# Patient Record
Sex: Female | Born: 1975 | Race: Black or African American | Hispanic: No | Smoking: Never smoker
Health system: Southern US, Community
[De-identification: ages and names within clinical notes are randomized; demographics above are authoritative.]

## PROBLEM LIST (undated history)

## (undated) ENCOUNTER — Inpatient Hospital Stay: Payer: Self-pay

## (undated) DIAGNOSIS — S8290XA Unspecified fracture of unspecified lower leg, initial encounter for closed fracture: Secondary | ICD-10-CM

## (undated) DIAGNOSIS — F4310 Post-traumatic stress disorder, unspecified: Secondary | ICD-10-CM

## (undated) DIAGNOSIS — O09529 Supervision of elderly multigravida, unspecified trimester: Secondary | ICD-10-CM

## (undated) DIAGNOSIS — I10 Essential (primary) hypertension: Secondary | ICD-10-CM

## (undated) DIAGNOSIS — M25512 Pain in left shoulder: Secondary | ICD-10-CM

## (undated) DIAGNOSIS — R519 Headache, unspecified: Secondary | ICD-10-CM

## (undated) DIAGNOSIS — K219 Gastro-esophageal reflux disease without esophagitis: Secondary | ICD-10-CM

## (undated) DIAGNOSIS — O1490 Unspecified pre-eclampsia, unspecified trimester: Secondary | ICD-10-CM

## (undated) DIAGNOSIS — Z9889 Other specified postprocedural states: Secondary | ICD-10-CM

## (undated) DIAGNOSIS — Z641 Problems related to multiparity: Secondary | ICD-10-CM

## (undated) DIAGNOSIS — F329 Major depressive disorder, single episode, unspecified: Secondary | ICD-10-CM

## (undated) DIAGNOSIS — M1990 Unspecified osteoarthritis, unspecified site: Secondary | ICD-10-CM

## (undated) DIAGNOSIS — S62109A Fracture of unspecified carpal bone, unspecified wrist, initial encounter for closed fracture: Secondary | ICD-10-CM

## (undated) DIAGNOSIS — I471 Supraventricular tachycardia, unspecified: Secondary | ICD-10-CM

## (undated) DIAGNOSIS — E119 Type 2 diabetes mellitus without complications: Secondary | ICD-10-CM

## (undated) DIAGNOSIS — J45909 Unspecified asthma, uncomplicated: Secondary | ICD-10-CM

## (undated) DIAGNOSIS — F32A Depression, unspecified: Secondary | ICD-10-CM

## (undated) DIAGNOSIS — R51 Headache: Secondary | ICD-10-CM

## (undated) DIAGNOSIS — R7303 Prediabetes: Secondary | ICD-10-CM

## (undated) HISTORY — PX: CHOLECYSTECTOMY: SHX55

## (undated) HISTORY — DX: Fracture of unspecified carpal bone, unspecified wrist, initial encounter for closed fracture: S62.109A

## (undated) HISTORY — DX: Supervision of elderly multigravida, unspecified trimester: O09.529

## (undated) HISTORY — DX: Supraventricular tachycardia, unspecified: I47.10

## (undated) HISTORY — DX: Unspecified fracture of unspecified lower leg, initial encounter for closed fracture: S82.90XA

## (undated) HISTORY — DX: Supraventricular tachycardia: I47.1

## (undated) HISTORY — PX: TONSILLECTOMY: SUR1361

## (undated) HISTORY — PX: ROTATOR CUFF REPAIR: SHX139

## (undated) HISTORY — DX: Morbid (severe) obesity due to excess calories: E66.01

## (undated) HISTORY — DX: Gastro-esophageal reflux disease without esophagitis: K21.9

## (undated) HISTORY — DX: Post-traumatic stress disorder, unspecified: F43.10

## (undated) HISTORY — DX: Other specified postprocedural states: Z98.890

## (undated) HISTORY — DX: Problems related to multiparity: Z64.1

## (undated) HISTORY — DX: Unspecified pre-eclampsia, unspecified trimester: O14.90

## (undated) HISTORY — PX: KNEE SURGERY: SHX244

## (undated) HISTORY — DX: Unspecified asthma, uncomplicated: J45.909

---
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
## 2000-03-03 DIAGNOSIS — K219 Gastro-esophageal reflux disease without esophagitis: Secondary | ICD-10-CM | POA: Insufficient documentation

## 2009-09-07 ENCOUNTER — Emergency Department: Payer: Self-pay | Admitting: Emergency Medicine

## 2010-01-04 ENCOUNTER — Encounter: Payer: Self-pay | Admitting: Obstetrics and Gynecology

## 2010-01-10 ENCOUNTER — Encounter: Payer: Self-pay | Admitting: Obstetrics and Gynecology

## 2010-02-01 ENCOUNTER — Encounter: Payer: Self-pay | Admitting: Obstetrics & Gynecology

## 2010-03-25 ENCOUNTER — Encounter: Payer: Self-pay | Admitting: Maternal & Fetal Medicine

## 2010-05-08 ENCOUNTER — Observation Stay: Payer: Self-pay | Admitting: Unknown Physician Specialty

## 2010-05-09 ENCOUNTER — Inpatient Hospital Stay: Payer: Self-pay

## 2010-05-23 ENCOUNTER — Emergency Department: Payer: Self-pay | Admitting: Emergency Medicine

## 2011-07-30 ENCOUNTER — Emergency Department: Payer: Self-pay | Admitting: Emergency Medicine

## 2012-01-20 ENCOUNTER — Emergency Department: Payer: Self-pay | Admitting: Emergency Medicine

## 2012-01-20 LAB — COMPREHENSIVE METABOLIC PANEL
Anion Gap: 7 (ref 7–16)
BUN: 14 mg/dL (ref 7–18)
Bilirubin,Total: 0.2 mg/dL (ref 0.2–1.0)
Calcium, Total: 8.9 mg/dL (ref 8.5–10.1)
Co2: 24 mmol/L (ref 21–32)
EGFR (African American): >60
EGFR (Non-African Amer.): >60
Glucose: 101 mg/dL — ABNORMAL HIGH (ref 65–99)
SGOT(AST): 21 U/L (ref 15–37)
SGPT (ALT): 20 U/L
Sodium: 142 mmol/L (ref 136–145)

## 2012-01-20 LAB — CK TOTAL AND CKMB (NOT AT ARMC)
CK, Total: 119 U/L (ref 21–215)
CK-MB: <0.5 ng/mL — ABNORMAL LOW (ref 0.5–3.6)

## 2012-01-20 LAB — CBC
MCH: 25.3 pg — ABNORMAL LOW (ref 26.0–34.0)
MCHC: 31.5 g/dL — ABNORMAL LOW (ref 32.0–36.0)
MCV: 80 fL (ref 80–100)
RDW: 15.8 % — ABNORMAL HIGH (ref 11.5–14.5)
WBC: 11.2 10*3/uL — ABNORMAL HIGH (ref 3.6–11.0)

## 2012-01-20 LAB — PRO B NATRIURETIC PEPTIDE: B-Type Natriuretic Peptide: 59 pg/mL (ref 0–125)

## 2012-03-02 ENCOUNTER — Emergency Department: Payer: Self-pay | Admitting: Emergency Medicine

## 2012-03-02 LAB — TROPONIN I: Troponin-I: <0.02 ng/mL

## 2012-03-02 LAB — CBC
HCT: 38.1 % (ref 35.0–47.0)
HGB: 11.8 g/dL — ABNORMAL LOW (ref 12.0–16.0)
MCH: 25.2 pg — ABNORMAL LOW (ref 26.0–34.0)
WBC: 17.2 10*3/uL — ABNORMAL HIGH (ref 3.6–11.0)

## 2012-03-02 LAB — COMPREHENSIVE METABOLIC PANEL
Albumin: 3.3 g/dL — ABNORMAL LOW (ref 3.4–5.0)
Alkaline Phosphatase: 88 U/L (ref 50–136)
Calcium, Total: 9.1 mg/dL (ref 8.5–10.1)
Co2: 24 mmol/L (ref 21–32)
Creatinine: 1.04 mg/dL (ref 0.60–1.30)
EGFR (African American): >60
EGFR (Non-African Amer.): >60
Glucose: 130 mg/dL — ABNORMAL HIGH (ref 65–99)
Potassium: 3.8 mmol/L (ref 3.5–5.1)
SGPT (ALT): 16 U/L
Sodium: 141 mmol/L (ref 136–145)

## 2012-04-05 ENCOUNTER — Emergency Department: Payer: Self-pay | Admitting: Emergency Medicine

## 2012-05-10 ENCOUNTER — Emergency Department: Payer: Self-pay | Admitting: Emergency Medicine

## 2012-05-11 LAB — CBC WITH DIFFERENTIAL/PLATELET
Basophil #: 0.1 10*3/uL (ref 0.0–0.1)
Basophil %: 0.9 %
Eosinophil #: 0.3 10*3/uL (ref 0.0–0.7)
HGB: 12.1 g/dL (ref 12.0–16.0)
Lymphocyte #: 3.9 10*3/uL — ABNORMAL HIGH (ref 1.0–3.6)
Lymphocyte %: 30.8 %
MCH: 25.4 pg — ABNORMAL LOW (ref 26.0–34.0)
MCHC: 31.2 g/dL — ABNORMAL LOW (ref 32.0–36.0)
Monocyte #: 0.8 x10 3/mm (ref 0.2–0.9)
Neutrophil %: 59.6 %
Platelet: 385 10*3/uL (ref 150–440)
RDW: 16.1 % — ABNORMAL HIGH (ref 11.5–14.5)
WBC: 12.6 10*3/uL — ABNORMAL HIGH (ref 3.6–11.0)

## 2012-05-11 LAB — COMPREHENSIVE METABOLIC PANEL
Albumin: 3.5 g/dL (ref 3.4–5.0)
Anion Gap: 9 (ref 7–16)
BUN: 13 mg/dL (ref 7–18)
Bilirubin,Total: 0.2 mg/dL (ref 0.2–1.0)
Creatinine: 0.74 mg/dL (ref 0.60–1.30)
EGFR (African American): >60
Glucose: 84 mg/dL (ref 65–99)
Osmolality: 282 (ref 275–301)
SGOT(AST): 23 U/L (ref 15–37)
SGPT (ALT): 17 U/L (ref 12–78)
Total Protein: 7.8 g/dL (ref 6.4–8.2)

## 2012-05-12 LAB — BETA STREP CULTURE(ARMC)

## 2012-06-07 ENCOUNTER — Emergency Department: Payer: Self-pay | Admitting: Emergency Medicine

## 2012-06-07 LAB — URINALYSIS, COMPLETE
Bilirubin,UR: NEGATIVE
Glucose,UR: NEGATIVE mg/dL (ref 0–75)
Ketone: NEGATIVE
Nitrite: NEGATIVE
Protein: NEGATIVE
RBC,UR: 1 /HPF (ref 0–5)
Specific Gravity: 1.001 (ref 1.003–1.030)
WBC UR: 2 /HPF (ref 0–5)

## 2012-06-07 LAB — PREGNANCY, URINE: Pregnancy Test, Urine: NEGATIVE m[IU]/mL

## 2012-06-14 ENCOUNTER — Ambulatory Visit: Payer: Self-pay | Admitting: Emergency Medicine

## 2012-06-14 LAB — HEPATIC FUNCTION PANEL A (ARMC)
Alkaline Phosphatase: 73 U/L (ref 50–136)
Bilirubin, Direct: <0.1 mg/dL (ref 0.00–0.20)
Bilirubin,Total: 0.3 mg/dL (ref 0.2–1.0)
SGOT(AST): 15 U/L (ref 15–37)
SGPT (ALT): 19 U/L (ref 12–78)

## 2013-08-06 ENCOUNTER — Ambulatory Visit: Payer: Self-pay | Admitting: Otolaryngology

## 2013-08-13 ENCOUNTER — Ambulatory Visit: Payer: Self-pay | Admitting: Otolaryngology

## 2013-12-08 ENCOUNTER — Emergency Department: Payer: Self-pay | Admitting: Emergency Medicine

## 2013-12-08 LAB — BASIC METABOLIC PANEL
Anion Gap: 5 — ABNORMAL LOW (ref 7–16)
BUN: 16 mg/dL (ref 7–18)
CALCIUM: 8.9 mg/dL (ref 8.5–10.1)
CO2: 24 mmol/L (ref 21–32)
Chloride: 113 mmol/L — ABNORMAL HIGH (ref 98–107)
Creatinine: 1.01 mg/dL (ref 0.60–1.30)
EGFR (African American): >60
EGFR (Non-African Amer.): >60
GLUCOSE: 84 mg/dL (ref 65–99)
OSMOLALITY: 284 (ref 275–301)
POTASSIUM: 3.9 mmol/L (ref 3.5–5.1)
SODIUM: 142 mmol/L (ref 136–145)

## 2013-12-08 LAB — CBC
HCT: 41.5 % (ref 35.0–47.0)
HGB: 13.2 g/dL (ref 12.0–16.0)
MCH: 27.4 pg (ref 26.0–34.0)
MCHC: 31.8 g/dL — ABNORMAL LOW (ref 32.0–36.0)
MCV: 86 fL (ref 80–100)
Platelet: 386 10*3/uL (ref 150–440)
RBC: 4.82 10*6/uL (ref 3.80–5.20)
RDW: 14.8 % — ABNORMAL HIGH (ref 11.5–14.5)
WBC: 15.7 10*3/uL — ABNORMAL HIGH (ref 3.6–11.0)

## 2013-12-08 LAB — TROPONIN I: Troponin-I: 0.04 ng/mL

## 2013-12-08 LAB — D-DIMER(ARMC): D-Dimer: 256 ng/ml

## 2013-12-09 LAB — TROPONIN I: Troponin-I: 0.03 ng/mL

## 2014-03-31 ENCOUNTER — Ambulatory Visit: Payer: Self-pay | Admitting: Physical Medicine and Rehabilitation

## 2014-04-25 ENCOUNTER — Emergency Department: Payer: Self-pay | Admitting: Emergency Medicine

## 2014-04-25 LAB — BASIC METABOLIC PANEL
Anion Gap: 10 (ref 7–16)
BUN: 11 mg/dL (ref 7–18)
CHLORIDE: 107 mmol/L (ref 98–107)
Calcium, Total: 8.5 mg/dL (ref 8.5–10.1)
Co2: 26 mmol/L (ref 21–32)
Creatinine: 0.77 mg/dL (ref 0.60–1.30)
EGFR (African American): >60
GLUCOSE: 90 mg/dL (ref 65–99)
Osmolality: 284 (ref 275–301)
Potassium: 3.6 mmol/L (ref 3.5–5.1)
Sodium: 143 mmol/L (ref 136–145)

## 2014-04-25 LAB — CBC WITH DIFFERENTIAL/PLATELET
Basophil #: 0 10*3/uL (ref 0.0–0.1)
Basophil %: 0.3 %
EOS PCT: 0.9 %
Eosinophil #: 0.1 10*3/uL (ref 0.0–0.7)
HCT: 38.4 % (ref 35.0–47.0)
HGB: 12.4 g/dL (ref 12.0–16.0)
LYMPHS ABS: 4.3 10*3/uL — AB (ref 1.0–3.6)
Lymphocyte %: 34.6 %
MCH: 28 pg (ref 26.0–34.0)
MCHC: 32.4 g/dL (ref 32.0–36.0)
MCV: 87 fL (ref 80–100)
MONO ABS: 0.7 x10 3/mm (ref 0.2–0.9)
Monocyte %: 6 %
NEUTROS PCT: 58.2 %
Neutrophil #: 7.2 10*3/uL — ABNORMAL HIGH (ref 1.4–6.5)
Platelet: 361 10*3/uL (ref 150–440)
RBC: 4.43 10*6/uL (ref 3.80–5.20)
RDW: 14.8 % — ABNORMAL HIGH (ref 11.5–14.5)
WBC: 12.4 10*3/uL — ABNORMAL HIGH (ref 3.6–11.0)

## 2014-04-25 LAB — TROPONIN I: Troponin-I: <0.02 ng/mL

## 2014-05-22 DIAGNOSIS — R52 Pain, unspecified: Secondary | ICD-10-CM

## 2014-05-22 DIAGNOSIS — G8929 Other chronic pain: Secondary | ICD-10-CM | POA: Insufficient documentation

## 2014-05-22 DIAGNOSIS — R51 Headache: Secondary | ICD-10-CM

## 2014-05-22 DIAGNOSIS — R0683 Snoring: Secondary | ICD-10-CM | POA: Insufficient documentation

## 2014-05-22 DIAGNOSIS — R002 Palpitations: Secondary | ICD-10-CM | POA: Insufficient documentation

## 2014-05-22 DIAGNOSIS — R519 Headache, unspecified: Secondary | ICD-10-CM | POA: Insufficient documentation

## 2014-07-11 DIAGNOSIS — I10 Essential (primary) hypertension: Secondary | ICD-10-CM | POA: Insufficient documentation

## 2014-07-18 DIAGNOSIS — M25562 Pain in left knee: Secondary | ICD-10-CM

## 2014-07-18 DIAGNOSIS — Z79891 Long term (current) use of opiate analgesic: Secondary | ICD-10-CM | POA: Insufficient documentation

## 2014-07-18 DIAGNOSIS — M25561 Pain in right knee: Secondary | ICD-10-CM

## 2014-07-18 DIAGNOSIS — F1190 Opioid use, unspecified, uncomplicated: Secondary | ICD-10-CM | POA: Insufficient documentation

## 2014-07-18 DIAGNOSIS — G8929 Other chronic pain: Secondary | ICD-10-CM | POA: Insufficient documentation

## 2014-11-13 DIAGNOSIS — G8929 Other chronic pain: Secondary | ICD-10-CM | POA: Insufficient documentation

## 2014-11-13 DIAGNOSIS — M25511 Pain in right shoulder: Secondary | ICD-10-CM

## 2014-12-01 DIAGNOSIS — M797 Fibromyalgia: Secondary | ICD-10-CM | POA: Insufficient documentation

## 2014-12-01 DIAGNOSIS — K219 Gastro-esophageal reflux disease without esophagitis: Secondary | ICD-10-CM | POA: Insufficient documentation

## 2014-12-01 DIAGNOSIS — N393 Stress incontinence (female) (male): Secondary | ICD-10-CM | POA: Insufficient documentation

## 2014-12-01 DIAGNOSIS — F319 Bipolar disorder, unspecified: Secondary | ICD-10-CM | POA: Insufficient documentation

## 2014-12-01 DIAGNOSIS — F32A Depression, unspecified: Secondary | ICD-10-CM | POA: Insufficient documentation

## 2014-12-01 DIAGNOSIS — F4310 Post-traumatic stress disorder, unspecified: Secondary | ICD-10-CM | POA: Insufficient documentation

## 2014-12-01 DIAGNOSIS — F329 Major depressive disorder, single episode, unspecified: Secondary | ICD-10-CM | POA: Insufficient documentation

## 2014-12-30 NOTE — Op Note (Signed)
PATIENT NAME:  Lauren [name], Lauren [name] MR#:  [id number] DATE OF BIRTH:  19-Jul-1976  DATE OF PROCEDURE:  06/14/2012  PREOPERATIVE DIAGNOSIS: Acute cholecystitis.  POSTOPERATIVE DIAGNOSIS: Acute cholecystitis.  PROCEDURE: Laparoscopic cholecystectomy.   SURGEON: Vella Kohler, MD  HISTORY: This patient was seen by me in my office. She is a very obese black woman and was having right upper quadrant abdominal pain. Preop liver function were normal. The patient had three large stones in the gallbladder. The patient was then brought to surgery.   DESCRIPTION OF PROCEDURE: Under general anesthesia, the abdomen was then prepped and draped. The patient was quite obese so it was difficult to find the fascia. Finally we were able to find the fascia and opened an incision and entered the abdomen with my fingers. After that a Hassan trocar was inserted. After that the camera was put in and another trocar was done in the epigastric region, two 5 mm in the right upper quadrant of the abdomen. The gallbladder was visualized. The patient had a thickened gallbladder. The patient did have a very large liver. It was a difficult dissection but the gallbladder was then lifted up above the liver. The cystic artery was isolated and it was then clipped and cut. The cystic duct was isolated and clipped and cut. The gallbladder was removed from the liver bed. After that there was some oozing from the lymph node on the cystic duct which I put a clip on in the end. I put two Surgicel pieces in to stop the oozing from the liver bed because there was really no peritoneum between the gallbladder and the liver, but final check up with irrigation showed there was almost no bleeding. After the irrigation was good, there was no evidence of any biliary leak or more bleeding. The other trocar was then removed from the abdomen and the gallbladder was also removed with a bag through the umbilical port. The patient tolerated the procedure well. After  this I sutured the         fascia with an extra 0 Vicryl suture and then Marcaine was injected in the skin and skin staples were applied. The patient tolerated the procedure well and was sent to the recovery room in satisfactory condition.  ____________________________ Welford Roche Phylis Bougie, MD msh:slb D: 06/14/2012 14:26:41 ET T: 06/14/2012 14:46:54 ET JOB#: [id number]  cc: Alexus Galka S. Phylis Bougie, MD, <Dictator> Sharene Butters MD ELECTRONICALLY SIGNED 06/28/2012 9:04

## 2015-03-20 DIAGNOSIS — Z3202 Encounter for pregnancy test, result negative: Secondary | ICD-10-CM | POA: Diagnosis not present

## 2015-03-20 DIAGNOSIS — N760 Acute vaginitis: Secondary | ICD-10-CM | POA: Insufficient documentation

## 2015-03-20 DIAGNOSIS — G8929 Other chronic pain: Secondary | ICD-10-CM | POA: Diagnosis not present

## 2015-03-20 DIAGNOSIS — R1030 Lower abdominal pain, unspecified: Secondary | ICD-10-CM | POA: Diagnosis present

## 2015-03-20 LAB — POCT PREGNANCY, URINE: Preg Test, Ur: NEGATIVE

## 2015-03-20 NOTE — ED Notes (Signed)
Patient reports s/s began 2 days ago - reports lower abd cramping, lower back pain and nausea.

## 2015-03-21 ENCOUNTER — Emergency Department
Admission: EM | Admit: 2015-03-21 | Discharge: 2015-03-21 | Disposition: A | Discharge status: Home / Self Care | Payer: Medicaid Other | Attending: Emergency Medicine | Admitting: Emergency Medicine

## 2015-03-21 ENCOUNTER — Emergency Department: Payer: Medicaid Other

## 2015-03-21 DIAGNOSIS — N760 Acute vaginitis: Secondary | ICD-10-CM

## 2015-03-21 DIAGNOSIS — B9689 Other specified bacterial agents as the cause of diseases classified elsewhere: Secondary | ICD-10-CM

## 2015-03-21 DIAGNOSIS — R102 Pelvic and perineal pain: Secondary | ICD-10-CM

## 2015-03-21 LAB — URINALYSIS COMPLETE WITH MICROSCOPIC (ARMC ONLY)
Bilirubin Urine: NEGATIVE
Glucose, UA: NEGATIVE mg/dL
Hgb urine dipstick: NEGATIVE
Leukocytes, UA: NEGATIVE
NITRITE: NEGATIVE
PH: 5 (ref 5.0–8.0)
PROTEIN: NEGATIVE mg/dL
SPECIFIC GRAVITY, URINE: 1.03 (ref 1.005–1.030)

## 2015-03-21 LAB — CHLAMYDIA/NGC RT PCR (ARMC ONLY)
CHLAMYDIA TR: NOT DETECTED
N GONORRHOEAE: NOT DETECTED

## 2015-03-21 LAB — COMPREHENSIVE METABOLIC PANEL
ALBUMIN: 3.5 g/dL (ref 3.5–5.0)
ALK PHOS: 47 U/L (ref 38–126)
ALT: 11 U/L — ABNORMAL LOW (ref 14–54)
ANION GAP: 6 (ref 5–15)
AST: 16 U/L (ref 15–41)
BUN: 12 mg/dL (ref 6–20)
CALCIUM: 8.9 mg/dL (ref 8.9–10.3)
CO2: 26 mmol/L (ref 22–32)
CREATININE: 0.92 mg/dL (ref 0.44–1.00)
Chloride: 108 mmol/L (ref 101–111)
GFR calc non Af Amer: >60 mL/min (ref >60)
GLUCOSE: 102 mg/dL — AB (ref 65–99)
Potassium: 3.6 mmol/L (ref 3.5–5.1)
Sodium: 140 mmol/L (ref 135–145)
Total Bilirubin: 0.3 mg/dL (ref 0.3–1.2)
Total Protein: 7.6 g/dL (ref 6.5–8.1)

## 2015-03-21 LAB — WET PREP, GENITAL
Trich, Wet Prep: NEGATIVE — AB
YEAST WET PREP: NEGATIVE — AB

## 2015-03-21 LAB — CBC WITH DIFFERENTIAL/PLATELET
BASOS ABS: 0 10*3/uL (ref 0–0.1)
BASOS PCT: 0 %
EOS PCT: 1 %
Eosinophils Absolute: 0.1 10*3/uL (ref 0–0.7)
HEMATOCRIT: 36.7 % (ref 35.0–47.0)
Hemoglobin: 11.8 g/dL — ABNORMAL LOW (ref 12.0–16.0)
LYMPHS PCT: 34 %
Lymphs Abs: 4.2 10*3/uL — ABNORMAL HIGH (ref 1.0–3.6)
MCH: 27.1 pg (ref 26.0–34.0)
MCHC: 32.1 g/dL (ref 32.0–36.0)
MCV: 84.5 fL (ref 80.0–100.0)
Monocytes Absolute: 0.6 10*3/uL (ref 0.2–0.9)
Monocytes Relative: 5 %
Neutro Abs: 7.3 10*3/uL — ABNORMAL HIGH (ref 1.4–6.5)
Neutrophils Relative %: 60 %
Platelets: 362 10*3/uL (ref 150–440)
RBC: 4.34 MIL/uL (ref 3.80–5.20)
RDW: 16.8 % — AB (ref 11.5–14.5)
WBC: 12.4 10*3/uL — ABNORMAL HIGH (ref 3.6–11.0)

## 2015-03-21 LAB — PREGNANCY, URINE: PREG TEST UR: NEGATIVE

## 2015-03-21 MED ORDER — METRONIDAZOLE 500 MG PO TABS
ORAL_TABLET | ORAL | Status: AC
  Filled 2015-03-21: qty 1

## 2015-03-21 MED ORDER — ACETAMINOPHEN 500 MG PO TABS
1000.0000 mg | ORAL_TABLET | Freq: Once | ORAL | Status: AC
  Administered 2015-03-21: 1000 mg via ORAL

## 2015-03-21 MED ORDER — METRONIDAZOLE 500 MG PO TABS: 500.0000 mg | ORAL_TABLET | Freq: Two times a day (BID) | ORAL | Status: AC

## 2015-03-21 MED ORDER — METRONIDAZOLE 500 MG PO TABS
500.0000 mg | ORAL_TABLET | Freq: Once | ORAL | Status: AC
  Administered 2015-03-21: 500 mg via ORAL

## 2015-03-21 MED ORDER — ACETAMINOPHEN 500 MG PO TABS
ORAL_TABLET | ORAL | Status: AC
  Filled 2015-03-21: qty 2

## 2015-03-21 NOTE — ED Notes (Signed)
Pt complains of bilateral lower pelvic pain radiating to bilateral lower back. Pt states has had sore bilateral breasts, frequent urination and is concerened she is pregnant. Pt states has had intermittent generalized headache. Pt states pain to pelvis is intermittent and at times is worse with m ovement and is associated with nausea, no vomiting.

## 2015-03-21 NOTE — ED Notes (Signed)
Pt returned from ultrasound. Pt requesting pain medication. md notified, no new orders received.

## 2015-03-21 NOTE — ED Provider Notes (Signed)
[facility] Emergency Department Provider Note  ____________________________________________  Time seen: Approximately 210 AM  I have reviewed the triage vital signs and the nursing notes.   HISTORY  Chief Complaint Abdominal Pain    HPI Lauren [name] is a 39 y.o. female history of chronic pain who presents today with lower abdominal pain for the past 2-3 days. She says that her pain is worse on the left near her pelvis and feels the same as when she had an ectopic pregnancy. She is a G8 P 7. She said she needed surgery for ectopic but still has both her tubes and ovaries.Pain is cramping and worsened with walking. Denies any vaginal bleeding or discharge. Denies any history of sexually transmitted diseases. Says that she normally has a regular period but has not had a period since early May. Says that she also feels some wants in her breast and had an episode of vomiting earlier today. Also having mild diarrhea. No blood noted in her vomit or stool.   No past medical history on file.  There are no active problems to display for this patient.   No past surgical history on file.  No current outpatient prescriptions on file.  Allergies Nucynta  No family history on file.  Social History History  Substance Use Topics  . Smoking status: Not on file  . Smokeless tobacco: Not on file  . Alcohol Use: Not on file    Review of Systems Constitutional: No fever/chills Eyes: No visual changes. ENT: No sore throat. Cardiovascular: Denies chest pain. Respiratory: Denies shortness of breath. Gastrointestinal:   No constipation. Genitourinary: Negative for dysuria. Musculoskeletal: Negative for back pain. Skin: Negative for rash. Neurological: Negative for headaches, focal weakness or numbness.  10-point ROS otherwise negative.  ____________________________________________   PHYSICAL EXAM:  VITAL SIGNS: ED Triage Vitals  Enc Vitals Group     BP  03/20/15 2339 118/64 mmHg     Pulse Rate 03/20/15 2339 82     Resp 03/20/15 2339 20     Temp 03/20/15 2339 98.7 F (37.1 C)     Temp Source 03/20/15 2339 Oral     SpO2 03/20/15 2339 100 %     Weight 03/20/15 2339 280 lb (127.007 kg)     Height 03/20/15 2339 5\' 5"  (1.651 m)     Head Cir --      Peak Flow --      Pain Score 03/20/15 2340 8     Pain Loc --      Pain Edu? --      Excl. in [place]? --     Constitutional: Alert and oriented. Well appearing and in no acute distress. Eyes: Conjunctivae are normal. PERRL. EOMI. Head: Atraumatic. Nose: No congestion/rhinnorhea. Mouth/Throat: Mucous membranes are moist.  Oropharynx non-erythematous. Neck: No stridor.   Cardiovascular: Normal rate, regular rhythm. Grossly normal heart sounds.  Good peripheral circulation. Respiratory: Normal respiratory effort.  No retractions. Lungs CTAB. Gastrointestinal: Soft with mild tenderness to the right and left suprapubic regions. There is no rebound or guarding. No distention. No abdominal bruits. No CVA tenderness. No tenderness over McBurney's point. Genitourinary: Normal external exam. Speculum exam with small amount of white discharge. Bimanual exam with no CMT. No uterine tenderness but with mild left adnexal tenderness. No masses palpated. Musculoskeletal: No lower extremity tenderness nor edema.  No joint effusions. Neurologic:  Normal speech and language. No gross focal neurologic deficits are appreciated. Speech is normal. No gait instability. Skin:  Skin is warm, dry and intact. No rash noted. Psychiatric: Mood and affect are normal. Speech and behavior are normal.  ____________________________________________   LABS (all labs ordered are listed, but only abnormal results are displayed)  Labs Reviewed  WET PREP, GENITAL - Abnormal; Notable for the following:    Yeast Wet Prep HPF POC NEGATIVE (*)    Trich, Wet Prep NEGATIVE (*)    Clue Cells Wet Prep HPF POC MODERATE (*)    WBC, Wet Prep  HPF POC FEW (*)    All other components within normal limits  CBC WITH DIFFERENTIAL/PLATELET - Abnormal; Notable for the following:    WBC 12.4 (*)    Hemoglobin 11.8 (*)    RDW 16.8 (*)    Neutro Abs 7.3 (*)    Lymphs Abs 4.2 (*)    All other components within normal limits  COMPREHENSIVE METABOLIC PANEL - Abnormal; Notable for the following:    Glucose, Bld 102 (*)    ALT 11 (*)    All other components within normal limits  URINALYSIS COMPLETEWITH MICROSCOPIC ([facility] ONLY) - Abnormal; Notable for the following:    Color, Urine YELLOW (*)    APPearance CLEAR (*)    Ketones, ur TRACE (*)    Bacteria, UA RARE (*)    Squamous Epithelial / LPF 6-30 (*)    All other components within normal limits  CHLAMYDIA/NGC RT PCR ([facility] ONLY)  PREGNANCY, URINE  POC URINE PREG, ED  POCT PREGNANCY, URINE   ____________________________________________  EKG   ____________________________________________  RADIOLOGY  Ultrasound is normal although limited because of posterior positioning of the ovaries. ____________________________________________   PROCEDURES    ____________________________________________   INITIAL IMPRESSION / ASSESSMENT AND PLAN / ED COURSE  Pertinent labs & imaging results that were available during my care of the patient were reviewed by me and considered in my medical decision making (see chart for details).  ----------------------------------------- 5:07 AM on 03/21/2015 -----------------------------------------  Patient is asleep and resting comfortably when and to the room. Easily awakened and discussed labs as well as imaging results. Baseline right blood cell count for the patient.  Will treat For bacterial vaginosis. No vomiting in the emergency department. No distress or outward signs of pain. ____________________________________________   FINAL CLINICAL IMPRESSION(S) / ED DIAGNOSES  Final diagnoses:  Pelvic pain in female  Pelvic pain  in female   bacterial vaginosis.    Orbie Pyo, MD 03/21/15 [phone]

## 2015-03-21 NOTE — ED Notes (Signed)
Patient transported to Ultrasound 

## 2015-03-21 NOTE — Discharge Instructions (Signed)
Bacterial Vaginosis Bacterial vaginosis is an infection of the vagina. It happens when too many of certain germs (bacteria) grow in the vagina. HOME CARE  Take your medicine as told by your doctor.  Finish your medicine even if you start to feel better.  Do not have sex until you finish your medicine and are better.  Tell your sex partner that you have an infection. They should see their doctor for treatment.  Practice safe sex. Use condoms. Have only one sex partner. GET HELP IF:  You are not getting better after 3 days of treatment.  You have more grey fluid (discharge) coming from your vagina than before.  You have more pain than before.  You have a fever. MAKE SURE YOU:   Understand these instructions.  Will watch your condition.  Will get help right away if you are not doing well or get worse. Document Released: 06/07/2008 Document Revised: 06/19/2013 Document Reviewed: 04/10/2013 [facility] Patient Information 2015 [place], [place]. This information is not intended to replace advice given to you by your health care provider. Make sure you discuss any questions you have with your health care provider.  Pelvic Pain Female pelvic pain can be caused by many different things and start from a variety of places. Pelvic pain refers to pain that is located in the lower half of the abdomen and between your hips. The pain may occur over a short period of time (acute) or may be reoccurring (chronic). The cause of pelvic pain may be related to disorders affecting the female reproductive organs (gynecologic), but it may also be related to the bladder, kidney stones, an intestinal complication, or muscle or skeletal problems. Getting help right away for pelvic pain is important, especially if there has been severe, sharp, or a sudden onset of unusual pain. It is also important to get help right away because some types of pelvic pain can be life threatening.  CAUSES  Below are only some of the  causes of pelvic pain. The causes of pelvic pain can be in one of several categories.   Gynecologic.  Pelvic inflammatory disease.  Sexually transmitted infection.  Ovarian cyst or a twisted ovarian ligament (ovarian torsion).  Uterine lining that grows outside the uterus (endometriosis).  Fibroids, cysts, or tumors.  Ovulation.  Pregnancy.  Pregnancy that occurs outside the uterus (ectopic pregnancy).  Miscarriage.  Labor.  Abruption of the placenta or ruptured uterus.  Infection.  Uterine infection (endometritis).  Bladder infection.  Diverticulitis.  Miscarriage related to a uterine infection (septic abortion).  Bladder.  Inflammation of the bladder (cystitis).  Kidney stone(s).  Gastrointestinal.  Constipation.  Diverticulitis.  Neurologic.  Trauma.  Feeling pelvic pain because of mental or emotional causes (psychosomatic).  Cancers of the bowel or pelvis. EVALUATION  Your caregiver will want to take a careful history of your concerns. This includes recent changes in your health, a careful gynecologic history of your periods (menses), and a sexual history. Obtaining your family history and medical history is also important. Your caregiver may suggest a pelvic exam. A pelvic exam will help identify the location and severity of the pain. It also helps in the evaluation of which organ system may be involved. In order to identify the cause of the pelvic pain and be properly treated, your caregiver may order tests. These tests may include:   A pregnancy test.  Pelvic ultrasonography.  An X-ray exam of the abdomen.  A urinalysis or evaluation of vaginal discharge.  Blood tests. HOME  CARE INSTRUCTIONS   Only take over-the-counter or prescription medicines for pain, discomfort, or fever as directed by your caregiver.   Rest as directed by your caregiver.   Eat a balanced diet.   Drink enough fluids to make your urine clear or pale yellow, or  as directed.   Avoid sexual intercourse if it causes pain.   Apply warm or cold compresses to the lower abdomen depending on which one helps the pain.   Avoid stressful situations.   Keep a journal of your pelvic pain. Write down when it started, where the pain is located, and if there are things that seem to be associated with the pain, such as food or your menstrual cycle.  Follow up with your caregiver as directed.  SEEK MEDICAL CARE IF:  Your medicine does not help your pain.  You have abnormal vaginal discharge. SEEK IMMEDIATE MEDICAL CARE IF:   You have heavy bleeding from the vagina.   Your pelvic pain increases.   You feel light-headed or faint.   You have chills.   You have pain with urination or blood in your urine.   You have uncontrolled diarrhea or vomiting.   You have a fever or persistent symptoms for more than 3 days.  You have a fever and your symptoms suddenly get worse.   You are being physically or sexually abused.  MAKE SURE YOU:  Understand these instructions.  Will watch your condition.  Will get help if you are not doing well or get worse. Document Released: 07/26/2004 Document Revised: 01/13/2014 Document Reviewed: 12/19/2011 [facility] Patient Information 2015 [place], [place]. This information is not intended to replace advice given to you by your health care provider. Make sure you discuss any questions you have with your health care provider.

## 2015-03-21 NOTE — ED Notes (Signed)
Dr. Clearnce Hasten in to speak to pt.

## 2015-05-07 ENCOUNTER — Encounter: Payer: Self-pay | Admitting: Emergency Medicine

## 2015-05-07 ENCOUNTER — Emergency Department
Admission: EM | Admit: 2015-05-07 | Discharge: 2015-05-07 | Disposition: A | Discharge status: Home / Self Care | Payer: Medicaid Other | Attending: Emergency Medicine | Admitting: Emergency Medicine

## 2015-05-07 DIAGNOSIS — Z79899 Other long term (current) drug therapy: Secondary | ICD-10-CM | POA: Insufficient documentation

## 2015-05-07 DIAGNOSIS — I10 Essential (primary) hypertension: Secondary | ICD-10-CM | POA: Diagnosis not present

## 2015-05-07 DIAGNOSIS — F4321 Adjustment disorder with depressed mood: Secondary | ICD-10-CM | POA: Diagnosis not present

## 2015-05-07 DIAGNOSIS — R42 Dizziness and giddiness: Secondary | ICD-10-CM | POA: Diagnosis present

## 2015-05-07 HISTORY — DX: Essential (primary) hypertension: I10

## 2015-05-07 MED ORDER — LORAZEPAM 1 MG PO TABS
1.0000 mg | ORAL_TABLET | Freq: Once | ORAL | Status: AC
  Administered 2015-05-07: 1 mg via ORAL
  Filled 2015-05-07: qty 1

## 2015-05-07 MED ORDER — IBUPROFEN 600 MG PO TABS
600.0000 mg | ORAL_TABLET | Freq: Once | ORAL | Status: AC
  Administered 2015-05-07: 600 mg via ORAL
  Filled 2015-05-07: qty 1

## 2015-05-07 NOTE — ED Provider Notes (Signed)
[facility] Emergency Department Provider Note  ____________________________________________  Time seen: 1:00 AM  I have reviewed the triage vital signs and the nursing notes.   HISTORY  Chief Complaint Headache; Insomnia; and Dizziness     HPI Lauren [name] is a 39 y.o. female presents with headache dizziness and chest discomfort inability to taste food, not sleeping well feeling hopeless since last Wednesday when she broke up with her boyfriend of 7 months. Patient denies any suicidal or homicidal ideation. Patient states her chest discomfort happens when she cries.    Past Medical History  Diagnosis Date  . Hypertension   . Tachycardia     There are no active problems to display for this patient.   Past Surgical History  Procedure Laterality Date  . Rotator cuff repair    . Knee surgery      Current Outpatient Rx  Name  Route  Sig  Dispense  Refill  . ALPRAZolam (XANAX) 1 MG tablet   Oral   Take 1 tablet by mouth 2 (two) times daily.         Marland Kitchen oxyCODONE-acetaminophen (PERCOCET/ROXICET) 5-325 MG per tablet   Oral   Take 1 tablet by mouth every 8 (eight) hours.         . celecoxib (CELEBREX) 100 MG capsule   Oral   Take 100 mg by mouth 2 (two) times daily.         Marland Kitchen lidocaine (LIDODERM) 5 %   Transdermal   Place 1 patch onto the skin daily.         Marland Kitchen lisinopril-hydrochlorothiazide (PRINZIDE,ZESTORETIC) 20-25 MG per tablet   Oral   Take 1 tablet by mouth daily.           Allergies Nucynta  No family history on file.  Social History Social History  Substance Use Topics  . Smoking status: Never Smoker   . Smokeless tobacco: Never Used  . Alcohol Use: Yes    Review of Systems  Constitutional: Negative for fever. Eyes: Negative for visual changes. ENT: Negative for sore throat. Cardiovascular: Negative for chest pain. Respiratory: Negative for shortness of breath. Gastrointestinal: Negative for abdominal  pain, vomiting and diarrhea. Genitourinary: Negative for dysuria. Musculoskeletal: Negative for back pain. Skin: Negative for rash. Neurological: Negative for headaches, focal weakness or numbness. Psychiatric: Depressed mood  10-point ROS otherwise negative.  ____________________________________________   PHYSICAL EXAM:  VITAL SIGNS: ED Triage Vitals  Enc Vitals Group     BP 05/07/15 0041 110/65 mmHg     Pulse Rate 05/07/15 0041 88     Resp 05/07/15 0041 18     Temp 05/07/15 0041 98.5 F (36.9 C)     Temp Source 05/07/15 0041 Oral     SpO2 05/07/15 0041 93 %     Weight 05/07/15 0041 278 lb (126.1 kg)     Height 05/07/15 0041 5\' 4"  (1.626 m)     Head Cir --      Peak Flow --      Pain Score 05/07/15 0042 8     Pain Loc --      Pain Edu? --      Excl. in [place]? --      Constitutional: Alert and oriented. Well appearing and in no distress. Eyes: Conjunctivae are normal. PERRL. Normal extraocular movements. ENT   Head: Normocephalic and atraumatic.   Nose: No congestion/rhinnorhea.   Mouth/Throat: Mucous membranes are moist.   Neck: No stridor. Hematological/Lymphatic/Immunilogical: No cervical lymphadenopathy.  Cardiovascular: Normal rate, regular rhythm. Normal and symmetric distal pulses are present in all extremities. No murmurs, rubs, or gallops. Respiratory: Normal respiratory effort without tachypnea nor retractions. Breath sounds are clear and equal bilaterally. No wheezes/rales/rhonchi. Gastrointestinal: Soft and nontender. No distention. There is no CVA tenderness. Genitourinary: deferred Musculoskeletal: Nontender with normal range of motion in all extremities. No joint effusions.  No lower extremity tenderness nor edema. Neurologic:  Normal speech and language. No gross focal neurologic deficits are appreciated. Speech is normal.  Skin:  Skin is warm, dry and intact. No rash noted. Psychiatric: Mood and affect are normal. Speech and behavior are  normal. Patient exhibits appropriate insight and judgment.  ____________________________________________      INITIAL IMPRESSION / ASSESSMENT AND PLAN / ED COURSE  Pertinent labs & imaging results that were available during my care of the patient were reviewed by me and considered in my medical decision making (see chart for details).   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

## 2015-05-07 NOTE — Discharge Instructions (Signed)
Adjustment Disorder °Most changes in life can cause stress. Getting used to changes may take a few months or longer. If feelings of stress, hopelessness, or worry continue, you may have an adjustment disorder. This stress-related mental health problem may affect your feelings, thinking and how you act. It occurs in both sexes and happens at any age. °SYMPTOMS  °Some of the following problems may be seen and vary from person to person: °· Sadness or depression. °· Loss of enjoyment. °· Thoughts of suicide. °· Fighting. °· Avoiding family and friends. °· Poor school performance. °· Hopelessness, sense of loss. °· Trouble sleeping. °· Vandalism. °· Worry, weight loss or gain. °· Crying spells. °· Anxiety °· Reckless driving. °· Skipping school. °· Poor work performance. °· Nervousness. °· Ignoring bills. °· Poor attitude. °DIAGNOSIS  °Your caregiver will ask what has happened in your life and do a physical exam. They will make a diagnosis of an adjustment disorder when they are sure another problem or medical illness causing your feelings does not exist. °TREATMENT  °When problems caused by stress interfere with you daily life or last longer than a few months, you may need counseling for an adjustment disorder. Early treatment may diminish problems and help you to better cope with the stressful events in your life. Sometimes medication is necessary. Individual counseling and or support groups can be very helpful. °PROGNOSIS  °Adjustment disorders usually last less than 3 to 6 months. The condition may persist if there is long lasting stress. This could include health problems, relationship problems, or job difficulties where you can not easily escape from what is causing the problem. °PREVENTION  °Even the most mentally healthy, highly functioning people can suffer from an adjustment disorder given a significant blow from a life-changing event. There is no way to prevent pain and loss. Most people need help from time  to time. You are not alone. °SEEK MEDICAL CARE IF:  °Your feelings or symptoms listed above do not improve or worsen. °Document Released: 05/03/2006 Document Revised: 11/21/2011 Document Reviewed: 07/25/2007 °[employer]® Patient Information ©2015 [employer]. This information is not intended to replace advice given to you by your health care provider. Make sure you discuss any questions you have with your health care provider. ° °

## 2015-05-07 NOTE — ED Notes (Addendum)
Pt presents to ED with dizziness, headache, and chest pressure when taking a deep breathing since "earlier in the day" yesterday. Pt states she has been unable to sleep or eat anything and she feels like her mouth is dry. Pt alert and calm at this time with no increased work of breathing or acute distress noted at this time. Pt reports she has been "going through a lot" since last Friday when she and her boyfriend broke up. Symptoms started at that time but have been getting progressively worse. Pt currently alert and calm with no increased work of breathing or acute distress noted at this time. Pt states today she has been feeling hopeless; flat affect currently.

## 2015-06-04 ENCOUNTER — Emergency Department: Payer: Medicaid Other

## 2015-06-04 ENCOUNTER — Encounter: Payer: Self-pay | Admitting: *Deleted

## 2015-06-04 ENCOUNTER — Emergency Department
Admission: EM | Admit: 2015-06-04 | Discharge: 2015-06-04 | Disposition: A | Discharge status: Home / Self Care | Payer: Medicaid Other | Attending: Emergency Medicine | Admitting: Emergency Medicine

## 2015-06-04 DIAGNOSIS — Y9289 Other specified places as the place of occurrence of the external cause: Secondary | ICD-10-CM | POA: Diagnosis not present

## 2015-06-04 DIAGNOSIS — Y9389 Activity, other specified: Secondary | ICD-10-CM | POA: Insufficient documentation

## 2015-06-04 DIAGNOSIS — Z792 Long term (current) use of antibiotics: Secondary | ICD-10-CM | POA: Diagnosis not present

## 2015-06-04 DIAGNOSIS — Y998 Other external cause status: Secondary | ICD-10-CM | POA: Insufficient documentation

## 2015-06-04 DIAGNOSIS — S93602A Unspecified sprain of left foot, initial encounter: Secondary | ICD-10-CM | POA: Diagnosis not present

## 2015-06-04 DIAGNOSIS — Z79899 Other long term (current) drug therapy: Secondary | ICD-10-CM | POA: Diagnosis not present

## 2015-06-04 DIAGNOSIS — Z791 Long term (current) use of non-steroidal anti-inflammatories (NSAID): Secondary | ICD-10-CM | POA: Diagnosis not present

## 2015-06-04 DIAGNOSIS — I10 Essential (primary) hypertension: Secondary | ICD-10-CM | POA: Diagnosis not present

## 2015-06-04 DIAGNOSIS — M79672 Pain in left foot: Secondary | ICD-10-CM | POA: Diagnosis present

## 2015-06-04 DIAGNOSIS — X58XXXA Exposure to other specified factors, initial encounter: Secondary | ICD-10-CM | POA: Insufficient documentation

## 2015-06-04 MED ORDER — IBUPROFEN 800 MG PO TABS
800.0000 mg | ORAL_TABLET | Freq: Once | ORAL | Status: AC
  Administered 2015-06-04: 800 mg via ORAL
  Filled 2015-06-04: qty 1

## 2015-06-04 MED ORDER — IBUPROFEN 800 MG PO TABS: 800.0000 mg | ORAL_TABLET | Freq: Three times a day (TID) | ORAL | Status: DC | PRN

## 2015-06-04 MED ORDER — HYDROCODONE-ACETAMINOPHEN 5-325 MG PO TABS: 1.0000 | ORAL_TABLET | ORAL | Status: DC | PRN

## 2015-06-04 MED ORDER — HYDROCODONE-ACETAMINOPHEN 5-325 MG PO TABS
1.0000 | ORAL_TABLET | Freq: Once | ORAL | Status: AC
  Administered 2015-06-04: 1 via ORAL
  Filled 2015-06-04: qty 1

## 2015-06-04 NOTE — Discharge Instructions (Signed)
Foot Sprain The muscles and cord like structures which attach muscle to bone (tendons) that surround the feet are made up of units. A foot sprain can occur at the weakest spot in any of these units. This condition is most often caused by injury to or overuse of the foot, as from playing contact sports, or aggravating a previous injury, or from poor conditioning, or obesity. SYMPTOMS  Pain with movement of the foot.  Tenderness and swelling at the injury site.  Loss of strength is present in moderate or severe sprains. THE THREE GRADES OR SEVERITY OF FOOT SPRAIN ARE:  Mild (Grade I): Slightly pulled muscle without tearing of muscle or tendon fibers or loss of strength.  Moderate (Grade II): Tearing of fibers in a muscle, tendon, or at the attachment to bone, with small decrease in strength.  Severe (Grade III): Rupture of the muscle-tendon-bone attachment, with separation of fibers. Severe sprain requires surgical repair. Often repeating (chronic) sprains are caused by overuse. Sudden (acute) sprains are caused by direct injury or over-use. DIAGNOSIS  Diagnosis of this condition is usually by your own observation. If problems continue, a caregiver may be required for further evaluation and treatment. X-rays may be required to make sure there are not breaks in the bones (fractures) present. Continued problems may require physical therapy for treatment. PREVENTION  Use strength and conditioning exercises appropriate for your sport.  Warm up properly prior to working out.  Use athletic shoes that are made for the sport you are participating in.  Allow adequate time for healing. Early return to activities makes repeat injury more likely, and can lead to an unstable arthritic foot that can result in prolonged disability. Mild sprains generally heal in 3 to 10 days, with moderate and severe sprains taking 2 to 10 weeks. Your caregiver can help you determine the proper time required for  healing. HOME CARE INSTRUCTIONS   Apply ice to the injury for 15-20 minutes, 03-04 times per day. Put the ice in a plastic bag and place a towel between the bag of ice and your skin.  An elastic wrap (like an Ace bandage) may be used to keep swelling down.  Keep foot above the level of the heart, or at least raised on a footstool, when swelling and pain are present.  Try to avoid use other than gentle range of motion while the foot is painful. Do not resume use until instructed by your caregiver. Then begin use gradually, not increasing use to the point of pain. If pain does develop, decrease use and continue the above measures, gradually increasing activities that do not cause discomfort, until you gradually achieve normal use.  Use crutches if and as instructed, and for the length of time instructed.  Keep injured foot and ankle wrapped between treatments.  Massage foot and ankle for comfort and to keep swelling down. Massage from the toes up towards the knee.  Only take over-the-counter or prescription medicines for pain, discomfort, or fever as directed by your caregiver. SEEK IMMEDIATE MEDICAL CARE IF:   Your pain and swelling increase, or pain is not controlled with medications.  You have loss of feeling in your foot or your foot turns cold or blue.  You develop new, unexplained symptoms, or an increase of the symptoms that brought you to your caregiver. MAKE SURE YOU:   Understand these instructions.  Will watch your condition.  Will get help right away if you are not doing well or get worse. Document Released:  02/18/2002 Document Revised: 11/21/2011 Document Reviewed: 04/17/2008 ExitCare Patient Information 2015 [place], [place]. This information is not intended to replace advice given to you by your health care provider. Make sure you discuss any questions you have with your health care provider.   Take pain medicine as directed. Rest the foot, elevate and use ice to the  area. Contact the orthopedist next week or a podiatrist if not improving. Return to the emergency room for any concerns.

## 2015-06-04 NOTE — ED Notes (Addendum)
Pt has left foot pain with swelling for 2 days.  No known injury.

## 2015-06-04 NOTE — ED Notes (Signed)
States the pain goes up her calf and drives every weekend to [place] and is worried about a blood clot.

## 2015-06-04 NOTE — ED Provider Notes (Signed)
[facility] Emergency Department [place] Note  ____________________________________________  Time seen: Approximately 10:17 PM  I have reviewed the triage vital signs and the nursing notes.   HISTORY  Chief Complaint Foot Pain    HPI Lauren [name] is a 39 y.o. female with history of hypertension who presents with 2 days of left foot pain. She also has some numbness and tingling. No reported injury. She has been wearing tennis shoes. She has noticed some swelling. Pain has radiated at times towards the calf but her Is not hurting currently. Pain is worse with walking. It is on the medial aspect of the left foot.   Past Medical History  Diagnosis Date  . Hypertension   . Tachycardia     There are no active problems to display for this patient.   Past Surgical History  Procedure Laterality Date  . Rotator cuff repair    . Knee surgery      Current Outpatient Rx  Name  Route  Sig  Dispense  Refill  . ALPRAZolam (XANAX) 1 MG tablet   Oral   Take 1 tablet by mouth 2 (two) times daily.         . celecoxib (CELEBREX) 100 MG capsule   Oral   Take 100 mg by mouth 2 (two) times daily.         Marland Kitchen HYDROcodone-acetaminophen (NORCO) 5-325 MG per tablet   Oral   Take 1 tablet by mouth every 4 (four) hours as needed for moderate pain.   20 tablet   0   . ibuprofen (ADVIL,MOTRIN) 800 MG tablet   Oral   Take 1 tablet (800 mg total) by mouth every 8 (eight) hours as needed.   15 tablet   0   . lidocaine (LIDODERM) 5 %   Transdermal   Place 1 patch onto the skin daily.         Marland Kitchen lisinopril-hydrochlorothiazide (PRINZIDE,ZESTORETIC) 20-25 MG per tablet   Oral   Take 1 tablet by mouth daily.         Marland Kitchen oxyCODONE-acetaminophen (PERCOCET/ROXICET) 5-325 MG per tablet   Oral   Take 1 tablet by mouth every 8 (eight) hours.           Allergies Nucynta  No family history on file.  Social History Social History  Substance Use Topics  .  Smoking status: Never Smoker   . Smokeless tobacco: Never Used  . Alcohol Use: No    Review of Systems Constitutional: No fever/chills Eyes: No visual changes. ENT: No sore throat. Cardiovascular: Denies chest pain. Respiratory: Denies shortness of breath. Musculoskeletal: Negative for back pain. Skin: Negative for rash. Neurological:  Has felt some numbness to the left foot and toes.  10-point ROS otherwise negative.  ____________________________________________   PHYSICAL EXAM:  VITAL SIGNS: ED Triage Vitals  Enc Vitals Group     BP 06/04/15 2106 130/50 mmHg     Pulse Rate 06/04/15 2106 83     Resp 06/04/15 2106 20     Temp 06/04/15 2106 98.8 F (37.1 C)     Temp Source 06/04/15 2106 Oral     SpO2 06/04/15 2106 99 %     Weight 06/04/15 2106 287 lb (130.182 kg)     Height 06/04/15 2106 5\' 5"  (1.651 m)     Head Cir --      Peak Flow --      Pain Score 06/04/15 2107 9     Pain Loc --  Pain Edu? --      Excl. in [place]? --      Constitutional: Alert and oriented. Well appearing and in no acute distress. Eyes: Conjunctivae are normal. PERRL. EOMI. Head: Atraumatic. Nose: No congestion/rhinnorhea. Mouth/Throat: Mucous membranes are moist.  Oropharynx non-erythematous. Neck: supple Musculoskeletal: Left foot: No swelling. 2+ DP pulse. Tender over the medial foot and great toe. Also tender to the arch and calcaneus. Tender with range of motion of the foot. Minimal focal joint tenderness. Neurologic:  Normal speech and language. No gross focal neurologic deficits are appreciated. No gait instability. Skin:  Skin is warm, dry and intact. No rash noted. Psychiatric: Mood and affect are normal. Speech and behavior are normal.  ____________________________________________   LABS (all labs ordered are listed, but only abnormal results are displayed)  Labs Reviewed - No data to  display ____________________________________________  EKG   ____________________________________________  RADIOLOGY  Study Result     CLINICAL DATA: Medial LEFT foot pain with swelling for 2 days, no known injury  EXAM: LEFT FOOT - COMPLETE 3+ VIEW  COMPARISON: None  FINDINGS: Osseous mineralization normal.  Joint spaces preserved.  No fracture, dislocation, or bone destruction.  IMPRESSION: Normal exam.   Electronically Signed  By: Lavonia Dana M.D.  On: 06/04/2015 21:28    ____________________________________________   PROCEDURES  Procedure(s) performed: None  Critical Care performed: No  ____________________________________________   INITIAL IMPRESSION / ASSESSMENT AND PLAN / ED COURSE  Pertinent labs & imaging results that were available during my care of the patient were reviewed by me and considered in my medical decision making (see chart for details).  39 year old female with 2 days of left foot pain consistent with a sprain. Negative x-ray as above. Treat with anti-inflammatories, ibuprofen and Norco as needed. She is placed in an Ace wrap, given crutches and encouraged to use ice to the foot. She may follow-up with orthopedist or podiatrist if not improving by next week. ____________________________________________   FINAL CLINICAL IMPRESSION(S) / ED DIAGNOSES  Final diagnoses:  Foot sprain, left, initial encounter      Mortimer Fries, PA-C 06/04/15 2221  Orbie Pyo, MD 06/04/15 [phone]

## 2015-06-24 ENCOUNTER — Encounter: Payer: Self-pay | Admitting: Emergency Medicine

## 2015-06-24 DIAGNOSIS — Z3A01 Less than 8 weeks gestation of pregnancy: Secondary | ICD-10-CM | POA: Insufficient documentation

## 2015-06-24 DIAGNOSIS — O2311 Infections of bladder in pregnancy, first trimester: Secondary | ICD-10-CM | POA: Insufficient documentation

## 2015-06-24 DIAGNOSIS — O10011 Pre-existing essential hypertension complicating pregnancy, first trimester: Secondary | ICD-10-CM | POA: Diagnosis not present

## 2015-06-24 DIAGNOSIS — Z791 Long term (current) use of non-steroidal anti-inflammatories (NSAID): Secondary | ICD-10-CM | POA: Diagnosis not present

## 2015-06-24 DIAGNOSIS — Z79899 Other long term (current) drug therapy: Secondary | ICD-10-CM | POA: Diagnosis not present

## 2015-06-24 DIAGNOSIS — O9989 Other specified diseases and conditions complicating pregnancy, childbirth and the puerperium: Secondary | ICD-10-CM | POA: Diagnosis present

## 2015-06-24 LAB — URINALYSIS COMPLETE WITH MICROSCOPIC (ARMC ONLY)
BILIRUBIN URINE: NEGATIVE
Glucose, UA: NEGATIVE mg/dL
HGB URINE DIPSTICK: NEGATIVE
KETONES UR: NEGATIVE mg/dL
Nitrite: POSITIVE — AB
PH: 5 (ref 5.0–8.0)
Protein, ur: NEGATIVE mg/dL
SPECIFIC GRAVITY, URINE: 1.028 (ref 1.005–1.030)

## 2015-06-24 LAB — CBC
HCT: 35.8 % (ref 35.0–47.0)
HEMOGLOBIN: 11.5 g/dL — AB (ref 12.0–16.0)
MCH: 26.6 pg (ref 26.0–34.0)
MCHC: 32.1 g/dL (ref 32.0–36.0)
MCV: 83.1 fL (ref 80.0–100.0)
PLATELETS: 334 10*3/uL (ref 150–440)
RBC: 4.32 MIL/uL (ref 3.80–5.20)
RDW: 16.3 % — ABNORMAL HIGH (ref 11.5–14.5)
WBC: 12.1 10*3/uL — AB (ref 3.6–11.0)

## 2015-06-24 LAB — HCG, QUANTITATIVE, PREGNANCY: hCG, Beta Chain, Quant, S: 704 m[IU]/mL — ABNORMAL HIGH (ref <5)

## 2015-06-24 NOTE — ED Notes (Addendum)
Patient ambulatory to triage with steady gait, without difficulty or distress noted; pt reports left lower abd pain tonight accomp by vag spotting; st positive home pregnancy test; G8 P7

## 2015-06-25 ENCOUNTER — Emergency Department
Admission: EM | Admit: 2015-06-25 | Discharge: 2015-06-25 | Disposition: A | Discharge status: Home / Self Care | Payer: Medicaid Other | Attending: Emergency Medicine | Admitting: Emergency Medicine

## 2015-06-25 ENCOUNTER — Emergency Department: Payer: Medicaid Other

## 2015-06-25 DIAGNOSIS — Z3491 Encounter for supervision of normal pregnancy, unspecified, first trimester: Secondary | ICD-10-CM

## 2015-06-25 DIAGNOSIS — N3000 Acute cystitis without hematuria: Secondary | ICD-10-CM

## 2015-06-25 MED ORDER — CEPHALEXIN 500 MG PO CAPS
500.0000 mg | ORAL_CAPSULE | Freq: Once | ORAL | Status: AC
  Administered 2015-06-25: 500 mg via ORAL
  Filled 2015-06-25: qty 1

## 2015-06-25 MED ORDER — CEPHALEXIN 500 MG PO CAPS: 500.0000 mg | ORAL_CAPSULE | Freq: Two times a day (BID) | ORAL | Status: AC

## 2015-06-25 NOTE — ED Provider Notes (Signed)
[facility] Emergency Department Provider Note  ____________________________________________  Time seen: 1:30 AM  I have reviewed the triage vital signs and the nursing notes.   HISTORY  Chief Complaint Abdominal Cramping    HPI Lauren [name] is a 39 y.o. female G9P7 1 ectopic pregnancy presents with left-sided abdominal pain with radiation to her back. Patient of note discovered she was pregnant today with a home pregnancy test. Patient admits to nausea and nonbloody emesis. Patient also admits to urinary frequency.    Past Medical History  Diagnosis Date  . Hypertension   . Tachycardia     There are no active problems to display for this patient.   Past Surgical History  Procedure Laterality Date  . Rotator cuff repair    . Knee surgery    . Cesarean section    . Cholecystectomy      Current Outpatient Rx  Name  Route  Sig  Dispense  Refill  . ALPRAZolam (XANAX) 1 MG tablet   Oral   Take 1 tablet by mouth 2 (two) times daily.         . celecoxib (CELEBREX) 100 MG capsule   Oral   Take 100 mg by mouth 2 (two) times daily.         Marland Kitchen HYDROcodone-acetaminophen (NORCO) 5-325 MG per tablet   Oral   Take 1 tablet by mouth every 4 (four) hours as needed for moderate pain.   20 tablet   0   . ibuprofen (ADVIL,MOTRIN) 800 MG tablet   Oral   Take 1 tablet (800 mg total) by mouth every 8 (eight) hours as needed.   15 tablet   0   . lidocaine (LIDODERM) 5 %   Transdermal   Place 1 patch onto the skin daily.         Marland Kitchen lisinopril-hydrochlorothiazide (PRINZIDE,ZESTORETIC) 20-25 MG per tablet   Oral   Take 1 tablet by mouth daily.         Marland Kitchen oxyCODONE-acetaminophen (PERCOCET/ROXICET) 5-325 MG per tablet   Oral   Take 1 tablet by mouth every 8 (eight) hours.           Allergies Nucynta  No family history on file.  Social History Social History  Substance Use Topics  . Smoking status: Never Smoker   . Smokeless  tobacco: Never Used  . Alcohol Use: No    Review of Systems  Constitutional: Negative for fever. Eyes: Negative for visual changes. ENT: Negative for sore throat. Cardiovascular: Negative for chest pain. Respiratory: Negative for shortness of breath. Gastrointestinal: Positive for abdominal pain, negative for vomiting and diarrhea. Positive for nausea Genitourinary: Negative for dysuria. Positive for urinary frequency  Musculoskeletal: Negative for back pain. Skin: Negative for rash. Neurological: Negative for headaches, focal weakness or numbness.   10-point ROS otherwise negative.  ____________________________________________   PHYSICAL EXAM:  VITAL SIGNS: ED Triage Vitals  Enc Vitals Group     BP 06/24/15 2219 163/86 mmHg     Pulse Rate 06/24/15 2219 99     Resp 06/24/15 2219 22     Temp 06/24/15 2219 97.6 F (36.4 C)     Temp Source 06/24/15 2219 Oral     SpO2 06/24/15 2219 100 %     Weight 06/24/15 2219 285 lb (129.275 kg)     Height 06/24/15 2219 5\' 5"  (1.651 m)     Head Cir --      Peak Flow --  Pain Score 06/24/15 2217 9     Pain Loc --      Pain Edu? --      Excl. in [place]? --      Constitutional: Alert and oriented. Well appearing and in no distress. Eyes: Conjunctivae are normal. PERRL. Normal extraocular movements. ENT   Head: Normocephalic and atraumatic.   Nose: No congestion/rhinnorhea.   Mouth/Throat: Mucous membranes are moist.   Neck: No stridor. Hematological/Lymphatic/Immunilogical: No cervical lymphadenopathy. Cardiovascular: Normal rate, regular rhythm. Normal and symmetric distal pulses are present in all extremities. No murmurs, rubs, or gallops. Respiratory: Normal respiratory effort without tachypnea nor retractions. Breath sounds are clear and equal bilaterally. No wheezes/rales/rhonchi. Gastrointestinal: Positive suprapubic and left lower quadrant pain with palpation.. No distention. There is no CVA  tenderness. Genitourinary: deferred Musculoskeletal: Nontender with normal range of motion in all extremities. No joint effusions.  No lower extremity tenderness nor edema. Neurologic:  Normal speech and language. No gross focal neurologic deficits are appreciated. Speech is normal.  Skin:  Skin is warm, dry and intact. No rash noted. Psychiatric: Mood and affect are normal. Speech and behavior are normal. Patient exhibits appropriate insight and judgment.  ____________________________________________    LABS (pertinent positives/negatives)  Labs Reviewed  HCG, QUANTITATIVE, PREGNANCY - Abnormal; Notable for the following:    hCG, Beta Chain, Quant, S 704 (*)    All other components within normal limits  CBC - Abnormal; Notable for the following:    WBC 12.1 (*)    Hemoglobin 11.5 (*)    RDW 16.3 (*)    All other components within normal limits  URINALYSIS COMPLETEWITH MICROSCOPIC ([facility] ONLY) - Abnormal; Notable for the following:    Color, Urine YELLOW (*)    APPearance HAZY (*)    Nitrite POSITIVE (*)    Leukocytes, UA 1+ (*)    Bacteria, UA MANY (*)    Squamous Epithelial / LPF 6-30 (*)    All other components within normal limits  ABO/RH       RADIOLOGY   US OB Comp Less 14 Wks (Final result) Result time: 06/25/15 01:38:23   Final result by Rad Results In Interface (06/25/15 01:38:23)   Narrative:   CLINICAL DATA: Left lower abdominal pain with spotting during pregnancy. Estimated gestational age by LMP is 4 weeks 0 days. Quantitative beta HCG is 704.  EXAM: OBSTETRIC <14 WK [place] AND TRANSVAGINAL OB US  TECHNIQUE: Both transabdominal and transvaginal ultrasound examinations were performed for complete evaluation of the gestation as well as the maternal uterus, adnexal regions, and pelvic cul-de-sac. Transvaginal technique was performed to assess early pregnancy.  COMPARISON: Ultrasound pelvis 03/21/2015  FINDINGS: Intrauterine gestational sac: No  intrauterine gestational sac identified.  Yolk sac: Not identified.  Embryo: Not identified.  Cardiac Activity: Not identified.  Maternal uterus/adnexae: Uterus is anteverted. No myometrial mass lesions identified. There is shadowing across the lower uterine segment which could be artifact of scanning or due to bowel gas. This could also indicate previous C-section scar. Correlation with patient's surgical history is recommended. Endometrial stripe is not abnormally thickened. Ovaries are not visualized but no abnormal adnexal masses are seen. No free fluid in the pelvis.  IMPRESSION: No intrauterine gestational sac, yolk sac, or fetal pole identified. Differential considerations include intrauterine pregnancy too early to be sonographically visualized, missed abortion, or ectopic pregnancy. Followup ultrasound is recommended in 10-14 days for further evaluation.   Electronically Signed By: Lucienne Capers M.D. On: 06/25/2015 01:38  US Ob Transvaginal (Final result) Result time: 06/25/15 01:38:23   Final result by Rad Results In Interface (06/25/15 01:38:23)   Narrative:   CLINICAL DATA: Left lower abdominal pain with spotting during pregnancy. Estimated gestational age by LMP is 4 weeks 0 days. Quantitative beta HCG is 704.  EXAM: OBSTETRIC <14 WK [place] AND TRANSVAGINAL OB US  TECHNIQUE: Both transabdominal and transvaginal ultrasound examinations were performed for complete evaluation of the gestation as well as the maternal uterus, adnexal regions, and pelvic cul-de-sac. Transvaginal technique was performed to assess early pregnancy.  COMPARISON: Ultrasound pelvis 03/21/2015  FINDINGS: Intrauterine gestational sac: No intrauterine gestational sac identified.  Yolk sac: Not identified.  Embryo: Not identified.  Cardiac Activity: Not identified.  Maternal uterus/adnexae: Uterus is anteverted. No myometrial mass lesions identified.  There is shadowing across the lower uterine segment which could be artifact of scanning or due to bowel gas. This could also indicate previous C-section scar. Correlation with patient's surgical history is recommended. Endometrial stripe is not abnormally thickened. Ovaries are not visualized but no abnormal adnexal masses are seen. No free fluid in the pelvis.  IMPRESSION: No intrauterine gestational sac, yolk sac, or fetal pole identified. Differential considerations include intrauterine pregnancy too early to be sonographically visualized, missed abortion, or ectopic pregnancy. Followup ultrasound is recommended in 10-14 days for further evaluation.   Electronically Signed By: Lucienne Capers M.D. On: 06/25/2015 01:38          INITIAL IMPRESSION / ASSESSMENT AND PLAN / ED COURSE  Pertinent labs & imaging results that were available during my care of the patient were reviewed by me and considered in my medical decision making (see chart for details).  History of physical exam consistent with early  pregnancy however given and ability to visualize the intrauterine gestational sac concerning for possible ectopic versus missed abortion is still likely a such patient is advised to follow-up with the OB/GYN approximately 2 days for further outpatient evaluation. Patient also noted to have a urinary tract infection during this emergency department visit for which she was prescribed Keflex.  ____________________________________________   FINAL CLINICAL IMPRESSION(S) / ED DIAGNOSES  Final diagnoses:  First trimester pregnancy  Acute cystitis without hematuria      Gregor Hams, MD 06/26/15 [phone]

## 2015-06-25 NOTE — Discharge Instructions (Signed)
First Trimester of Pregnancy  The first trimester of pregnancy is from week 1 until the end of week 12 (months 1 through 3). A week after a sperm fertilizes an egg, the egg will implant on the wall of the uterus. This embryo will begin to develop into a baby. Genes from you and your partner are forming the baby. The female genes determine whether the baby is a boy or a girl. At 6-8 weeks, the eyes and face are formed, and the heartbeat can be seen on ultrasound. At the end of 12 weeks, all the baby's organs are formed.   Now that you are pregnant, you will want to do everything you can to have a healthy baby. Two of the most important things are to get good prenatal care and to follow your health care provider's instructions. Prenatal care is all the medical care you receive before the baby's birth. This care will help prevent, find, and treat any problems during the pregnancy and childbirth.  BODY CHANGES  Your body goes through many changes during pregnancy. The changes vary from woman to woman.   · You may gain or lose a couple of pounds at first.  · You may feel sick to your stomach (nauseous) and throw up (vomit). If the vomiting is uncontrollable, call your health care provider.  · You may tire easily.  · You may develop headaches that can be relieved by medicines approved by your health care provider.  · You may urinate more often. Painful urination may mean you have a bladder infection.  · You may develop heartburn as a result of your pregnancy.  · You may develop constipation because certain hormones are causing the muscles that push waste through your intestines to slow down.  · You may develop hemorrhoids or swollen, bulging veins (varicose veins).  · Your breasts may begin to grow larger and become tender. Your nipples may stick out more, and the tissue that surrounds them (areola) may become darker.  · Your gums may bleed and may be sensitive to brushing and flossing.   · Dark spots or blotches (chloasma, mask of pregnancy) may develop on your face. This will likely fade after the baby is born.  · Your menstrual periods will stop.  · You may have a loss of appetite.  · You may develop cravings for certain kinds of food.  · You may have changes in your emotions from day to day, such as being excited to be pregnant or being concerned that something may go wrong with the pregnancy and baby.  · You may have more vivid and strange dreams.  · You may have changes in your hair. These can include thickening of your hair, rapid growth, and changes in texture. Some women also have hair loss during or after pregnancy, or hair that feels dry or thin. Your hair will most likely return to normal after your baby is born.  WHAT TO EXPECT AT YOUR PRENATAL VISITS  During a routine prenatal visit:  · You will be weighed to make sure you and the baby are growing normally.  · Your blood pressure will be taken.  · Your abdomen will be measured to track your baby's growth.  · The fetal heartbeat will be listened to starting around week 10 or 12 of your pregnancy.  · Test results from any previous visits will be discussed.  Your health care provider may ask you:  · How you are feeling.  · If you   including cigarettes, chewing tobacco, and electronic cigarettes.  If you have any questions. Other tests that may be performed during your first trimester include:  Blood tests to find your blood type and to check for the presence of any previous infections. They will also be used to check for low iron levels (anemia) and Rh antibodies. Later in the pregnancy, blood tests for diabetes will be done along with other tests if problems develop.  Urine tests to check for infections, diabetes, or protein in the urine.  An ultrasound to confirm the proper growth  and development of the baby.  An amniocentesis to check for possible genetic problems.  Fetal screens for spina bifida and Down syndrome.  You may need other tests to make sure you and the baby are doing well.  HIV (human immunodeficiency virus) testing. Routine prenatal testing includes screening for HIV, unless you choose not to have this test. HOME CARE INSTRUCTIONS  Medicines  Follow your health care provider's instructions regarding medicine use. Specific medicines may be either safe or unsafe to take during pregnancy.  Take your prenatal vitamins as directed.  If you develop constipation, try taking a stool softener if your health care provider approves. Diet  Eat regular, well-balanced meals. Choose a variety of foods, such as meat or vegetable-based protein, fish, milk and low-fat dairy products, vegetables, fruits, and whole grain breads and cereals. Your health care provider will help you determine the amount of weight gain that is right for you.  Avoid raw meat and uncooked cheese. These carry germs that can cause birth defects in the baby.  Eating four or five small meals rather than three large meals a day may help relieve nausea and vomiting. If you start to feel nauseous, eating a few soda crackers can be helpful. Drinking liquids between meals instead of during meals also seems to help nausea and vomiting.  If you develop constipation, eat more high-fiber foods, such as fresh vegetables or fruit and whole grains. Drink enough fluids to keep your urine clear or pale yellow. Activity and Exercise  Exercise only as directed by your health care provider. Exercising will help you:  Control your weight.  Stay in shape.  Be prepared for labor and delivery.  Experiencing pain or cramping in the lower abdomen or low back is a good sign that you should stop exercising. Check with your health care provider before continuing normal exercises.  Try to avoid standing for long  periods of time. Move your legs often if you must stand in one place for a long time.  Avoid heavy lifting.  Wear low-heeled shoes, and practice good posture.  You may continue to have sex unless your health care provider directs you otherwise. Relief of Pain or Discomfort  Wear a good support bra for breast tenderness.   Take warm sitz baths to soothe any pain or discomfort caused by hemorrhoids. Use hemorrhoid cream if your health care provider approves.   Rest with your legs elevated if you have leg cramps or low back pain.  If you develop varicose veins in your legs, wear support hose. Elevate your feet for 15 minutes, 3-4 times a day. Limit salt in your diet. Prenatal Care  Schedule your prenatal visits by the twelfth week of pregnancy. They are usually scheduled monthly at first, then more often in the last 2 months before delivery.  Write down your questions. Take them to your prenatal visits.  Keep all your prenatal visits as directed by your  health care provider. Safety  Wear your seat belt at all times when driving.  Make a list of emergency phone numbers, including numbers for family, friends, the hospital, and police and fire departments. General Tips  Ask your health care provider for a referral to a local prenatal education class. Begin classes no later than at the beginning of month 6 of your pregnancy.  Ask for help if you have counseling or nutritional needs during pregnancy. Your health care provider can offer advice or refer you to specialists for help with various needs.  Do not use hot tubs, steam rooms, or saunas.  Do not douche or use tampons or scented sanitary pads.  Do not cross your legs for long periods of time.  Avoid cat litter boxes and soil used by cats. These carry germs that can cause birth defects in the baby and possibly loss of the fetus by miscarriage or stillbirth.  Avoid all smoking, herbs, alcohol, and medicines not prescribed by  your health care provider. Chemicals in these affect the formation and growth of the baby.  Do not use any tobacco products, including cigarettes, chewing tobacco, and electronic cigarettes. If you need help quitting, ask your health care provider. You may receive counseling support and other resources to help you quit.  Schedule a dentist appointment. At home, brush your teeth with a soft toothbrush and be gentle when you floss. SEEK MEDICAL CARE IF:   You have dizziness.  You have mild pelvic cramps, pelvic pressure, or nagging pain in the abdominal area.  You have persistent nausea, vomiting, or diarrhea.  You have a bad smelling vaginal discharge.  You have pain with urination.  You notice increased swelling in your face, hands, legs, or ankles. SEEK IMMEDIATE MEDICAL CARE IF:   You have a fever.  You are leaking fluid from your vagina.  You have spotting or bleeding from your vagina.  You have severe abdominal cramping or pain.  You have rapid weight gain or loss.  You vomit blood or material that looks like coffee grounds.  You are exposed to [place] measles and have never had them.  You are exposed to fifth disease or chickenpox.  You develop a severe headache.  You have shortness of breath.  You have any kind of trauma, such as from a fall or a car accident.   This information is not intended to replace advice given to you by your health care provider. Make sure you discuss any questions you have with your health care provider.   Document Released: 08/23/2001 Document Revised: 09/19/2014 Document Reviewed: 07/09/2013 Elsevier Interactive Patient Education 2016 [employer]  Urinary Tract Infection Urinary tract infections (UTIs) can develop anywhere along your urinary tract. Your urinary tract is your body's drainage system for removing wastes and extra water. Your urinary tract includes two kidneys, two ureters, a bladder, and a urethra. Your kidneys are a  pair of bean-shaped organs. Each kidney is about the size of your fist. They are located below your ribs, one on each side of your spine. CAUSES Infections are caused by microbes, which are microscopic organisms, including fungi, viruses, and bacteria. These organisms are so small that they can only be seen through a microscope. Bacteria are the microbes that most commonly cause UTIs. SYMPTOMS  Symptoms of UTIs may vary by age and gender of the patient and by the location of the infection. Symptoms in young women typically include a frequent and intense urge to urinate and a painful,  burning feeling in the bladder or urethra during urination. Older women and men are more likely to be tired, shaky, and weak and have muscle aches and abdominal pain. A fever may mean the infection is in your kidneys. Other symptoms of a kidney infection include pain in your back or sides below the ribs, nausea, and vomiting. DIAGNOSIS To diagnose a UTI, your caregiver will ask you about your symptoms. Your caregiver will also ask you to provide a urine sample. The urine sample will be tested for bacteria and white blood cells. White blood cells are made by your body to help fight infection. TREATMENT  Typically, UTIs can be treated with medication. Because most UTIs are caused by a bacterial infection, they usually can be treated with the use of antibiotics. The choice of antibiotic and length of treatment depend on your symptoms and the type of bacteria causing your infection. HOME CARE INSTRUCTIONS  If you were prescribed antibiotics, take them exactly as your caregiver instructs you. Finish the medication even if you feel better after you have only taken some of the medication.  Drink enough water and fluids to keep your urine clear or pale yellow.  Avoid caffeine, tea, and carbonated beverages. They tend to irritate your bladder.  Empty your bladder often. Avoid holding urine for long periods of time.  Empty your  bladder before and after sexual intercourse.  After a bowel movement, women should cleanse from front to back. Use each tissue only once. SEEK MEDICAL CARE IF:   You have back pain.  You develop a fever.  Your symptoms do not begin to resolve within 3 days. SEEK IMMEDIATE MEDICAL CARE IF:   You have severe back pain or lower abdominal pain.  You develop chills.  You have nausea or vomiting.  You have continued burning or discomfort with urination. MAKE SURE YOU:   Understand these instructions.  Will watch your condition.  Will get help right away if you are not doing well or get worse.   This information is not intended to replace advice given to you by your health care provider. Make sure you discuss any questions you have with your health care provider.   Document Released: 06/08/2005 Document Revised: 05/20/2015 Document Reviewed: 10/07/2011 Elsevier Interactive Patient Education [employer].

## 2015-06-25 NOTE — ED Notes (Signed)
Pt to ER for abdominal pain on left side, bilateral back pain in the kidney region. Pt took home pregnancy test prior to coming to ER and discovered that she is pregnant for the 9th time. Pt has 4 sons and 3 daughters, with one ectopic pregnancy.  Dr Owens Shark in room to discuss test results with pt. And plan of care

## 2015-07-07 ENCOUNTER — Encounter: Payer: Self-pay | Admitting: *Deleted

## 2015-07-07 ENCOUNTER — Emergency Department: Payer: Medicaid Other

## 2015-07-07 ENCOUNTER — Emergency Department
Admission: EM | Admit: 2015-07-07 | Discharge: 2015-07-08 | Disposition: A | Discharge status: Home / Self Care | Payer: Medicaid Other | Attending: Emergency Medicine | Admitting: Emergency Medicine

## 2015-07-07 DIAGNOSIS — Z3A01 Less than 8 weeks gestation of pregnancy: Secondary | ICD-10-CM | POA: Insufficient documentation

## 2015-07-07 DIAGNOSIS — I10 Essential (primary) hypertension: Secondary | ICD-10-CM | POA: Insufficient documentation

## 2015-07-07 DIAGNOSIS — Z792 Long term (current) use of antibiotics: Secondary | ICD-10-CM | POA: Diagnosis not present

## 2015-07-07 DIAGNOSIS — O9989 Other specified diseases and conditions complicating pregnancy, childbirth and the puerperium: Secondary | ICD-10-CM | POA: Diagnosis present

## 2015-07-07 DIAGNOSIS — Z79899 Other long term (current) drug therapy: Secondary | ICD-10-CM | POA: Insufficient documentation

## 2015-07-07 DIAGNOSIS — R1032 Left lower quadrant pain: Secondary | ICD-10-CM | POA: Insufficient documentation

## 2015-07-07 LAB — URINALYSIS COMPLETE WITH MICROSCOPIC (ARMC ONLY)
BILIRUBIN URINE: NEGATIVE
Bacteria, UA: NONE SEEN
GLUCOSE, UA: NEGATIVE mg/dL
HGB URINE DIPSTICK: NEGATIVE
KETONES UR: NEGATIVE mg/dL
NITRITE: NEGATIVE
PH: 5 (ref 5.0–8.0)
Protein, ur: NEGATIVE mg/dL
Specific Gravity, Urine: 1.027 (ref 1.005–1.030)

## 2015-07-07 LAB — CBC WITH DIFFERENTIAL/PLATELET
BASOS ABS: 0.1 10*3/uL (ref 0–0.1)
BASOS PCT: 1 %
EOS ABS: 0.1 10*3/uL (ref 0–0.7)
EOS PCT: 1 %
HCT: 36.3 % (ref 35.0–47.0)
Hemoglobin: 11.5 g/dL — ABNORMAL LOW (ref 12.0–16.0)
LYMPHS PCT: 29 %
Lymphs Abs: 3.8 10*3/uL — ABNORMAL HIGH (ref 1.0–3.6)
MCH: 26.5 pg (ref 26.0–34.0)
MCHC: 31.6 g/dL — ABNORMAL LOW (ref 32.0–36.0)
MCV: 83.7 fL (ref 80.0–100.0)
Monocytes Absolute: 0.9 10*3/uL (ref 0.2–0.9)
Monocytes Relative: 7 %
Neutro Abs: 8.2 10*3/uL — ABNORMAL HIGH (ref 1.4–6.5)
Neutrophils Relative %: 62 %
PLATELETS: 344 10*3/uL (ref 150–440)
RBC: 4.33 MIL/uL (ref 3.80–5.20)
RDW: 16.1 % — ABNORMAL HIGH (ref 11.5–14.5)
WBC: 13.1 10*3/uL — AB (ref 3.6–11.0)

## 2015-07-07 LAB — HCG, QUANTITATIVE, PREGNANCY: hCG, Beta Chain, Quant, S: 17285 m[IU]/mL — ABNORMAL HIGH (ref <5)

## 2015-07-07 LAB — BASIC METABOLIC PANEL
ANION GAP: 4 — AB (ref 5–15)
BUN: 14 mg/dL (ref 6–20)
CALCIUM: 8.5 mg/dL — AB (ref 8.9–10.3)
CO2: 24 mmol/L (ref 22–32)
Chloride: 108 mmol/L (ref 101–111)
Creatinine, Ser: 0.72 mg/dL (ref 0.44–1.00)
Glucose, Bld: 94 mg/dL (ref 65–99)
POTASSIUM: 3.8 mmol/L (ref 3.5–5.1)
SODIUM: 136 mmol/L (ref 135–145)

## 2015-07-07 LAB — POCT PREGNANCY, URINE: Preg Test, Ur: POSITIVE — AB

## 2015-07-07 NOTE — ED Provider Notes (Signed)
Time Seen: Approximately. ----------------------------------------- 10:36 PM on 07/07/2015 -----------------------------------------   I have reviewed the triage notes  Chief Complaint: Abdominal Pain   History of Present Illness: Lauren [name] is a 39 y.o. female who is gravida 47. 0 one previous tubal pregnancy. She states her tubal pregnancy was treated with methotrexate and she's not sure what side the pregnancy was. Patient has a known positive pregnancy and was here approximately 2 weeks ago. At that time her ultrasound did not show any interuterine pregnancy and she was advised follow-up with OB/GYN. She states the nearest appointment that she could establish was this coming Friday. She states she's had some lower abdominal cramping mainly in the left lower quadrant. Patient denies any vaginal discharge or bleeding. Patient denies any nausea vomiting or fever. She was diagnosed last time for with urinary frequency and prescribed antibiotics and for urinary tract infection which she states has improved.   Past Medical History  Diagnosis Date  . Hypertension   . Tachycardia     There are no active problems to display for this patient.   Past Surgical History  Procedure Laterality Date  . Rotator cuff repair    . Knee surgery    . Cesarean section    . Cholecystectomy      Past Surgical History  Procedure Laterality Date  . Rotator cuff repair    . Knee surgery    . Cesarean section    . Cholecystectomy      Current Outpatient Rx  Name  Route  Sig  Dispense  Refill  . ALPRAZolam (XANAX) 1 MG tablet   Oral   Take 1 tablet by mouth 2 (two) times daily.         . celecoxib (CELEBREX) 100 MG capsule   Oral   Take 100 mg by mouth 2 (two) times daily.         Marland Kitchen HYDROcodone-acetaminophen (NORCO) 5-325 MG per tablet   Oral   Take 1 tablet by mouth every 4 (four) hours as needed for moderate pain.   20 tablet   0   . ibuprofen (ADVIL,MOTRIN) 800 MG tablet    Oral   Take 1 tablet (800 mg total) by mouth every 8 (eight) hours as needed.   15 tablet   0   . lidocaine (LIDODERM) 5 %   Transdermal   Place 1 patch onto the skin daily.         Marland Kitchen lisinopril-hydrochlorothiazide (PRINZIDE,ZESTORETIC) 20-25 MG per tablet   Oral   Take 1 tablet by mouth daily.         Marland Kitchen oxyCODONE-acetaminophen (PERCOCET/ROXICET) 5-325 MG per tablet   Oral   Take 1 tablet by mouth every 8 (eight) hours.           Allergies:  Nucynta  Family History: No family history on file.  Social History: Social History  Substance Use Topics  . Smoking status: Never Smoker   . Smokeless tobacco: Never Used  . Alcohol Use: No     Review of Systems:   10 point review of systems was performed and was otherwise negative:  Constitutional: No fever Eyes: No visual disturbances ENT: No sore throat, ear pain Cardiac: No chest pain Respiratory: No shortness of breath, wheezing, or stridor Abdomen: No abdominal pain, no vomiting, No diarrhea Endocrine: No weight loss, No night sweats Extremities: No peripheral edema, cyanosis Skin: No rashes, easy bruising Neurologic: No focal weakness, trouble with speech or swollowing Urologic: No dysuria,  Hematuria, or urinary frequency   Physical Exam:  ED Triage Vitals  Enc Vitals Group     BP 07/07/15 2157 130/71 mmHg     Pulse Rate 07/07/15 2157 88     Resp 07/07/15 2157 20     Temp 07/07/15 2157 98.7 F (37.1 C)     Temp Source 07/07/15 2157 Oral     SpO2 07/07/15 2157 98 %     Weight 07/07/15 2157 287 lb (130.182 kg)     Height 07/07/15 2157 5\' 5"  (1.651 m)     Head Cir --      Peak Flow --      Pain Score 07/07/15 2158 7     Pain Loc --      Pain Edu? --      Excl. in [place]? --     General: Awake , Alert , and Oriented times 3; GCS 15 Head: Normal cephalic , atraumatic Eyes: Pupils equal , round, reactive to light Nose/Throat: No nasal drainage, patent upper airway without erythema or exudate.  Neck:  Supple, Full range of motion, No anterior adenopathy or palpable thyroid masses Lungs: Clear to ascultation without wheezes , rhonchi, or rales Heart: Regular rate, regular rhythm without murmurs , gallops , or rubs Abdomen: Soft, non tender without rebound, guarding , or rigidity; bowel sounds positive and symmetric in all 4 quadrants. No organomegaly .        Extremities: 2 plus symmetric pulses. No edema, clubbing or cyanosis Neurologic: normal ambulation, Motor symmetric without deficits, sensory intact Skin: warm, dry, no rashes Pelvic exam deferred for ultrasound evaluation  Labs:   All laboratory work was reviewed including any pertinent negatives or positives listed below:  Labs Reviewed  CBC WITH DIFFERENTIAL/PLATELET - Abnormal; Notable for the following:    WBC 13.1 (*)    Hemoglobin 11.5 (*)    MCHC 31.6 (*)    RDW 16.1 (*)    Neutro Abs 8.2 (*)    Lymphs Abs 3.8 (*)    All other components within normal limits  BASIC METABOLIC PANEL - Abnormal; Notable for the following:    Calcium 8.5 (*)    Anion gap 4 (*)    All other components within normal limits  URINALYSIS COMPLETEWITH MICROSCOPIC ([facility] ONLY) - Abnormal; Notable for the following:    Color, Urine YELLOW (*)    APPearance CLEAR (*)    Leukocytes, UA 1+ (*)    Squamous Epithelial / LPF 0-5 (*)    All other components within normal limits  POCT PREGNANCY, URINE - Abnormal; Notable for the following:    Preg Test, Ur POSITIVE (*)    All other components within normal limits  HCG, QUANTITATIVE, PREGNANCY  POC URINE PREG, ED   review laboratory work showed no significant abnormalities  Radiology:    EXAM: OBSTETRIC <14 WK [place] AND TRANSVAGINAL OB US  TECHNIQUE: Both transabdominal and transvaginal ultrasound examinations were performed for complete evaluation of the gestation as well as the maternal uterus, adnexal regions, and pelvic cul-de-sac. Transvaginal technique was performed to assess early  pregnancy.  COMPARISON: None.  FINDINGS: Intrauterine gestational sac: Visualized/normal in shape.  Yolk sac: Yes  Embryo: No  Cardiac Activity: N/A  MSD: 13.2 mm  6 w  1 d  Maternal uterus/adnexae: No subchorionic hemorrhage is noted. The uterus is otherwise unremarkable in appearance.  The ovaries are not characterized due to overlying bowel gas.  No free fluid is seen within the pelvic cul-de-sac.  IMPRESSION: Single intrauterine gestational sac noted, with a mean sac diameter of 1.3 cm, corresponding to a gestational age of [redacted] weeks 1 day. This matches the gestational age of [redacted] weeks 5 days by LMP, reflecting an estimated date of delivery of March 03, 2016. A yolk sac is visualized. The embryo is not yet seen, within normal limits given the size of the gestational sac.   Electronically Signed By: Garald Balding M.D. On: 07/07/2015 23:44    I personally reviewed the radiologic studies    ED Course: The patient's stay here was uneventful and her differential was primarily revolving around her first trimester pregnancy with left lower quadrant abdominal pain. Given the results of her ultrasound and her quantitative pregnancy of felt this was unlikely to be an ectopic pregnancy at this time. Patient's pregnancy appears to be proceeding normally. Patient was advised take Tylenol over-the-counter or return here if she has any other new concerns. Her urinalysis does not show any signs of infection at this time. She has a follow-up appointment later this week at the [facility] clinic and is been advised to keep that appointment.  Assessment:  Left lower quadrant abdominal pain with first trimester pregnancy     Plan:  Patient was advised to return immediately if condition worsens. Patient was advised to follow up with her primary care physician or other specialized physicians involved and in their current assessment. Outpatient  management            Daymon Larsen, MD 07/08/15 [phone]

## 2015-07-07 NOTE — ED Notes (Signed)
Family at bedside. 

## 2015-07-07 NOTE — ED Notes (Signed)
Pt reports low abd pain with cramping.  Pt is approx [redacted] weeks pregnant.  Has appt with [place] this week.  No vag bleeding.  No dysuria. Pt has lower back pain

## 2015-07-08 NOTE — ED Notes (Signed)
Pt left without discharge instructions.

## 2015-07-31 ENCOUNTER — Emergency Department
Admission: EM | Admit: 2015-07-31 | Discharge: 2015-07-31 | Disposition: A | Discharge status: Home / Self Care | Payer: Medicaid Other | Attending: Emergency Medicine | Admitting: Emergency Medicine

## 2015-07-31 ENCOUNTER — Emergency Department: Payer: Medicaid Other

## 2015-07-31 ENCOUNTER — Encounter: Payer: Self-pay | Admitting: Emergency Medicine

## 2015-07-31 DIAGNOSIS — Z3A01 Less than 8 weeks gestation of pregnancy: Secondary | ICD-10-CM | POA: Insufficient documentation

## 2015-07-31 DIAGNOSIS — O209 Hemorrhage in early pregnancy, unspecified: Secondary | ICD-10-CM | POA: Diagnosis present

## 2015-07-31 DIAGNOSIS — Z79899 Other long term (current) drug therapy: Secondary | ICD-10-CM | POA: Insufficient documentation

## 2015-07-31 DIAGNOSIS — O10011 Pre-existing essential hypertension complicating pregnancy, first trimester: Secondary | ICD-10-CM | POA: Insufficient documentation

## 2015-07-31 DIAGNOSIS — O039 Complete or unspecified spontaneous abortion without complication: Secondary | ICD-10-CM

## 2015-07-31 LAB — CBC
HCT: 31.8 % — ABNORMAL LOW (ref 35.0–47.0)
HEMOGLOBIN: 10.2 g/dL — AB (ref 12.0–16.0)
MCH: 27 pg (ref 26.0–34.0)
MCHC: 32.2 g/dL (ref 32.0–36.0)
MCV: 83.9 fL (ref 80.0–100.0)
Platelets: 341 10*3/uL (ref 150–440)
RBC: 3.79 MIL/uL — AB (ref 3.80–5.20)
RDW: 15.8 % — ABNORMAL HIGH (ref 11.5–14.5)
WBC: 11.3 10*3/uL — ABNORMAL HIGH (ref 3.6–11.0)

## 2015-07-31 LAB — HCG, QUANTITATIVE, PREGNANCY: hCG, Beta Chain, Quant, S: 2606 m[IU]/mL — ABNORMAL HIGH (ref <5)

## 2015-07-31 MED ORDER — OXYCODONE-ACETAMINOPHEN 5-325 MG PO TABS: 1.0000 | ORAL_TABLET | Freq: Once | ORAL | Status: DC

## 2015-07-31 MED ORDER — MORPHINE SULFATE (PF) 4 MG/ML IV SOLN
4.0000 mg | Freq: Once | INTRAVENOUS | Status: AC
  Administered 2015-07-31: 4 mg via INTRAVENOUS

## 2015-07-31 MED ORDER — ONDANSETRON HCL 4 MG/2ML IJ SOLN
INTRAMUSCULAR | Status: AC
  Administered 2015-07-31: 4 mg via INTRAVENOUS
  Filled 2015-07-31: qty 2

## 2015-07-31 MED ORDER — MORPHINE SULFATE (PF) 4 MG/ML IV SOLN
INTRAVENOUS | Status: AC
  Administered 2015-07-31: 4 mg via INTRAVENOUS
  Filled 2015-07-31: qty 1

## 2015-07-31 MED ORDER — HYDROMORPHONE HCL 1 MG/ML IJ SOLN
1.0000 mg | Freq: Once | INTRAMUSCULAR | Status: AC
  Administered 2015-07-31: 1 mg via INTRAVENOUS
  Filled 2015-07-31: qty 1

## 2015-07-31 MED ORDER — OXYCODONE-ACETAMINOPHEN 5-325 MG PO TABS
1.0000 | ORAL_TABLET | Freq: Once | ORAL | Status: AC
  Administered 2015-07-31: 1 via ORAL
  Filled 2015-07-31: qty 1

## 2015-07-31 MED ORDER — ONDANSETRON HCL 4 MG/2ML IJ SOLN
4.0000 mg | Freq: Once | INTRAMUSCULAR | Status: AC
  Administered 2015-07-31: 4 mg via INTRAVENOUS

## 2015-07-31 NOTE — ED Notes (Signed)
Pt given disposable underwear and sanitary pad per pt request

## 2015-07-31 NOTE — ED Notes (Signed)
Pt taken to US

## 2015-07-31 NOTE — Discharge Instructions (Signed)
Miscarriage  A miscarriage is the sudden loss of an unborn baby (fetus) before the 20th week of pregnancy. Most miscarriages happen in the first 3 months of pregnancy. Sometimes, it happens before a woman even knows she is pregnant. A miscarriage is also called a "spontaneous miscarriage" or "early pregnancy loss." Having a miscarriage can be an emotional experience. Talk with your caregiver about any questions you may have about miscarrying, the grieving process, and your future pregnancy plans.  CAUSES    Problems with the fetal chromosomes that make it impossible for the baby to develop normally. Problems with the baby's genes or chromosomes are most often the result of errors that occur, by chance, as the embryo divides and grows. The problems are not inherited from the parents.   Infection of the cervix or uterus.    Hormone problems.    Problems with the cervix, such as having an incompetent cervix. This is when the tissue in the cervix is not strong enough to hold the pregnancy.    Problems with the uterus, such as an abnormally shaped uterus, uterine fibroids, or congenital abnormalities.    Certain medical conditions.    Smoking, drinking alcohol, or taking illegal drugs.    Trauma.   Often, the cause of a miscarriage is unknown.   SYMPTOMS    Vaginal bleeding or spotting, with or without cramps or pain.   Pain or cramping in the abdomen or lower back.   Passing fluid, tissue, or blood clots from the vagina.  DIAGNOSIS   Your caregiver will perform a physical exam. You may also have an ultrasound to confirm the miscarriage. Blood or urine tests may also be ordered.  TREATMENT    Sometimes, treatment is not necessary if you naturally pass all the fetal tissue that was in the uterus. If some of the fetus or placenta remains in the body (incomplete miscarriage), tissue left behind may become infected and must be removed. Usually, a dilation and curettage (D and C) procedure is performed.  During a D and C procedure, the cervix is widened (dilated) and any remaining fetal or placental tissue is gently removed from the uterus.   Antibiotic medicines are prescribed if there is an infection. Other medicines may be given to reduce the size of the uterus (contract) if there is a lot of bleeding.   If you have Rh negative blood and your baby was Rh positive, you will need a Rh immunoglobulin shot. This shot will protect any future baby from having Rh blood problems in future pregnancies.  HOME CARE INSTRUCTIONS    Your caregiver may order bed rest or may allow you to continue light activity. Resume activity as directed by your caregiver.   Have someone help with home and family responsibilities during this time.    Keep track of the number of sanitary pads you use each day and how soaked (saturated) they are. Write down this information.    Do not use tampons. Do not douche or have sexual intercourse until approved by your caregiver.    Only take over-the-counter or prescription medicines for pain or discomfort as directed by your caregiver.    Do not take aspirin. Aspirin can cause bleeding.    Keep all follow-up appointments with your caregiver.    If you or your partner have problems with grieving, talk to your caregiver or seek counseling to help cope with the pregnancy loss. Allow enough time to grieve before trying to get pregnant again.     SEEK IMMEDIATE MEDICAL CARE IF:    You have severe cramps or pain in your back or abdomen.   You have a fever.   You pass large blood clots (walnut-sized or larger) ortissue from your vagina. Save any tissue for your caregiver to inspect.    Your bleeding increases.    You have a thick, bad-smelling vaginal discharge.   You become lightheaded, weak, or you faint.    You have chills.   MAKE SURE YOU:   Understand these instructions.   Will watch your condition.   Will get help right away if you are not doing well or get worse.     This  information is not intended to replace advice given to you by your health care provider. Make sure you discuss any questions you have with your health care provider.     Document Released: 02/22/2001 Document Revised: 12/24/2012 Document Reviewed: 10/18/2011  Elsevier Interactive Patient Education 2016 [employer]

## 2015-07-31 NOTE — ED Provider Notes (Signed)
[facility] Department Provider Note  ____________________________________________  Time seen: 2:25 AM  I have reviewed the triage vital signs and the nursing notes.   HISTORY  Chief Complaint Vaginal Bleeding      HPI Lauren [name] is a 39 y.o. female presents with 10 out of 10 pelvic pain and "heavy vaginal bleeding". Patient is a gravida 18 with one previous tubal pregnancy approximately [redacted] weeks pregnant at present. Patient states that she was seen by Dr. Star Age [facility] and informed that it was no cardiac activity and as such was given Cytotec periods patient states since yesterday she is noted large clots and worsening pain tonight with large clots and pelvic cramping    Past Medical History  Diagnosis Date  . Hypertension   . Tachycardia     There are no active problems to display for this patient.   Past Surgical History  Procedure Laterality Date  . Rotator cuff repair    . Knee surgery    . Cesarean section    . Cholecystectomy      Current Outpatient Rx  Name  Route  Sig  Dispense  Refill  . ALPRAZolam (XANAX) 1 MG tablet   Oral   Take 1 tablet by mouth 2 (two) times daily.         . celecoxib (CELEBREX) 100 MG capsule   Oral   Take 100 mg by mouth 2 (two) times daily.         Marland Kitchen HYDROcodone-acetaminophen (NORCO) 5-325 MG per tablet   Oral   Take 1 tablet by mouth every 4 (four) hours as needed for moderate pain.   20 tablet   0   . ibuprofen (ADVIL,MOTRIN) 800 MG tablet   Oral   Take 1 tablet (800 mg total) by mouth every 8 (eight) hours as needed.   15 tablet   0   . lidocaine (LIDODERM) 5 %   Transdermal   Place 1 patch onto the skin daily.         Marland Kitchen lisinopril-hydrochlorothiazide (PRINZIDE,ZESTORETIC) 20-25 MG per tablet   Oral   Take 1 tablet by mouth daily.         Marland Kitchen oxyCODONE-acetaminophen (PERCOCET/ROXICET) 5-325 MG per tablet   Oral   Take 1 tablet by mouth every 8 (eight) hours.            Allergies Nucynta  No family history on file.  Social History Social History  Substance Use Topics  . Smoking status: Never Smoker   . Smokeless tobacco: Never Used  . Alcohol Use: No    Review of Systems  Constitutional: Negative for fever. Eyes: Negative for visual changes. ENT: Negative for sore throat. Cardiovascular: Negative for chest pain. Respiratory: Negative for shortness of breath. Gastrointestinal: Negative for abdominal pain, vomiting and diarrhea. Genitourinary: Negative for dysuria. Positive for pelvic pain and vaginal bleeding Musculoskeletal: Negative for back pain. Skin: Negative for rash. Neurological: Negative for headaches, focal weakness or numbness.   10-point ROS otherwise negative.  ____________________________________________   PHYSICAL EXAM:  VITAL SIGNS: ED Triage Vitals  Enc Vitals Group     BP 07/31/15 0215 126/82 mmHg     Pulse Rate 07/31/15 0215 96     Resp 07/31/15 0215 18     Temp 07/31/15 0215 98.2 F (36.8 C)     Temp Source 07/31/15 0215 Oral     SpO2 07/31/15 0215 98 %     Weight 07/31/15 0215 292 lb (132.45 kg)  Height 07/31/15 0215 5\' 5"  (1.651 m)     Head Cir --      Peak Flow --      Pain Score 07/31/15 0215 10     Pain Loc --      Pain Edu? --      Excl. in [place]? --      Constitutional: Alert and oriented. Well appearing and in no distress. Eyes: Conjunctivae are normal. PERRL. Normal extraocular movements. ENT   Head: Normocephalic and atraumatic.   Nose: No congestion/rhinnorhea.   Mouth/Throat: Mucous membranes are moist.   Neck: No stridor. Hematological/Lymphatic/Immunilogical: No cervical lymphadenopathy. Cardiovascular: Normal rate, regular rhythm. Normal and symmetric distal pulses are present in all extremities. No murmurs, rubs, or gallops. Respiratory: Normal respiratory effort without tachypnea nor retractions. Breath sounds are clear and equal bilaterally. No  wheezes/rales/rhonchi. Gastrointestinal: Positive for pelvic pain to palpation laterally. No distention. There is no CVA tenderness. Genitourinary: deferred Musculoskeletal: Nontender with normal range of motion in all extremities. No joint effusions.  No lower extremity tenderness nor edema. Neurologic:  Normal speech and language. No gross focal neurologic deficits are appreciated. Speech is normal.  Skin:  Skin is warm, dry and intact. No rash noted. Psychiatric: Mood and affect are normal. Speech and behavior are normal. Patient exhibits appropriate insight and judgment.  ____________________________________________    LABS (pertinent positives/negatives)  Labs Reviewed  HCG, QUANTITATIVE, PREGNANCY - Abnormal; Notable for the following:    hCG, Beta Chain, Quant, S 2606 (*)    All other components within normal limits  CBC - Abnormal; Notable for the following:    WBC 11.3 (*)    RBC 3.79 (*)    Hemoglobin 10.2 (*)    HCT 31.8 (*)    RDW 15.8 (*)    All other components within normal limits     RADIOLOGY  US Ob Transvaginal (Final result) Result time: 07/31/15 04:05:55   Final result by Rad Results In Interface (07/31/15 04:05:55)   Narrative:   CLINICAL DATA: Pregnant patient with vaginal bleeding with pelvic cramping and passage of clots for 3 days.  EXAM: OBSTETRIC <14 WK [place] AND TRANSVAGINAL OB US  TECHNIQUE: Both transabdominal and transvaginal ultrasound examinations were performed for complete evaluation of the gestation as well as the maternal uterus, adnexal regions, and pelvic cul-de-sac. Transvaginal technique was performed to assess early pregnancy.  COMPARISON: Obstetric ultrasound 07/07/2015  FINDINGS: Intrauterine gestational sac: Irregular heterogeneous fluid in the lower uterine segment just above cervical os.  Yolk sac: Not present  Embryo: Not present.  Maternal uterus/adnexae: Irregular heterogeneous fluid in the lower uterine  segment just above cervix. The endometrium is thickened and contains heterogeneous fluid and debris. There is no yolk sac or fetal pole. Neither ovary is visualized. No free fluid in the pelvis.  IMPRESSION: Heterogeneous fluid and debris in the endometrial canal without well-defined gestational sac, yolk sac or fetal pole. Findings are consistent with failed pregnancy.   Electronically Signed By: Jeb Levering M.D. On: 07/31/2015 04:05        INITIAL IMPRESSION / ASSESSMENT AND PLAN / ED COURSE  Pertinent labs & imaging results that were available during my care of the patient were reviewed by me and considered in my medical decision making (see chart for details).  Patient discussed with Dr. Star Age GYN on-call left are reviewed the ultrasound revealed the endometrial stripe was 2.9 evidence of a completed miscarriage as such patient will be discharged home with Percocet for pain at home  with recommendation to return to the emergency department immediately if pain is not controlled worsening bleeding or any emergency medical concerns.  ____________________________________________   FINAL CLINICAL IMPRESSION(S) / ED DIAGNOSES  Final diagnoses:  Miscarriage      Gregor Hams, MD 07/31/15 [phone]

## 2015-07-31 NOTE — ED Notes (Signed)
Pt was told last week by ob that she had abnormal ultrasound and will have miscarriage.  Pt is approx [redacted] weeks pregnant and now having severe abd pain and worsening bleeding.

## 2015-07-31 NOTE — ED Notes (Signed)
Pt returned from US

## 2015-12-27 ENCOUNTER — Emergency Department: Payer: Medicaid Other

## 2015-12-27 ENCOUNTER — Emergency Department
Admission: EM | Admit: 2015-12-27 | Discharge: 2015-12-27 | Disposition: A | Discharge status: Home / Self Care | Payer: Medicaid Other | Attending: Emergency Medicine | Admitting: Emergency Medicine

## 2015-12-27 DIAGNOSIS — R202 Paresthesia of skin: Secondary | ICD-10-CM | POA: Diagnosis not present

## 2015-12-27 DIAGNOSIS — R0789 Other chest pain: Secondary | ICD-10-CM

## 2015-12-27 DIAGNOSIS — I10 Essential (primary) hypertension: Secondary | ICD-10-CM | POA: Diagnosis not present

## 2015-12-27 DIAGNOSIS — R079 Chest pain, unspecified: Secondary | ICD-10-CM | POA: Diagnosis present

## 2015-12-27 LAB — BASIC METABOLIC PANEL
ANION GAP: 4 — AB (ref 5–15)
BUN: 11 mg/dL (ref 6–20)
CALCIUM: 8.7 mg/dL — AB (ref 8.9–10.3)
CHLORIDE: 105 mmol/L (ref 101–111)
CO2: 27 mmol/L (ref 22–32)
CREATININE: 0.64 mg/dL (ref 0.44–1.00)
GFR calc non Af Amer: >60 mL/min (ref >60)
Glucose, Bld: 94 mg/dL (ref 65–99)
Potassium: 3.7 mmol/L (ref 3.5–5.1)
SODIUM: 136 mmol/L (ref 135–145)

## 2015-12-27 LAB — URINALYSIS COMPLETE WITH MICROSCOPIC (ARMC ONLY)
Bilirubin Urine: NEGATIVE
GLUCOSE, UA: NEGATIVE mg/dL
Hgb urine dipstick: NEGATIVE
Ketones, ur: NEGATIVE mg/dL
LEUKOCYTES UA: NEGATIVE
NITRITE: NEGATIVE
PROTEIN: NEGATIVE mg/dL
SPECIFIC GRAVITY, URINE: 1.005 (ref 1.005–1.030)
pH: 6 (ref 5.0–8.0)

## 2015-12-27 LAB — CBC
HCT: 34.1 % — ABNORMAL LOW (ref 35.0–47.0)
HEMOGLOBIN: 10.8 g/dL — AB (ref 12.0–16.0)
MCH: 24.9 pg — ABNORMAL LOW (ref 26.0–34.0)
MCHC: 31.8 g/dL — ABNORMAL LOW (ref 32.0–36.0)
MCV: 78.5 fL — AB (ref 80.0–100.0)
PLATELETS: 354 10*3/uL (ref 150–440)
RBC: 4.34 MIL/uL (ref 3.80–5.20)
RDW: 18.6 % — ABNORMAL HIGH (ref 11.5–14.5)
WBC: 10.2 10*3/uL (ref 3.6–11.0)

## 2015-12-27 LAB — TROPONIN I

## 2015-12-27 LAB — FIBRIN DERIVATIVES D-DIMER (ARMC ONLY): FIBRIN DERIVATIVES D-DIMER (ARMC): 286 (ref 0–499)

## 2015-12-27 MED ORDER — TRAMADOL HCL 50 MG PO TABS: 50.0000 mg | ORAL_TABLET | Freq: Four times a day (QID) | ORAL | Status: DC | PRN

## 2015-12-27 MED ORDER — IBUPROFEN 800 MG PO TABS
ORAL_TABLET | ORAL | Status: AC
  Administered 2015-12-27: 800 mg via ORAL
  Filled 2015-12-27: qty 1

## 2015-12-27 MED ORDER — IBUPROFEN 800 MG PO TABS
800.0000 mg | ORAL_TABLET | Freq: Once | ORAL | Status: AC
  Administered 2015-12-27: 800 mg via ORAL

## 2015-12-27 MED ORDER — IBUPROFEN 800 MG PO TABS: 800.0000 mg | ORAL_TABLET | Freq: Three times a day (TID) | ORAL | Status: DC | PRN

## 2015-12-27 MED ORDER — TRAMADOL HCL 50 MG PO TABS
ORAL_TABLET | ORAL | Status: AC
  Administered 2015-12-27: 50 mg via ORAL
  Filled 2015-12-27: qty 1

## 2015-12-27 MED ORDER — TRAMADOL HCL 50 MG PO TABS
50.0000 mg | ORAL_TABLET | Freq: Once | ORAL | Status: AC
  Administered 2015-12-27: 50 mg via ORAL

## 2015-12-27 NOTE — ED Provider Notes (Signed)
[facility] Emergency Department Provider Note ___________________________________________  Time seen: Approximately 10:09 AM  I have reviewed the triage vital signs and the nursing notes.   HISTORY  Chief Complaint Chest Pain  HPI Lauren [name] is a 40 y.o. female who is complaining of left substernal chest pain this been off and on for 2 days. Patient states that she is also having tingling and pain down her left arm. And then yesterday patient started also having a headache which started in the back of her head was primarily over the frontal area at this time. Patient denies any history of heart or neurological disease. Patient does have family history of heart disease. Patient states that the chest pain has been gone on for approximately 2 days. Patient denies any fever, chills, cough, congestion, nausea, vomiting, change in her bowels, or urinary symptoms. Patient states her pain on scale of 0-10 is about a 4.   Past Medical History  Diagnosis Date  . Hypertension   . Tachycardia     There are no active problems to display for this patient.   Past Surgical History  Procedure Laterality Date  . Rotator cuff repair    . Knee surgery    . Cesarean section    . Cholecystectomy      Current Outpatient Rx  Name  Route  Sig  Dispense  Refill  . ALPRAZolam (XANAX) 1 MG tablet   Oral   Take 1 tablet by mouth 2 (two) times daily.         . celecoxib (CELEBREX) 100 MG capsule   Oral   Take 100 mg by mouth 2 (two) times daily.         Marland Kitchen HYDROcodone-acetaminophen (NORCO) 5-325 MG per tablet   Oral   Take 1 tablet by mouth every 4 (four) hours as needed for moderate pain.   20 tablet   0   . ibuprofen (ADVIL,MOTRIN) 800 MG tablet   Oral   Take 1 tablet (800 mg total) by mouth every 8 (eight) hours as needed.   15 tablet   0   . ibuprofen (ADVIL,MOTRIN) 800 MG tablet   Oral   Take 1 tablet (800 mg total) by mouth every 8 (eight) hours as  needed.   20 tablet   0   . lidocaine (LIDODERM) 5 %   Transdermal   Place 1 patch onto the skin daily.         Marland Kitchen EXPIRED: lisinopril-hydrochlorothiazide (PRINZIDE,ZESTORETIC) 20-25 MG per tablet   Oral   Take 1 tablet by mouth daily.         Marland Kitchen oxyCODONE-acetaminophen (PERCOCET/ROXICET) 5-325 MG per tablet   Oral   Take 1 tablet by mouth every 8 (eight) hours.         Marland Kitchen oxyCODONE-acetaminophen (PERCOCET/ROXICET) 5-325 MG tablet   Oral   Take 1 tablet by mouth once.   20 tablet   0   . traMADol (ULTRAM) 50 MG tablet   Oral   Take 1 tablet (50 mg total) by mouth every 6 (six) hours as needed.   20 tablet   0     Allergies Nucynta  No family history on file.  Social History Social History  Substance Use Topics  . Smoking status: Never Smoker   . Smokeless tobacco: Never Used  . Alcohol Use: No    Review of Systems Constitutional: No fever/chills Eyes: No visual changes. ENT: No sore throat. Cardiovascular: Positive for left chest pain.  Respiratory: Denies shortness of breath. Gastrointestinal: No abdominal pain.  No nausea, no vomiting.  No diarrhea.  No constipation. Genitourinary: Negative for dysuria. Musculoskeletal: Negative for back pain. Positive for pain in the left arm. Skin: Negative for rash. Neurological: Positive for for headaches, left arm tingling, but no focal weakness..  10-point ROS otherwise negative.  ____________________________________________   PHYSICAL EXAM:  VITAL SIGNS: ED Triage Vitals  Enc Vitals Group     BP 12/27/15 0945 150/80 mmHg     Pulse Rate 12/27/15 0945 72     Resp 12/27/15 0945 20     Temp 12/27/15 0945 98.3 F (36.8 C)     Temp Source 12/27/15 0945 Oral     SpO2 12/27/15 0945 96 %     Weight 12/27/15 0945 274 lb (124.286 kg)     Height 12/27/15 0945 5\' 4"  (1.626 m)     Head Cir --      Peak Flow --      Pain Score 12/27/15 0947 7     Pain Loc --      Pain Edu? --      Excl. in [place]? --      Constitutional: Alert and oriented. Well appearing and in no acute distress. Eyes: Conjunctivae are normal. PERRL. EOMI. Head: Atraumatic. Nose: No congestion/rhinnorhea. Mouth/Throat: Mucous membranes are moist.  Oropharynx non-erythematous. Neck: No stridor.   Cardiovascular: Normal rate, regular rhythm. Grossly normal heart sounds.  Good peripheral circulation. Respiratory: Normal respiratory effort.  No retractions. Lungs CTAB.Patient with palpable tenderness along her left anterior lateral chest and superior lateral breast area, but she states that this does not exactly elicit her pain. Gastrointestinal: Soft and nontender. No distention. No abdominal bruits. No CVA tenderness. Musculoskeletal: No lower extremity tenderness nor edema.  No joint effusions. Neurologic:  Normal speech and language. No gross focal neurologic deficits are appreciated. No gait instability. Skin:  Skin is warm, dry and intact. No rash noted. Psychiatric: Mood and affect are normal. Speech and behavior are normal.  ____________________________________________   LABS (all labs ordered are listed, but only abnormal results are displayed)  Labs Reviewed  BASIC METABOLIC PANEL - Abnormal; Notable for the following:    Calcium 8.7 (*)    Anion gap 4 (*)    All other components within normal limits  CBC - Abnormal; Notable for the following:    Hemoglobin 10.8 (*)    HCT 34.1 (*)    MCV 78.5 (*)    MCH 24.9 (*)    MCHC 31.8 (*)    RDW 18.6 (*)    All other components within normal limits  URINALYSIS COMPLETEWITH MICROSCOPIC ([facility] ONLY) - Abnormal; Notable for the following:    Color, Urine STRAW (*)    APPearance CLEAR (*)    Bacteria, UA RARE (*)    Squamous Epithelial / LPF 0-5 (*)    All other components within normal limits  TROPONIN I  FIBRIN DERIVATIVES D-DIMER ([facility] ONLY)  TROPONIN I  POC URINE PREG, ED   ____________________________________________  EKG  ED ECG REPORT I, Ruby Cola, the attending physician, personally viewed and interpreted this ECG.   Date: 12/27/2015  EKG Time: 950  Rate: 67  Rhythm: normal sinus rhythm, PAC's noted  Axis: Normal  Intervals:none  ST&T Change: None  ____________________________________________  RADIOLOGY Dg Chest 2 View  12/27/2015  CLINICAL DATA:  Left chest pain. Left arm pain and numbness for the past 2 days. EXAM: CHEST  2  VIEW COMPARISON:  12/08/2013. FINDINGS: Normal sized heart. Clear lungs. Unremarkable bones. Cholecystectomy clips. IMPRESSION: No acute abnormality. Electronically Signed   By: Claudie Revering M.D.   On: 12/27/2015 10:44   Ct Head Wo Contrast  12/27/2015  CLINICAL DATA:  Left arm numbness and pain with headache for 2 days. EXAM: CT HEAD WITHOUT CONTRAST TECHNIQUE: Contiguous axial images were obtained from the base of the skull through the vertex without intravenous contrast. COMPARISON:  07/31/2011 FINDINGS: Sinuses/Soft tissues: Clear paranasal sinuses and mastoid air cells. Intracranial: No mass lesion, hemorrhage, hydrocephalus, acute infarct, intra-axial, or extra-axial fluid collection. IMPRESSION: Normal head CT. Electronically Signed   By: Abigail Miyamoto M.D.   On: 12/27/2015 11:15    ____________________________________________   PROCEDURES  Procedure(s) performed: None  Critical Care performed: No  ____________________________________________   INITIAL IMPRESSION / ASSESSMENT AND PLAN / ED COURSE  Pertinent labs & imaging results that were available during my care of the patient were reviewed by me and considered in my medical decision making (see chart for details).  3:31 PM Patient will get routine labs, EKG, chest x-ray, and CT head to rule out any acute findings.  3:31 PM Patient's d-dimer was negative. Patient's initial troponin was negative. Patient is going to get a 3 hour repeat troponin at this time and if it still negative we will place her on some pain medicine and have  her follow-up with Dr. Chancy Milroy this week as an outpatient for stress test and echocardiogram.  3:31 PM Patient's 3 hour troponin was negative. Again since patient's pain had been going on for a couple of days and she had palpable chest wall tenderness that I felt she was noncardiac chest pain thought since patient did have a couple risk factors for heart disease, we will send patient to follow up with Dr. Chancy Milroy cardiologist in his office this week for outpatient stress test and echocardiogram. Patient was given tramadol and ibuprofen to take for her headache and her chest pain. Patient was told to return immediately if condition worsens. ____________________________________________   FINAL CLINICAL IMPRESSION(S) / ED DIAGNOSES  Final diagnoses:  Nonspecific chest pain  Chest wall pain  Paresthesia of left upper limb      Ruby Cola, MD 12/27/15 1531

## 2015-12-27 NOTE — ED Notes (Signed)
POC negative  

## 2015-12-27 NOTE — ED Notes (Addendum)
Pt reports chest pain for past 2 days that has gotten worse since last night. Pt states it radiates down left arm nausea and SOB when the chest pain started again this morning.  Pt also c/o back pain and urinary frequency

## 2016-02-05 ENCOUNTER — Emergency Department
Admission: EM | Admit: 2016-02-05 | Discharge: 2016-02-05 | Disposition: A | Discharge status: Home / Self Care | Payer: Medicaid Other | Attending: Emergency Medicine | Admitting: Emergency Medicine

## 2016-02-05 ENCOUNTER — Encounter: Payer: Self-pay | Admitting: Emergency Medicine

## 2016-02-05 ENCOUNTER — Emergency Department: Payer: Medicaid Other

## 2016-02-05 DIAGNOSIS — Y929 Unspecified place or not applicable: Secondary | ICD-10-CM | POA: Diagnosis not present

## 2016-02-05 DIAGNOSIS — M79642 Pain in left hand: Secondary | ICD-10-CM | POA: Diagnosis not present

## 2016-02-05 DIAGNOSIS — F17210 Nicotine dependence, cigarettes, uncomplicated: Secondary | ICD-10-CM | POA: Diagnosis not present

## 2016-02-05 DIAGNOSIS — Z79899 Other long term (current) drug therapy: Secondary | ICD-10-CM | POA: Diagnosis not present

## 2016-02-05 DIAGNOSIS — Y999 Unspecified external cause status: Secondary | ICD-10-CM | POA: Diagnosis not present

## 2016-02-05 DIAGNOSIS — I10 Essential (primary) hypertension: Secondary | ICD-10-CM | POA: Diagnosis not present

## 2016-02-05 DIAGNOSIS — Y939 Activity, unspecified: Secondary | ICD-10-CM | POA: Insufficient documentation

## 2016-02-05 DIAGNOSIS — S0990XA Unspecified injury of head, initial encounter: Secondary | ICD-10-CM | POA: Diagnosis present

## 2016-02-05 DIAGNOSIS — Z791 Long term (current) use of non-steroidal anti-inflammatories (NSAID): Secondary | ICD-10-CM | POA: Diagnosis not present

## 2016-02-05 LAB — URINALYSIS COMPLETE WITH MICROSCOPIC (ARMC ONLY)
BILIRUBIN URINE: NEGATIVE
Bacteria, UA: NONE SEEN
GLUCOSE, UA: NEGATIVE mg/dL
Hgb urine dipstick: NEGATIVE
Ketones, ur: NEGATIVE mg/dL
Nitrite: NEGATIVE
Protein, ur: NEGATIVE mg/dL
Specific Gravity, Urine: 1.019 (ref 1.005–1.030)
pH: 5 (ref 5.0–8.0)

## 2016-02-05 LAB — BASIC METABOLIC PANEL
Anion gap: 7 (ref 5–15)
BUN: 10 mg/dL (ref 6–20)
CO2: 24 mmol/L (ref 22–32)
CREATININE: 0.76 mg/dL (ref 0.44–1.00)
Calcium: 8.9 mg/dL (ref 8.9–10.3)
Chloride: 107 mmol/L (ref 101–111)
GFR calc Af Amer: >60 mL/min (ref >60)
GFR calc non Af Amer: >60 mL/min (ref >60)
Glucose, Bld: 91 mg/dL (ref 65–99)
Potassium: 3.7 mmol/L (ref 3.5–5.1)
SODIUM: 138 mmol/L (ref 135–145)

## 2016-02-05 LAB — CBC
HCT: 36.6 % (ref 35.0–47.0)
Hemoglobin: 11.5 g/dL — ABNORMAL LOW (ref 12.0–16.0)
MCH: 25.3 pg — AB (ref 26.0–34.0)
MCHC: 31.4 g/dL — AB (ref 32.0–36.0)
MCV: 80.6 fL (ref 80.0–100.0)
PLATELETS: 321 10*3/uL (ref 150–440)
RBC: 4.54 MIL/uL (ref 3.80–5.20)
RDW: 17.7 % — AB (ref 11.5–14.5)
WBC: 15.2 10*3/uL — AB (ref 3.6–11.0)

## 2016-02-05 LAB — POCT PREGNANCY, URINE: PREG TEST UR: NEGATIVE

## 2016-02-05 MED ORDER — IBUPROFEN 600 MG PO TABS
600.0000 mg | ORAL_TABLET | Freq: Once | ORAL | Status: AC
  Administered 2016-02-05: 600 mg via ORAL
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
## 2016-02-05 NOTE — ED Notes (Signed)
Patient states she could be pregnant also.

## 2016-02-05 NOTE — Discharge Instructions (Signed)
Please seek medical attention for any high fevers, chest pain, shortness of breath, change in behavior, persistent vomiting, bloody stool or any other new or concerning symptoms. ° ° °Musculoskeletal Pain °Musculoskeletal pain is muscle and boney aches and pains. These pains can occur in any part of the body. Your caregiver may treat you without knowing the cause of the pain. They may treat you if blood or urine tests, X-rays, and other tests were normal.  °CAUSES °There is often not a definite cause or reason for these pains. These pains may be caused by a type of germ (virus). The discomfort may also come from overuse. Overuse includes working out too hard when your body is not fit. Boney aches also come from weather changes. Bone is sensitive to atmospheric pressure changes. °HOME CARE INSTRUCTIONS  °· Ask when your test results will be ready. Make sure you get your test results. °· Only take over-the-counter or prescription medicines for pain, discomfort, or fever as directed by your caregiver. If you were given medications for your condition, do not drive, operate machinery or power tools, or sign legal documents for 24 hours. Do not drink alcohol. Do not take sleeping pills or other medications that may interfere with treatment. °· Continue all activities unless the activities cause more pain. When the pain lessens, slowly resume normal activities. Gradually increase the intensity and duration of the activities or exercise. °· During periods of severe pain, bed rest may be helpful. Lay or sit in any position that is comfortable. °· Putting ice on the injured area. °¨ Put ice in a bag. °¨ Place a towel between your skin and the bag. °¨ Leave the ice on for 15 to 20 minutes, 3 to 4 times a day. °· Follow up with your caregiver for continued problems and no reason can be found for the pain. If the pain becomes worse or does not go away, it may be necessary to repeat tests or do additional testing. Your caregiver  may need to look further for a possible cause. °SEEK IMMEDIATE MEDICAL CARE IF: °· You have pain that is getting worse and is not relieved by medications. °· You develop chest pain that is associated with shortness or breath, sweating, feeling sick to your stomach (nauseous), or throw up (vomit). °· Your pain becomes localized to the abdomen. °· You develop any new symptoms that seem different or that concern you. °MAKE SURE YOU:  °· Understand these instructions. °· Will watch your condition. °· Will get help right away if you are not doing well or get worse. °  °This information is not intended to replace advice given to you by your health care provider. Make sure you discuss any questions you have with your health care provider. °  °Document Released: 08/29/2005 Document Revised: 11/21/2011 Document Reviewed: 05/03/2013 °Elsevier Interactive Patient Education ©2016 [employer] ° °

## 2016-02-05 NOTE — ED Notes (Signed)
CSW at bedside at this time.  

## 2016-03-14 ENCOUNTER — Other Ambulatory Visit: Payer: Self-pay | Admitting: Obstetrics & Gynecology

## 2016-03-14 DIAGNOSIS — O200 Threatened abortion: Secondary | ICD-10-CM

## 2016-03-16 ENCOUNTER — Ambulatory Visit
Admission: RE | Admit: 2016-03-16 | Discharge: 2016-03-16 | Disposition: A | Discharge status: Home / Self Care | Payer: Medicaid Other | Source: Ambulatory Visit | Attending: Obstetrics & Gynecology | Admitting: Obstetrics & Gynecology

## 2016-03-16 DIAGNOSIS — Z3A01 Less than 8 weeks gestation of pregnancy: Secondary | ICD-10-CM | POA: Insufficient documentation

## 2016-03-16 DIAGNOSIS — O200 Threatened abortion: Secondary | ICD-10-CM | POA: Insufficient documentation

## 2016-03-25 ENCOUNTER — Emergency Department
Admission: EM | Admit: 2016-03-25 | Discharge: 2016-03-25 | Disposition: A | Discharge status: Home / Self Care | Payer: Medicaid Other | Attending: Emergency Medicine | Admitting: Emergency Medicine

## 2016-03-25 ENCOUNTER — Emergency Department: Payer: Medicaid Other

## 2016-03-25 ENCOUNTER — Encounter: Payer: Self-pay | Admitting: Radiology

## 2016-03-25 DIAGNOSIS — O200 Threatened abortion: Secondary | ICD-10-CM | POA: Insufficient documentation

## 2016-03-25 DIAGNOSIS — Z791 Long term (current) use of non-steroidal anti-inflammatories (NSAID): Secondary | ICD-10-CM | POA: Insufficient documentation

## 2016-03-25 DIAGNOSIS — I10 Essential (primary) hypertension: Secondary | ICD-10-CM | POA: Diagnosis not present

## 2016-03-25 DIAGNOSIS — O209 Hemorrhage in early pregnancy, unspecified: Secondary | ICD-10-CM | POA: Diagnosis present

## 2016-03-25 DIAGNOSIS — O99331 Smoking (tobacco) complicating pregnancy, first trimester: Secondary | ICD-10-CM | POA: Diagnosis not present

## 2016-03-25 DIAGNOSIS — F17210 Nicotine dependence, cigarettes, uncomplicated: Secondary | ICD-10-CM | POA: Diagnosis not present

## 2016-03-25 DIAGNOSIS — Z79899 Other long term (current) drug therapy: Secondary | ICD-10-CM | POA: Insufficient documentation

## 2016-03-25 DIAGNOSIS — Z3A01 Less than 8 weeks gestation of pregnancy: Secondary | ICD-10-CM | POA: Diagnosis not present

## 2016-03-25 LAB — BASIC METABOLIC PANEL
Anion gap: 8 (ref 5–15)
BUN: 11 mg/dL (ref 6–20)
CALCIUM: 8.6 mg/dL — AB (ref 8.9–10.3)
CO2: 23 mmol/L (ref 22–32)
Chloride: 105 mmol/L (ref 101–111)
Creatinine, Ser: 0.68 mg/dL (ref 0.44–1.00)
GFR calc Af Amer: >60 mL/min (ref >60)
GFR calc non Af Amer: >60 mL/min (ref >60)
GLUCOSE: 96 mg/dL (ref 65–99)
Potassium: 4.1 mmol/L (ref 3.5–5.1)
Sodium: 136 mmol/L (ref 135–145)

## 2016-03-25 LAB — CBC WITH DIFFERENTIAL/PLATELET
BASOS ABS: 0.1 10*3/uL (ref 0–0.1)
Basophils Relative: 0 %
EOS PCT: 1 %
Eosinophils Absolute: 0.1 10*3/uL (ref 0–0.7)
HEMATOCRIT: 33.8 % — AB (ref 35.0–47.0)
Hemoglobin: 11 g/dL — ABNORMAL LOW (ref 12.0–16.0)
LYMPHS ABS: 3.6 10*3/uL (ref 1.0–3.6)
LYMPHS PCT: 29 %
MCH: 26.7 pg (ref 26.0–34.0)
MCHC: 32.7 g/dL (ref 32.0–36.0)
MCV: 81.8 fL (ref 80.0–100.0)
MONO ABS: 0.7 10*3/uL (ref 0.2–0.9)
MONOS PCT: 6 %
NEUTROS ABS: 8.1 10*3/uL — AB (ref 1.4–6.5)
Neutrophils Relative %: 64 %
PLATELETS: 405 10*3/uL (ref 150–440)
RBC: 4.13 MIL/uL (ref 3.80–5.20)
RDW: 17 % — AB (ref 11.5–14.5)
WBC: 12.6 10*3/uL — ABNORMAL HIGH (ref 3.6–11.0)

## 2016-03-25 LAB — ABO/RH: ABO/RH(D): A POS

## 2016-03-25 LAB — HCG, QUANTITATIVE, PREGNANCY: hCG, Beta Chain, Quant, S: 37336 m[IU]/mL — ABNORMAL HIGH (ref <5)

## 2016-03-25 MED ORDER — ACETAMINOPHEN 325 MG PO TABS
650.0000 mg | ORAL_TABLET | Freq: Once | ORAL | Status: AC
  Administered 2016-03-25: 650 mg via ORAL

## 2016-03-25 MED ORDER — ACETAMINOPHEN 325 MG PO TABS
ORAL_TABLET | ORAL | Status: AC
  Filled 2016-03-25: qty 2

## 2016-03-25 NOTE — ED Provider Notes (Addendum)
[facility] Emergency Department Provider Note  ____________________________________________   I have reviewed the triage vital signs and the nursing notes.   HISTORY  Chief Complaint Vaginal Bleeding    HPI Lauren [name] is a 40 y.o. female who is approximately [redacted] weeks pregnant, G10  P7 a 1 ectopic x 1 treated with medication,  presents today complaining of having had some vaginal bleeding earlier today. Patient had an ultrasound on 03/16/2016 which showed "Gestational sac and yolk sac present without fetal pole or cardiac activity. Sac diameter of 8 mm corresponds with a 5 week 4 day gestational. "  Since that time, she has had an unremarkable pregnancy although she did then have a gush of blood earlier today which she describes as a mild amount of by some spotting. She is no longer bleeding. Did not pass any products of conception that she is aware of. Not passing clots. Patient states she does not wish to have a pelvic exam and she does understand the limitations of places upon the workup here. Patient states that she is not having ongoing pain at this time although she had some mild cramping earlier. Apparently, her OB/GYN suggested she come in for repeat ultrasound.   Past Medical History  Diagnosis Date  . Hypertension   . Tachycardia     There are no active problems to display for this patient.   Past Surgical History  Procedure Laterality Date  . Rotator cuff repair    . Knee surgery    . Cesarean section    . Cholecystectomy      Current Outpatient Rx  Name  Route  Sig  Dispense  Refill  . acetaminophen (TYLENOL) 500 MG tablet   Oral   Take 500 mg by mouth every 6 (six) hours as needed.         . ALPRAZolam (XANAX) 1 MG tablet   Oral   Take 1 tablet by mouth 2 (two) times daily.         Marland Kitchen esomeprazole (NEXIUM) 40 MG capsule   Oral   Take 40 mg by mouth daily at 12 noon.         . furosemide (LASIX) 40 MG tablet   Oral   Take  40 mg by mouth daily.      4   . meloxicam (MOBIC) 7.5 MG tablet   Oral   Take 7.5 mg by mouth daily.         . potassium chloride SA (K-DUR,KLOR-CON) 20 MEQ tablet   Oral   Take 20 mEq by mouth daily.           Allergies Nucynta  No family history on file.  Social History Social History  Substance Use Topics  . Smoking status: Current Some Day Smoker    Types: Cigarettes  . Smokeless tobacco: Never Used  . Alcohol Use: Yes    Review of Systems Constitutional: No fever/chills Eyes: No visual changes. ENT: No sore throat. No stiff neck no neck pain Cardiovascular: Denies chest pain. Respiratory: Denies shortness of breath. Gastrointestinal:   no vomiting.  No diarrhea.  No constipation. Genitourinary: Negative for dysuria. Musculoskeletal: Negative lower extremity swelling Skin: Negative for rash. Neurological: Negative for headaches, focal weakness or numbness. 10-point ROS otherwise negative.  ____________________________________________   PHYSICAL EXAM:  VITAL SIGNS: ED Triage Vitals  Enc Vitals Group     BP 03/25/16 1948 150/116 mmHg     Pulse Rate 03/25/16 1948 92  Resp 03/25/16 1948 18     Temp 03/25/16 1948 98.3 F (36.8 C)     Temp Source 03/25/16 1948 Oral     SpO2 03/25/16 1948 100 %     Weight 03/25/16 1948 275 lb (124.739 kg)     Height 03/25/16 1948 5\' 4"  (1.626 m)     Head Cir --      Peak Flow --      Pain Score 03/25/16 1949 8     Pain Loc --      Pain Edu? --      Excl. in [place]? --     Constitutional: Alert and oriented. Well appearing and in no acute distress. Eyes: Conjunctivae are normal. PERRL. EOMI. Head: Atraumatic. Nose: No congestion/rhinnorhea. Mouth/Throat: Mucous membranes are moist.  Oropharynx non-erythematous. Neck: No stridor.   Nontender with no meningismus Cardiovascular: Normal rate, regular rhythm. Grossly normal heart sounds.  Good peripheral circulation. Respiratory: Normal respiratory effort.  No  retractions. Lungs CTAB. Abdominal: Soft and nontender. No distention. No guarding no rebound Back:  There is no focal tenderness or step off there is no midline tenderness there are no lesions noted. there is no CVA tenderness GU: Patient declines Musculoskeletal: No lower extremity tenderness. No joint effusions, no DVT signs strong distal pulses no edema Neurologic:  Normal speech and language. No gross focal neurologic deficits are appreciated.  Skin:  Skin is warm, dry and intact. No rash noted. Psychiatric: Mood and affect are normal. Speech and behavior are normal.  ____________________________________________   LABS (all labs ordered are listed, but only abnormal results are displayed)  Labs Reviewed  HCG, QUANTITATIVE, PREGNANCY - Abnormal; Notable for the following:    hCG, Beta Chain, Quant, S 37336 (*)    All other components within normal limits  CBC WITH DIFFERENTIAL/PLATELET - Abnormal; Notable for the following:    WBC 12.6 (*)    Hemoglobin 11.0 (*)    HCT 33.8 (*)    RDW 17.0 (*)    Neutro Abs 8.1 (*)    All other components within normal limits  BASIC METABOLIC PANEL - Abnormal; Notable for the following:    Calcium 8.6 (*)    All other components within normal limits  ABO/RH   ____________________________________________  EKG  I personally interpreted any EKGs ordered by me or triage  ____________________________________________  RADIOLOGY  I reviewed any imaging ordered by me or triage that were performed during my shift and, if possible, patient and/or family made aware of any abnormal findings. ____________________________________________   PROCEDURES  Procedure(s) performed: None  Critical Care performed: None  ____________________________________________   INITIAL IMPRESSION / ASSESSMENT AND PLAN / ED COURSE  Pertinent labs & imaging results that were available during my care of the patient were reviewed by me and considered in my  medical decision making (see chart for details).  Patient with vaginal bleeding and pregnancy. According to her, after her last ultrasound her [place] Brown was going up but those results are not available in our system. Her Quant today is [phone]. She is a positive, hemoglobin 11, and her blood work is otherwise reassuring. We will obtain ultrasound and assess pregnancy. Patient, again, declines pelvic exam..  ----------------------------------------- 10:42 PM on 03/25/2016 -----------------------------------------  Patient comfortable without any claims or complaints of discomfort, awaiting results of ultrasound.  ----------------------------------------- 11:06 PM on 03/25/2016 -----------------------------------------  Patient given extensive return precautions including bleeding precautions and pain precautions, she is relaxed and in no discomfort, you're to go home. Discussed  with Dr. Glennon Mac just to let them know the results he agrees with management and they will follow-up as an outpatient. ____________________________________________   FINAL CLINICAL IMPRESSION(S) / ED DIAGNOSES  Final diagnoses:  None      This chart was dictated using voice recognition software.  Despite best efforts to proofread,  errors can occur which can change meaning.     Schuyler Amor, MD 03/25/16 2153  Schuyler Amor, MD 03/25/16 [id number]  Schuyler Amor, MD 03/25/16 [phone]

## 2016-03-25 NOTE — Discharge Instructions (Signed)

## 2016-03-25 NOTE — ED Notes (Signed)
Reviewed d/c instructions, follow-up care with pt. Pt verbalized understanding 

## 2016-03-25 NOTE — ED Notes (Signed)
Pt reports she is approximately [redacted] weeks pregnant and she started having some bleeding today. Also having lower abd and low back pain.  Spoke with Dr Kenton Kingfisher and was advised to come the ER. Pt is G 9, P7, A1.

## 2016-03-25 NOTE — ED Notes (Signed)
Pt is [redacted] weeks pregnant. Pt c/o of pelvic/lower back/lower abdominal pain beginning Monday. Was seen by MD Kenton Kingfisher at [facility] for pain - was told to go to ED for increasing pain/bleeding. Pt has hx of miscarriage in November and ectopic pregnancy. Pt reports she had gush of blood at 11 am today, and intermittent spotting since.

## 2016-05-12 ENCOUNTER — Ambulatory Visit: Admission: RE | Admit: 2016-05-12 | Payer: Medicaid Other | Source: Ambulatory Visit

## 2016-05-30 ENCOUNTER — Encounter: Payer: Self-pay | Admitting: *Deleted

## 2016-05-30 ENCOUNTER — Ambulatory Visit
Admission: RE | Admit: 2016-05-30 | Discharge: 2016-05-30 | Disposition: A | Discharge status: Home / Self Care | Payer: Medicaid Other | Source: Ambulatory Visit | Attending: Maternal and Fetal Medicine | Admitting: Maternal and Fetal Medicine

## 2016-05-30 DIAGNOSIS — F329 Major depressive disorder, single episode, unspecified: Secondary | ICD-10-CM

## 2016-05-30 DIAGNOSIS — O09522 Supervision of elderly multigravida, second trimester: Secondary | ICD-10-CM

## 2016-05-30 DIAGNOSIS — O99342 Other mental disorders complicating pregnancy, second trimester: Secondary | ICD-10-CM

## 2016-05-30 DIAGNOSIS — O169 Unspecified maternal hypertension, unspecified trimester: Secondary | ICD-10-CM | POA: Insufficient documentation

## 2016-05-30 DIAGNOSIS — O162 Unspecified maternal hypertension, second trimester: Secondary | ICD-10-CM

## 2016-05-30 DIAGNOSIS — O09529 Supervision of elderly multigravida, unspecified trimester: Secondary | ICD-10-CM | POA: Insufficient documentation

## 2016-05-30 DIAGNOSIS — E669 Obesity, unspecified: Secondary | ICD-10-CM

## 2016-05-30 DIAGNOSIS — F32A Depression, unspecified: Secondary | ICD-10-CM

## 2016-05-30 HISTORY — DX: Major depressive disorder, single episode, unspecified: F32.9

## 2016-05-30 HISTORY — DX: Headache, unspecified: R51.9

## 2016-05-30 HISTORY — DX: Headache: R51

## 2016-05-30 HISTORY — DX: Depression, unspecified: F32.A

## 2016-05-30 NOTE — Progress Notes (Signed)
[place] [facility] Consultation   Chief Complaint: Consult for chronic hypertension, AMA, anxiety and depression  HPI: [name] is a 40 y.o. G10 [phone] at [redacted]w[redacted]d by [redacted]w[redacted]d [place] who presents in consultation from  for [facility]   Past Medical History: Patient  has a past medical history of Depression; Headache; Hypertension; and Tachycardia.  Past Surgical History: She  has a past surgical history that includes Rotator cuff repair; Knee surgery; Cesarean section; and Cholecystectomy.  Obstetric History:  One ectopic Most recent pregnancy SAB 7 living children, last 2 cesarean for NRFHT in setting of pre-eclampsia at 36-37 weeks per patient  Gynecologic History:  Patient's last menstrual period was 01/13/2016 (within days).   Medications: She takes PNV, tylenol, and nexium Allergies: Patient is allergic to nucynta [tapentadol].  Social History: Patient  reports that she has quit smoking. Her smoking use included Cigarettes. She has never used smokeless tobacco. She reports that she uses drugs, including Marijuana. She reports that she does not drink alcohol.  Family History: family history is not on file.  Review of Systems A full 12 point review of systems was negative or as noted in the History of Present Illness.  Physical Exam: BP 136/74   Pulse 93   Temp 98.6 F (37 C)   Ht 5\' 5"  (1.651 m)   Wt 293 lb (132.9 kg)   LMP 01/13/2016 (Within Days) Comment: pt is pregnant  BMI 48.76 kg/m  Deferred, advice only consult  Assessment and Plan:  1. Chronic hypertension  -Recommend checking baseline labs including: P/C ratio, AST/ALT, platelets, BUN/Cr  -Consider beginning anti-hypertensive if BPs persistently high 150/100s or severe range. Would recommend labetolol 200mg  BID to start to avoid tachcardia that could be worsened by nifedipine given the diagnosis of tachycardia per patient.  -Recommend detailed anatomy ultrasound for this indication and AMA at 18-20  weeks  -Recommend beginning bASA for pre-e prevention now. I advised patient to buy at local pharmacy OTC 81mg .  -Recommend monthly growth ultrasounds in the third trimester  -Recommend NST weekly at 34 weeks (or twice weekly if on meds)  2. "Enlarged heart"  -Recommend maternal cardiac echocardiogram now and repeat at 28-32 weeks if abnormal.  3. "Borderline" diabetes  -Patient reports she had an early glucola, but I don't see these results. Consider a hemoglolin A1C and re-check glucola at 24-28 weeks if within normal limits  4. Anxiety/depression  -Patient reports she was taking xanax prior to pregnancy. I advised that we would not recommend this medication in pregnancy but that a low dose of SSRI would be safe and recommended if she is having difficulty with her mood. 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  5. ?enlarged lymph nodes vs thyroid?  -The patient was unsure and I was unable to locate records regarding this. Consider checking a TSH/free T4 and therapy if abnormal.  6. AMA  - The patient will have a detailed ultrasound in 2-4 weeks. Consider genetic counseling regarding aneuploidy risk at that time. This was not ordered today.  Thank you for referring your patient for consultation.   Total time spent with the patient was 40 minutes with greater than 50% spent in counseling and coordination of care. We appreciate this interesting consult and will be happy to be involved in the ongoing care of [name] in anyway her obstetricians desire.  Ricardo Jericho, MD [facility]

## 2016-06-20 ENCOUNTER — Ambulatory Visit
Admission: RE | Admit: 2016-06-20 | Discharge: 2016-06-20 | Disposition: A | Discharge status: Home / Self Care | Payer: Medicaid Other | Source: Ambulatory Visit | Attending: Maternal & Fetal Medicine | Admitting: Maternal & Fetal Medicine

## 2016-06-20 DIAGNOSIS — O09522 Supervision of elderly multigravida, second trimester: Secondary | ICD-10-CM | POA: Diagnosis not present

## 2016-06-20 DIAGNOSIS — O162 Unspecified maternal hypertension, second trimester: Secondary | ICD-10-CM

## 2016-06-20 DIAGNOSIS — O99342 Other mental disorders complicating pregnancy, second trimester: Secondary | ICD-10-CM

## 2016-06-20 DIAGNOSIS — Z3A19 19 weeks gestation of pregnancy: Secondary | ICD-10-CM | POA: Insufficient documentation

## 2016-06-20 DIAGNOSIS — Z3492 Encounter for supervision of normal pregnancy, unspecified, second trimester: Secondary | ICD-10-CM | POA: Insufficient documentation

## 2016-06-20 DIAGNOSIS — Z3490 Encounter for supervision of normal pregnancy, unspecified, unspecified trimester: Secondary | ICD-10-CM

## 2016-06-20 DIAGNOSIS — F32A Depression, unspecified: Secondary | ICD-10-CM

## 2016-06-20 DIAGNOSIS — F329 Major depressive disorder, single episode, unspecified: Secondary | ICD-10-CM

## 2016-08-18 ENCOUNTER — Other Ambulatory Visit: Payer: Self-pay | Admitting: *Deleted

## 2016-08-18 DIAGNOSIS — O09529 Supervision of elderly multigravida, unspecified trimester: Secondary | ICD-10-CM

## 2016-08-18 DIAGNOSIS — E669 Obesity, unspecified: Secondary | ICD-10-CM

## 2016-08-22 ENCOUNTER — Ambulatory Visit
Admission: RE | Admit: 2016-08-22 | Discharge: 2016-08-22 | Disposition: A | Discharge status: Home / Self Care | Payer: Medicaid Other | Source: Ambulatory Visit | Attending: Obstetrics & Gynecology | Admitting: Obstetrics & Gynecology

## 2016-08-22 DIAGNOSIS — Z3A28 28 weeks gestation of pregnancy: Secondary | ICD-10-CM | POA: Diagnosis not present

## 2016-08-22 DIAGNOSIS — O162 Unspecified maternal hypertension, second trimester: Secondary | ICD-10-CM

## 2016-08-22 DIAGNOSIS — E669 Obesity, unspecified: Secondary | ICD-10-CM

## 2016-08-22 DIAGNOSIS — O09529 Supervision of elderly multigravida, unspecified trimester: Secondary | ICD-10-CM

## 2016-08-22 DIAGNOSIS — O99213 Obesity complicating pregnancy, third trimester: Secondary | ICD-10-CM | POA: Diagnosis not present

## 2016-08-22 DIAGNOSIS — O09523 Supervision of elderly multigravida, third trimester: Secondary | ICD-10-CM | POA: Diagnosis present

## 2016-08-22 DIAGNOSIS — F32A Depression, unspecified: Secondary | ICD-10-CM

## 2016-08-22 DIAGNOSIS — F329 Major depressive disorder, single episode, unspecified: Secondary | ICD-10-CM

## 2016-08-22 DIAGNOSIS — O99342 Other mental disorders complicating pregnancy, second trimester: Secondary | ICD-10-CM

## 2016-08-23 ENCOUNTER — Encounter: Payer: Self-pay | Admitting: Emergency Medicine

## 2016-08-23 ENCOUNTER — Inpatient Hospital Stay
Admission: EM | Admit: 2016-08-23 | Discharge: 2016-08-23 | Disposition: A | Discharge status: Home / Self Care | Payer: Medicaid Other | Attending: Obstetrics and Gynecology | Admitting: Obstetrics and Gynecology

## 2016-08-23 DIAGNOSIS — O162 Unspecified maternal hypertension, second trimester: Secondary | ICD-10-CM

## 2016-08-23 DIAGNOSIS — Z6841 Body Mass Index (BMI) 40.0 and over, adult: Secondary | ICD-10-CM | POA: Insufficient documentation

## 2016-08-23 DIAGNOSIS — E669 Obesity, unspecified: Secondary | ICD-10-CM | POA: Diagnosis not present

## 2016-08-23 DIAGNOSIS — O09523 Supervision of elderly multigravida, third trimester: Secondary | ICD-10-CM | POA: Diagnosis not present

## 2016-08-23 DIAGNOSIS — F32A Depression, unspecified: Secondary | ICD-10-CM

## 2016-08-23 DIAGNOSIS — F329 Major depressive disorder, single episode, unspecified: Secondary | ICD-10-CM | POA: Insufficient documentation

## 2016-08-23 DIAGNOSIS — O99213 Obesity complicating pregnancy, third trimester: Secondary | ICD-10-CM | POA: Insufficient documentation

## 2016-08-23 DIAGNOSIS — Z79899 Other long term (current) drug therapy: Secondary | ICD-10-CM | POA: Diagnosis not present

## 2016-08-23 DIAGNOSIS — Z87891 Personal history of nicotine dependence: Secondary | ICD-10-CM | POA: Insufficient documentation

## 2016-08-23 DIAGNOSIS — Z3A28 28 weeks gestation of pregnancy: Secondary | ICD-10-CM | POA: Diagnosis not present

## 2016-08-23 DIAGNOSIS — R079 Chest pain, unspecified: Secondary | ICD-10-CM | POA: Diagnosis present

## 2016-08-23 DIAGNOSIS — Z7982 Long term (current) use of aspirin: Secondary | ICD-10-CM | POA: Insufficient documentation

## 2016-08-23 DIAGNOSIS — O99343 Other mental disorders complicating pregnancy, third trimester: Secondary | ICD-10-CM | POA: Insufficient documentation

## 2016-08-23 DIAGNOSIS — O26893 Other specified pregnancy related conditions, third trimester: Secondary | ICD-10-CM | POA: Insufficient documentation

## 2016-08-23 DIAGNOSIS — O10013 Pre-existing essential hypertension complicating pregnancy, third trimester: Secondary | ICD-10-CM | POA: Diagnosis not present

## 2016-08-23 DIAGNOSIS — O99342 Other mental disorders complicating pregnancy, second trimester: Secondary | ICD-10-CM

## 2016-08-23 LAB — CBC
HEMATOCRIT: 34.6 % — AB (ref 35.0–47.0)
Hemoglobin: 11 g/dL — ABNORMAL LOW (ref 12.0–16.0)
MCH: 26.7 pg (ref 26.0–34.0)
MCHC: 31.9 g/dL — AB (ref 32.0–36.0)
MCV: 83.8 fL (ref 80.0–100.0)
PLATELETS: 326 10*3/uL (ref 150–440)
RBC: 4.13 MIL/uL (ref 3.80–5.20)
RDW: 15.6 % — AB (ref 11.5–14.5)
WBC: 10.6 10*3/uL (ref 3.6–11.0)

## 2016-08-23 LAB — TROPONIN I
Troponin I: <0.03 ng/mL (ref <0.03)
Troponin I: <0.03 ng/mL (ref <0.03)
Troponin I: <0.03 ng/mL (ref <0.03)

## 2016-08-23 LAB — BASIC METABOLIC PANEL
Anion gap: 6 (ref 5–15)
BUN: 13 mg/dL (ref 6–20)
CHLORIDE: 107 mmol/L (ref 101–111)
CO2: 23 mmol/L (ref 22–32)
CREATININE: 0.67 mg/dL (ref 0.44–1.00)
Calcium: 8.5 mg/dL — ABNORMAL LOW (ref 8.9–10.3)
GFR calc Af Amer: >60 mL/min (ref >60)
GFR calc non Af Amer: >60 mL/min (ref >60)
GLUCOSE: 92 mg/dL (ref 65–99)
POTASSIUM: 3.9 mmol/L (ref 3.5–5.1)
Sodium: 136 mmol/L (ref 135–145)

## 2016-08-23 MED ORDER — ACETAMINOPHEN 325 MG PO TABS
ORAL_TABLET | ORAL | Status: AC
  Filled 2016-08-23: qty 2

## 2016-08-23 MED ORDER — ACETAMINOPHEN 325 MG PO TABS
650.0000 mg | ORAL_TABLET | Freq: Once | ORAL | Status: AC
  Administered 2016-08-23: 650 mg via ORAL

## 2016-08-23 NOTE — Discharge Instructions (Signed)
Please seek medical attention for any high fevers, chest pain, shortness of breath, change in behavior, persistent vomiting, bloody stool or any other new or concerning symptoms.  

## 2016-08-23 NOTE — ED Provider Notes (Signed)
[facility] Emergency Department Provider Note    ____________________________________________   I have reviewed the triage vital signs and the nursing notes.   HISTORY  Chief Complaint Chest Pain   History limited by: Not Limited   HPI Lauren [name] is a 40 y.o. female who presents to the emergency department today because of concern for an episode of chest pain. It was located on the left side. It started roughly 20 minutes prior to her arrival and ended shortly after triage. She states she did have pain that radiated down her left arm. She had a feeling that her heart was racing and she had some sweating and shortness of breath. By the time of my examination she states she feels much better. She had similar symptoms once before and had an emergency c-section, although she cannot remember why or what they told her was causing her symptoms. She denies any cough. Pain is not worse with deep breaths.    Past Medical History:  Diagnosis Date  . Depression   . Headache   . Hypertension   . Tachycardia     Patient Active Problem List   Diagnosis Date Noted  . Hypertension complicating pregnancy 99991111  . Obesity 05/30/2016  . Advanced maternal age in multigravida 05/30/2016  . Depression affecting pregnancy in second trimester, antepartum 05/30/2016    Past Surgical History:  Procedure Laterality Date  . CESAREAN SECTION    . CHOLECYSTECTOMY    . KNEE SURGERY    . ROTATOR CUFF REPAIR      Prior to Admission medications   Medication Sig Start Date End Date Taking? Authorizing Provider  acetaminophen (TYLENOL) 500 MG tablet Take 500 mg by mouth every 6 (six) hours as needed.    Historical Provider, MD  ALPRAZolam Duanne Moron) 1 MG tablet Take 1 tablet by mouth 2 (two) times daily. 11/13/14   Historical Provider, MD  aspirin EC 81 MG tablet Take 81 mg by mouth daily.    Historical Provider, MD  esomeprazole (NEXIUM) 40 MG capsule Take 40 mg by mouth  daily at 12 noon.    Historical Provider, MD  furosemide (LASIX) 40 MG tablet Take 40 mg by mouth daily. 11/07/15   Historical Provider, MD  meloxicam (MOBIC) 7.5 MG tablet Take 7.5 mg by mouth daily.    Historical Provider, MD  potassium chloride SA (K-DUR,KLOR-CON) 20 MEQ tablet Take 20 mEq by mouth daily.    Historical Provider, MD  Prenatal Vit-Fe Fumarate-FA (MULTIVITAMIN-PRENATAL) 27-0.8 MG TABS tablet Take 1 tablet by mouth daily at 12 noon.    Historical Provider, MD    Allergies Nucynta [tapentadol]  History reviewed. No pertinent family history.  Social History Social History  Substance Use Topics  . Smoking status: Former Smoker    Types: Cigarettes  . Smokeless tobacco: Never Used     Comment: occasional smoker  . Alcohol use No    Review of Systems  Constitutional: Negative for fever. Cardiovascular: Negative for chest pain. Respiratory: Negative for shortness of breath. Gastrointestinal: Negative for abdominal pain, vomiting and diarrhea. Genitourinary: Negative for dysuria. Musculoskeletal: Negative for back pain. Skin: Negative for rash. Neurological: Negative for headaches, focal weakness or numbness.  10-point ROS otherwise negative.  ____________________________________________   PHYSICAL EXAM:  VITAL SIGNS: ED Triage Vitals [08/23/16 1233]  Enc Vitals Group     BP      Pulse      Resp      Temp  Temp src      SpO2      Weight 300 lb (136.1 kg)     Height 5\' 5"  (1.651 m)     Head Circumference      Peak Flow      Pain Score 7     Pain Loc      Pain Edu?      Excl. in [place]?      Constitutional: Alert and oriented. Well appearing and in no distress. Eyes: Conjunctivae are normal. Normal extraocular movements. ENT   Head: Normocephalic and atraumatic.   Nose: No congestion/rhinnorhea.   Mouth/Throat: Mucous membranes are moist.   Neck: No stridor. Hematological/Lymphatic/Immunilogical: No cervical  lymphadenopathy. Cardiovascular: Normal rate, regular rhythm.  No murmurs, rubs, or gallops. Respiratory: Normal respiratory effort without tachypnea nor retractions. Breath sounds are clear and equal bilaterally. No wheezes/rales/rhonchi. Gastrointestinal: Soft and non tender. Gravid. Genitourinary: Deferred Musculoskeletal: Normal range of motion in all extremities. No lower extremity edema. Neurologic:  Normal speech and language. No gross focal neurologic deficits are appreciated.  Skin:  Skin is warm, dry and intact. No rash noted. Psychiatric: Mood and affect are normal. Speech and behavior are normal. Patient exhibits appropriate insight and judgment.  ____________________________________________    LABS (pertinent positives/negatives)  Labs Reviewed  BASIC METABOLIC PANEL - Abnormal; Notable for the following:       Result Value   Calcium 8.5 (*)    All other components within normal limits  CBC - Abnormal; Notable for the following:    Hemoglobin 11.0 (*)    HCT 34.6 (*)    MCHC 31.9 (*)    RDW 15.6 (*)    All other components within normal limits  TROPONIN I  TROPONIN I  TROPONIN I     ____________________________________________   EKG  I, Nance Pear, attending physician, personally viewed and interpreted this EKG  EKG Time: 1227 Rate: 109 Rhythm: sinus tachycardia Axis: normal Intervals: qtc 436 QRS: narrow ST changes: no st elevation Impression: abnormal ekg   ____________________________________________    RADIOLOGY  None  ____________________________________________   PROCEDURES  Procedures  ____________________________________________   INITIAL IMPRESSION / ASSESSMENT AND PLAN / ED COURSE  Pertinent labs & imaging results that were available during my care of the patient were reviewed by me and considered in my medical decision making (see chart for details).  Patient here for brief episode of chest pain. Troponin x 2 negative  here in the emergency department. Patient without further pain. Did see cardiology last month for chest pain. At this point no evidence of acute injury to the heart. Do not think PE likely given nature of pain, non pleuritic, no cough, no hypoxia or difficulty with breathing. No unilateral swelling. Will discharge from ED and have patient be sent to L and D for evaluation.  ____________________________________________   FINAL CLINICAL IMPRESSION(S) / ED DIAGNOSES  Final diagnoses:  Chest pain, unspecified type     Note: This dictation was prepared with Dragon dictation. Any transcriptional errors that result from this process are unintentional     Nance Pear, MD 08/23/16 Vernelle Emerald

## 2016-08-23 NOTE — OB Triage Note (Signed)
[name] here following ED visit for chest pain. No OB complaints at this time, reports hx of previous c/s X2, elevated BP during all of her previous pregnancies. States has had high BP "off and on" during this pregnancy. Not on any BP medication at this time.

## 2016-08-23 NOTE — ED Triage Notes (Signed)
Pt to ed with c/o chest pain that started about 30 minutes pta.  Pt states she is [redacted] weeks pregnant G10, P7, AB2.  Pt states same thing happened with another baby and she had to have a emergency Csection.  Pt reports pain in chest that radiates down left arm,  Sob, dizziness, weakness.  EKG done and shown to Dr Clearnce Hasten.

## 2016-08-23 NOTE — ED Notes (Signed)
Spoke with dr Clearnce Hasten regarding pt,  He states do cardiac protocol and to call dr Kenton Kingfisher.  Called dr Kenton Kingfisher and he states once pt is cleared from cardiothoracic issues to send to L and D for monitoring.

## 2016-08-23 NOTE — Discharge Summary (Signed)
Patient discharged with instructions on follow up appointments, labor precautions, and when to seek medical attention. Patient ambulatory at discharge with steady gait and no complaints. Patient discharged with family in stable condition.

## 2016-08-23 NOTE — Final Progress Note (Signed)
Patient is at [redacted]w[redacted]d who presented to the ER with chest pain and left arm pain. She has been ruled out in the ER for cardiopulmonary issues.  At the time of her visit with me on L&D she has no complaints apart from a mild headache.  She notes +FM, no LOF, no VB, and no ctx.    BP 140/70 (BP Location: Left Arm)   Pulse 95   Temp 98.3 F (36.8 C) (Oral)   Resp 18   Ht 5\' 5"  (1.651 m)   Wt 300 lb (136.1 kg)   LMP 01/13/2016 (Within Days) Comment: pt is pregnant  SpO2 98%   BMI 49.92 kg/m   Gen: NAD Abd: gravid, NT Ext; no e/c/t  NST: Baseline: 135 Variability: moderate Accelerations: absent Decelerations: absent Tocometry: no activity  A/P: 40 y.o. female at [redacted]w[redacted]d with resolved chest pain. No evidence of fetal distress.  BP mildly elevated. Patient is known to have essential hypertension and no antihypertensive medication is indicated at this time.  Discharge patient to home. She has an appointment tomorrow. She is encouraged to keep this appointment.  Prentice Docker, MD 08/23/2016 8:57 PM

## 2016-09-15 ENCOUNTER — Other Ambulatory Visit: Payer: Self-pay | Admitting: *Deleted

## 2016-09-15 DIAGNOSIS — E669 Obesity, unspecified: Secondary | ICD-10-CM

## 2016-09-19 ENCOUNTER — Ambulatory Visit
Admission: RE | Admit: 2016-09-19 | Discharge: 2016-09-19 | Disposition: A | Discharge status: Home / Self Care | Payer: Medicaid Other | Source: Ambulatory Visit | Attending: Obstetrics & Gynecology | Admitting: Obstetrics & Gynecology

## 2016-09-19 DIAGNOSIS — Z3A32 32 weeks gestation of pregnancy: Secondary | ICD-10-CM | POA: Insufficient documentation

## 2016-09-19 DIAGNOSIS — O162 Unspecified maternal hypertension, second trimester: Secondary | ICD-10-CM

## 2016-09-19 DIAGNOSIS — Z6841 Body Mass Index (BMI) 40.0 and over, adult: Secondary | ICD-10-CM | POA: Insufficient documentation

## 2016-09-19 DIAGNOSIS — O09522 Supervision of elderly multigravida, second trimester: Secondary | ICD-10-CM

## 2016-09-19 DIAGNOSIS — Z3689 Encounter for other specified antenatal screening: Secondary | ICD-10-CM | POA: Insufficient documentation

## 2016-09-19 DIAGNOSIS — O99213 Obesity complicating pregnancy, third trimester: Secondary | ICD-10-CM | POA: Diagnosis not present

## 2016-09-19 DIAGNOSIS — F32A Depression, unspecified: Secondary | ICD-10-CM

## 2016-09-19 DIAGNOSIS — E669 Obesity, unspecified: Secondary | ICD-10-CM | POA: Diagnosis not present

## 2016-09-19 DIAGNOSIS — O99342 Other mental disorders complicating pregnancy, second trimester: Secondary | ICD-10-CM

## 2016-09-19 DIAGNOSIS — F329 Major depressive disorder, single episode, unspecified: Secondary | ICD-10-CM

## 2016-09-23 ENCOUNTER — Emergency Department: Payer: Medicaid Other

## 2016-09-23 ENCOUNTER — Observation Stay
Admission: EM | Admit: 2016-09-23 | Discharge: 2016-09-24 | Disposition: A | Discharge status: Home / Self Care | Payer: Medicaid Other | Attending: Obstetrics and Gynecology | Admitting: Obstetrics and Gynecology

## 2016-09-23 ENCOUNTER — Encounter: Payer: Self-pay | Admitting: Emergency Medicine

## 2016-09-23 DIAGNOSIS — O99343 Other mental disorders complicating pregnancy, third trimester: Secondary | ICD-10-CM | POA: Diagnosis not present

## 2016-09-23 DIAGNOSIS — R0789 Other chest pain: Secondary | ICD-10-CM | POA: Insufficient documentation

## 2016-09-23 DIAGNOSIS — O99342 Other mental disorders complicating pregnancy, second trimester: Secondary | ICD-10-CM

## 2016-09-23 DIAGNOSIS — Z3A32 32 weeks gestation of pregnancy: Secondary | ICD-10-CM | POA: Insufficient documentation

## 2016-09-23 DIAGNOSIS — F32A Depression, unspecified: Secondary | ICD-10-CM

## 2016-09-23 DIAGNOSIS — E669 Obesity, unspecified: Secondary | ICD-10-CM | POA: Insufficient documentation

## 2016-09-23 DIAGNOSIS — R079 Chest pain, unspecified: Secondary | ICD-10-CM

## 2016-09-23 DIAGNOSIS — R42 Dizziness and giddiness: Secondary | ICD-10-CM | POA: Insufficient documentation

## 2016-09-23 DIAGNOSIS — Z87891 Personal history of nicotine dependence: Secondary | ICD-10-CM | POA: Diagnosis not present

## 2016-09-23 DIAGNOSIS — Z7982 Long term (current) use of aspirin: Secondary | ICD-10-CM | POA: Insufficient documentation

## 2016-09-23 DIAGNOSIS — O99213 Obesity complicating pregnancy, third trimester: Secondary | ICD-10-CM | POA: Diagnosis not present

## 2016-09-23 DIAGNOSIS — O26893 Other specified pregnancy related conditions, third trimester: Principal | ICD-10-CM | POA: Insufficient documentation

## 2016-09-23 DIAGNOSIS — R918 Other nonspecific abnormal finding of lung field: Secondary | ICD-10-CM | POA: Insufficient documentation

## 2016-09-23 DIAGNOSIS — R51 Headache: Secondary | ICD-10-CM | POA: Insufficient documentation

## 2016-09-23 DIAGNOSIS — R000 Tachycardia, unspecified: Secondary | ICD-10-CM

## 2016-09-23 DIAGNOSIS — Z6841 Body Mass Index (BMI) 40.0 and over, adult: Secondary | ICD-10-CM | POA: Insufficient documentation

## 2016-09-23 DIAGNOSIS — F329 Major depressive disorder, single episode, unspecified: Secondary | ICD-10-CM | POA: Insufficient documentation

## 2016-09-23 DIAGNOSIS — O162 Unspecified maternal hypertension, second trimester: Secondary | ICD-10-CM

## 2016-09-23 LAB — URINE DRUG SCREEN, QUALITATIVE (ARMC ONLY)
AMPHETAMINES, UR SCREEN: NOT DETECTED
Barbiturates, Ur Screen: NOT DETECTED
Benzodiazepine, Ur Scrn: NOT DETECTED
CANNABINOID 50 NG, UR ~~LOC~~: NOT DETECTED
Cocaine Metabolite,Ur ~~LOC~~: NOT DETECTED
MDMA (Ecstasy)Ur Screen: NOT DETECTED
METHADONE SCREEN, URINE: NOT DETECTED
Opiate, Ur Screen: POSITIVE — AB
Phencyclidine (PCP) Ur S: NOT DETECTED
Tricyclic, Ur Screen: NOT DETECTED

## 2016-09-23 LAB — CBC
HCT: 34.6 % — ABNORMAL LOW (ref 35.0–47.0)
Hemoglobin: 11.4 g/dL — ABNORMAL LOW (ref 12.0–16.0)
MCH: 26.5 pg (ref 26.0–34.0)
MCHC: 32.8 g/dL (ref 32.0–36.0)
MCV: 80.7 fL (ref 80.0–100.0)
PLATELETS: 338 10*3/uL (ref 150–440)
RBC: 4.29 MIL/uL (ref 3.80–5.20)
RDW: 15.2 % — AB (ref 11.5–14.5)
WBC: 9.9 10*3/uL (ref 3.6–11.0)

## 2016-09-23 LAB — BASIC METABOLIC PANEL
Anion gap: 9 (ref 5–15)
BUN: 11 mg/dL (ref 6–20)
CHLORIDE: 106 mmol/L (ref 101–111)
CO2: 23 mmol/L (ref 22–32)
CREATININE: 0.64 mg/dL (ref 0.44–1.00)
Calcium: 8.8 mg/dL — ABNORMAL LOW (ref 8.9–10.3)
Glucose, Bld: 112 mg/dL — ABNORMAL HIGH (ref 65–99)
Potassium: 3.5 mmol/L (ref 3.5–5.1)
SODIUM: 138 mmol/L (ref 135–145)

## 2016-09-23 LAB — TROPONIN I: TROPONIN I: 0.06 ng/mL — AB (ref <0.03)

## 2016-09-23 MED ORDER — CALCIUM CARBONATE ANTACID 500 MG PO CHEW: 2.0000 | CHEWABLE_TABLET | ORAL | Status: DC | PRN

## 2016-09-23 MED ORDER — ONDANSETRON HCL 4 MG/2ML IJ SOLN
4.0000 mg | Freq: Once | INTRAMUSCULAR | Status: AC
  Administered 2016-09-23: 4 mg via INTRAVENOUS
  Filled 2016-09-23: qty 2

## 2016-09-23 MED ORDER — ACETAMINOPHEN 325 MG PO TABS
650.0000 mg | ORAL_TABLET | ORAL | Status: DC | PRN
  Filled 2016-09-23: qty 2

## 2016-09-23 MED ORDER — DOCUSATE SODIUM 100 MG PO CAPS
100.0000 mg | ORAL_CAPSULE | Freq: Every day | ORAL | Status: DC
  Administered 2016-09-23 – 2016-09-24 (×2): 100 mg via ORAL
  Filled 2016-09-23: qty 1

## 2016-09-23 MED ORDER — PRENATAL MULTIVITAMIN CH
1.0000 | ORAL_TABLET | Freq: Every day | ORAL | Status: DC
  Administered 2016-09-24: 1 via ORAL
  Filled 2016-09-23: qty 1

## 2016-09-23 MED ORDER — IOPAMIDOL (ISOVUE-370) INJECTION 76%
75.0000 mL | Freq: Once | INTRAVENOUS | Status: AC | PRN
  Administered 2016-09-23: 75 mL via INTRAVENOUS

## 2016-09-23 MED ORDER — ASPIRIN 81 MG PO CHEW
81.0000 mg | CHEWABLE_TABLET | Freq: Every day | ORAL | Status: DC
  Administered 2016-09-24: 81 mg via ORAL
  Filled 2016-09-23: qty 1

## 2016-09-23 MED ORDER — MORPHINE SULFATE (PF) 4 MG/ML IV SOLN
4.0000 mg | Freq: Once | INTRAVENOUS | Status: AC
  Administered 2016-09-23: 4 mg via INTRAVENOUS
  Filled 2016-09-23: qty 1

## 2016-09-23 MED ORDER — SODIUM CHLORIDE 0.9 % IV BOLUS (SEPSIS)
1000.0000 mL | Freq: Once | INTRAVENOUS | Status: AC
  Administered 2016-09-23: 1000 mL via INTRAVENOUS

## 2016-09-23 MED ORDER — ACETAMINOPHEN 325 MG PO TABS
650.0000 mg | ORAL_TABLET | ORAL | Status: DC | PRN
  Administered 2016-09-23 – 2016-09-24 (×3): 650 mg via ORAL
  Filled 2016-09-23 (×2): qty 2

## 2016-09-23 NOTE — ED Provider Notes (Signed)
[facility] Emergency Department Provider Note  ____________________________________________   First MD Initiated Contact with Patient 09/23/16 1726     (approximate)  I have reviewed the triage vital signs and the nursing notes.   HISTORY  Chief Complaint Chest Pain   HPI Lauren [name] is a 41 y.o. female  who is 7-1/2 months pregnant who is presenting to the emergency department today with sudden onset chest pain that started at 5 PM. Says that she feels her heart racing. She says that the pain in her chest feels as a tightness and radiates into her left shoulder. She is experiencing mild shortness of breath. Pain does not worsen with deep breathing. She does feel her heart racing and tried to bear down which did not slow the heart rate. She said that she had a similar experience in previous pregnancies and 2010 and 2011 that resulted in emergency cesarean sections because of swelling of the fetal heart rate. She does not report any rush of fluid or vaginal bleeding. No abdominal pain and she is able to feel baby moving. No history of pulmonary embolus. Says that she does have a history of SVT.   Past Medical History:  Diagnosis Date  . Depression   . Headache   . Hypertension   . Tachycardia     Patient Active Problem List   Diagnosis Date Noted  . Hypertension complicating pregnancy 99991111  . Obesity 05/30/2016  . Advanced maternal age in multigravida 05/30/2016  . Depression affecting pregnancy in second trimester, antepartum 05/30/2016    Past Surgical History:  Procedure Laterality Date  . CESAREAN SECTION    . CHOLECYSTECTOMY    . KNEE SURGERY    . ROTATOR CUFF REPAIR      Prior to Admission medications   Medication Sig Start Date End Date Taking? Authorizing Provider  acetaminophen (TYLENOL) 325 MG tablet Take 650 mg by mouth every 6 (six) hours as needed.    Historical Provider, MD  aspirin EC 81 MG tablet Take 81 mg by mouth  daily.    Historical Provider, MD  esomeprazole (NEXIUM) 40 MG capsule Take 40 mg by mouth daily at 12 noon.    Historical Provider, MD  HYDROcodone-acetaminophen (NORCO/VICODIN) 5-325 MG tablet Take 1 tablet by mouth every 6 (six) hours as needed for moderate pain.    Historical Provider, MD  Prenatal Vit-Fe Fumarate-FA (MULTIVITAMIN-PRENATAL) 27-0.8 MG TABS tablet Take 1 tablet by mouth daily at 12 noon.    Historical Provider, MD    Allergies Nucynta [tapentadol]  No family history on file.  Social History Social History  Substance Use Topics  . Smoking status: Former Smoker    Types: Cigarettes  . Smokeless tobacco: Never Used     Comment: occasional smoker  . Alcohol use No    Review of Systems Constitutional: No fever/chills Eyes: No visual changes. ENT: No sore throat. Cardiovascular: as above Respiratory: as above Gastrointestinal: No abdominal pain.  No nausea, no vomiting.  No diarrhea.  No constipation. Genitourinary: Negative for dysuria. Musculoskeletal: Negative for back pain. Skin: Negative for rash. Neurological: Negative for headaches, focal weakness or numbness.  10-point ROS otherwise negative.  ____________________________________________   PHYSICAL EXAM:  VITAL SIGNS: ED Triage Vitals  Enc Vitals Group     BP 09/23/16 1722 138/76     Pulse Rate 09/23/16 1722 (!) 140     Resp 09/23/16 1722 20     Temp 09/23/16 1722 98.5 F (36.9 C)  Temp Source 09/23/16 1722 Oral     SpO2 09/23/16 1722 97 %     Weight 09/23/16 1722 (!) 301 lb (136.5 kg)     Height 09/23/16 1722 5\' 5"  (1.651 m)     Head Circumference --      Peak Flow --      Pain Score 09/23/16 1723 5     Pain Loc --      Pain Edu? --      Excl. in [place]? --     Constitutional: Alert and oriented. Well appearing and in no acute distress. Eyes: Conjunctivae are normal. PERRL. EOMI. Head: Atraumatic. Nose: No congestion/rhinnorhea. Mouth/Throat: Mucous membranes are moist.   Neck: No  stridor.   Cardiovascular: tachycardic, regular rhythm. Grossly normal heart sounds.  Good peripheral circulation with intact, equal bilateral radial as well as dorsalis pedis pulses. Respiratory: Normal respiratory effort.  No retractions. Lungs CTAB. Gastrointestinal: Soft and nontender. Gravid abdomen consistent with dates. Musculoskeletal: No lower extremity tenderness nor edema.  No joint effusions. Neurologic:  Normal speech and language. No gross focal neurologic deficits are appreciated. Skin:  Skin is warm, dry and intact. No rash noted. Psychiatric: Mood and affect are normal. Speech and behavior are normal.  ____________________________________________   LABS (all labs ordered are listed, but only abnormal results are displayed)  Labs Reviewed  BASIC METABOLIC PANEL - Abnormal; Notable for the following:       Result Value   Glucose, Bld 112 (*)    Calcium 8.8 (*)    All other components within normal limits  CBC - Abnormal; Notable for the following:    Hemoglobin 11.4 (*)    HCT 34.6 (*)    RDW 15.2 (*)    All other components within normal limits  TROPONIN I   ____________________________________________  EKG  ED ECG REPORT I, Doran Stabler, the attending physician, personally viewed and interpreted this ECG.   Date: 09/23/2016  EKG Time: 1717  Rate: 131  Rhythm: sinus tachycardia  Axis: Normal  Intervals:none  ST&T Change: No ST segment elevation or depression. No abnormal T-wave inversion.  ____________________________________________  RADIOLOGY  CT Angio Chest PE W and/or Wo Contrast (Final result)  Result time 09/23/16 19:21:16  Final result by Kristine Garbe, MD (09/23/16 19:21:16)           Narrative:   CLINICAL DATA: 41 y/o F; chest pain and dizziness. Pregnant patient 32 weeks.  EXAM: CT ANGIOGRAPHY CHEST WITH CONTRAST  TECHNIQUE: Multidetector CT imaging of the chest was performed using the standard protocol during  bolus administration of intravenous contrast. Multiplanar CT image reconstructions and MIPs were obtained to evaluate the vascular anatomy.  CONTRAST: 75 cc Isovue 370.  COMPARISON: 12/27/2015 chest radiograph  FINDINGS: Cardiovascular: Preferential opacification of the thoracic aorta. No evidence of thoracic aortic aneurysm or dissection. Mild cardiac enlargement is likely due to a hyperdynamic state of pregnancy. No pericardial effusion. Poor opacification of the pulmonary arteries. No central pulmonary embolus. No evidence of right heart strain.  Mediastinum/Nodes: No enlarged mediastinal, hilar, or axillary lymph nodes. Thyroid gland, trachea, and esophagus demonstrate no significant findings.  Lungs/Pleura: Dependent mosaic attenuation of the lungs is probably due to air trapping in atelectasis. No evidence for pneumonia or pleural effusion. No pneumothorax. The  Upper Abdomen: No acute abnormality.  Musculoskeletal: No chest wall abnormality. No acute or significant osseous findings.  Review of the MIP images confirms the above findings.  IMPRESSION: 1. Poor opacification of pulmonary arteries.  No central pulmonary embolus. No evidence of right heart strain. Suboptimal evaluation downstream to central pulmonary arteries. 2. Mild cardiac enlargement is likely due to a hyperdynamic state of pregnancy. 3. No evidence of pneumonia, pleural effusion, or pneumothorax.   Electronically Signed By: Kristine Garbe M.D. On: 09/23/2016 19:21            ____________________________________________   PROCEDURES  Procedure(s) performed:   Procedures  Critical Care performed:   ____________________________________________   INITIAL IMPRESSION / ASSESSMENT AND PLAN / ED COURSE  Pertinent labs & imaging results that were available during my care of the patient were reviewed by me and considered in my medical decision making (see chart for  details).   Clinical Course   Discussed with patient the need for a CAT scan of the chest with contrast to test for pulmonary embolus. Patient understands that there is a radiation risk to the child but that it is minimal and that we will shield her abdomen and that it is very important that we make sure that she is not having an acute cardiac or pulmonary event.  ----------------------------------------- 7:36 PM on 09/23/2016 -----------------------------------------  Patient is pain-free and heart rate is down to the low 100s after fluids as well as pain medication. Reassuring CAT scan. Fetal heart tones also in the 140s. Patient to be transferred to labor and delivery for further monitoring. Discussed the case with Dr. Georgianne Fick who accepted the patient to labor and delivery. He will also be repeating a troponin 1/2-2 hours. ____________________________________________   FINAL CLINICAL IMPRESSION(S) / ED DIAGNOSES   Chest pain. Tachycardia.   NEW MEDICATIONS STARTED DURING THIS VISIT:  New Prescriptions   No medications on file     Note:  This document was prepared using Dragon voice recognition software and may include unintentional dictation errors.    Orbie Pyo, MD 09/23/16 [phone]

## 2016-09-23 NOTE — OB Triage Note (Addendum)
Patient came in complaining of chest pain, cleared by Er. Patient also complaining of headache x3 days, black spots in field of vision and heart racing.

## 2016-09-23 NOTE — H&P (Signed)
Obstetrics & Gynecology H&P    Chief Consult: Chest pain  History of Present Illness: Patient is a 41 y.o. [id number] at 48w3dwith pregnancy notable for history chronic hypertension (unmedicated), history of two prior cesarean sections, chronic headaches, and tachycardia who presented to the emergency department this evening with complaints of chest pain which started today, shortness of breath, left arm numbness and headache. No nausea, emesis, or diaphoresis.  She was evaluated in the ED with EKG showing sinus tachycardia, normal troponin, and negative CTA for PE.  She was transferred to L&D for fetal monitoring with was reassuring.  Repeat troponin did show a slight bump although her chest symptoms have completely resolved.  Only complaint at present is continued headache 4/10.  Reports +FM, no LOF, no VB, no ctx.  Review of Systems:10 point review of systems  Past Medical History:  Past Medical History:  Diagnosis Date  . Depression   . Headache   . Hypertension   . Tachycardia     Past Surgical History:  Past Surgical History:  Procedure Laterality Date  . CESAREAN SECTION    . CHOLECYSTECTOMY    . KNEE SURGERY    . ROTATOR CUFF REPAIR      Family History:  History reviewed. No pertinent family history.  Social History:  Social History   Social History  . Marital status: Single    Spouse name: N/A  . Number of children: N/A  . Years of education: N/A   Occupational History  . Not on file.   Social History Main Topics  . Smoking status: Former Smoker    Types: Cigarettes  . Smokeless tobacco: Never Used     Comment: occasional smoker  . Alcohol use No  . Drug use: No  . Sexual activity: Yes   Other Topics Concern  . Not on file   Social History Narrative  . No narrative on file    Allergies:  Allergies  Allergen Reactions  . Nucynta [Tapentadol] Hives    Medications: Prior to Admission medications   Medication Sig Start Date End Date Taking?  Authorizing Provider  acetaminophen (TYLENOL) 325 MG tablet Take 650 mg by mouth every 6 (six) hours as needed.   Yes Historical Provider, MD  esomeprazole (NEXIUM) 40 MG capsule Take 40 mg by mouth daily at 12 noon.   Yes Historical Provider, MD  HYDROcodone-acetaminophen (NORCO/VICODIN) 5-325 MG tablet Take 1 tablet by mouth every 6 (six) hours as needed for moderate pain.   Yes Historical Provider, MD  Prenatal Vit-Fe Fumarate-FA (MULTIVITAMIN-PRENATAL) 27-0.8 MG TABS tablet Take 1 tablet by mouth daily at 12 noon.   Yes Historical Provider, MD  aspirin EC 81 MG tablet Take 81 mg by mouth daily.    Historical Provider, MD    Physical Exam Vitals: Blood pressure (!) 119/57, pulse 97, temperature 98.6 F (37 C), temperature source Oral, resp. rate 18, height '5\' 5"'$  (1.651 m), weight (!) 301 lb (136.5 kg), last menstrual period 01/13/2016, SpO2 100 %.  FHT: 135, moderate variability, +accels no decels Toco: none  General: NAD HEENT: Normocephalic, anicteric Pulmonary: no increased work of breathing Cardiovascular: RRR Abdomen: gravid, non-tender Genitourinary: deferred Extremities: trace BLE edema Neurologic: grossly intact Psychiatric: mood appropriate, affect full  Labs: Results for orders placed or performed during the hospital encounter of 09/23/16 (from the past 72 hour(s))  Basic metabolic panel     Status: Abnormal   Collection Time: 09/23/16  6:01 PM  Result Value Ref Range  Sodium 138 135 - 145 mmol/L   Potassium 3.5 3.5 - 5.1 mmol/L   Chloride 106 101 - 111 mmol/L   CO2 23 22 - 32 mmol/L   Glucose, Bld 112 (H) 65 - 99 mg/dL   BUN 11 6 - 20 mg/dL   Creatinine, Ser 0.64 0.44 - 1.00 mg/dL   Calcium 8.8 (L) 8.9 - 10.3 mg/dL   GFR calc non Af Amer >60 >60 mL/min   GFR calc Af Amer >60 >60 mL/min    Comment: (NOTE) The eGFR has been calculated using the CKD EPI equation. This calculation has not been validated in all clinical situations. eGFR's persistently <60 mL/min  signify possible Chronic Kidney Disease.    Anion gap 9 5 - 15  CBC     Status: Abnormal   Collection Time: 09/23/16  6:01 PM  Result Value Ref Range   WBC 9.9 3.6 - 11.0 K/uL   RBC 4.29 3.80 - 5.20 MIL/uL   Hemoglobin 11.4 (L) 12.0 - 16.0 g/dL   HCT 34.6 (L) 35.0 - 47.0 %   MCV 80.7 80.0 - 100.0 fL   MCH 26.5 26.0 - 34.0 pg   MCHC 32.8 32.0 - 36.0 g/dL   RDW 15.2 (H) 11.5 - 14.5 %   Platelets 338 150 - 440 K/uL  Troponin I     Status: None   Collection Time: 09/23/16  6:01 PM  Result Value Ref Range   Troponin I <0.03 <0.03 ng/mL  Troponin I     Status: Abnormal   Collection Time: 09/23/16  8:50 PM  Result Value Ref Range   Troponin I 0.06 (HH) <0.03 ng/mL    Comment: CRITICAL RESULT CALLED TO, READ BACK BY AND VERIFIED WITH MOLLY TROTT 09/23/16 @ 2122  MLK     Imaging Ct Angio Chest Pe W And/or Wo Contrast  Result Date: 09/23/2016 CLINICAL DATA:  41 y/o F; chest pain and dizziness. Pregnant patient 32 weeks. EXAM: CT ANGIOGRAPHY CHEST WITH CONTRAST TECHNIQUE: Multidetector CT imaging of the chest was performed using the standard protocol during bolus administration of intravenous contrast. Multiplanar CT image reconstructions and MIPs were obtained to evaluate the vascular anatomy. CONTRAST:  75 cc Isovue 370. COMPARISON:  12/27/2015 chest radiograph FINDINGS: Cardiovascular: Preferential opacification of the thoracic aorta. No evidence of thoracic aortic aneurysm or dissection. Mild cardiac enlargement is likely due to a hyperdynamic state of pregnancy. No pericardial effusion. Poor opacification of the pulmonary arteries. No central pulmonary embolus. No evidence of right heart strain. Mediastinum/Nodes: No enlarged mediastinal, hilar, or axillary lymph nodes. Thyroid gland, trachea, and esophagus demonstrate no significant findings. Lungs/Pleura: Dependent mosaic attenuation of the lungs is probably due to air trapping in atelectasis. No evidence for pneumonia or pleural effusion.  No pneumothorax. The Upper Abdomen: No acute abnormality. Musculoskeletal: No chest wall abnormality. No acute or significant osseous findings. Review of the MIP images confirms the above findings. IMPRESSION: 1. Poor opacification of pulmonary arteries. No central pulmonary embolus. No evidence of right heart strain. Suboptimal evaluation downstream to central pulmonary arteries. 2. Mild cardiac enlargement is likely due to a hyperdynamic state of pregnancy. 3. No evidence of pneumonia, pleural effusion, or pneumothorax. Electronically Signed   By: Kristine Garbe M.D.   On: 09/23/2016 19:21   [place] [facility] Ob Follow Up  Result Date: 09/19/2016 ----------------------------------------------------------------------  OBSTETRICS REPORT                        (Corrected  Final 09/19/2016 02:13                                                                          pm) ---------------------------------------------------------------------- PATIENT INFO:  ID #:       [id number]                          D.O.B.:  May 28, 1976 (40 yrs)  Name:       Lauren [name]                 Visit Date: 09/19/2016 01:09 pm ---------------------------------------------------------------------- PERFORMED BY:  Performed By:     Marthenia Rolling         Ref. Address:      [address], [place],                                                              [place] [address]  Referred By:      Burlene Arnt CNM ---------------------------------------------------------------------- SERVICE(S) PROVIDED:   [place] [facility] OB FOLLOW UP                                   [phone]  ---------------------------------------------------------------------- INDICATIONS:   [redacted] weeks gestation of pregnancy                Z3A.32  ---------------------------------------------------------------------- FETAL EVALUATION:  Num Of Fetuses:     1  Fetal Heart         144  Rate(bpm):   Presentation:       Cephalic  Placenta:           Anterior ---------------------------------------------------------------------- BIOMETRY:  BPD:      80.7  mm     G. Age:  32w 3d         54  %    CI:        77.49   %    70 - 86                                                          FL/HC:       21.3  %    19.1 - 21.3  HC:      290.2  mm     G. Age:  32w 0d         15  %    HC/AC:       1.03       0.96 - 1.17  AC:      281.5  mm     G. Age:  32w 1d         54  %    FL/BPD:      76.7  %    71 - 87  FL:       61.9  mm     G. Age:  32w 0d         40  %    FL/AC:       22.0  %    20 - 24  HUM:      56.5  mm     G. Age:  32w 6d         67  %  Est. FW:    1914   gm     4 lb 4 oz     45  % ---------------------------------------------------------------------- GESTATIONAL AGE:  U/S Today:     32w 1d                                        EDD:   11/13/16  Best:          32w 0d     Det. ByLoman Chroman         EDD:   11/14/16                                      (03/25/16) ---------------------------------------------------------------------- ANATOMY:  Cavum:                 Visualized             Ductal Arch:            Normal appearance                         previously  Ventricles:            Normal appearance      Stomach:                Seen  Choroid Plexus:        Within Normal Limits   Abdomen:                Normal appearance  Cerebellum:            Visualized             Abdominal Wall:         Visualized                         previously                                     previously  Posterior Fossa:       Visualized             Cord Vessels:           3 vessels,  previously                                     visualized previously  Face:                  Orbits visualized      Kidneys:                Normal appearance                         previously  Lips:                  Visualized             Bladder:                Seen                         previously  Heart:                  4-Chamber view         Spine:                  Within Normal Limits                         appears normal  RVOT:                  Normal appearance      Upper Extremities:      Visualized                                                                        previously  LVOT:                  Normal appearance      Lower Extremities:      Visualized                                                                        previously  Aortic Arch:           Normal appearance ---------------------------------------------------------------------- CERVIX UTERUS ADNEXA:  Cervix  Length:            4.2  cm. ---------------------------------------------------------------------- IMPRESSION:  Dear Dr. Purcell Mouton,  Thank you for referring your patient to [facility] for a fetal growth evaluation.  There is a live singleton gestation at  62 0/7.  Dating is based  on earliest available ultrasound performed on 03/25/16 at  [facility] with measurements of 6 4/7 weeks.  The fetal growth  is apporpriate and there is normal amniotic fluid volume.  The estimated fetal weight is at the 45th percentile. AGA.  The aortic and ductal arches are seem today and appear  normal.   All other anatomy appears normal or was previously  visualized.  Recommend follow-up scan for fetal growth in 4 weeks (36  weeks) at which time we also recommend to initiate weekly  fetal testing via biophysical profile or NST/AFI.  Thank you for allowing [place] to participate in your patient's care.  Please do not hesitate to contact us if we can be of further  assistance. ----------------------------------------------------------------------                     Suzanne Boron, MD Electronically Signed Corrected Final Report  09/19/2016 02:13 pm ----------------------------------------------------------------------   Assessment: 41 y.o. [id number] at 57w3dwith troponin elevation noted during work up of chest pain and shortness of  breath  Plan: 1) Troponin bump  - Discussed with Dr. KNehemiah Massed mild troponin elevation with resolution in her presenting symptoms.  Maybe secondary to SVT on initial presentation.  Will monitor on telemetry overnight and repeat troponin in AM. - restart patient home dose of '81mg'$  ASA - UDS sent  - cardiology to see patient in AM  2) Fetus - reactive category I tracing - anticipate discharge tomorrow AM may repeat heart tones prior to discharge   3) FEN - heart healthy low sodium diet - tolerating po   4) Anticipated discharge - anticipate discharge tomorrow AM if troponin normalizes and cardiology clears patient

## 2016-09-23 NOTE — ED Triage Notes (Signed)
Pt reports CP and dizziness that began today. Pt is [redacted] weeks pregnant.

## 2016-09-23 NOTE — Progress Notes (Signed)
Patient and belongings transferred via wheelchair to room 250 without incident. Report called to floor, RN denies questions at this time.

## 2016-09-24 LAB — TROPONIN I: Troponin I: 0.04 ng/mL (ref <0.03)

## 2016-09-24 NOTE — Discharge Instructions (Signed)
Chest Wall Pain °Introduction °Chest wall pain is pain in or around the bones and muscles of your chest. Sometimes, an injury causes this pain. Sometimes, the cause may not be known. This pain may take several weeks or longer to get better. °Follow these instructions at home: °Pay attention to any changes in your symptoms. Take these actions to help with your pain: °· Rest as told by your doctor. °· Avoid activities that cause pain. Try not to use your chest, belly (abdominal), or side muscles to lift heavy things. °· If directed, apply ice to the painful area: °¨ Put ice in a plastic bag. °¨ Place a towel between your skin and the bag. °¨ Leave the ice on for 20 minutes, 2-3 times per day. °· Take over-the-counter and prescription medicines only as told by your doctor. °· Do not use tobacco products, including cigarettes, chewing tobacco, and e-cigarettes. If you need help quitting, ask your doctor. °· Keep all follow-up visits as told by your doctor. This is important. °Contact a doctor if: °· You have a fever. °· Your chest pain gets worse. °· You have new symptoms. °Get help right away if: °· You feel sick to your stomach (nauseous) or you throw up (vomit). °· You feel sweaty or light-headed. °· You have a cough with phlegm (sputum) or you cough up blood. °· You are short of breath. °This information is not intended to replace advice given to you by your health care provider. Make sure you discuss any questions you have with your health care provider. °Document Released: 02/15/2008 Document Revised: 02/04/2016 Document Reviewed: 11/24/2014 °© 2017 [employer] ° °

## 2016-09-24 NOTE — Progress Notes (Signed)
Pt arrived from labor and delivery via wheelchair. No SOB, moderate headache reported at 7/10. Telemetry box and skin verified with Yasmin RN . No skin issues noted. Pt oriented to staff and equipment. No concerns offered at this time.

## 2016-09-24 NOTE — Consult Note (Signed)
[facility] Consultation Note  Patient ID: Lauren [name], MRN: [id number], DOB/AGE: Feb 08, 1976 41 y.o. Admit date: 09/23/2016   Date of Consult: 09/24/2016 Primary Physician: Hoyt Koch, MD Primary Cardiologist: None  Chief Complaint:  Chief Complaint  Patient presents with  . Chest Pain  . Headache   Reason for Consult: chest pain  HPI: 41 y.o. female with the current pregnancy having borderline hypertension not treated with medication management at this time with acute onset of the sharp left upper chest discomfort intermittent and lasting only a few minutes at a time with some shortness of breath and mild diaphoresis at times but not sustained over the last 24-48 hours. The patient has had a headache as well with some sinus tachycardia. Current EKG shows sinus tachycardia and telemetry overnight has shown normal sinus rhythm. Troponin did elevate to 0.06 more consistent with demand ischemia rather than acute coronary syndrome and the patient has had resolution of chest pain at this time. The patient has very few if no risk factors for cardiovascular disease and currently is at low risk for continued pregnancy as well as delivery.  Past Medical History:  Diagnosis Date  . Depression   . Headache   . Hypertension   . Tachycardia       Surgical History:  Past Surgical History:  Procedure Laterality Date  . CESAREAN SECTION    . CHOLECYSTECTOMY    . KNEE SURGERY    . ROTATOR CUFF REPAIR       Home Meds: Prior to Admission medications   Medication Sig Start Date End Date Taking? Authorizing Provider  acetaminophen (TYLENOL) 325 MG tablet Take 650 mg by mouth every 6 (six) hours as needed.   Yes Historical Provider, MD  esomeprazole (NEXIUM) 40 MG capsule Take 40 mg by mouth daily at 12 noon.   Yes Historical Provider, MD  HYDROcodone-acetaminophen (NORCO/VICODIN) 5-325 MG tablet Take 1 tablet by mouth every 6 (six) hours as needed for moderate pain.   Yes  Historical Provider, MD  Prenatal Vit-Fe Fumarate-FA (MULTIVITAMIN-PRENATAL) 27-0.8 MG TABS tablet Take 1 tablet by mouth daily at 12 noon.   Yes Historical Provider, MD  aspirin EC 81 MG tablet Take 81 mg by mouth daily.    Historical Provider, MD    Inpatient Medications:  . aspirin  81 mg Oral Daily  . docusate sodium  100 mg Oral Daily  . prenatal multivitamin  1 tablet Oral Q1200     Allergies:  Allergies  Allergen Reactions  . Nucynta [Tapentadol] Hives    Social History   Social History  . Marital status: Single    Spouse name: N/A  . Number of children: N/A  . Years of education: N/A   Occupational History  . Not on file.   Social History Main Topics  . Smoking status: Former Smoker    Types: Cigarettes  . Smokeless tobacco: Never Used     Comment: occasional smoker  . Alcohol use No  . Drug use: No  . Sexual activity: Yes   Other Topics Concern  . Not on file   Social History Narrative  . No narrative on file     History reviewed. No pertinent family history.   Review of Systems Positive for Chest pain shortness of breath headache Negative for: General:  chills, fever, night sweats or weight changes.  Cardiovascular: PND orthopnea syncope dizziness  Dermatological skin lesions rashes Respiratory: Cough congestion Urologic: Frequent urination urination at night and hematuria  Abdominal: negative for nausea, vomiting, diarrhea, bright red blood per rectum, melena, or hematemesis Neurologic: negative for visual changes, and/or hearing changes  All other systems reviewed and are otherwise negative except as noted above.  Labs:  Recent Labs  09/23/16 1801 09/23/16 2050 09/24/16 0544  TROPONINI <0.03 0.06* 0.04*   Lab Results  Component Value Date   WBC 9.9 09/23/2016   HGB 11.4 (L) 09/23/2016   HCT 34.6 (L) 09/23/2016   MCV 80.7 09/23/2016   PLT 338 09/23/2016    Recent Labs Lab 09/23/16 1801  NA 138  K 3.5  CL 106  CO2 23  BUN 11   CREATININE 0.64  CALCIUM 8.8*  GLUCOSE 112*   No results found for: CHOL, HDL, LDLCALC, TRIG No results found for: DDIMER  Radiology/Studies:  Ct Angio Chest Pe W And/or Wo Contrast  Result Date: 09/23/2016 CLINICAL DATA:  40 y/o F; chest pain and dizziness. Pregnant patient 32 weeks. EXAM: CT ANGIOGRAPHY CHEST WITH CONTRAST TECHNIQUE: Multidetector CT imaging of the chest was performed using the standard protocol during bolus administration of intravenous contrast. Multiplanar CT image reconstructions and MIPs were obtained to evaluate the vascular anatomy. CONTRAST:  75 cc Isovue 370. COMPARISON:  12/27/2015 chest radiograph FINDINGS: Cardiovascular: Preferential opacification of the thoracic aorta. No evidence of thoracic aortic aneurysm or dissection. Mild cardiac enlargement is likely due to a hyperdynamic state of pregnancy. No pericardial effusion. Poor opacification of the pulmonary arteries. No central pulmonary embolus. No evidence of right heart strain. Mediastinum/Nodes: No enlarged mediastinal, hilar, or axillary lymph nodes. Thyroid gland, trachea, and esophagus demonstrate no significant findings. Lungs/Pleura: Dependent mosaic attenuation of the lungs is probably due to air trapping in atelectasis. No evidence for pneumonia or pleural effusion. No pneumothorax. The Upper Abdomen: No acute abnormality. Musculoskeletal: No chest wall abnormality. No acute or significant osseous findings. Review of the MIP images confirms the above findings. IMPRESSION: 1. Poor opacification of pulmonary arteries. No central pulmonary embolus. No evidence of right heart strain. Suboptimal evaluation downstream to central pulmonary arteries. 2. Mild cardiac enlargement is likely due to a hyperdynamic state of pregnancy. 3. No evidence of pneumonia, pleural effusion, or pneumothorax. Electronically Signed   By: Kristine Garbe M.D.   On: 09/23/2016 19:21   [place] [facility] Ob Follow Up  Result Date:  09/19/2016 ----------------------------------------------------------------------  OBSTETRICS REPORT                        (Corrected Final 09/19/2016 02:13                                                                          pm) ---------------------------------------------------------------------- PATIENT INFO:  ID #:       [id number]                          D.O.B.:  02-25-1976 (40 yrs)  Name:       Lauren [name]                 Visit Date: 09/19/2016 01:09 pm ---------------------------------------------------------------------- PERFORMED BY:  Performed By:     Marthen[facility]  Ref. Address:      [address] [place],                                                              [place] [address]  Referred By:      Burlene Arnt CNM ---------------------------------------------------------------------- SERVICE(S) PROVIDED:   [place] [facility] OB FOLLOW UP                                   [phone]  ---------------------------------------------------------------------- INDICATIONS:   [redacted] weeks gestation of pregnancy                Z3A.32  ---------------------------------------------------------------------- FETAL EVALUATION:  Num Of Fetuses:     1  Fetal Heart         144  Rate(bpm):  Presentation:       Cephalic  Placenta:           Anterior ---------------------------------------------------------------------- BIOMETRY:  BPD:      80.7  mm     G. Age:  32w 3d         54  %    CI:        77.49   %    70 - 86                                                          FL/HC:       21.3  %    19.1 - 21.3  HC:      290.2  mm     G. Age:  32w 0d         15  %    HC/AC:       1.03       0.96 - 1.17  AC:      281.5  mm     G. Age:  32w 1d         54  %    FL/BPD:      76.7  %    71 - 87  FL:       61.9  mm     G. Age:  32w 0d         40  %    FL/AC:       22.0  %    20 - 24  HUM:      56.5  mm     G. Age:  32w 6d         67  %  Est. FW:     1914   gm  4 lb 4 oz     45  % ---------------------------------------------------------------------- GESTATIONAL AGE:  U/S Today:     32w 1d                                        EDD:   11/13/16  Best:          32w 0d     Det. ByLoman Chroman         EDD:   11/14/16                                      (03/25/16) ---------------------------------------------------------------------- ANATOMY:  Cavum:                 Visualized             Ductal Arch:            Normal appearance                         previously  Ventricles:            Normal appearance      Stomach:                Seen  Choroid Plexus:        Within Normal Limits   Abdomen:                Normal appearance  Cerebellum:            Visualized             Abdominal Wall:         Visualized                         previously                                     previously  Posterior Fossa:       Visualized             Cord Vessels:           3 vessels,                         previously                                     visualized previously  Face:                  Orbits visualized      Kidneys:                Normal appearance                         previously  Lips:                  Visualized             Bladder:                Seen  previously  Heart:                 4-Chamber view         Spine:                  Within Normal Limits                         appears normal  RVOT:                  Normal appearance      Upper Extremities:      Visualized                                                                        previously  LVOT:                  Normal appearance      Lower Extremities:      Visualized                                                                        previously  Aortic Arch:           Normal appearance ---------------------------------------------------------------------- CERVIX UTERUS ADNEXA:  Cervix  Length:            4.2  cm.  ---------------------------------------------------------------------- IMPRESSION:  Dear Dr. Purcell Mouton,  Thank you for referring your patient to [facility] for a fetal growth evaluation.  There is a live singleton gestation at  93 0/7.  Dating is based  on earliest available ultrasound performed on 03/25/16 at  [facility] with measurements of 6 4/7 weeks.  The fetal growth  is apporpriate and there is normal amniotic fluid volume.  The estimated fetal weight is at the 45th percentile. AGA.  The aortic and ductal arches are seem today and appear  normal.   All other anatomy appears normal or was previously  visualized.  Recommend follow-up scan for fetal growth in 4 weeks (36  weeks) at which time we also recommend to initiate weekly  fetal testing via biophysical profile or NST/AFI.  Thank you for allowing [place] to participate in your patient's care.  Please do not hesitate to contact us if we can be of further  assistance. ----------------------------------------------------------------------                     Suzanne Boron, MD Electronically Signed Corrected Final Report  09/19/2016 02:13 pm ----------------------------------------------------------------------   EKG: Normal sinus rhythm  Weights: Filed Weights   09/23/16 1722 09/23/16 2252  Weight: (!) 136.5 kg (301 lb) (!) 137.6 kg (303 lb 4.8 oz)     Physical Exam: Blood pressure (!) 95/55, pulse 80, temperature 97.8 F (36.6 C), temperature source Oral, resp. rate 18, height 5\' 5"  (1.651 m), weight (!) 137.6 kg (303 lb 4.8 oz), last menstrual period 01/13/2016, SpO2 100 %. Body mass  index is 50.47 kg/m. General: Well developed, well nourished, in no acute distress. Head eyes ears nose throat: Normocephalic, atraumatic, sclera non-icteric, no xanthomas, nares are without discharge. No apparent thyromegaly and/or mass  Lungs: Normal respiratory effort.  no wheezes, no rales, no rhonchi.  Heart: RRR with normal S1 S2. no  murmur gallop, no rub, PMI is normal size and placement, carotid upstroke normal without bruit, jugular venous pressure is normal Abdomen: Soft, non-tender, distended with normoactive bowel sounds. No hepatomegaly. No rebound/guarding. No obvious abdominal masses. Abdominal aorta is normal size without bruit Extremities:Trace edema. no cyanosis, no clubbing, no ulcers  Peripheral : 2+ bilateral upper extremity pulses, 2+ bilateral femoral pulses, 2+ bilateral dorsal pedal pulse Neuro: Alert and oriented. No facial asymmetry. No focal deficit. Moves all extremities spontaneously. Musculoskeletal: Normal muscle tone without kyphosis Psych:  Responds to questions appropriately with a normal affect.    Assessment: 41 year old female pregnant with essential hypertension and new onset atypical chest pain with tachycardia and minimal elevation of troponin of unknown significance without evidence of myocardial infarction or congestive heart failure  Plan: 1. Continue supportive care and monitoring for pregnancy 2. Consideration of echocardiogram either as in or outpatient for further risk stratification prior to delivery in the future 3. Begin ambulation and follow for further significant symptoms of chest pain or others symptoms requiring adjustments of medication management and or other treatment options 4. No further invasive the cardiac Diagnostics necessary at this time  Signed, Corey Skains M.D. [facility] 09/24/2016, 6:42 AM

## 2016-09-24 NOTE — Progress Notes (Signed)
Pt had no relief of headache from tylenol. Spoke to dr Georgianne Fick for something stronger.he declined to order a stronger med.

## 2016-09-24 NOTE — Progress Notes (Signed)
Pt to be discharged today. Iv and tele removed. disch instruction given to pt to her understanding. disch via w.c. To home.

## 2016-09-24 NOTE — Discharge Summary (Signed)
Discharge Summary   Patient ID: Lauren [name] [id number] 41 y.o. [birth year]  Admit date: 09/23/2016  Discharge date: 09/24/2016  Principal Diagnoses:  Patient Active Problem List   Diagnosis Date Noted  . Chest pain 09/23/2016  . Hypertension complicating pregnancy [id number]  . Obesity 05/30/2016  . Advanced maternal age in multigravida 05/30/2016  . Depression affecting pregnancy in second trimester, antepartum 05/30/2016    Secondary Diagnoses:  None  Procedures performed during the hospitalization:  CT PA chest 09/23/16 (report): IMPRESSION: 1. Poor opacification of pulmonary arteries. No central pulmonary embolus. No evidence of right heart strain. Suboptimal evaluation downstream to central pulmonary arteries. 2. Mild cardiac enlargement is likely due to a hyperdynamic state of pregnancy. 3. No evidence of pneumonia, pleural effusion, or pneumothorax.  Telemetry while in the hospital: initially, sinus tachycardia with no ischemic changes.  Later, normal sinus rhythm with no ischemic changes.  HPI: Patient is a 41 y.o. [id number] at [redacted]w[redacted]d with pregnancy notable for history chronic hypertension (unmedicated), history of two prior cesarean sections, chronic headaches, and tachycardia who presented to the emergency department this evening with complaints of chest pain which started today, shortness of breath, left arm numbness and headache. No nausea, emesis, or diaphoresis.  She was evaluated in the ED with EKG showing sinus tachycardia, normal troponin, and negative CTA for PE.  She was transferred to L&D for fetal monitoring with was reassuring.  Repeat troponin did show a slight bump although her chest symptoms have completely resolved.  Only complaint at present is continued headache 4/10.  Reports +FM, no LOF, no VB, no ctx.  Past Medical History:  Diagnosis Date  . Depression   . Headache   . Hypertension   . Tachycardia     Past Surgical History:  Procedure  Laterality Date  . CESAREAN SECTION    . CHOLECYSTECTOMY    . KNEE SURGERY    . ROTATOR CUFF REPAIR      Allergies  Allergen Reactions  . Nucynta [Tapentadol] Hives    Social History  Substance Use Topics  . Smoking status: Former Smoker    Types: Cigarettes  . Smokeless tobacco: Never Used     Comment: occasional smoker  . Alcohol use No    History reviewed. No pertinent family history.  Hospital Course:  Admitted for the above issues.  She initially had a normal troponin I, which later bumped to 0.06, then back down to 0.04. Cardiology was consulted with the recommendation that the patient be evaluated by ECHO either as an in- or outpatient.  The assessment was that the troponin bump was likely due to demand ischemia.  Supportive care recommended.  The patient was able to ambulate while in the hospital without return of her symptoms.  On the day of discharge she noted +FM, no LOF, no vaginal bleeding and no contractions.  She was, therefore, deemed to be stable for discharge with close follow-up and outpatient arrangement for ECHO.  She did continue to have a headache at discharge, which is a normal headache for her.   Discharge Exam: BP 109/66 (BP Location: Left Arm)   Pulse 76   Temp 98.2 F (36.8 C) (Oral)   Resp 16   Ht 5\' 5"  ([id number] m)   Wt (!) 303 lb 4.8 oz (137.6 kg)   LMP 01/13/2016 (Within Days) Comment: pt is pregnant  SpO2 98%   BMI 50.47 kg/m  General  no apparent distress   CV  RRR   Pulmonary  clear to ausculatation bllaterally   Abdomen  Gravid, non-tender  Extremities  no edema, symmetric, SCDs in place    Condition at Discharge: Stable  Complications affecting treatment: None  Discharge Medications:  Allergies as of 09/24/2016      Reactions   Nucynta [tapentadol] Hives      Medication List    TAKE these medications   acetaminophen 325 MG tablet Commonly known as:  TYLENOL Take 650 mg by mouth every 6 (six) hours as needed.   aspirin EC 81  MG tablet Take 81 mg by mouth daily.   esomeprazole 40 MG capsule Commonly known as:  NEXIUM Take 40 mg by mouth daily at 12 noon.   HYDROcodone-acetaminophen 5-325 MG tablet Commonly known as:  NORCO/VICODIN Take 1 tablet by mouth every 6 (six) hours as needed for moderate pain.   multivitamin-prenatal 27-0.8 MG Tabs tablet Take 1 tablet by mouth daily at 12 noon.       Follow-up arrangements:  Follow-up Information    Follow up Follow up.   Contact information: You will be discharged directly to labor and delivery for further monitoring.       [place], [place] Follow up in 1 week(s).   Why:  routine OB appt/hospital follow up Contact information: [address] [place] [address] [phone]           Discharge Disposition: Home to self-care in stable condition with resolved chest pain.  Signed: Will Bonnet, MD 09/24/2016 12:21 PM

## 2016-10-04 ENCOUNTER — Inpatient Hospital Stay
Admission: EM | Admit: 2016-10-04 | Discharge: 2016-10-05 | Disposition: A | Discharge status: Home / Self Care | Payer: Medicaid Other | Attending: Certified Nurse Midwife | Admitting: Certified Nurse Midwife

## 2016-10-04 DIAGNOSIS — K219 Gastro-esophageal reflux disease without esophagitis: Secondary | ICD-10-CM | POA: Insufficient documentation

## 2016-10-04 DIAGNOSIS — Z3A34 34 weeks gestation of pregnancy: Secondary | ICD-10-CM | POA: Insufficient documentation

## 2016-10-04 DIAGNOSIS — O99613 Diseases of the digestive system complicating pregnancy, third trimester: Secondary | ICD-10-CM | POA: Insufficient documentation

## 2016-10-04 DIAGNOSIS — Z888 Allergy status to other drugs, medicaments and biological substances status: Secondary | ICD-10-CM | POA: Diagnosis not present

## 2016-10-04 DIAGNOSIS — O0943 Supervision of pregnancy with grand multiparity, third trimester: Secondary | ICD-10-CM | POA: Insufficient documentation

## 2016-10-04 DIAGNOSIS — O09523 Supervision of elderly multigravida, third trimester: Secondary | ICD-10-CM | POA: Diagnosis not present

## 2016-10-04 DIAGNOSIS — R51 Headache: Secondary | ICD-10-CM | POA: Diagnosis not present

## 2016-10-04 DIAGNOSIS — O99213 Obesity complicating pregnancy, third trimester: Secondary | ICD-10-CM | POA: Diagnosis not present

## 2016-10-04 DIAGNOSIS — O4703 False labor before 37 completed weeks of gestation, third trimester: Secondary | ICD-10-CM | POA: Diagnosis present

## 2016-10-04 DIAGNOSIS — Z87891 Personal history of nicotine dependence: Secondary | ICD-10-CM | POA: Diagnosis not present

## 2016-10-04 DIAGNOSIS — Z79899 Other long term (current) drug therapy: Secondary | ICD-10-CM | POA: Diagnosis not present

## 2016-10-04 DIAGNOSIS — O26893 Other specified pregnancy related conditions, third trimester: Secondary | ICD-10-CM | POA: Insufficient documentation

## 2016-10-04 DIAGNOSIS — Z833 Family history of diabetes mellitus: Secondary | ICD-10-CM | POA: Insufficient documentation

## 2016-10-04 DIAGNOSIS — O10913 Unspecified pre-existing hypertension complicating pregnancy, third trimester: Secondary | ICD-10-CM | POA: Insufficient documentation

## 2016-10-04 DIAGNOSIS — O34219 Maternal care for unspecified type scar from previous cesarean delivery: Secondary | ICD-10-CM | POA: Diagnosis not present

## 2016-10-04 LAB — URINALYSIS, COMPLETE (UACMP) WITH MICROSCOPIC
Bilirubin Urine: NEGATIVE
Glucose, UA: NEGATIVE mg/dL
Hgb urine dipstick: NEGATIVE
Ketones, ur: 5 mg/dL — AB
Nitrite: NEGATIVE
PH: 5 (ref 5.0–8.0)
Protein, ur: 30 mg/dL — AB
SPECIFIC GRAVITY, URINE: 1.032 — AB (ref 1.005–1.030)

## 2016-10-04 MED ORDER — NIFEDIPINE 10 MG PO CAPS
10.0000 mg | ORAL_CAPSULE | Freq: Once | ORAL | Status: AC
Start: 2016-10-04 — End: 2016-10-04
  Administered 2016-10-04: 10 mg via ORAL
  Filled 2016-10-04: qty 1

## 2016-10-04 MED ORDER — ACETAMINOPHEN 500 MG PO TABS
1000.0000 mg | ORAL_TABLET | Freq: Four times a day (QID) | ORAL | Status: DC | PRN
  Administered 2016-10-04 – 2016-10-05 (×2): 1000 mg via ORAL
  Filled 2016-10-04 (×2): qty 2

## 2016-10-04 NOTE — OB Triage Note (Addendum)
Patient came in for observation for labor evaluation. Patient reports contractions every four to five minutes that started at 2000. Patient rates pain 8 out of 10. Patient denies leaking of fluid, vaginal bleeding and spotting. Vital signs stable and patient afebrile. FHR baseline 135 with moderate variability with accelerations 15 x 15 and no decelerations. Significant other at bedside. Will continue to monitor.

## 2016-10-05 DIAGNOSIS — O4703 False labor before 37 completed weeks of gestation, third trimester: Secondary | ICD-10-CM | POA: Diagnosis not present

## 2016-10-05 MED ORDER — MORPHINE SULFATE (PF) 10 MG/ML IV SOLN
10.0000 mg | Freq: Once | INTRAVENOUS | Status: AC
  Administered 2016-10-05: 10 mg via INTRAMUSCULAR

## 2016-10-05 MED ORDER — MORPHINE SULFATE (PF) 10 MG/ML IV SOLN
INTRAVENOUS | Status: AC
  Administered 2016-10-05: 10 mg via INTRAMUSCULAR
  Filled 2016-10-05: qty 1

## 2016-10-05 MED ORDER — LACTATED RINGERS IV BOLUS (SEPSIS)
500.0000 mL | Freq: Once | INTRAVENOUS | Status: AC
  Administered 2016-10-05: 500 mL via INTRAVENOUS

## 2016-10-05 MED ORDER — LACTATED RINGERS IV SOLN
INTRAVENOUS | Status: DC
  Administered 2016-10-05: 04:00:00 via INTRAVENOUS

## 2016-10-05 NOTE — Discharge Instructions (Signed)
Discharge instructions given, patient stated understanding. Please return if period like bleeding, water breaks or contractions increasing with intensity every 3-5 minutes.   Please make a follow up appointment for an echocardiogram as soon as possible.

## 2016-10-05 NOTE — H&P (Signed)
Obstetrics & Gynecology H&P      History of Present Illness: Patient is a 41 y.o. [id number] at 34 week 1 day with pregnancy notable for history chronic hypertension (unmedicated), history of two prior cesarean sections, chronic headaches, morbid obesity, and tachycardia who presented with complaints of contractions and back pain since 2000 last night. Contractions were mild persistent and seemed to get closer after her cervix was checked. She was hydrated with po fluids and given a dose of nifedipine 10 mgm but the contractions persisted and became more painful. Now rrates contractions 9/10. Reports +FM, no LOF and no VB on arrival. Had some bloody mucous on glove after difficult exam. Developed a frontal  headache after presenting to L&D. Had not eaten supper last night. Desires a repeat CS for this delivery with BTL (needs to sign 30 day papers) About 1-2 weeks ago was evaluated in the ED with EKG showing sinus tachycardia, normal troponin, and negative CTA for PE.   Repeat troponin did show a slight bump although her chest symptoms had completely resolved, and she was observed overnight in ICU. She was referred for an echocardiogram.  Was not able to make her appt 2 days due to one of her child's illness and needs to reschedule. Review of Systems: Constitutional : no fever, positive for fatigue HEENT: positive for frontal headache, negative for nasal or sinus congestion, negative sore throat Respiratory: no SOB Digestive: mild nausea since taking nifedipine. No diarrhea. Last BM 1/22 Cardiac: denies palpitations. Having some left upper chest pain and numbness into left shoulder (intermittently since her previous admission) GU: see HPI   Past Medical History:  Past Medical History:  Diagnosis Date  . Advanced maternal age in multigravida   . Asthma   . Depression   . GERD (gastroesophageal reflux disease)   . [place] multiparity   . Headache   . Hypertension   . Leg fracture   . Obesity,  morbid, BMI 40.0-49.9 ([place])   . Post traumatic stress disorder   . Pre-eclampsia   . Tachycardia   . Wrist fracture, closed    bilateral    Past Surgical History:  Past Surgical History:  Procedure Laterality Date  . CESAREAN SECTION  2010  . CESAREAN SECTION  2011  . CHOLECYSTECTOMY    . KNEE SURGERY    . ROTATOR CUFF REPAIR    . TONSILLECTOMY      Family History:  Family History  Problem Relation Age of Onset  . Diabetes Sister   . Migraines Maternal Aunt   . Migraines Maternal Uncle   . Migraines Paternal Aunt   . Migraines Paternal Uncle   . Diabetes Sister   . Diabetes Sister   . Diabetes Sister   . Diabetes Sister   . Diabetes Sister   . Ovarian cancer Cousin     Social History:  Social History   Social History  . Marital status: Single    Spouse name: N/A  . Number of children: N/A  . Years of education: N/A   Occupational History  . Not on file.   Social History Main Topics  . Smoking status: Former Smoker    Types: Cigarettes  . Smokeless tobacco: Never Used     Comment: occasional smoker  . Alcohol use No  . Drug use: No     Comment: last used several months ago  . Sexual activity: Yes   Other Topics Concern  . Not on file   Social History Narrative  .  No narrative on file    Allergies:  Allergies  Allergen Reactions  . Nucynta [Tapentadol] Hives    Medications: Prior to Admission medications   Medication Sig Start Date End Date Taking? Authorizing Provider  acetaminophen (TYLENOL) 325 MG tablet Take 650 mg by mouth every 6 (six) hours as needed.   Yes Historical Provider, MD  esomeprazole (NEXIUM) 40 MG capsule Take 40 mg by mouth daily at 12 noon.   Yes Historical Provider, MD  HYDROcodone-acetaminophen (NORCO/VICODIN) 5-325 MG tablet Take 1 tablet by mouth every 6 (six) hours as needed for moderate pain.   Yes Historical Provider, MD  Prenatal Vit-Fe Fumarate-FA (MULTIVITAMIN-PRENATAL) 27-0.8 MG TABS tablet Take 1 tablet by  mouth daily at 12 noon.   Yes Historical Provider, MD  aspirin EC 81 MG tablet Take 81 mg by mouth daily.    Historical Provider, MD    Physical Exam BP 125/78   Pulse 100   LMP 01/13/2016 (Within Days) Comment: pt is pregnant  SpO2 98%   General: NAD HEENT: normal Pulmonary: no increased work of breathing Cardiovascular: RRR without murmur Abdomen: gravid, obese. BS active. Contractions palpate mild Contractions: q3-6 min apart FHR: 135 with accelerations to 150s, moderate variability, no decelerations Cervix: closed/ 25%/ posterior/ -2 to -3 Extremities: trace BLE edema Neurologic: grossly intact Psychiatric: mood appropriate  Labs: Results for orders placed or performed during the hospital encounter of 10/04/16 (from the past 72 hour(s))  Urinalysis, Complete w Microscopic     Status: Abnormal   Collection Time: 10/04/16 10:46 PM  Result Value Ref Range   Color, Urine YELLOW (A) YELLOW   APPearance CLEAR (A) CLEAR   Specific Gravity, Urine 1.032 (H) 1.005 - 1.030   pH 5.0 5.0 - 8.0   Glucose, UA NEGATIVE NEGATIVE mg/dL   Hgb urine dipstick NEGATIVE NEGATIVE   Bilirubin Urine NEGATIVE NEGATIVE   Ketones, ur 5 (A) NEGATIVE mg/dL   Protein, ur 30 (A) NEGATIVE mg/dL   Nitrite NEGATIVE NEGATIVE   Leukocytes, UA SMALL (A) NEGATIVE   RBC / HPF 0-5 0 - 5 RBC/hpf   WBC, UA 0-5 0 - 5 WBC/hpf   Bacteria, UA RARE (A) NONE SEEN   Squamous Epithelial / LPF 0-5 (A) NONE SEEN   Mucous PRESENT       Assessment: IUP at 34 wk 1 d with threatened preterm labor  Plan: 1. IV hydration and morphine for therapeutic rest  2) Fetus - reactive category I tracing -

## 2016-10-05 NOTE — Discharge Summary (Signed)
Physician Discharge Summary  Patient ID: Lauren [name] MRN: [id number] DOB/AGE: 01-26-1976 41 y.o.  Admit date: 10/04/2016 Discharge date: 10/05/2016  Admission Diagnoses: Threatened preterm labor at 39wk pregnancy  Discharge Diagnoses:  Threatened preterm labor-resolved  Discharged Condition: stable  Hospital Course: 41 yo G11 [phone] with EDC=11/15/2016 presented at [redacted] week gestation with complaints of painful contractions since 2000 last night. Her pregnancy was notable for history of CHTN (not on medication), two prior Cesarean sections, chronic headaches, morbid obesity(weight 300#), and tachycardia. Her cervix was closed on arrival, but due to persistence of painful frequent contractions was first treated with nifedipine 10 mgm po and oral hydration (specific gravity was 1.032) then IV hydration and morphine 10 mgm IM. Patient was able to rest after the morphine and contractions spaced out. She was essentially acontractile over the last hour before discharge. FHR remained reactive (CAt1) with baseline 130-135 and accelerations to 150s with moderate variability. Vital signs also remained WNL with pulse between 80s to 103 BPM. Patient had a frontal headache that waxed and waned. She was able to tolerate a regular diet for breakfast. Had some left chest pain and left shoulder pain ( present for over 2 weeks) and was advised to reschedule echocardiogram with her cardiologist Dr Marcelline Deist (canceled her appt on 1/22).   Consults: none  Significant Diagnostic Studies: none  Treatments: see hospital course  Discharge Exam: Blood pressure (!) 112/50, pulse 88, temperature 98.1 F (36.7 C), temperature source Oral, last menstrual period 01/13/2016, SpO2 99 %. See hospital course  Disposition: 01-Home or Self Care  Discharge Instructions    Discharge patient    Complete by:  As directed    Discharge disposition:  01-Home or Self Care   Discharge patient date:  10/05/2016     Allergies as of  10/05/2016      Reactions   Nucynta [tapentadol] Hives      Medication List    TAKE these medications   acetaminophen 325 MG tablet Commonly known as:  TYLENOL Take 650 mg by mouth every 6 (six) hours as needed.   aspirin EC 81 MG tablet Take 81 mg by mouth daily.   esomeprazole 40 MG capsule Commonly known as:  NEXIUM Take 40 mg by mouth daily at 12 noon.   HYDROcodone-acetaminophen 5-325 MG tablet Commonly known as:  NORCO/VICODIN Take 1 tablet by mouth every 6 (six) hours as needed for moderate pain.   multivitamin-prenatal 27-0.8 MG Tabs tablet Take 1 tablet by mouth daily at 12 noon.      Follow up appt at [facility] 25 January  Signed: Dalia Heading 10/05/2016, 7:39 AM

## 2016-10-10 ENCOUNTER — Observation Stay
Admission: EM | Admit: 2016-10-10 | Discharge: 2016-10-11 | Disposition: A | Discharge status: Home / Self Care | Payer: Medicaid Other | Attending: Advanced Practice Midwife | Admitting: Advanced Practice Midwife

## 2016-10-10 DIAGNOSIS — F32A Depression, unspecified: Secondary | ICD-10-CM

## 2016-10-10 DIAGNOSIS — O10013 Pre-existing essential hypertension complicating pregnancy, third trimester: Secondary | ICD-10-CM | POA: Diagnosis present

## 2016-10-10 DIAGNOSIS — O10913 Unspecified pre-existing hypertension complicating pregnancy, third trimester: Principal | ICD-10-CM | POA: Insufficient documentation

## 2016-10-10 DIAGNOSIS — O26893 Other specified pregnancy related conditions, third trimester: Secondary | ICD-10-CM | POA: Insufficient documentation

## 2016-10-10 DIAGNOSIS — Z7982 Long term (current) use of aspirin: Secondary | ICD-10-CM | POA: Insufficient documentation

## 2016-10-10 DIAGNOSIS — F329 Major depressive disorder, single episode, unspecified: Secondary | ICD-10-CM

## 2016-10-10 DIAGNOSIS — O99213 Obesity complicating pregnancy, third trimester: Secondary | ICD-10-CM | POA: Insufficient documentation

## 2016-10-10 DIAGNOSIS — Z3A34 34 weeks gestation of pregnancy: Secondary | ICD-10-CM | POA: Diagnosis not present

## 2016-10-10 DIAGNOSIS — Z87891 Personal history of nicotine dependence: Secondary | ICD-10-CM | POA: Insufficient documentation

## 2016-10-10 DIAGNOSIS — R51 Headache: Secondary | ICD-10-CM | POA: Insufficient documentation

## 2016-10-10 DIAGNOSIS — O99342 Other mental disorders complicating pregnancy, second trimester: Secondary | ICD-10-CM

## 2016-10-10 DIAGNOSIS — O162 Unspecified maternal hypertension, second trimester: Secondary | ICD-10-CM

## 2016-10-10 DIAGNOSIS — O163 Unspecified maternal hypertension, third trimester: Secondary | ICD-10-CM | POA: Diagnosis present

## 2016-10-10 LAB — COMPREHENSIVE METABOLIC PANEL
ALBUMIN: 2.6 g/dL — AB (ref 3.5–5.0)
ALT: 47 U/L (ref 14–54)
ANION GAP: 6 (ref 5–15)
AST: 39 U/L (ref 15–41)
Alkaline Phosphatase: 99 U/L (ref 38–126)
BILIRUBIN TOTAL: 0.1 mg/dL — AB (ref 0.3–1.2)
BUN: 10 mg/dL (ref 6–20)
CO2: 20 mmol/L — ABNORMAL LOW (ref 22–32)
Calcium: 8.4 mg/dL — ABNORMAL LOW (ref 8.9–10.3)
Chloride: 108 mmol/L (ref 101–111)
Creatinine, Ser: 0.63 mg/dL (ref 0.44–1.00)
GFR calc Af Amer: >60 mL/min (ref >60)
GFR calc non Af Amer: >60 mL/min (ref >60)
Glucose, Bld: 91 mg/dL (ref 65–99)
POTASSIUM: 3.4 mmol/L — AB (ref 3.5–5.1)
Sodium: 134 mmol/L — ABNORMAL LOW (ref 135–145)
TOTAL PROTEIN: 6.5 g/dL (ref 6.5–8.1)

## 2016-10-10 LAB — CBC
HCT: 31.1 % — ABNORMAL LOW (ref 35.0–47.0)
HEMOGLOBIN: 10.2 g/dL — AB (ref 12.0–16.0)
MCH: 26.7 pg (ref 26.0–34.0)
MCHC: 32.9 g/dL (ref 32.0–36.0)
MCV: 81.2 fL (ref 80.0–100.0)
Platelets: 279 10*3/uL (ref 150–440)
RBC: 3.83 MIL/uL (ref 3.80–5.20)
RDW: 15 % — ABNORMAL HIGH (ref 11.5–14.5)
WBC: 10.2 10*3/uL (ref 3.6–11.0)

## 2016-10-10 LAB — PROTEIN / CREATININE RATIO, URINE
Creatinine, Urine: 258 mg/dL
PROTEIN CREATININE RATIO: 0.1 mg/mg{creat} (ref 0.00–0.15)
TOTAL PROTEIN, URINE: 27 mg/dL

## 2016-10-10 MED ORDER — BETAMETHASONE SOD PHOS & ACET 6 (3-3) MG/ML IJ SUSP
INTRAMUSCULAR | Status: AC
Start: 2016-10-10 — End: 2016-10-10
  Administered 2016-10-10: 12 mg via INTRAMUSCULAR
  Filled 2016-10-10: qty 1

## 2016-10-10 MED ORDER — ZOLPIDEM TARTRATE 5 MG PO TABS
10.0000 mg | ORAL_TABLET | Freq: Every evening | ORAL | Status: DC | PRN
  Administered 2016-10-10: 10 mg via ORAL
  Filled 2016-10-10: qty 2

## 2016-10-10 MED ORDER — LACTATED RINGERS IV SOLN
INTRAVENOUS | Status: DC
  Administered 2016-10-10 – 2016-10-11 (×2): via INTRAVENOUS

## 2016-10-10 MED ORDER — BUTALBITAL-APAP-CAFFEINE 50-325-40 MG PO TABS
2.0000 | ORAL_TABLET | ORAL | Status: AC
  Administered 2016-10-10: 2 via ORAL
  Filled 2016-10-10: qty 2

## 2016-10-10 MED ORDER — BETAMETHASONE SOD PHOS & ACET 6 (3-3) MG/ML IJ SUSP
12.0000 mg | INTRAMUSCULAR | Status: DC
  Administered 2016-10-10: 12 mg via INTRAMUSCULAR

## 2016-10-10 NOTE — H&P (Signed)
Obstetrics & Gynecology H&P      History of Present Illness: Patient is a 41 y.o. [id number] at 34 week 6 day with pregnancy notable for history chronic hypertension (unmedicated), history of two prior cesarean sections, chronic headaches, morbid obesity, and tachycardia who presented from office with complaints of frontal and occipital headaches and black spots in vision (since yesterday) Last ate lunch today at 1PM. Baby active. No vaginal bleeding. Having some irregular contractions. ALso began to complain of left sided chest and shoulder pain after she had been here for a couple hours. Desires a repeat CS for this delivery with BTL (has not signed 30 day papers) About 2 weeks ago was evaluated in the ED for chest pain and left shoulder pain with EKG showing sinus tachycardia, normal troponin, and negative CTA for PE.   Repeat troponin did show a slight bump although her chest symptoms had completely resolved, and she was observed overnight in ICU. She was referred for an echocardiogram.  Was not able to make her appt 2 days due to one of her child's illness and has not rescheduled. Review of Systems: Constitutional : no fever, positive for fatigue HEENT: positive for frontal and occipital headache , negative for nasal or sinus congestion, negative sore throat Respiratory: no SOB Digestive: No nausea/vomiting. No diarrhea. Cardiac: denies palpitations. Having some left upper chest pain and numbness into left shoulder (intermittently since her previous admission) GU: see HPI Musculoskeletal: pain in left buttock extending down leg, left lower back pain Past Medical History:  Past Medical History:  Diagnosis Date  . Advanced maternal age in multigravida   . Asthma   . Depression   . GERD (gastroesophageal reflux disease)   . [place] multiparity   . Headache   . Hypertension   . Leg fracture   . Obesity, morbid, BMI 40.0-49.9 ([place])   . Post traumatic stress disorder   . Pre-eclampsia   .  Tachycardia   . Wrist fracture, closed    bilateral    Past Surgical History:  Past Surgical History:  Procedure Laterality Date  . CESAREAN SECTION  2010  . CESAREAN SECTION  2011  . CHOLECYSTECTOMY    . KNEE SURGERY    . ROTATOR CUFF REPAIR    . TONSILLECTOMY      Family History:  Family History  Problem Relation Age of Onset  . Diabetes Sister   . Migraines Maternal Aunt   . Migraines Maternal Uncle   . Migraines Paternal Aunt   . Migraines Paternal Uncle   . Diabetes Sister   . Diabetes Sister   . Diabetes Sister   . Diabetes Sister   . Diabetes Sister   . Ovarian cancer Cousin     Social History:  Single Former smoker  Allergies:  Allergies  Allergen Reactions  . Nucynta [Tapentadol] Hives    Medications: Prior to Admission medications   Medication Sig Start Date End Date Taking? Authorizing Provider  acetaminophen (TYLENOL) 325 MG tablet Take 650 mg by mouth every 6 (six) hours as needed.   Yes Historical Provider, MD  esomeprazole (NEXIUM) 40 MG capsule Take 40 mg by mouth daily at 12 noon.   Yes Historical Provider, MD  HYDROcodone-acetaminophen (NORCO/VICODIN) 5-325 MG tablet Take 1 tablet by mouth every 6 (six) hours as needed for moderate pain.   Yes Historical Provider, MD  Prenatal Vit-Fe Fumarate-FA (MULTIVITAMIN-PRENATAL) 27-0.8 MG TABS tablet Take 1 tablet by mouth daily at 12 noon.   Yes Historical Provider, MD  aspirin EC 81 MG tablet Take 81 mg by mouth daily.    Historical Provider, MD    Physical Exam:  Patient Vitals for the past 24 hrs:  BP Temp Temp src Pulse Resp  10/10/16 1925 (!) 127/51 - - 83 -  10/10/16 1920 (!) 127/51 98.6 F (37 C) Oral 93 16  10/10/16 1908 (!) 165/101 - - 87 -  10/10/16 1851 (!) 156/97 - - 87 -  10/10/16 1824 120/70 - - 95 -  10/10/16 1809 (!) 158/95 - - 89 -  10/10/16 1753 (!) 155/96 - - 97 -  10/10/16 1738 (!) 149/97 - - 96 -  10/10/16 1723 (!) 153/94 - - 99 -  10/10/16 1708 (!) 148/87 - - 98 -   10/10/16 1651 (!) 147/85 - - 93 -  10/10/16 1645 (!) 150/94 98.4 F (36.9 C) Oral 99 -   General: NAD, looking at phone HEENT: normal Pulmonary: no increased work of breathing/ CTA bilaterally Cardiovascular: RRR without murmur Abdomen: gravid, morbidly obese. BS active. Contractions palpate mild Contractions: irregular mild FHR: 135 baseline with accelerations to 150s to 160s, moderate variability. One decel?to 60s with a contraction Cervix:  Extremities: trace BLE edema Neurologic: grossly intact; DTRs+1 Psychiatric: mood appropriate  Labs: Results for orders placed or performed during the hospital encounter of 10/10/16 (from the past 72 hour(s))  CBC     Status: Abnormal   Collection Time: 10/10/16  5:31 PM  Result Value Ref Range   WBC 10.2 3.6 - 11.0 K/uL   RBC 3.83 3.80 - 5.20 MIL/uL   Hemoglobin 10.2 (L) 12.0 - 16.0 g/dL   HCT 31.1 (L) 35.0 - 47.0 %   MCV 81.2 80.0 - 100.0 fL   MCH 26.7 26.0 - 34.0 pg   MCHC 32.9 32.0 - 36.0 g/dL   RDW 15.0 (H) 11.5 - 14.5 %   Platelets 279 150 - 440 K/uL  Protein / creatinine ratio, urine     Status: None   Collection Time: 10/10/16  5:31 PM  Result Value Ref Range   Creatinine, Urine 258 mg/dL   Total Protein, Urine 27 mg/dL    Comment: NO NORMAL RANGE ESTABLISHED FOR THIS TEST   Protein Creatinine Ratio 0.10 0.00 - 0.15 mg/mg[Cre]  Comprehensive metabolic panel     Status: Abnormal   Collection Time: 10/10/16  5:31 PM  Result Value Ref Range   Sodium 134 (L) 135 - 145 mmol/L   Potassium 3.4 (L) 3.5 - 5.1 mmol/L   Chloride 108 101 - 111 mmol/L   CO2 20 (L) 22 - 32 mmol/L   Glucose, Bld 91 65 - 99 mg/dL   BUN 10 6 - 20 mg/dL   Creatinine, Ser 0.63 0.44 - 1.00 mg/dL   Calcium 8.4 (L) 8.9 - 10.3 mg/dL   Total Protein 6.5 6.5 - 8.1 g/dL   Albumin 2.6 (L) 3.5 - 5.0 g/dL   AST 39 15 - 41 U/L   ALT 47 14 - 54 U/L   Alkaline Phosphatase 99 38 - 126 U/L   Total Bilirubin 0.1 (L) 0.3 - 1.2 mg/dL   GFR calc non Af Amer >60 >60  mL/min   GFR calc Af Amer >60 >60 mL/min    Comment: (NOTE) The eGFR has been calculated using the CKD EPI equation. This calculation has not been validated in all clinical situations. eGFR's persistently <60 mL/min signify possible Chronic Kidney Disease.    Anion gap 6 5 - 15  Assessment: IUP at 34.6 weeks with elevated blood pressures, hx of chronic hypertension and normal labs. ?worsening of chronic hypertension vs evolving superimposed preeclampsia  Plan: 1.Admit to L&D for observation over night. Discussed plan of management with Dr Kenton Kingfisher. VS q1 hour Given Fioricet for headache pain Can have diet if blood pressures are not in the severe range. If blood pressures in severe range will make NPO and contact Dr Kenton Kingfisher. BMZ in case need to deliver preterm 2) Fetus - reactive category I tracing  Lief Palmatier, CNM -

## 2016-10-10 NOTE — OB Triage Note (Signed)
Pt arrived to OBS rm 1 from ED with c/o HA, HTN, Spots in vision, and back pain. Having a few contractions, no leaking of fluid, or bleeding. Pt stated was at office and was sent over to be evaluated. Pt placed on monitors and oriented to room.

## 2016-10-11 ENCOUNTER — Inpatient Hospital Stay
Admission: RE | Admit: 2016-10-11 | Discharge: 2016-10-11 | Disposition: A | Discharge status: Home / Self Care | Payer: Medicaid Other | Attending: Obstetrics and Gynecology | Admitting: Obstetrics and Gynecology

## 2016-10-11 DIAGNOSIS — Z3A35 35 weeks gestation of pregnancy: Secondary | ICD-10-CM | POA: Insufficient documentation

## 2016-10-11 DIAGNOSIS — O10913 Unspecified pre-existing hypertension complicating pregnancy, third trimester: Secondary | ICD-10-CM | POA: Insufficient documentation

## 2016-10-11 DIAGNOSIS — O99213 Obesity complicating pregnancy, third trimester: Secondary | ICD-10-CM | POA: Insufficient documentation

## 2016-10-11 LAB — URINE DRUG SCREEN, QUALITATIVE (ARMC ONLY)
AMPHETAMINES, UR SCREEN: NOT DETECTED
Barbiturates, Ur Screen: NOT DETECTED
Benzodiazepine, Ur Scrn: NOT DETECTED
Cannabinoid 50 Ng, Ur ~~LOC~~: NOT DETECTED
Cocaine Metabolite,Ur ~~LOC~~: NOT DETECTED
MDMA (ECSTASY) UR SCREEN: NOT DETECTED
METHADONE SCREEN, URINE: NOT DETECTED
Opiate, Ur Screen: NOT DETECTED
Phencyclidine (PCP) Ur S: NOT DETECTED
Tricyclic, Ur Screen: NOT DETECTED

## 2016-10-11 LAB — TROPONIN I

## 2016-10-11 MED ORDER — BUTALBITAL-APAP-CAFFEINE 50-325-40 MG PO TABS
ORAL_TABLET | ORAL | Status: AC
  Administered 2016-10-11: 2 via ORAL
  Filled 2016-10-11: qty 2

## 2016-10-11 MED ORDER — BETAMETHASONE SOD PHOS & ACET 6 (3-3) MG/ML IJ SUSP
INTRAMUSCULAR | Status: AC
  Administered 2016-10-11: 12 mg
  Filled 2016-10-11: qty 1

## 2016-10-11 MED ORDER — POTASSIUM CHLORIDE CRYS ER 20 MEQ PO TBCR
40.0000 meq | EXTENDED_RELEASE_TABLET | Freq: Once | ORAL | Status: AC
  Administered 2016-10-11: 40 meq via ORAL

## 2016-10-11 MED ORDER — BUTALBITAL-APAP-CAFFEINE 50-325-40 MG PO TABS
2.0000 | ORAL_TABLET | ORAL | Status: AC
  Administered 2016-10-11: 2 via ORAL

## 2016-10-11 MED ORDER — POTASSIUM CHLORIDE CRYS ER 20 MEQ PO TBCR
EXTENDED_RELEASE_TABLET | ORAL | Status: AC
  Administered 2016-10-11: 40 meq via ORAL
  Filled 2016-10-11: qty 2

## 2016-10-11 MED ORDER — BETAMETHASONE SOD PHOS & ACET 6 (3-3) MG/ML IJ SUSP: 12.0000 mg | Freq: Once | INTRAMUSCULAR | Status: DC

## 2016-10-11 NOTE — Discharge Summary (Signed)
Physician Final Progress Note  Patient ID: Lauren [name] MRN: [id number] DOB/AGE: [birth year] 39 y.o.  Admit date: 10/10/2016 Admitting provider: Farrel Conners, CNM Discharge date: 10/11/2016   Admission Diagnoses: Pt was sent to [facility] from the office for [facility] work up due to mildly elevated BP, headache, visual changes, 1+proteinuria and hx of preeclampsia. Pt also has morbid obesity. Pt denied contractions, LOF, VB. She admitted positive fetal movement. Pt has chronic hypertension and chronic headaches and hx of 2 cesarean sections. She has also recently been evaluated for chest and left shoulder pain with normal results. EKG showed sinus tachycardia, normal troponin and negative CTA for PE. An outpatient echocardiogram had been ordered but she did not keep her appointment.   Discharge Diagnoses:  Active Problems:   Elevated blood pressure affecting pregnancy in third trimester, antepartum IUP at [redacted]w[redacted]d, G11 [id number], Chronic hypertension with normal labs, chronic headaches, morbid obesity, reactive NST  History of Present Illness: The patient was admitted for observation, placed on monitors, labs drawn.   Past Medical History:  Diagnosis Date  . Advanced maternal age in multigravida   . Asthma   . Depression   . GERD (gastroesophageal reflux disease)   . [place] multiparity   . Headache   . Hypertension   . Leg fracture   . Obesity, morbid, BMI 40.0-49.9 ([facility])   . Post traumatic stress disorder   . Pre-eclampsia   . Tachycardia   . Wrist fracture, closed    bilateral    Past Surgical History:  Procedure Laterality Date  . CESAREAN SECTION  2010  . CESAREAN SECTION  2011  . CHOLECYSTECTOMY    . KNEE SURGERY    . ROTATOR CUFF REPAIR    . TONSILLECTOMY      No current facility-administered medications on file prior to encounter.    Current Outpatient Prescriptions on File Prior to Encounter  Medication Sig Dispense Refill  . acetaminophen (TYLENOL) 325 MG tablet Take 650  mg by mouth every 6 (six) hours as needed.    Marland Kitchen aspirin EC 81 MG tablet Take 81 mg by mouth daily.    Marland Kitchen esomeprazole (NEXIUM) 40 MG capsule Take 40 mg by mouth daily at 12 noon.    . Prenatal Vit-Fe Fumarate-FA (MULTIVITAMIN-PRENATAL) 27-0.8 MG TABS tablet Take 1 tablet by mouth daily at 12 noon.    Marland Kitchen HYDROcodone-acetaminophen (NORCO/VICODIN) 5-325 MG tablet Take 1 tablet by mouth every 6 (six) hours as needed for moderate pain.      Allergies  Allergen Reactions  . Nucynta [Tapentadol] Hives    Social History   Social History  . Marital status: Single    Spouse name: N/A  . Number of children: N/A  . Years of education: N/A   Occupational History  . Not on file.   Social History Main Topics  . Smoking status: Former Smoker    Types: Cigarettes  . Smokeless tobacco: Never Used     Comment: occasional smoker  . Alcohol use No  . Drug use: No     Comment: last used several months ago  . Sexual activity: Yes   Other Topics Concern  . Not on file   Social History Narrative  . No narrative on file    Physical Exam: BP 123/70 (BP Location: Left Arm)   Pulse 86   Temp 98 F (36.7 C) (Oral)   Resp 16   LMP 01/13/2016 (Within Days) Comment: pt is pregnant  Gen: NAD CV: RRR Pulm: CTAB Pelvic:  deferred Toco: negative Fetal Well Being: 125 bpm, moderate variability, +accelerations, -decelerations Category I Ext: no evidence of DVT  Consults: cardiology suggested rescheduling outpatient echocardiogram due to concern for hypertensive cardiomyopathy. Per Dr Georgianne Fick pt is ok without echo due to reassuring and normal findings.   Significant Findings/ Diagnostic Studies: labs:   Results for Lauren, [name] (MRN [id number]) as of 10/11/2016 09:44  Ref. Range 10/10/2016 17:31 10/10/2016 23:47 10/11/2016 01:36 10/11/2016 08:33  COMPREHENSIVE METABOLIC PANEL Unknown Rpt (A)     Sodium Latest Ref Range: 135 - 145 mmol/L 134 (L)     Potassium Latest Ref Range: 3.5 - 5.1 mmol/L 3.4  (L)     Chloride Latest Ref Range: 101 - 111 mmol/L 108     CO2 Latest Ref Range: 22 - 32 mmol/L 20 (L)     Glucose Latest Ref Range: 65 - 99 mg/dL 91     BUN Latest Ref Range: 6 - 20 mg/dL 10     Creatinine Latest Ref Range: 0.44 - 1.00 mg/dL 0.63     Calcium Latest Ref Range: 8.9 - 10.3 mg/dL 8.4 (L)     Anion gap Latest Ref Range: 5 - 15  6     Alkaline Phosphatase Latest Ref Range: 38 - 126 U/L 99     Albumin Latest Ref Range: 3.5 - 5.0 g/dL 2.6 (L)     AST Latest Ref Range: 15 - 41 U/L 39     ALT Latest Ref Range: 14 - 54 U/L 47     Total Protein Latest Ref Range: 6.5 - 8.1 g/dL 6.5     Total Bilirubin Latest Ref Range: 0.3 - 1.2 mg/dL 0.1 (L)     EGFR (African American) Latest Ref Range: >60 mL/min >60     EGFR (Non-African Amer.) Latest Ref Range: >60 mL/min >60     Troponin I Latest Ref Range: <0.03 ng/mL   <0.03 <0.03  WBC Latest Ref Range: 3.6 - 11.0 K/uL 10.2     RBC Latest Ref Range: 3.80 - 5.20 MIL/uL 3.83     Hemoglobin Latest Ref Range: 12.0 - 16.0 g/dL 10.2 (L)     HCT Latest Ref Range: 35.0 - 47.0 % 31.1 (L)     MCV Latest Ref Range: 80.0 - 100.0 fL 81.2     MCH Latest Ref Range: 26.0 - 34.0 pg 26.7     MCHC Latest Ref Range: 32.0 - 36.0 g/dL 32.9     RDW Latest Ref Range: 11.5 - 14.5 % 15.0 (H)     Platelets Latest Ref Range: 150 - 440 K/uL 279     Total Protein, Urine Latest Units: mg/dL 27     Protein Creatinine Ratio Latest Ref Range: 0.00 - 0.15 mg/mgCre 0.10     Creatinine, Urine Latest Units: mg/dL 258     Amphetamines, Ur Screen Latest Ref Range: NONE DETECTED  NONE DETECTED     Barbiturates, Ur Screen Latest Ref Range: NONE DETECTED  NONE DETECTED     Benzodiazepine, Ur Scrn Latest Ref Range: NONE DETECTED  NONE DETECTED     Cocaine Metabolite,Ur [place] Latest Ref Range: NONE DETECTED  NONE DETECTED     Methadone Scn, Ur Latest Ref Range: NONE DETECTED  NONE DETECTED     MDMA (Ecstasy)Ur Screen Latest Ref Range: NONE DETECTED  NONE DETECTED     Cannabinoid 50  Ng, Ur [place] Latest Ref Range: NONE DETECTED  NONE DETECTED     Opiate, Ur Screen Latest  Ref Range: NONE DETECTED  NONE DETECTED     Phencyclidine (PCP) Ur S Latest Ref Range: NONE DETECTED  NONE DETECTED     Tricyclic, Ur Screen Latest Ref Range: NONE DETECTED  NONE DETECTED     EKG 12-LEAD Unknown  Rpt      Procedures: NST  Discharge Condition: good  Disposition: 01-Home or Self Care  Diet: Regular diet  Discharge Activity: Activity as tolerated  Discharge Instructions    Discharge activity:  No Restrictions    Complete by:  As directed    Discharge diet:  No restrictions    Complete by:  As directed    Fetal Kick Count:  Lie on our left side for one hour after a meal, and count the number of times your baby kicks.  If it is less than 5 times, get up, move around and drink some juice.  Repeat the test 30 minutes later.  If it is still less than 5 kicks in an hour, notify your doctor.    Complete by:  As directed    No sexual activity restrictions    Complete by:  As directed    Notify physician for a general feeling that "something is not right"    Complete by:  As directed    Notify physician for increase or change in vaginal discharge    Complete by:  As directed    Notify physician for intestinal cramps, with or without diarrhea, sometimes described as "gas pain"    Complete by:  As directed    Notify physician for leaking of fluid    Complete by:  As directed    Notify physician for low, dull backache, unrelieved by heat or Tylenol    Complete by:  As directed    Notify physician for menstrual like cramps    Complete by:  As directed    Notify physician for pelvic pressure    Complete by:  As directed    Notify physician for uterine contractions.  These may be painless and feel like the uterus is tightening or the baby is  "balling up"    Complete by:  As directed    Notify physician for vaginal bleeding    Complete by:  As directed    PRETERM LABOR:  Includes any of  the follwing symptoms that occur between 20 - [redacted] weeks gestation.  If these symptoms are not stopped, preterm labor can result in preterm delivery, placing your baby at risk    Complete by:  As directed      Allergies as of 10/11/2016      Reactions   Nucynta [tapentadol] Hives      Medication List    STOP taking these medications   HYDROcodone-acetaminophen 5-325 MG tablet Commonly known as:  NORCO/VICODIN     TAKE these medications   acetaminophen 325 MG tablet Commonly known as:  TYLENOL Take 650 mg by mouth every 6 (six) hours as needed.   aspirin EC 81 MG tablet Take 81 mg by mouth daily.   esomeprazole 40 MG capsule Commonly known as:  NEXIUM Take 40 mg by mouth daily at 12 noon.   multivitamin-prenatal 27-0.8 MG Tabs tablet Take 1 tablet by mouth daily at 12 noon.   Return to [facility] at 6pm on 10/11/16 for second dose of Betamethasone   Follow-up Information    [facility] Follow up.   Why:  go to regular scheduled prenatal visit Contact information: [place]  [place] [phone]          Total time spent taking care of this patient: 25 minutes  Signed: Rod Can, CNM  10/11/2016, 9:27 AM

## 2016-10-11 NOTE — Consult Note (Signed)
Reason for Consult: Chest pain Referring Physician: Dalia Heading  Lauren [name] is an 41 y.o. female.  HPI: The patient is G11P8 with past medical history significant for hypertension, acid reflux and asthma was admitted to the labor and delivery observation unit for chest pain. The patient states that her pain began more or less at rest. She denies any strenuous activity including heavy lifting. She denies shortness of breath, nausea, vomiting or diaphoresis. However her pain is substernal, dull and pressure-like in character. The pain radiates to her left arm and the patient reports that her left hand feels numb at times. She denies swelling in her upper extremities but admits to edema of her ankles and shins. The patient has had chest pain before. On a prior occasion it was associated with SVT which resolved spontaneously. On another admission the patient had chest pain associated with a small elevation in troponin that was determined to be secondary to hyperdynamic state and hypertension associated with pregnancy. During pregnancy the patient has not been on any antihypertensive medications. Today's encounter was not associated with arrhythmia. Workup for preeclampsia is negative and the patient's first troponin is also negative. She has had no EKG changes that she continues to have chest pain for which the hospitalist service was consulted for further guidance.  Past Medical History:  Diagnosis Date  . Advanced maternal age in multigravida   . Asthma   . Depression   . GERD (gastroesophageal reflux disease)   . [place] multiparity   . Headache   . Hypertension   . Leg fracture   . Obesity, morbid, BMI 40.0-49.9 ([place])   . Post traumatic stress disorder   . Pre-eclampsia   . Tachycardia   . Wrist fracture, closed    bilateral    Past Surgical History:  Procedure Laterality Date  . CESAREAN SECTION  2010  . CESAREAN SECTION  2011  . CHOLECYSTECTOMY    . KNEE SURGERY    . ROTATOR  CUFF REPAIR    . TONSILLECTOMY      Family History  Problem Relation Age of Onset  . Diabetes Sister   . Migraines Maternal Aunt   . Migraines Maternal Uncle   . Migraines Paternal Aunt   . Migraines Paternal Uncle   . Diabetes Sister   . Diabetes Sister   . Diabetes Sister   . Diabetes Sister   . Diabetes Sister   . Ovarian cancer Cousin     Social History:  reports that she has quit smoking. Her smoking use included Cigarettes. She has never used smokeless tobacco. She reports that she does not drink alcohol or use drugs.  Allergies:  Allergies  Allergen Reactions  . Nucynta [Tapentadol] Hives    Medications:  I have reviewed the patient's current medications. Prior to Admission:  Prescriptions Prior to Admission  Medication Sig Dispense Refill Last Dose  . acetaminophen (TYLENOL) 325 MG tablet Take 650 mg by mouth every 6 (six) hours as needed.   10/10/2016 at Unknown time  . aspirin EC 81 MG tablet Take 81 mg by mouth daily.   10/09/2016 at Unknown time  . esomeprazole (NEXIUM) 40 MG capsule Take 40 mg by mouth daily at 12 noon.   Past Month at Unknown time  . Prenatal Vit-Fe Fumarate-FA (MULTIVITAMIN-PRENATAL) 27-0.8 MG TABS tablet Take 1 tablet by mouth daily at 12 noon.   Past Week at Unknown time  . HYDROcodone-acetaminophen (NORCO/VICODIN) 5-325 MG tablet Take 1 tablet by mouth every 6 (six)  hours as needed for moderate pain.   Not Taking at Unknown time   Scheduled: . betamethasone acetate-betamethasone sodium phosphate  12 mg Intramuscular Q24 Hr x 2   [id number]  Results for orders placed or performed during the hospital encounter of 10/10/16 (from the past 48 hour(s))  CBC     Status: Abnormal   Collection Time: 10/10/16  5:31 PM  Result Value Ref Range   WBC 10.2 3.6 - 11.0 K/uL   RBC 3.83 3.80 - 5.20 MIL/uL   Hemoglobin 10.2 (L) 12.0 - 16.0 g/dL   HCT 85.0 (L) 93.6 - 64.8 %   MCV 81.2 80.0 - 100.0 fL   MCH 26.7 26.0 - 34.0 pg   MCHC 32.9 32.0 - 36.0  g/dL   RDW 22.4 (H) 01.1 - 04.6 %   Platelets 279 150 - 440 K/uL  Protein / creatinine ratio, urine     Status: None   Collection Time: 10/10/16  5:31 PM  Result Value Ref Range   Creatinine, Urine 258 mg/dL   Total Protein, Urine 27 mg/dL    Comment: NO NORMAL RANGE ESTABLISHED FOR THIS TEST   Protein Creatinine Ratio 0.10 0.00 - 0.15 mg/mg[Cre]  Comprehensive metabolic panel     Status: Abnormal   Collection Time: 10/10/16  5:31 PM  Result Value Ref Range   Sodium 134 (L) 135 - 145 mmol/L   Potassium 3.4 (L) 3.5 - 5.1 mmol/L   Chloride 108 101 - 111 mmol/L   CO2 20 (L) 22 - 32 mmol/L   Glucose, Bld 91 65 - 99 mg/dL   BUN 10 6 - 20 mg/dL   Creatinine, Ser 5.32 0.44 - 1.00 mg/dL   Calcium 8.4 (L) 8.9 - 10.3 mg/dL   Total Protein 6.5 6.5 - 8.1 g/dL   Albumin 2.6 (L) 3.5 - 5.0 g/dL   AST 39 15 - 41 U/L   ALT 47 14 - 54 U/L   Alkaline Phosphatase 99 38 - 126 U/L   Total Bilirubin 0.1 (L) 0.3 - 1.2 mg/dL   GFR calc non Af Amer >60 >60 mL/min   GFR calc Af Amer >60 >60 mL/min    Comment: (NOTE) The eGFR has been calculated using the CKD EPI equation. This calculation has not been validated in all clinical situations. eGFR's persistently <60 mL/min signify possible Chronic Kidney Disease.    Anion gap 6 5 - 15  Urine Drug Screen, Qualitative ([facility] only)     Status: None   Collection Time: 10/10/16  5:31 PM  Result Value Ref Range   Tricyclic, Ur Screen NONE DETECTED NONE DETECTED   Amphetamines, Ur Screen NONE DETECTED NONE DETECTED   MDMA (Ecstasy)Ur Screen NONE DETECTED NONE DETECTED   Cocaine Metabolite,Ur [place] NONE DETECTED NONE DETECTED   Opiate, Ur Screen NONE DETECTED NONE DETECTED   Phencyclidine (PCP) Ur S NONE DETECTED NONE DETECTED   Cannabinoid 50 Ng, Ur [place] NONE DETECTED NONE DETECTED   Barbiturates, Ur Screen NONE DETECTED NONE DETECTED   Benzodiazepine, Ur Scrn NONE DETECTED NONE DETECTED   Methadone Scn, Ur NONE DETECTED NONE DETECTED    Comment: (NOTE) 100   Tricyclics, urine               Cutoff 1000 ng/mL 200  Amphetamines, urine             Cutoff 1000 ng/mL 300  MDMA (Ecstasy), urine           Cutoff 500 ng/mL 400  Cocaine  Metabolite, urine       Cutoff 300 ng/mL 500  Opiate, urine                   Cutoff 300 ng/mL 600  Phencyclidine (PCP), urine      Cutoff 25 ng/mL 700  Cannabinoid, urine              Cutoff 50 ng/mL 800  Barbiturates, urine             Cutoff 200 ng/mL 900  Benzodiazepine, urine           Cutoff 200 ng/mL 1000 Methadone, urine                Cutoff 300 ng/mL 1100 1200 The urine drug screen provides only a preliminary, unconfirmed 1300 analytical test result and should not be used for non-medical 1400 purposes. Clinical consideration and professional judgment should 1500 be applied to any positive drug screen result due to possible 1600 interfering substances. A more specific alternate chemical method 1700 must be used in order to obtain a confirmed analytical result.  1800 Gas chromato graphy / mass spectrometry (GC/MS) is the preferred 1900 confirmatory method.   Troponin I     Status: None   Collection Time: 10/11/16  1:36 AM  Result Value Ref Range   Troponin I <0.03 <0.03 ng/mL    No results found.  Review of Systems  Constitutional: Negative for chills and fever.  HENT: Negative for sore throat and tinnitus.   Eyes: Negative for blurred vision and redness.  Respiratory: Negative for cough and shortness of breath.   Cardiovascular: Positive for chest pain. Negative for palpitations, orthopnea and PND.  Gastrointestinal: Negative for abdominal pain, diarrhea, nausea and vomiting.  Genitourinary: Negative for dysuria, frequency and urgency.  Musculoskeletal: Negative for joint pain and myalgias.  Skin: Negative for rash.       No lesions  Neurological: Positive for headaches. Negative for speech change, focal weakness and weakness.  Endo/Heme/Allergies: Does not bruise/bleed easily.       No  temperature intolerance  Psychiatric/Behavioral: Negative for depression and suicidal ideas.   Blood pressure 123/70, pulse 86, temperature 98 F (36.7 C), temperature source Oral, resp. rate 16, last menstrual period 01/13/2016. Physical Exam  Vitals reviewed. Constitutional: She is oriented to person, place, and time. She appears well-developed and well-nourished. No distress.  HENT:  Head: Normocephalic and atraumatic.  Mouth/Throat: Oropharynx is clear and moist.  Eyes: Conjunctivae and EOM are normal. Pupils are equal, round, and reactive to light.  Neck: Normal range of motion. Neck supple. No tracheal deviation present. No thyromegaly present.  Cardiovascular: Normal rate, regular rhythm and normal heart sounds.  Exam reveals no gallop and no friction rub.   No murmur heard. Respiratory: Effort normal and breath sounds normal.  GI: Soft. Bowel sounds are normal. She exhibits no distension. There is no tenderness.  Gravid  Genitourinary:  Genitourinary Comments: Deferred  Lymphadenopathy:    She has no cervical adenopathy.  Neurological: She is alert and oriented to person, place, and time. No cranial nerve deficit. She exhibits normal muscle tone.  Skin: Skin is warm and dry.  Psychiatric: She has a normal mood and affect. Her behavior is normal. Judgment and thought content normal.    Assessment/Plan: This is a 41 year old multiparous female admitted for chest pain and elevated blood pressures. 1. Chest pain: Atypical induration. Negative troponin and normal EKG is reassuring.  Pulmonary embolism ruled out on recent  admission; unlikely with this encounter. The patient had hypertension prior to pregnancy but notably had not been on anti-hypertensive medication during pregnancy. Following her last admission for chest pain the patient was scheduled for an outpatient echocardiogram which she was unable to obtain due to logistics with childcare. No doubt some of her hypertension is  secondary to increased volume associated with pregnancy and or intermittently poor venous return secondary to her gravid abdomen. She does not meet criteria for preeclampsia and at this time she is normotensive. Also, with an appropriately sized blood pressure cuff her readings have been more consistent. However, her recurrent symptoms and high blood pressure readings could be the beginnings of a hypertensive cardiomyopathy. Recommend rescheduling echocardiogram as an outpatient and follow-up with Dr. Clayborn Bigness. 2. Essential hypertension: Currently controlled however the patient needs lifestyle modification including weight loss to decrease risk of long-term end organ damage. Continue to monitor. 3. Morbid obesity: Recommended weight loss and exercise  Thank you for involving [place] in the care of this patient. We will continue to follow if there are further concerns.   Harrie Foreman 10/11/2016, 7:38 AM

## 2016-10-11 NOTE — Progress Notes (Signed)
L&D Progress Note  S: C/O persistent headache despite Fioricet and left sided chest pain radiating to left shoulder. Hungry! No nausea  O: General: calm, in NAD BPs WNL when appropriate sized cuff used BP 106/66   Pulse 93   Temp 98.3 F (36.8 C) (Oral)   Resp 18   LMP 01/13/2016 (Within Days) Comment: pt is pregnant  Heart: RRR without murmur EKG: NSR, no ST elevations FH tracing remains CAT 1 No contractions seen.  A: Left sided chest pain ? Etiology Persistent headache (history of headaches)  P: [facility] internist  Had given Ambien for sleep Can eat  Lovely Kerins, CNM

## 2016-10-11 NOTE — Progress Notes (Signed)
Reviewed discharge instructions with patient. Instructed pt to return back to L&D at 18:00 this evening to receive her second betamethasone shot. Pt agreed and verbalized understanding.

## 2016-10-13 ENCOUNTER — Encounter: Payer: Self-pay | Admitting: *Deleted

## 2016-10-13 DIAGNOSIS — I10 Essential (primary) hypertension: Secondary | ICD-10-CM | POA: Diagnosis not present

## 2016-10-13 DIAGNOSIS — Z79899 Other long term (current) drug therapy: Secondary | ICD-10-CM | POA: Diagnosis not present

## 2016-10-13 DIAGNOSIS — J45909 Unspecified asthma, uncomplicated: Secondary | ICD-10-CM | POA: Diagnosis not present

## 2016-10-13 DIAGNOSIS — Z3A35 35 weeks gestation of pregnancy: Secondary | ICD-10-CM | POA: Diagnosis not present

## 2016-10-13 DIAGNOSIS — O26893 Other specified pregnancy related conditions, third trimester: Secondary | ICD-10-CM | POA: Diagnosis not present

## 2016-10-13 DIAGNOSIS — R0789 Other chest pain: Secondary | ICD-10-CM | POA: Diagnosis not present

## 2016-10-13 DIAGNOSIS — Z87891 Personal history of nicotine dependence: Secondary | ICD-10-CM | POA: Diagnosis not present

## 2016-10-13 DIAGNOSIS — Z7982 Long term (current) use of aspirin: Secondary | ICD-10-CM | POA: Diagnosis not present

## 2016-10-13 NOTE — ED Triage Notes (Addendum)
Pt to triage via wheelchair.  Pt reports chest pain while lying down tonight.  Pt states heart beating fast.  Pain radiates into left arm  Pt is [redacted] weeks pregnant.  Pt saw dr Clayborn Bigness 07/25/16 for similar sx.   No sob. Pt alert

## 2016-10-14 ENCOUNTER — Inpatient Hospital Stay
Admit: 2016-10-14 | Discharge: 2016-10-14 | Disposition: A | Discharge status: Home / Self Care | Payer: Medicaid Other | Attending: Obstetrics & Gynecology | Admitting: Obstetrics & Gynecology

## 2016-10-14 ENCOUNTER — Emergency Department: Payer: Medicaid Other

## 2016-10-14 ENCOUNTER — Emergency Department
Admission: EM | Admit: 2016-10-14 | Discharge: 2016-10-15 | Disposition: A | Discharge status: Still In Hospital | Payer: Medicaid Other | Source: Home / Self Care | Attending: Emergency Medicine | Admitting: Emergency Medicine

## 2016-10-14 ENCOUNTER — Emergency Department
Admission: EM | Admit: 2016-10-14 | Discharge: 2016-10-14 | Disposition: A | Discharge status: Other Acute Inpatient Hospital | Payer: Medicaid Other | Attending: Emergency Medicine | Admitting: Emergency Medicine

## 2016-10-14 ENCOUNTER — Encounter: Payer: Self-pay | Admitting: *Deleted

## 2016-10-14 DIAGNOSIS — Z3A35 35 weeks gestation of pregnancy: Secondary | ICD-10-CM | POA: Insufficient documentation

## 2016-10-14 DIAGNOSIS — I10 Essential (primary) hypertension: Secondary | ICD-10-CM | POA: Insufficient documentation

## 2016-10-14 DIAGNOSIS — O163 Unspecified maternal hypertension, third trimester: Secondary | ICD-10-CM

## 2016-10-14 DIAGNOSIS — O09529 Supervision of elderly multigravida, unspecified trimester: Secondary | ICD-10-CM

## 2016-10-14 DIAGNOSIS — O26893 Other specified pregnancy related conditions, third trimester: Secondary | ICD-10-CM | POA: Insufficient documentation

## 2016-10-14 DIAGNOSIS — F329 Major depressive disorder, single episode, unspecified: Secondary | ICD-10-CM

## 2016-10-14 DIAGNOSIS — O133 Gestational [pregnancy-induced] hypertension without significant proteinuria, third trimester: Secondary | ICD-10-CM

## 2016-10-14 DIAGNOSIS — Z79899 Other long term (current) drug therapy: Secondary | ICD-10-CM | POA: Insufficient documentation

## 2016-10-14 DIAGNOSIS — Z3493 Encounter for supervision of normal pregnancy, unspecified, third trimester: Secondary | ICD-10-CM

## 2016-10-14 DIAGNOSIS — O99342 Other mental disorders complicating pregnancy, second trimester: Secondary | ICD-10-CM

## 2016-10-14 DIAGNOSIS — J45909 Unspecified asthma, uncomplicated: Secondary | ICD-10-CM | POA: Insufficient documentation

## 2016-10-14 DIAGNOSIS — R079 Chest pain, unspecified: Secondary | ICD-10-CM

## 2016-10-14 DIAGNOSIS — Z87891 Personal history of nicotine dependence: Secondary | ICD-10-CM | POA: Insufficient documentation

## 2016-10-14 DIAGNOSIS — I471 Supraventricular tachycardia: Secondary | ICD-10-CM

## 2016-10-14 DIAGNOSIS — F32A Depression, unspecified: Secondary | ICD-10-CM

## 2016-10-14 DIAGNOSIS — R0789 Other chest pain: Secondary | ICD-10-CM | POA: Insufficient documentation

## 2016-10-14 DIAGNOSIS — Z7982 Long term (current) use of aspirin: Secondary | ICD-10-CM | POA: Insufficient documentation

## 2016-10-14 LAB — CBC
HCT: 31.4 % — ABNORMAL LOW (ref 35.0–47.0)
HEMOGLOBIN: 10.3 g/dL — AB (ref 12.0–16.0)
MCH: 26.5 pg (ref 26.0–34.0)
MCHC: 32.8 g/dL (ref 32.0–36.0)
MCV: 80.6 fL (ref 80.0–100.0)
PLATELETS: 312 10*3/uL (ref 150–440)
RBC: 3.89 MIL/uL (ref 3.80–5.20)
RDW: 15.7 % — ABNORMAL HIGH (ref 11.5–14.5)
WBC: 11.1 10*3/uL — ABNORMAL HIGH (ref 3.6–11.0)

## 2016-10-14 LAB — BASIC METABOLIC PANEL
ANION GAP: 6 (ref 5–15)
BUN: 12 mg/dL (ref 6–20)
CALCIUM: 8.2 mg/dL — AB (ref 8.9–10.3)
CO2: 23 mmol/L (ref 22–32)
CREATININE: 0.63 mg/dL (ref 0.44–1.00)
Chloride: 110 mmol/L (ref 101–111)
Glucose, Bld: 141 mg/dL — ABNORMAL HIGH (ref 65–99)
Potassium: 4 mmol/L (ref 3.5–5.1)
Sodium: 139 mmol/L (ref 135–145)

## 2016-10-14 LAB — TROPONIN I

## 2016-10-14 MED ORDER — ADENOSINE 12 MG/4ML IV SOLN
INTRAVENOUS | Status: AC
  Filled 2016-10-14: qty 8

## 2016-10-14 MED ORDER — SODIUM CHLORIDE 0.9 % IV BOLUS (SEPSIS)
1000.0000 mL | Freq: Once | INTRAVENOUS | Status: AC
  Administered 2016-10-14: 1000 mL via INTRAVENOUS

## 2016-10-14 MED ORDER — ADENOSINE 6 MG/2ML IV SOLN
INTRAVENOUS | Status: AC
Start: 2016-10-14 — End: 2016-10-15
  Administered 2016-10-15: 6 mg
  Filled 2016-10-14: qty 2

## 2016-10-14 MED ORDER — ACETAMINOPHEN 500 MG PO TABS
1000.0000 mg | ORAL_TABLET | ORAL | Status: AC
  Administered 2016-10-14: 1000 mg via ORAL
  Filled 2016-10-14: qty 2

## 2016-10-14 MED ORDER — BUTALBITAL-APAP-CAFFEINE 50-325-40 MG PO TABS: 1.0000 | ORAL_TABLET | ORAL | Status: DC | PRN

## 2016-10-14 MED ORDER — BUTALBITAL-APAP-CAFFEINE 50-325-40 MG PO TABS
ORAL_TABLET | ORAL | Status: AC
  Administered 2016-10-14: 1 via ORAL
  Filled 2016-10-14: qty 1

## 2016-10-14 NOTE — Final Progress Note (Signed)
Physician Final Progress Note  Patient ID: Lauren [name] MRN: [id number] DOB/AGE: 10/14/1975 41 y.o.  Admit date: 10/14/2016 Admitting provider: Gae Dry, MD Discharge date: 10/14/2016   Admission Diagnoses: Irregular contractions  Discharge Diagnoses: Irregular contractions  41 yo [id number] at [redacted]w[redacted]d presenting for rule out preterm labor, no regular contractions, cervix 2-3cm, monitored overnight an discharged in AM  Consults: none  Significant Findings/ Diagnostic Studies:  Results for orders placed or performed during the hospital encounter of 10/14/16 (from the past 24 hour(s))  Basic metabolic panel     Status: Abnormal   Collection Time: 10/14/16 12:18 AM  Result Value Ref Range   Sodium 139 135 - 145 mmol/L   Potassium 4.0 3.5 - 5.1 mmol/L   Chloride 110 101 - 111 mmol/L   CO2 23 22 - 32 mmol/L   Glucose, Bld 141 (H) 65 - 99 mg/dL   BUN 12 6 - 20 mg/dL   Creatinine, Ser 0.63 0.44 - 1.00 mg/dL   Calcium 8.2 (L) 8.9 - 10.3 mg/dL   GFR calc non Af Amer >60 >60 mL/min   GFR calc Af Amer >60 >60 mL/min   Anion gap 6 5 - 15  CBC     Status: Abnormal   Collection Time: 10/14/16 12:18 AM  Result Value Ref Range   WBC 11.1 (H) 3.6 - 11.0 K/uL   RBC 3.89 3.80 - 5.20 MIL/uL   Hemoglobin 10.3 (L) 12.0 - 16.0 g/dL   HCT 31.4 (L) 35.0 - 47.0 %   MCV 80.6 80.0 - 100.0 fL   MCH 26.5 26.0 - 34.0 pg   MCHC 32.8 32.0 - 36.0 g/dL   RDW 15.7 (H) 11.5 - 14.5 %   Platelets 312 150 - 440 K/uL  Troponin I     Status: None   Collection Time: 10/14/16 12:18 AM  Result Value Ref Range   Troponin I <0.03 <0.03 ng/mL     Procedures: NST  Discharge Condition: good  Disposition: 02-Transferred to [facility]  Diet: Regular diet  Discharge Activity: as tolerated  Discharge Instructions    Discharge activity:  No Restrictions    Complete by:  As directed    Discharge diet:  No restrictions    Complete by:  As directed    Fetal Kick Count:  Lie on our left side for  one hour after a meal, and count the number of times your baby kicks.  If it is less than 5 times, get up, move around and drink some juice.  Repeat the test 30 minutes later.  If it is still less than 5 kicks in an hour, notify your doctor.    Complete by:  As directed    LABOR:  When conractions begin, you should start to time them from the beginning of one contraction to the beginning  of the next.  When contractions are 5 - 10 minutes apart or less and have been regular for at least an hour, you should call your health care provider.    Complete by:  As directed    No sexual activity restrictions    Complete by:  As directed    Notify physician for bleeding from the vagina    Complete by:  As directed    Notify physician for blurring of vision or spots before the eyes    Complete by:  As directed    Notify physician for chills or fever    Complete by:  As directed  Notify physician for fainting spells, "black outs" or loss of consciousness    Complete by:  As directed    Notify physician for increase in vaginal discharge    Complete by:  As directed    Notify physician for leaking of fluid    Complete by:  As directed    Notify physician for pain or burning when urinating    Complete by:  As directed    Notify physician for pelvic pressure (sudden increase)    Complete by:  As directed    Notify physician for severe or continued nausea or vomiting    Complete by:  As directed    Notify physician for sudden gushing of fluid from the vagina (with or without continued leaking)    Complete by:  As directed    Notify physician for sudden, constant, or occasional abdominal pain    Complete by:  As directed    Notify physician if baby moving less than usual    Complete by:  As directed      Allergies as of 10/14/2016      Reactions   Nucynta [tapentadol] Hives      Medication List    TAKE these medications   acetaminophen 325 MG tablet Commonly known as:  TYLENOL Take 650 mg by  mouth every 6 (six) hours as needed.   aspirin EC 81 MG tablet Take 81 mg by mouth daily.   esomeprazole 40 MG capsule Commonly known as:  NEXIUM Take 40 mg by mouth daily at 12 noon.   multivitamin-prenatal 27-0.8 MG Tabs tablet Take 1 tablet by mouth daily at 12 noon.        Total time spent taking care of this patient: 15 minutes  Signed: Dorthula Nettles 10/14/2016, 8:36 AM

## 2016-10-14 NOTE — ED Triage Notes (Signed)
Patient reports having a rapid heart rate and that she passed out.  Patient also reports that she was seen in the ED yesterday for chest pain.  Patient is [redacted] weeks pregnant.

## 2016-10-14 NOTE — H&P (Signed)
Obstetrics & Gynecology H&P   History of Present Illness: Patient is a 41 y.o. [id number] at 35 week 3 day with pregnancy notable for history chronic hypertension (unmedicated), history of two prior cesarean sections, chronic headaches, morbid obesity, and tachycardia who presented this evening with an achy discomfort in her upper chest that radiates up towards her left shoulder. She reports she's had this in the past, she's had to receive fluids in the emergency room. She was told that she needed to follow-up and was supposed to have a "echocardiogram" but missed the appointment recently. She is concerned about PTL.  She denies any abdominal pain. No contractions. No leakage of any fluid or blood. Does not feel like she is in labor. She is feeling her child move as normal.  She denies any shortness of breath. No cough. No fevers. No ripping tearing or moving discomfort in the chest or back. No swelling in the legs  Desires a repeat CS for this delivery with BTL (has not signed 30 day papers)  About 3 weeks ago and then again earlier this week was evaluated in the ED/L&D for chest pain and left shoulder pain with EKG showing sinus tachycardia, normal troponin, and negative CTA for PE.   Repeat troponin did show a slight bump although her chest symptoms had completely resolved, and she was observed overnight in ICU.   ER today was evaluated for cardiac concerns, and also a neg eval for DVT.  BPs normal in ER.  Review of Systems: Constitutional : no fever, positive for fatigue HEENT: positive for frontal and occipital headache , negative for nasal or sinus congestion, negative sore throat Respiratory: no SOB Digestive: No nausea/vomiting. No diarrhea. Cardiac: denies palpitations. Having some left upper chest pain and numbness into left shoulder (intermittently since her previous admission) GU: see HPI Musculoskeletal: pain in left buttock extending down leg, left lower back pain Past Medical  History:  Past Medical History:  Diagnosis Date  . Advanced maternal age in multigravida   . Asthma   . Depression   . GERD (gastroesophageal reflux disease)   . [place] multiparity   . Headache   . Hypertension   . Leg fracture   . Obesity, morbid, BMI 40.0-49.9 ([place])   . Post traumatic stress disorder   . Pre-eclampsia   . Tachycardia   . Wrist fracture, closed    bilateral   Past Surgical History:  Past Surgical History:  Procedure Laterality Date  . CESAREAN SECTION  2010  . CESAREAN SECTION  2011  . CHOLECYSTECTOMY    . KNEE SURGERY    . ROTATOR CUFF REPAIR    . TONSILLECTOMY     Family History:  Family History  Problem Relation Age of Onset  . Diabetes Sister   . Migraines Maternal Aunt   . Migraines Maternal Uncle   . Migraines Paternal Aunt   . Migraines Paternal Uncle   . Diabetes Sister   . Diabetes Sister   . Diabetes Sister   . Diabetes Sister   . Diabetes Sister   . Ovarian cancer Cousin   . Diabetes Mother   . Diabetes Father   . Migraines Father    Social History:  Single Former smoker  Allergies:  Allergies  Allergen Reactions  . Nucynta [Tapentadol] Hives    Medications: Prior to Admission medications   Medication Sig Start Date End Date Taking? Authorizing Provider  acetaminophen (TYLENOL) 325 MG tablet Take 650 mg by mouth every 6 (six) hours as  needed.   Yes Historical Provider, MD  esomeprazole (NEXIUM) 40 MG capsule Take 40 mg by mouth daily at 12 noon.   Yes Historical Provider, MD  HYDROcodone-acetaminophen (NORCO/VICODIN) 5-325 MG tablet Take 1 tablet by mouth every 6 (six) hours as needed for moderate pain.   Yes Historical Provider, MD  Prenatal Vit-Fe Fumarate-FA (MULTIVITAMIN-PRENATAL) 27-0.8 MG TABS tablet Take 1 tablet by mouth daily at 12 noon.   Yes Historical Provider, MD  aspirin EC 81 MG tablet Take 81 mg by mouth daily.    Historical Provider, MD    Physical Exam:  BP 140-150s/80s on L&D, P80s, R20, No  fever Consyitirional: NAD HEENT: normal Pulmonary: no increased work of breathing/ CTA bilaterally Cardiovascular: RRR without murmur Abdomen: gravid, morbidly obese. BS active.  Contractions: irregular and rare FHR: 135 baseline with accelerations to 150s to 160s, moderate variability. Cervix: 2-3/60/-3 (high station of Vtx) Extremities: trace BLE edema Neurologic: grossly intact; DTRs+1 Psychiatric: mood appropriate  Labs: Results for orders placed or performed during the hospital encounter of 10/14/16 (from the past 72 hour(s))  Basic metabolic panel     Status: Abnormal   Collection Time: 10/14/16 12:18 AM  Result Value Ref Range   Sodium 139 135 - 145 mmol/L   Potassium 4.0 3.5 - 5.1 mmol/L   Chloride 110 101 - 111 mmol/L   CO2 23 22 - 32 mmol/L   Glucose, Bld 141 (H) 65 - 99 mg/dL   BUN 12 6 - 20 mg/dL   Creatinine, Ser 0.63 0.44 - 1.00 mg/dL   Calcium 8.2 (L) 8.9 - 10.3 mg/dL   GFR calc non Af Amer >60 >60 mL/min   GFR calc Af Amer >60 >60 mL/min    Comment: (NOTE) The eGFR has been calculated using the CKD EPI equation. This calculation has not been validated in all clinical situations. eGFR's persistently <60 mL/min signify possible Chronic Kidney Disease.    Anion gap 6 5 - 15  CBC     Status: Abnormal   Collection Time: 10/14/16 12:18 AM  Result Value Ref Range   WBC 11.1 (H) 3.6 - 11.0 K/uL   RBC 3.89 3.80 - 5.20 MIL/uL   Hemoglobin 10.3 (L) 12.0 - 16.0 g/dL   HCT 31.4 (L) 35.0 - 47.0 %   MCV 80.6 80.0 - 100.0 fL   MCH 26.5 26.0 - 34.0 pg   MCHC 32.8 32.0 - 36.0 g/dL   RDW 15.7 (H) 11.5 - 14.5 %   Platelets 312 150 - 440 K/uL  Troponin I     Status: None   Collection Time: 10/14/16 12:18 AM  Result Value Ref Range   Troponin I <0.03 <0.03 ng/mL   Assessment: IUP at 35 3/7 weeks with elevated blood pressures, hx of chronic hypertension and normal labs and pervious work ups; Concern for PTL by patient; evaluated by ER for chest discomfort and  No further  work up recommended  Plan: Fioricet for headache pain Can have diet if blood pressures are not in the severe range.  Check urine as additional data for proteinuria. Fetus  - reactive category I tracing Unlikely PTL based on exam.    Robert Paul Harris, MD    

## 2016-10-14 NOTE — Discharge Instructions (Signed)
You have been seen in the Emergency Department (ED) today for chest pain.  As we have discussed today?s test results are normal, but you may require further testing and are being discharged from the ER and being taken to Labor and Delivery for Dr. Kenton Kingfisher to follow-up with you and exam you this morning.  Please follow up with the recommended doctor as instructed above in these documents regarding today?s emergent visit and your recent symptoms to discuss further management.    Return to the Emergency Department (ED) if you experience any further chest pain/pressure/tightness, difficulty breathing, or sudden sweating, CONTRACTIONS, leakage of vaginal fluid, bleeding, or other symptoms that concern you.

## 2016-10-14 NOTE — ED Provider Notes (Signed)
[facility] Emergency Department Provider Note   ____________________________________________   First MD Initiated Contact with Patient 10/14/16 2358     (approximate)  I have reviewed the triage vital signs and the nursing notes.   HISTORY  Chief Complaint Irregular Heart Beat and Chest Pain    HPI Lauren [name] is a 41 y.o. female who presents to the ED from home with a chief complaint of rapid heart rate. Patient is G11P8 at approximately 35 weeks pregnancy, followed by [facility]. She has had several episodes of chest pain during this pregnancy. She was seen in the ED for chest pain1/08/2017 with negative CT chest for pulmonary embolism. Patient was seen last night in the ED for tachycardia which resolved after IV fluids. States she has had SVT before requiring adenosine conversion; this was approximately 18 months ago. Also has had preeclampsia previously. Reports elevated blood pressures this pregnancy although she is not yet on antihypertensives. States she was resting at home when she felt palpitations with contractions and shortness of breath approximately one hour prior to arrival. Reports feeling so lightheaded she felt like she was going to pass out. States recent cold-like symptoms but has been feeling better this week. Denies fever, chills, nausea, vomiting, vaginal bleeding or diarrhea. With palpitations patient has a sense of chest tightness with shortness of breath and right-sided back pain.   Past Medical History:  Diagnosis Date  . Advanced maternal age in multigravida   . Asthma   . Depression   . GERD (gastroesophageal reflux disease)   . [place] multiparity   . Headache   . Hypertension   . Leg fracture   . Obesity, morbid, BMI 40.0-49.9 ([place])   . Post traumatic stress disorder   . Pre-eclampsia   . Tachycardia   . Wrist fracture, closed    bilateral    Patient Active Problem List   Diagnosis Date Noted  . SVT  (supraventricular tachycardia) ([place]) 10/15/2016  . Elevated blood pressure affecting pregnancy in third trimester, antepartum 10/10/2016  . Chest pain 09/23/2016  . Hypertension complicating pregnancy 99991111  . Obesity 05/30/2016  . Advanced maternal age in multigravida 05/30/2016  . Depression affecting pregnancy in second trimester, antepartum 05/30/2016    Past Surgical History:  Procedure Laterality Date  . CESAREAN SECTION  2010  . CESAREAN SECTION  2011  . CHOLECYSTECTOMY    . KNEE SURGERY    . ROTATOR CUFF REPAIR    . TONSILLECTOMY      Prior to Admission medications   Medication Sig Start Date End Date Taking? Authorizing Provider  acetaminophen (TYLENOL) 325 MG tablet Take 650 mg by mouth every 6 (six) hours as needed.    Historical Provider, MD  aspirin EC 81 MG tablet Take 81 mg by mouth daily.    Historical Provider, MD  esomeprazole (NEXIUM) 40 MG capsule Take 40 mg by mouth daily at 12 noon.    Historical Provider, MD  Prenatal Vit-Fe Fumarate-FA (MULTIVITAMIN-PRENATAL) 27-0.8 MG TABS tablet Take 1 tablet by mouth daily at 12 noon.    Historical Provider, MD    Allergies Nucynta [tapentadol]  Family History  Problem Relation Age of Onset  . Diabetes Sister   . Migraines Maternal Aunt   . Migraines Maternal Uncle   . Migraines Paternal Aunt   . Migraines Paternal Uncle   . Diabetes Sister   . Diabetes Sister   . Diabetes Sister   . Diabetes Sister   . Diabetes Sister   .  Ovarian cancer Cousin   . Diabetes Mother   . Diabetes Father   . Migraines Father     Social History Social History  Substance Use Topics  . Smoking status: Former Smoker    Types: Cigarettes  . Smokeless tobacco: Never Used     Comment: occasional smoker  . Alcohol use No    Review of Systems  Constitutional: No fever/chills. Eyes: No visual changes. ENT: No sore throat. Cardiovascular: Positive for palpitations and chest pain. Respiratory: Positive for shortness of  breath. Gastrointestinal: Positive for contractions. No abdominal pain.  No nausea, no vomiting.  No diarrhea.  No constipation. Genitourinary: Negative for dysuria. Musculoskeletal: Positive for back pain. Skin: Negative for rash. Neurological: Negative for headaches, focal weakness or numbness.  10-point ROS otherwise negative.  ____________________________________________   PHYSICAL EXAM:  VITAL SIGNS: ED Triage Vitals [10/14/16 2350]  Enc Vitals Group     BP (!) 149/115     Pulse Rate (!) 209     Resp (!) 33     Temp 97.8 F (36.6 C)     Temp Source Oral     SpO2 97 %     Weight      Height      Head Circumference      Peak Flow      Pain Score 8     Pain Loc      Pain Edu?      Excl. in [place]?     Constitutional: Alert and oriented. Well appearing and in moderate acute distress. Eyes: Conjunctivae are normal. PERRL. EOMI. Head: Atraumatic. Nose: No congestion/rhinnorhea. Mouth/Throat: Mucous membranes are moist.  Oropharynx non-erythematous. Neck: No stridor.   Cardiovascular: Tachycardic rate, regular rhythm. Grossly normal heart sounds.  Good peripheral circulation. Respiratory: Normal respiratory effort.  No retractions. Lungs CTAB. Gastrointestinal: Gravid and nontender. No distention. No abdominal bruits. No CVA tenderness. Musculoskeletal: No lower extremity tenderness nor edema.  No joint effusions. Neurologic:  Normal speech and language. No gross focal neurologic deficits are appreciated.  Skin:  Skin is warm, dry and intact. No rash noted. Psychiatric: Mood and affect are normal. Speech and behavior are normal.  ____________________________________________   LABS (all labs ordered are listed, but only abnormal results are displayed)  Labs Reviewed  CBC WITH DIFFERENTIAL/PLATELET - Abnormal; Notable for the following:       Result Value   WBC 11.6 (*)    Hemoglobin 10.3 (*)    HCT 31.9 (*)    RDW 15.3 (*)    Neutro Abs 8.1 (*)    All other  components within normal limits  COMPREHENSIVE METABOLIC PANEL - Abnormal; Notable for the following:    Chloride 112 (*)    Glucose, Bld 110 (*)    Calcium 8.2 (*)    Albumin 2.6 (*)    Total Bilirubin 0.1 (*)    All other components within normal limits  URINALYSIS, COMPLETE (UACMP) WITH MICROSCOPIC - Abnormal; Notable for the following:    Color, Urine STRAW (*)    APPearance CLEAR (*)    Squamous Epithelial / LPF 0-5 (*)    All other components within normal limits  TROPONIN I  TSH  T4, FREE  URIC ACID  LACTATE DEHYDROGENASE   ____________________________________________  EKG  ED ECG REPORT I, Marzell Isakson J, the attending physician, personally viewed and interpreted this ECG.   Date: 10/15/2016  EKG Time: 2344  Rate: 206  Rhythm: SVT  Axis: WNL  Intervals:none  ST&T Change:  Nonspecific  ED ECG REPORT I, Quanta Roher J, the attending physician, personally viewed and interpreted this ECG.   Date: 10/15/2016  EKG Time: 0010  Rate: 117  Rhythm: sinus tachycardia  Axis: Normal  Intervals:none  ST&T Change: Nonspecific   ____________________________________________  RADIOLOGY  Chest x-ray obtained last evening which demonstrated mild interstitial prominence; lungs otherwise clear. Borderline cardiomegaly ____________________________________________   PROCEDURES  Procedure(s) performed: None  Procedures  Critical Care performed: Yes, see critical care note(s)  CRITICAL CARE Performed by: Paulette Blanch   Total critical care time: 30 minutes  Critical care time was exclusive of separately billable procedures and treating other patients.  Critical care was necessary to treat or prevent imminent or life-threatening deterioration.  Critical care was time spent personally by me on the following activities: development of treatment plan with patient and/or surrogate as well as nursing, discussions with consultants, evaluation of patient's response to treatment,  examination of patient, obtaining history from patient or surrogate, ordering and performing treatments and interventions, ordering and review of laboratory studies, ordering and review of radiographic studies, pulse oximetry and re-evaluation of patient's condition. ____________________________________________   INITIAL IMPRESSION / ASSESSMENT AND PLAN / ED COURSE  Pertinent labs & imaging results that were available during my care of the patient were reviewed by me and considered in my medical decision making (see chart for details).  41 year old female approximately [redacted] weeks pregnant who presents with SVT. Patient immediately placed on monitor, pacer pads in place and will attempt adenosine.  Clinical Course as of Oct 15 716  Sat Oct 15, 2016  0010 After 6 mg IV adenosine, patient is in sinus tachycardia at a rate of 114. Will check screening lab work, monitor blood pressure and discuss with OB. Consider magnesium infusion if blood pressure remains elevated.  [JS]  17 Spoke with Dr. Star Age; will discharge patient from the ED directly to L&D and he will evaluate her there for potential magnesium infusion/admission for preeclampsia. Patient and spouse updated and are agreeable with plan of care.  [JS]    Clinical Course User Index [JS] Paulette Blanch, MD     ____________________________________________   FINAL CLINICAL IMPRESSION(S) / ED DIAGNOSES  Final diagnoses:  SVT (supraventricular tachycardia) ([place])  Third trimester pregnancy  Pregnancy-induced hypertension in third trimester      NEW MEDICATIONS STARTED DURING THIS VISIT:  Discharge Medication List as of 10/15/2016  1:38 AM       Note:  This document was prepared using Dragon voice recognition software and may include unintentional dictation errors.    Paulette Blanch, MD 10/15/16 [phone]

## 2016-10-14 NOTE — OB Triage Note (Signed)
Pt reports coming to ER this evening for various c/o's.  Notes a h/a starting about 1800 at home, occurring frontally, and at the back of her head, rates 8/10. States tylenol she received in the ER did not help such. Pt notes chest pain starting sometime this evening- says she does not remember, pain travelled down L arm, rating 8-9/10.  Pt states pain was 8-9/10. Now in BP triage reports some" little discomfort " in sternum, and to L chest wall, rating chest discomfort 5/10, reports L arm feels numb. Pt able to move L upper extremity.Pt able to squeeze hands, although L hand grip appears slightly less in strength. Pt reports slight lower back pain on arrival to ER, rating 4/10- pt states it is unchanged. Pt reports sudden onset of nausea, on arrival to Obs 2 room, subsided now, no other episodes earlier.

## 2016-10-14 NOTE — ED Provider Notes (Signed)
[facility] Emergency Department Provider Note   ____________________________________________   First MD Initiated Contact with Patient 10/14/16 0032     (approximate)  I have reviewed the triage vital signs and the nursing notes.   HISTORY  Chief Complaint Chest Pain    HPI Lauren [name] is a 41 y.o. female reports she is [redacted] weeks pregnant. Since this evening she's been experiencing an achy discomfort in her upper chest that radiates up towards her left shoulder. She reports she's had this in the past, she's had to receive fluids in the emergency room. She was told that she needed to follow-up and was supposed to have a "echocardiogram" but missed the appointment recently.  She denies any abdominal pain. No contractions. No leakage of any fluid or blood. Does not feel like she is in labor. She is feeling her child move as normal.  She denies any shortness of breath. No cough. No fevers. No ripping tearing or moving discomfort in the chest or back. No swelling in the legs  No ripping tearing or moving pain.  Past Medical History:  Diagnosis Date  . Advanced maternal age in multigravida   . Asthma   . Depression   . GERD (gastroesophageal reflux disease)   . [place] multiparity   . Headache   . Hypertension   . Leg fracture   . Obesity, morbid, BMI 40.0-49.9 ([place])   . Post traumatic stress disorder   . Pre-eclampsia   . Tachycardia   . Wrist fracture, closed    bilateral    Patient Active Problem List   Diagnosis Date Noted  . Elevated blood pressure affecting pregnancy in third trimester, antepartum 10/10/2016  . Chest pain 09/23/2016  . Hypertension complicating pregnancy 99991111  . Obesity 05/30/2016  . Advanced maternal age in multigravida 05/30/2016  . Depression affecting pregnancy in second trimester, antepartum 05/30/2016    Past Surgical History:  Procedure Laterality Date  . CESAREAN SECTION  2010  . CESAREAN SECTION   2011  . CHOLECYSTECTOMY    . KNEE SURGERY    . ROTATOR CUFF REPAIR    . TONSILLECTOMY      Prior to Admission medications   Medication Sig Start Date End Date Taking? Authorizing Provider  acetaminophen (TYLENOL) 325 MG tablet Take 650 mg by mouth every 6 (six) hours as needed.    Historical Provider, MD  aspirin EC 81 MG tablet Take 81 mg by mouth daily.    Historical Provider, MD  esomeprazole (NEXIUM) 40 MG capsule Take 40 mg by mouth daily at 12 noon.    Historical Provider, MD  Prenatal Vit-Fe Fumarate-FA (MULTIVITAMIN-PRENATAL) 27-0.8 MG TABS tablet Take 1 tablet by mouth daily at 12 noon.    Historical Provider, MD    Allergies Nucynta [tapentadol]  Family History  Problem Relation Age of Onset  . Diabetes Sister   . Migraines Maternal Aunt   . Migraines Maternal Uncle   . Migraines Paternal Aunt   . Migraines Paternal Uncle   . Diabetes Sister   . Diabetes Sister   . Diabetes Sister   . Diabetes Sister   . Diabetes Sister   . Ovarian cancer Cousin     Social History Social History  Substance Use Topics  . Smoking status: Former Smoker    Types: Cigarettes  . Smokeless tobacco: Never Used     Comment: occasional smoker  . Alcohol use No    Review of Systems Constitutional: No fever/chills Eyes: No  visual changes. ENT: No sore throat. Cardiovascular: See history of present illness Respiratory: Denies shortness of breath. Gastrointestinal: No abdominal pain.   Genitourinary: Negative for dysuria. Musculoskeletal: Negative for back pain. Skin: Negative for rash. Neurological: Negative for headaches, focal weakness or numbness.  10-point ROS otherwise negative.  ____________________________________________   PHYSICAL EXAM:  VITAL SIGNS: ED Triage Vitals  Enc Vitals Group     BP 10/13/16 2356 119/70     Pulse Rate 10/13/16 2356 (!) 120     Resp 10/13/16 2356 20     Temp 10/14/16 0001 98.4 F (36.9 C)     Temp Source 10/13/16 2356 Oral     SpO2  10/13/16 2356 100 %     Weight 10/13/16 2354 (!) 301 lb (136.5 kg)     Height 10/13/16 2354 5\' 4"  (1.626 m)     Head Circumference --      Peak Flow --      Pain Score 10/13/16 2354 8     Pain Loc --      Pain Edu? --      Excl. in [place]? --     Constitutional: Alert and oriented. Well appearing and in no acute distress. Eyes: Conjunctivae are normal. PERRL. EOMI. Head: Atraumatic. Nose: No congestion/rhinnorhea. Mouth/Throat: Mucous membranes are moist.  Oropharynx non-erythematous. Neck: No stridor.   Cardiovascular: Normal rate, regular rhythm. Grossly normal heart sounds.  Good peripheral circulation. Respiratory: Normal respiratory effort.  No retractions. Lungs CTAB. Gastrointestinal: Soft and nontender. No distention. No abdominal bruits. No CVA tenderness. Musculoskeletal: No lower extremity tenderness nor edema.  No joint effusions. Neurologic:  Normal speech and language. No gross focal neurologic deficits are appreciated. No gait instability. Skin:  Skin is warm, dry and intact. No rash noted. Psychiatric: Mood and affect are normal. Speech and behavior are normal.  ____________________________________________   LABS (all labs ordered are listed, but only abnormal results are displayed)  Labs Reviewed  BASIC METABOLIC PANEL - Abnormal; Notable for the following:       Result Value   Glucose, Bld 141 (*)    Calcium 8.2 (*)    All other components within normal limits  CBC - Abnormal; Notable for the following:    WBC 11.1 (*)    Hemoglobin 10.3 (*)    HCT 31.4 (*)    RDW 15.7 (*)    All other components within normal limits  TROPONIN I  TROPONIN I  BRAIN NATRIURETIC PEPTIDE  CBC  COMPREHENSIVE METABOLIC PANEL   ____________________________________________  EKG  ED ECG REPORT I, Nahsir Venezia, the attending physician, personally viewed and interpreted this ECG.  Date: 10/14/2016 EKG Time: 2356 Rate: 118 Rhythm: normal sinus rhythm QRS Axis:  normal Intervals: normal ST/T Wave abnormalities: normal Conduction Disturbances: none Narrative Interpretation: Sinus tachycardia  ____________________________________________  RADIOLOGY  Dg Chest 1 View  Result Date: 10/14/2016 CLINICAL DATA:  Acute onset of generalized chest pain radiating to the left arm. Tachycardia. Initial encounter. EXAM: CHEST 1 VIEW COMPARISON:  Chest radiograph performed 12/27/2015, and CTA of the chest performed 09/23/2016 FINDINGS: The lungs are well-aerated. Mild interstitial prominence is noted. There is no evidence of pleural effusion or pneumothorax. The cardiomediastinal silhouette is borderline enlarged. No acute osseous abnormalities are seen. IMPRESSION: Mild interstitial prominence; lungs otherwise clear. Borderline cardiomegaly. Electronically Signed   By: Garald Balding M.D.   On: 10/14/2016 01:11   US Venous Img Lower Bilateral  Result Date: 10/14/2016 CLINICAL DATA:  Pregnant patient in third trimester  pregnancy with chest pain. Evaluate for DVT. EXAM: BILATERAL LOWER EXTREMITY VENOUS DOPPLER ULTRASOUND TECHNIQUE: Gray-scale sonography with graded compression, as well as color Doppler and duplex ultrasound were performed to evaluate the lower extremity deep venous systems from the level of the common femoral vein and including the common femoral, femoral, profunda femoral, popliteal and calf veins including the posterior tibial, peroneal and gastrocnemius veins when visible. The superficial great saphenous vein was also interrogated. Spectral Doppler was utilized to evaluate flow at rest and with distal augmentation maneuvers in the common femoral, femoral and popliteal veins. COMPARISON:  None. FINDINGS: RIGHT LOWER EXTREMITY Common Femoral Vein: No evidence of thrombus. Normal compressibility, respiratory phasicity and response to augmentation. Saphenofemoral Junction: No evidence of thrombus. Normal compressibility and flow on color Doppler imaging.  Profunda Femoral Vein: No evidence of thrombus. Normal compressibility and flow on color Doppler imaging. Femoral Vein: No evidence of thrombus. Normal compressibility, respiratory phasicity and response to augmentation. Popliteal Vein: No evidence of thrombus. Normal compressibility, respiratory phasicity and response to augmentation. Calf Veins: No evidence of thrombus. Normal compressibility and flow on color Doppler imaging. Superficial Great Saphenous Vein: No evidence of thrombus. Normal compressibility and flow on color Doppler imaging. Venous Reflux:  None. Other Findings:  None. LEFT LOWER EXTREMITY Common Femoral Vein: No evidence of thrombus. Normal compressibility, respiratory phasicity and response to augmentation. Saphenofemoral Junction: No evidence of thrombus. Normal compressibility and flow on color Doppler imaging. Profunda Femoral Vein: No evidence of thrombus. Normal compressibility and flow on color Doppler imaging. Femoral Vein: No evidence of thrombus. Normal compressibility, respiratory phasicity and response to augmentation. Popliteal Vein: No evidence of thrombus. Normal compressibility, respiratory phasicity and response to augmentation. Calf Veins: No evidence of thrombus in the posterior tibial vein. Normal compressibility and flow on color Doppler imaging. Peroneal vein is not visualized. Superficial Great Saphenous Vein: No evidence of thrombus. Normal compressibility and flow on color Doppler imaging. Venous Reflux:  None. Other Findings:  None. IMPRESSION: No evidence of bilateral lower extremity deep venous thrombosis. Electronically Signed   By: Jeb Levering M.D.   On: 10/14/2016 03:33   Of note, patient had a CT angiography without pulmonary embolism less than 1 month ago   ____________________________________________   PROCEDURES  Procedure(s) performed: None  Procedures  Critical Care performed: No  ____________________________________________   INITIAL  IMPRESSION / ASSESSMENT AND PLAN / ED COURSE  Pertinent labs & imaging results that were available during my care of the patient were reviewed by me and considered in my medical decision making (see chart for details).  Patient transfer evaluation of chest discomfort. Describes an atypical aching pain up over the left upper chest and left shoulder. Reassuring EKG and troponin. Atypical symptoms, no heavy chest pressure. No pleuritic component. No signs of DVT by clinical examination or history. She had a recent CT angiography that was negative for pulmonary emboli some, today I'll obtain Doppler of the lower extremities to assure no DVTs which would raise my suspicion, but given the patient's pregnancy status and a recent CT angiography I do not believe further workup for pulmonary most and is necessary at this time.  No systemic symptoms. No fever. No infectious symptoms.  Clinical Course as of Oct 15 351  Fri Oct 14, 2016  0201 Discussed with Dr. Karie Kirks he knows well. Agreeable with the plan to send patient to labor and delivery after being cleared from the emergency room.  [MQ]    Clinical Course User Index [MQ] Elta Guadeloupe  Jacqualine Code, MD   Discussed with Dr. Kenton Kingfisher, he knows Mrs. Debrosse well, is aware of for workups in the past for chest discomfort as well. He advises that patient may be discharged from the ER and taken for him to speak with and see labor and delivery thereafter once clear.  Vitals:   10/14/16 0130 10/14/16 0145  BP: 130/78 122/69  Pulse: (!) 105 (!) 106  Resp: (!) 26 (!) 26  Temp:     ----------------------------------------- 3:59 AM on 10/14/2016 ----------------------------------------- Blood pressure normal. No symptoms of dissection. No EKG changes to be concern for coronary dissection or acute coronary syndrome. Normal troponin. No abdominal complaint.  After receiving fluids, patient's heart rate improved, respiratory and comfortably with clear lungs. She has no  complaints  Patient agreeable with plan to go see Dr. Kenton Kingfisher in labor and delivery.  Return precautions and treatment recommendations and follow-up discussed with the patient who is agreeable with the plan.  ____________________________________________   FINAL CLINICAL IMPRESSION(S) / ED DIAGNOSES  Final diagnoses:  Chest discomfort      NEW MEDICATIONS STARTED DURING THIS VISIT:  New Prescriptions   No medications on file     Note:  This document was prepared using Dragon voice recognition software and may include unintentional dictation errors.     Delman Kitten, MD 10/14/16 0400

## 2016-10-14 NOTE — Progress Notes (Addendum)
RN to the [facility] for a.m. assessment. VS WNL, Cat. 1 fetal tracing, no contractions noted.  Pt.'s original concerns were address by ER (SOB, Chest Pains) pt. was cleared before OB triage started. IV removed - IV in place upon arrival to [place] - located in left hand.  Reg. Diet given, pt. Ready for discharge.

## 2016-10-15 ENCOUNTER — Observation Stay
Admission: EM | Admit: 2016-10-15 | Discharge: 2016-10-15 | Disposition: A | Discharge status: Other Acute Inpatient Hospital | Payer: Medicaid Other | Attending: Obstetrics and Gynecology | Admitting: Obstetrics and Gynecology

## 2016-10-15 ENCOUNTER — Ambulatory Visit (HOSPITAL_COMMUNITY)
Admission: AD | Admit: 2016-10-15 | Discharge: 2016-10-15 | Disposition: A | Discharge status: Home / Self Care | Payer: Medicaid Other | Source: Other Acute Inpatient Hospital | Attending: Obstetrics and Gynecology | Admitting: Obstetrics and Gynecology

## 2016-10-15 DIAGNOSIS — O99513 Diseases of the respiratory system complicating pregnancy, third trimester: Secondary | ICD-10-CM | POA: Diagnosis not present

## 2016-10-15 DIAGNOSIS — I471 Supraventricular tachycardia, unspecified: Secondary | ICD-10-CM | POA: Diagnosis present

## 2016-10-15 DIAGNOSIS — Z87891 Personal history of nicotine dependence: Secondary | ICD-10-CM | POA: Diagnosis not present

## 2016-10-15 DIAGNOSIS — J45909 Unspecified asthma, uncomplicated: Secondary | ICD-10-CM | POA: Insufficient documentation

## 2016-10-15 DIAGNOSIS — O99213 Obesity complicating pregnancy, third trimester: Secondary | ICD-10-CM | POA: Diagnosis not present

## 2016-10-15 DIAGNOSIS — O26893 Other specified pregnancy related conditions, third trimester: Secondary | ICD-10-CM | POA: Diagnosis not present

## 2016-10-15 DIAGNOSIS — F329 Major depressive disorder, single episode, unspecified: Secondary | ICD-10-CM | POA: Diagnosis not present

## 2016-10-15 DIAGNOSIS — O1493 Unspecified pre-eclampsia, third trimester: Secondary | ICD-10-CM | POA: Insufficient documentation

## 2016-10-15 DIAGNOSIS — Z3A35 35 weeks gestation of pregnancy: Secondary | ICD-10-CM | POA: Diagnosis not present

## 2016-10-15 DIAGNOSIS — Z6841 Body Mass Index (BMI) 40.0 and over, adult: Secondary | ICD-10-CM | POA: Insufficient documentation

## 2016-10-15 DIAGNOSIS — F4310 Post-traumatic stress disorder, unspecified: Secondary | ICD-10-CM | POA: Insufficient documentation

## 2016-10-15 DIAGNOSIS — O99343 Other mental disorders complicating pregnancy, third trimester: Secondary | ICD-10-CM | POA: Insufficient documentation

## 2016-10-15 DIAGNOSIS — Z7982 Long term (current) use of aspirin: Secondary | ICD-10-CM | POA: Insufficient documentation

## 2016-10-15 LAB — COMPREHENSIVE METABOLIC PANEL
ALK PHOS: 98 U/L (ref 38–126)
ALT: 47 U/L (ref 14–54)
ANION GAP: 5 (ref 5–15)
AST: 33 U/L (ref 15–41)
Albumin: 2.6 g/dL — ABNORMAL LOW (ref 3.5–5.0)
BILIRUBIN TOTAL: 0.1 mg/dL — AB (ref 0.3–1.2)
BUN: 12 mg/dL (ref 6–20)
CALCIUM: 8.2 mg/dL — AB (ref 8.9–10.3)
CO2: 22 mmol/L (ref 22–32)
Chloride: 112 mmol/L — ABNORMAL HIGH (ref 101–111)
Creatinine, Ser: 0.66 mg/dL (ref 0.44–1.00)
Glucose, Bld: 110 mg/dL — ABNORMAL HIGH (ref 65–99)
POTASSIUM: 3.9 mmol/L (ref 3.5–5.1)
Sodium: 139 mmol/L (ref 135–145)
TOTAL PROTEIN: 6.9 g/dL (ref 6.5–8.1)

## 2016-10-15 LAB — URINALYSIS, COMPLETE (UACMP) WITH MICROSCOPIC
Bacteria, UA: NONE SEEN
Bilirubin Urine: NEGATIVE
GLUCOSE, UA: NEGATIVE mg/dL
Hgb urine dipstick: NEGATIVE
KETONES UR: NEGATIVE mg/dL
Leukocytes, UA: NEGATIVE
Nitrite: NEGATIVE
PH: 6 (ref 5.0–8.0)
Protein, ur: NEGATIVE mg/dL
SPECIFIC GRAVITY, URINE: 1.011 (ref 1.005–1.030)

## 2016-10-15 LAB — URIC ACID: Uric Acid, Serum: 3.6 mg/dL (ref 2.3–6.6)

## 2016-10-15 LAB — CBC WITH DIFFERENTIAL/PLATELET
BASOS PCT: 1 %
Basophils Absolute: 0.1 10*3/uL (ref 0–0.1)
Eosinophils Absolute: 0.3 10*3/uL (ref 0–0.7)
Eosinophils Relative: 3 %
HEMATOCRIT: 31.9 % — AB (ref 35.0–47.0)
HEMOGLOBIN: 10.3 g/dL — AB (ref 12.0–16.0)
LYMPHS ABS: 2.3 10*3/uL (ref 1.0–3.6)
Lymphocytes Relative: 19 %
MCH: 26.4 pg (ref 26.0–34.0)
MCHC: 32.4 g/dL (ref 32.0–36.0)
MCV: 81.5 fL (ref 80.0–100.0)
MONO ABS: 0.8 10*3/uL (ref 0.2–0.9)
MONOS PCT: 7 %
Neutro Abs: 8.1 10*3/uL — ABNORMAL HIGH (ref 1.4–6.5)
Neutrophils Relative %: 70 %
Platelets: 289 10*3/uL (ref 150–440)
RBC: 3.91 MIL/uL (ref 3.80–5.20)
RDW: 15.3 % — AB (ref 11.5–14.5)
WBC: 11.6 10*3/uL — ABNORMAL HIGH (ref 3.6–11.0)

## 2016-10-15 LAB — URINE DRUG SCREEN, QUALITATIVE (ARMC ONLY)
Amphetamines, Ur Screen: NOT DETECTED
Barbiturates, Ur Screen: POSITIVE — AB
Benzodiazepine, Ur Scrn: NOT DETECTED
CANNABINOID 50 NG, UR ~~LOC~~: NOT DETECTED
COCAINE METABOLITE, UR ~~LOC~~: NOT DETECTED
MDMA (ECSTASY) UR SCREEN: NOT DETECTED
Methadone Scn, Ur: NOT DETECTED
Opiate, Ur Screen: NOT DETECTED
PHENCYCLIDINE (PCP) UR S: NOT DETECTED
Tricyclic, Ur Screen: NOT DETECTED

## 2016-10-15 LAB — LACTATE DEHYDROGENASE: LDH: 146 U/L (ref 98–192)

## 2016-10-15 LAB — TROPONIN I

## 2016-10-15 LAB — PROTEIN / CREATININE RATIO, URINE
Creatinine, Urine: 48 mg/dL
Total Protein, Urine: <6 mg/dL

## 2016-10-15 LAB — TSH: TSH: 1.444 u[IU]/mL (ref 0.350–4.500)

## 2016-10-15 LAB — T4, FREE: FREE T4: 0.67 ng/dL (ref 0.61–1.12)

## 2016-10-15 MED ORDER — SODIUM CHLORIDE 0.9 % IV BOLUS (SEPSIS)
1000.0000 mL | Freq: Once | INTRAVENOUS | Status: AC
Start: 2016-10-15 — End: 2016-10-15
  Administered 2016-10-15: 1000 mL via INTRAVENOUS

## 2016-10-15 MED ORDER — MAGNESIUM SULFATE 4 GM/100ML IV SOLN
INTRAVENOUS | Status: AC
  Administered 2016-10-15: 4 g
  Filled 2016-10-15: qty 100

## 2016-10-15 MED ORDER — LABETALOL HCL 5 MG/ML IV SOLN
20.0000 mg | INTRAVENOUS | Status: DC | PRN
  Administered 2016-10-15: 20 mg via INTRAVENOUS

## 2016-10-15 MED ORDER — ACETAMINOPHEN 325 MG PO TABS: 650.0000 mg | ORAL_TABLET | ORAL | Status: DC | PRN

## 2016-10-15 MED ORDER — MAGNESIUM SULFATE BOLUS VIA INFUSION
4.0000 g | Freq: Once | INTRAVENOUS | Status: DC
  Filled 2016-10-15: qty 500

## 2016-10-15 MED ORDER — MAGNESIUM SULFATE 50 % IJ SOLN
2.0000 g/h | INTRAVENOUS | Status: DC
  Administered 2016-10-15: 2 g/h via INTRAVENOUS
  Filled 2016-10-15: qty 80

## 2016-10-15 MED ORDER — PRENATAL MULTIVITAMIN CH: 1.0000 | ORAL_TABLET | Freq: Every day | ORAL | Status: DC

## 2016-10-15 MED ORDER — LABETALOL HCL 5 MG/ML IV SOLN
INTRAVENOUS | Status: AC
  Administered 2016-10-15: 20 mg via INTRAVENOUS
  Filled 2016-10-15: qty 4

## 2016-10-15 MED ORDER — ASPIRIN EC 81 MG PO TBEC
81.0000 mg | DELAYED_RELEASE_TABLET | Freq: Every day | ORAL | Status: DC
  Filled 2016-10-15: qty 1

## 2016-10-15 MED ORDER — MAGNESIUM SULFATE 50 % IJ SOLN
INTRAMUSCULAR | Status: AC
  Filled 2016-10-15: qty 80

## 2016-10-15 MED ORDER — SODIUM CHLORIDE FLUSH 0.9 % IV SOLN
INTRAVENOUS | Status: AC
  Filled 2016-10-15: qty 10

## 2016-10-15 NOTE — Progress Notes (Signed)
Called report to Tiffany, RN for [facility]. Transport said they will be here in about 30 minutes.

## 2016-10-15 NOTE — Discharge Instructions (Signed)
You will be taken to labor and delivery now for further evaluation by your OB team. Return to the ER for worsening or recurrent symptoms, persistent vomiting, chest pain, difficulty breathing or other concerns.
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
## 2016-10-15 NOTE — ED Notes (Signed)
Discharge instructions reviewed with patient; patient is aware that she is going up to labor and delivery to be monitored; nurse of the labor and delivery given report; pt has 2 IV's that will stay in place until pt is discharge from labor and delivery.

## 2016-10-17 ENCOUNTER — Other Ambulatory Visit: Payer: Self-pay | Admitting: *Deleted

## 2016-10-17 ENCOUNTER — Inpatient Hospital Stay: Admission: RE | Admit: 2016-10-17 | Payer: Medicaid Other | Source: Ambulatory Visit

## 2016-10-17 DIAGNOSIS — E669 Obesity, unspecified: Secondary | ICD-10-CM

## 2016-10-17 DIAGNOSIS — O119 Pre-existing hypertension with pre-eclampsia, unspecified trimester: Secondary | ICD-10-CM | POA: Insufficient documentation

## 2016-10-18 NOTE — Discharge Summary (Signed)
Physician Final Progress Note  Patient ID: Lauren [name] MRN: [id number] DOB/AGE: 1976/09/10 41 y.o.  Admit date: 10/15/2016 Admitting provider: Malachy Mood, MD Discharge date: 10/18/2016   Admission Diagnoses: SVT  Discharge Diagnoses:  Active Problems:   SVT (supraventricular tachycardia) (HCC) Superimposed preeclampsia  Consults: None  Significant Findings/ Diagnostic Studies:  Recent Results (from the past 2160 hour(s))  Basic metabolic panel     Status: Abnormal   Collection Time: 08/23/16 12:49 PM  Result Value Ref Range   Sodium 136 135 - 145 mmol/L   Potassium 3.9 3.5 - 5.1 mmol/L   Chloride 107 101 - 111 mmol/L   CO2 23 22 - 32 mmol/L   Glucose, Bld 92 65 - 99 mg/dL   BUN 13 6 - 20 mg/dL   Creatinine, Ser 0.67 0.44 - 1.00 mg/dL   Calcium 8.5 (L) 8.9 - 10.3 mg/dL   GFR calc non Af Amer >60 >60 mL/min   GFR calc Af Amer >60 >60 mL/min    Comment: (NOTE) The eGFR has been calculated using the CKD EPI equation. This calculation has not been validated in all clinical situations. eGFR's persistently <60 mL/min signify possible Chronic Kidney Disease.    Anion gap 6 5 - 15  CBC     Status: Abnormal   Collection Time: 08/23/16 12:49 PM  Result Value Ref Range   WBC 10.6 3.6 - 11.0 K/uL   RBC 4.13 3.80 - 5.20 MIL/uL   Hemoglobin 11.0 (L) 12.0 - 16.0 g/dL   HCT 34.6 (L) 35.0 - 47.0 %   MCV 83.8 80.0 - 100.0 fL   MCH 26.7 26.0 - 34.0 pg   MCHC 31.9 (L) 32.0 - 36.0 g/dL   RDW 15.6 (H) 11.5 - 14.5 %   Platelets 326 150 - 440 K/uL  Troponin I     Status: None   Collection Time: 08/23/16 12:49 PM  Result Value Ref Range   Troponin I <0.03 <0.03 ng/mL  Troponin I     Status: None   Collection Time: 08/23/16  3:22 PM  Result Value Ref Range   Troponin I <0.03 <0.03 ng/mL  Troponin I     Status: None   Collection Time: 08/23/16  4:55 PM  Result Value Ref Range   Troponin I <0.03 <0.03 ng/mL  Basic metabolic panel     Status: Abnormal   Collection Time:  09/23/16  6:01 PM  Result Value Ref Range   Sodium 138 135 - 145 mmol/L   Potassium 3.5 3.5 - 5.1 mmol/L   Chloride 106 101 - 111 mmol/L   CO2 23 22 - 32 mmol/L   Glucose, Bld 112 (H) 65 - 99 mg/dL   BUN 11 6 - 20 mg/dL   Creatinine, Ser 0.64 0.44 - 1.00 mg/dL   Calcium 8.8 (L) 8.9 - 10.3 mg/dL   GFR calc non Af Amer >60 >60 mL/min   GFR calc Af Amer >60 >60 mL/min    Comment: (NOTE) The eGFR has been calculated using the CKD EPI equation. This calculation has not been validated in all clinical situations. eGFR's persistently <60 mL/min signify possible Chronic Kidney Disease.    Anion gap 9 5 - 15  CBC     Status: Abnormal   Collection Time: 09/23/16  6:01 PM  Result Value Ref Range   WBC 9.9 3.6 - 11.0 K/uL   RBC 4.29 3.80 - 5.20 MIL/uL   Hemoglobin 11.4 (L) 12.0 - 16.0 g/dL   HCT 34.6 (  L) 35.0 - 47.0 %   MCV 80.7 80.0 - 100.0 fL   MCH 26.5 26.0 - 34.0 pg   MCHC 32.8 32.0 - 36.0 g/dL   RDW 15.2 (H) 11.5 - 14.5 %   Platelets 338 150 - 440 K/uL  Troponin I     Status: None   Collection Time: 09/23/16  6:01 PM  Result Value Ref Range   Troponin I <0.03 <0.03 ng/mL  Troponin I     Status: Abnormal   Collection Time: 09/23/16  8:50 PM  Result Value Ref Range   Troponin I 0.06 (HH) <0.03 ng/mL    Comment: CRITICAL RESULT CALLED TO, READ BACK BY AND VERIFIED WITH MOLLY TROTT 09/23/16 @ 2122  MLK   Urine Drug Screen, Qualitative ([place] only)     Status: Abnormal   Collection Time: 09/23/16  9:39 PM  Result Value Ref Range   Tricyclic, Ur Screen NONE DETECTED NONE DETECTED   Amphetamines, Ur Screen NONE DETECTED NONE DETECTED   MDMA (Ecstasy)Ur Screen NONE DETECTED NONE DETECTED   Cocaine Metabolite,Ur [place] NONE DETECTED NONE DETECTED   Opiate, Ur Screen POSITIVE (A) NONE DETECTED   Phencyclidine (PCP) Ur S NONE DETECTED NONE DETECTED   Cannabinoid 50 Ng, Ur [place] NONE DETECTED NONE DETECTED   Barbiturates, Ur Screen NONE DETECTED NONE DETECTED   Benzodiazepine, Ur Scrn NONE  DETECTED NONE DETECTED   Methadone Scn, Ur NONE DETECTED NONE DETECTED    Comment: (NOTE) 381  Tricyclics, urine               Cutoff 1000 ng/mL 200  Amphetamines, urine             Cutoff 1000 ng/mL 300  MDMA (Ecstasy), urine           Cutoff 500 ng/mL 400  Cocaine Metabolite, urine       Cutoff 300 ng/mL 500  Opiate, urine                   Cutoff 300 ng/mL 600  Phencyclidine (PCP), urine      Cutoff 25 ng/mL 700  Cannabinoid, urine              Cutoff 50 ng/mL 800  Barbiturates, urine             Cutoff 200 ng/mL 900  Benzodiazepine, urine           Cutoff 200 ng/mL 1000 Methadone, urine                Cutoff 300 ng/mL 1100 1200 The urine drug screen provides only a preliminary, unconfirmed 1300 analytical test result and should not be used for non-medical 1400 purposes. Clinical consideration and professional judgment should 1500 be applied to any positive drug screen result due to possible 1600 interfering substances. A more specific alternate chemical method 1700 must be used in order to obtain a confirmed analytical result.  1800 Gas chromato graphy / mass spectrometry (GC/MS) is the preferred 1900 confirmatory method.   Troponin I     Status: Abnormal   Collection Time: 09/24/16  5:44 AM  Result Value Ref Range   Troponin I 0.04 (HH) <0.03 ng/mL    Comment: CRITICAL VALUE NOTED. VALUE IS CONSISTENT WITH PREVIOUSLY REPORTED/CALLED VALUE. [place].  Urinalysis, Complete w Microscopic     Status: Abnormal   Collection Time: 10/04/16 10:46 PM  Result Value Ref Range   Color, Urine YELLOW (A) YELLOW   APPearance CLEAR (A) CLEAR  Specific Gravity, Urine 1.032 (H) 1.005 - 1.030   pH 5.0 5.0 - 8.0   Glucose, UA NEGATIVE NEGATIVE mg/dL   Hgb urine dipstick NEGATIVE NEGATIVE   Bilirubin Urine NEGATIVE NEGATIVE   Ketones, ur 5 (A) NEGATIVE mg/dL   Protein, ur 30 (A) NEGATIVE mg/dL   Nitrite NEGATIVE NEGATIVE   Leukocytes, UA SMALL (A) NEGATIVE   RBC / HPF 0-5 0 - 5 RBC/hpf   WBC,  UA 0-5 0 - 5 WBC/hpf   Bacteria, UA RARE (A) NONE SEEN   Squamous Epithelial / LPF 0-5 (A) NONE SEEN   Mucous PRESENT   CBC     Status: Abnormal   Collection Time: 10/10/16  5:31 PM  Result Value Ref Range   WBC 10.2 3.6 - 11.0 K/uL   RBC 3.83 3.80 - 5.20 MIL/uL   Hemoglobin 10.2 (L) 12.0 - 16.0 g/dL   HCT 31.1 (L) 35.0 - 47.0 %   MCV 81.2 80.0 - 100.0 fL   MCH 26.7 26.0 - 34.0 pg   MCHC 32.9 32.0 - 36.0 g/dL   RDW 15.0 (H) 11.5 - 14.5 %   Platelets 279 150 - 440 K/uL  Protein / creatinine ratio, urine     Status: None   Collection Time: 10/10/16  5:31 PM  Result Value Ref Range   Creatinine, Urine 258 mg/dL   Total Protein, Urine 27 mg/dL    Comment: NO NORMAL RANGE ESTABLISHED FOR THIS TEST   Protein Creatinine Ratio 0.10 0.00 - 0.15 mg/mg[Cre]  Comprehensive metabolic panel     Status: Abnormal   Collection Time: 10/10/16  5:31 PM  Result Value Ref Range   Sodium 134 (L) 135 - 145 mmol/L   Potassium 3.4 (L) 3.5 - 5.1 mmol/L   Chloride 108 101 - 111 mmol/L   CO2 20 (L) 22 - 32 mmol/L   Glucose, Bld 91 65 - 99 mg/dL   BUN 10 6 - 20 mg/dL   Creatinine, Ser 0.63 0.44 - 1.00 mg/dL   Calcium 8.4 (L) 8.9 - 10.3 mg/dL   Total Protein 6.5 6.5 - 8.1 g/dL   Albumin 2.6 (L) 3.5 - 5.0 g/dL   AST 39 15 - 41 U/L   ALT 47 14 - 54 U/L   Alkaline Phosphatase 99 38 - 126 U/L   Total Bilirubin 0.1 (L) 0.3 - 1.2 mg/dL   GFR calc non Af Amer >60 >60 mL/min   GFR calc Af Amer >60 >60 mL/min    Comment: (NOTE) The eGFR has been calculated using the CKD EPI equation. This calculation has not been validated in all clinical situations. eGFR's persistently <60 mL/min signify possible Chronic Kidney Disease.    Anion gap 6 5 - 15  Urine Drug Screen, Qualitative ([facility] only)     Status: None   Collection Time: 10/10/16  5:31 PM  Result Value Ref Range   Tricyclic, Ur Screen NONE DETECTED NONE DETECTED   Amphetamines, Ur Screen NONE DETECTED NONE DETECTED   MDMA (Ecstasy)Ur Screen NONE  DETECTED NONE DETECTED   Cocaine Metabolite,Ur [place] NONE DETECTED NONE DETECTED   Opiate, Ur Screen NONE DETECTED NONE DETECTED   Phencyclidine (PCP) Ur S NONE DETECTED NONE DETECTED   Cannabinoid 50 Ng, Ur [place] NONE DETECTED NONE DETECTED   Barbiturates, Ur Screen NONE DETECTED NONE DETECTED   Benzodiazepine, Ur Scrn NONE DETECTED NONE DETECTED   Methadone Scn, Ur NONE DETECTED NONE DETECTED    Comment: (NOTE) 076  Tricyclics, urine  Cutoff 1000 ng/mL 200  Amphetamines, urine             Cutoff 1000 ng/mL 300  MDMA (Ecstasy), urine           Cutoff 500 ng/mL 400  Cocaine Metabolite, urine       Cutoff 300 ng/mL 500  Opiate, urine                   Cutoff 300 ng/mL 600  Phencyclidine (PCP), urine      Cutoff 25 ng/mL 700  Cannabinoid, urine              Cutoff 50 ng/mL 800  Barbiturates, urine             Cutoff 200 ng/mL 900  Benzodiazepine, urine           Cutoff 200 ng/mL 1000 Methadone, urine                Cutoff 300 ng/mL 1100 1200 The urine drug screen provides only a preliminary, unconfirmed 1300 analytical test result and should not be used for non-medical 1400 purposes. Clinical consideration and professional judgment should 1500 be applied to any positive drug screen result due to possible 1600 interfering substances. A more specific alternate chemical method 1700 must be used in order to obtain a confirmed analytical result.  1800 Gas chromato graphy / mass spectrometry (GC/MS) is the preferred 1900 confirmatory method.   Troponin I     Status: None   Collection Time: 10/11/16  1:36 AM  Result Value Ref Range   Troponin I <0.03 <0.03 ng/mL  Troponin I (q 6hr x 3)     Status: None   Collection Time: 10/11/16  8:33 AM  Result Value Ref Range   Troponin I <0.03 <0.03 ng/mL  Basic metabolic panel     Status: Abnormal   Collection Time: 10/14/16 12:18 AM  Result Value Ref Range   Sodium 139 135 - 145 mmol/L   Potassium 4.0 3.5 - 5.1 mmol/L   Chloride 110 101  - 111 mmol/L   CO2 23 22 - 32 mmol/L   Glucose, Bld 141 (H) 65 - 99 mg/dL   BUN 12 6 - 20 mg/dL   Creatinine, Ser 2.69 0.44 - 1.00 mg/dL   Calcium 8.2 (L) 8.9 - 10.3 mg/dL   GFR calc non Af Amer >60 >60 mL/min   GFR calc Af Amer >60 >60 mL/min    Comment: (NOTE) The eGFR has been calculated using the CKD EPI equation. This calculation has not been validated in all clinical situations. eGFR's persistently <60 mL/min signify possible Chronic Kidney Disease.    Anion gap 6 5 - 15  CBC     Status: Abnormal   Collection Time: 10/14/16 12:18 AM  Result Value Ref Range   WBC 11.1 (H) 3.6 - 11.0 K/uL   RBC 3.89 3.80 - 5.20 MIL/uL   Hemoglobin 10.3 (L) 12.0 - 16.0 g/dL   HCT 23.9 (L) 07.4 - 97.3 %   MCV 80.6 80.0 - 100.0 fL   MCH 26.5 26.0 - 34.0 pg   MCHC 32.8 32.0 - 36.0 g/dL   RDW 66.6 (H) 59.6 - 60.7 %   Platelets 312 150 - 440 K/uL  Troponin I     Status: None   Collection Time: 10/14/16 12:18 AM  Result Value Ref Range   Troponin I <0.03 <0.03 ng/mL  CBC with Differential     Status: Abnormal   Collection  Time: 10/15/16 12:19 AM  Result Value Ref Range   WBC 11.6 (H) 3.6 - 11.0 K/uL   RBC 3.91 3.80 - 5.20 MIL/uL   Hemoglobin 10.3 (L) 12.0 - 16.0 g/dL   HCT 31.9 (L) 35.0 - 47.0 %   MCV 81.5 80.0 - 100.0 fL   MCH 26.4 26.0 - 34.0 pg   MCHC 32.4 32.0 - 36.0 g/dL   RDW 15.3 (H) 11.5 - 14.5 %   Platelets 289 150 - 440 K/uL   Neutrophils Relative % 70 %   Neutro Abs 8.1 (H) 1.4 - 6.5 K/uL   Lymphocytes Relative 19 %   Lymphs Abs 2.3 1.0 - 3.6 K/uL   Monocytes Relative 7 %   Monocytes Absolute 0.8 0.2 - 0.9 K/uL   Eosinophils Relative 3 %   Eosinophils Absolute 0.3 0 - 0.7 K/uL   Basophils Relative 1 %   Basophils Absolute 0.1 0 - 0.1 K/uL  Comprehensive metabolic panel     Status: Abnormal   Collection Time: 10/15/16 12:19 AM  Result Value Ref Range   Sodium 139 135 - 145 mmol/L   Potassium 3.9 3.5 - 5.1 mmol/L   Chloride 112 (H) 101 - 111 mmol/L   CO2 22 22 - 32  mmol/L   Glucose, Bld 110 (H) 65 - 99 mg/dL   BUN 12 6 - 20 mg/dL   Creatinine, Ser 0.66 0.44 - 1.00 mg/dL   Calcium 8.2 (L) 8.9 - 10.3 mg/dL   Total Protein 6.9 6.5 - 8.1 g/dL   Albumin 2.6 (L) 3.5 - 5.0 g/dL   AST 33 15 - 41 U/L   ALT 47 14 - 54 U/L   Alkaline Phosphatase 98 38 - 126 U/L   Total Bilirubin 0.1 (L) 0.3 - 1.2 mg/dL   GFR calc non Af Amer >60 >60 mL/min   GFR calc Af Amer >60 >60 mL/min    Comment: (NOTE) The eGFR has been calculated using the CKD EPI equation. This calculation has not been validated in all clinical situations. eGFR's persistently <60 mL/min signify possible Chronic Kidney Disease.    Anion gap 5 5 - 15  Troponin I     Status: None   Collection Time: 10/15/16 12:19 AM  Result Value Ref Range   Troponin I <0.03 <0.03 ng/mL  TSH     Status: None   Collection Time: 10/15/16 12:19 AM  Result Value Ref Range   TSH 1.444 0.350 - 4.500 uIU/mL    Comment: Performed by a 3rd Generation assay with a functional sensitivity of <=0.01 uIU/mL.  T4, free     Status: None   Collection Time: 10/15/16 12:19 AM  Result Value Ref Range   Free T4 0.67 0.61 - 1.12 ng/dL    Comment: (NOTE) Biotin ingestion may interfere with free T4 tests. If the results are inconsistent with the TSH level, previous test results, or the clinical presentation, then consider biotin interference. If needed, order repeat testing after stopping biotin.   Uric acid     Status: None   Collection Time: 10/15/16 12:19 AM  Result Value Ref Range   Uric Acid, Serum 3.6 2.3 - 6.6 mg/dL  Lactate dehydrogenase     Status: None   Collection Time: 10/15/16 12:19 AM  Result Value Ref Range   LDH 146 98 - 192 U/L  Urinalysis, Complete w Microscopic     Status: Abnormal   Collection Time: 10/15/16  2:54 AM  Result Value Ref Range  Color, Urine STRAW (A) YELLOW   APPearance CLEAR (A) CLEAR   Specific Gravity, Urine 1.011 1.005 - 1.030   pH 6.0 5.0 - 8.0   Glucose, UA NEGATIVE NEGATIVE  mg/dL   Hgb urine dipstick NEGATIVE NEGATIVE   Bilirubin Urine NEGATIVE NEGATIVE   Ketones, ur NEGATIVE NEGATIVE mg/dL   Protein, ur NEGATIVE NEGATIVE mg/dL   Nitrite NEGATIVE NEGATIVE   Leukocytes, UA NEGATIVE NEGATIVE   RBC / HPF 0-5 0 - 5 RBC/hpf   WBC, UA 0-5 0 - 5 WBC/hpf   Bacteria, UA NONE SEEN NONE SEEN   Squamous Epithelial / LPF 0-5 (A) NONE SEEN   Mucous PRESENT   Protein / creatinine ratio, urine     Status: None   Collection Time: 10/15/16  2:54 AM  Result Value Ref Range   Creatinine, Urine 48 mg/dL   Total Protein, Urine <6 mg/dL    Comment: NO NORMAL RANGE ESTABLISHED FOR THIS TEST   Protein Creatinine Ratio        0.00 - 0.15 mg/mg[Cre]    Comment: RESULT BELOW REPORTABLE RANGE, UNABLE TO CALCULATE.   Urine Drug Screen, Qualitative ([facility] only)     Status: Abnormal   Collection Time: 10/15/16  2:54 AM  Result Value Ref Range   Tricyclic, Ur Screen NONE DETECTED NONE DETECTED   Amphetamines, Ur Screen NONE DETECTED NONE DETECTED   MDMA (Ecstasy)Ur Screen NONE DETECTED NONE DETECTED   Cocaine Metabolite,Ur [place] NONE DETECTED NONE DETECTED   Opiate, Ur Screen NONE DETECTED NONE DETECTED   Phencyclidine (PCP) Ur S NONE DETECTED NONE DETECTED   Cannabinoid 50 Ng, Ur [place] NONE DETECTED NONE DETECTED   Barbiturates, Ur Screen POSITIVE (A) NONE DETECTED   Benzodiazepine, Ur Scrn NONE DETECTED NONE DETECTED   Methadone Scn, Ur NONE DETECTED NONE DETECTED    Comment: (NOTE) 993  Tricyclics, urine               Cutoff 1000 ng/mL 200  Amphetamines, urine             Cutoff 1000 ng/mL 300  MDMA (Ecstasy), urine           Cutoff 500 ng/mL 400  Cocaine Metabolite, urine       Cutoff 300 ng/mL 500  Opiate, urine                   Cutoff 300 ng/mL 600  Phencyclidine (PCP), urine      Cutoff 25 ng/mL 700  Cannabinoid, urine              Cutoff 50 ng/mL 800  Barbiturates, urine             Cutoff 200 ng/mL 900  Benzodiazepine, urine           Cutoff 200 ng/mL 1000 Methadone,  urine                Cutoff 300 ng/mL 1100 1200 The urine drug screen provides only a preliminary, unconfirmed 1300 analytical test result and should not be used for non-medical 1400 purposes. Clinical consideration and professional judgment should 1500 be applied to any positive drug screen result due to possible 1600 interfering substances. A more specific alternate chemical method 1700 must be used in order to obtain a confirmed analytical result.  1800 Gas chromato graphy / mass spectrometry (GC/MS) is the preferred 1900 confirmatory method.     Procedures: NST  Discharge Condition: good  [facility]  Diet: Regular  diet  Discharge Activity: Activity as tolerated  Discharge Instructions    Fetal Kick Count:  Lie on our left side for one hour after a meal, and count the number of times your baby kicks.  If it is less than 5 times, get up, move around and drink some juice.  Repeat the test 30 minutes later.  If it is still less than 5 kicks in an hour, notify your doctor.    Complete by:  As directed    LABOR:  When conractions begin, you should start to time them from the beginning of one contraction to the beginning  of the next.  When contractions are 5 - 10 minutes apart or less and have been regular for at least an hour, you should call your health care provider.    Complete by:  As directed    Notify physician for bleeding from the vagina    Complete by:  As directed    Notify physician for blurring of vision or spots before the eyes    Complete by:  As directed    Notify physician for chills or fever    Complete by:  As directed    Notify physician for fainting spells, "black outs" or loss of consciousness    Complete by:  As directed    Notify physician for increase in vaginal discharge    Complete by:  As directed    Notify physician for leaking of fluid    Complete by:  As directed    Notify physician for pain or burning when urinating     Complete by:  As directed    Notify physician for pelvic pressure (sudden increase)    Complete by:  As directed    Notify physician for severe or continued nausea or vomiting    Complete by:  As directed    Notify physician for sudden gushing of fluid from the vagina (with or without continued leaking)    Complete by:  As directed    Notify physician for sudden, constant, or occasional abdominal pain    Complete by:  As directed    Notify physician if baby moving less than usual    Complete by:  As directed      Allergies as of 10/15/2016      Reactions   Nucynta [tapentadol] Hives      Medication List    TAKE these medications   acetaminophen 325 MG tablet Commonly known as:  TYLENOL Take 650 mg by mouth every 6 (six) hours as needed.   aspirin EC 81 MG tablet Take 81 mg by mouth daily.   esomeprazole 40 MG capsule Commonly known as:  NEXIUM Take 40 mg by mouth daily at 12 noon.   multivitamin-prenatal 27-0.8 MG Tabs tablet Take 1 tablet by mouth daily at 12 noon.        Total time spent taking care of this patient: 60 minutes  Signed: Malachy Mood M 10/18/2016, 9:22 AM

## 2016-10-24 ENCOUNTER — Other Ambulatory Visit: Payer: Medicaid Other

## 2016-11-02 ENCOUNTER — Other Ambulatory Visit: Payer: Medicaid Other

## 2016-11-07 ENCOUNTER — Other Ambulatory Visit: Payer: Medicaid Other

## 2016-11-08 ENCOUNTER — Inpatient Hospital Stay
Admission: RE | Admit: 2016-11-08 | Payer: Medicaid Other | Source: Ambulatory Visit | Admitting: Obstetrics and Gynecology

## 2016-11-08 ENCOUNTER — Encounter: Admission: RE | Payer: Self-pay | Source: Ambulatory Visit

## 2016-11-08 SURGERY — Surgical Case
Anesthesia: Choice

## 2016-11-21 ENCOUNTER — Other Ambulatory Visit: Payer: Self-pay | Admitting: Certified Nurse Midwife

## 2017-01-25 ENCOUNTER — Emergency Department
Admission: EM | Admit: 2017-01-25 | Discharge: 2017-01-25 | Disposition: A | Discharge status: Home / Self Care | Payer: Medicaid Other | Attending: Emergency Medicine | Admitting: Emergency Medicine

## 2017-01-25 ENCOUNTER — Emergency Department: Payer: Medicaid Other

## 2017-01-25 DIAGNOSIS — Z79899 Other long term (current) drug therapy: Secondary | ICD-10-CM | POA: Diagnosis not present

## 2017-01-25 DIAGNOSIS — R0789 Other chest pain: Secondary | ICD-10-CM | POA: Diagnosis not present

## 2017-01-25 DIAGNOSIS — I10 Essential (primary) hypertension: Secondary | ICD-10-CM | POA: Insufficient documentation

## 2017-01-25 DIAGNOSIS — Z87891 Personal history of nicotine dependence: Secondary | ICD-10-CM | POA: Diagnosis not present

## 2017-01-25 DIAGNOSIS — R079 Chest pain, unspecified: Secondary | ICD-10-CM

## 2017-01-25 DIAGNOSIS — J45909 Unspecified asthma, uncomplicated: Secondary | ICD-10-CM | POA: Insufficient documentation

## 2017-01-25 LAB — CBC
HEMATOCRIT: 35.7 % (ref 35.0–47.0)
HEMOGLOBIN: 11.5 g/dL — AB (ref 12.0–16.0)
MCH: 25.7 pg — AB (ref 26.0–34.0)
MCHC: 32.2 g/dL (ref 32.0–36.0)
MCV: 79.7 fL — ABNORMAL LOW (ref 80.0–100.0)
Platelets: 415 10*3/uL (ref 150–440)
RBC: 4.48 MIL/uL (ref 3.80–5.20)
RDW: 16 % — ABNORMAL HIGH (ref 11.5–14.5)
WBC: 9.2 10*3/uL (ref 3.6–11.0)

## 2017-01-25 LAB — BASIC METABOLIC PANEL
ANION GAP: 7 (ref 5–15)
BUN: 13 mg/dL (ref 6–20)
CALCIUM: 8.8 mg/dL — AB (ref 8.9–10.3)
CO2: 27 mmol/L (ref 22–32)
Chloride: 105 mmol/L (ref 101–111)
Creatinine, Ser: 0.6 mg/dL (ref 0.44–1.00)
GFR calc non Af Amer: >60 mL/min (ref >60)
Glucose, Bld: 109 mg/dL — ABNORMAL HIGH (ref 65–99)
Potassium: 3.6 mmol/L (ref 3.5–5.1)
Sodium: 139 mmol/L (ref 135–145)

## 2017-01-25 LAB — TROPONIN I

## 2017-01-25 NOTE — ED Triage Notes (Signed)
Pt presents with c/o of chest pain radiating to left arm along with SOB, N/V, dizziness and back pain. Pt states this all began about 20 mins ago. Pt NAD at  This time. Respirations even and unlabored.

## 2017-01-25 NOTE — ED Provider Notes (Signed)
[facility] Emergency Department Provider Note  ____________________________________________   I have reviewed the triage vital signs and the nursing notes.   HISTORY  Chief Complaint Chest Pain    HPI Lauren [name] is a 41 y.o. female who states she has had chest pain every other day since 2011 sometimes every day. It's always on the left side always radiates to the left arm. She had a CT scan with a pulmonary embolism dye load in the last few months, she has had a negative echocardiogram within the last 6 months, negative Holter monitor, she is seen multiple different cardiologists for this over the last 7 years including here and other facilities. She has been told she has noncardiac chest pain. Patient states the pain happens when she is anxious and upset. She states that she was having verbal altercation with her partner she became very stressed and her chest pain started at that time. It's the same chest pain she has a nearly daily basis and has for years. Left-sided chest discomfort going down her left arm. She has no pleuritic chest pain no recent travel no leg swelling no personal or family history of PE or DVT and she states she is not taking any estrogens. Patient states she has not had any recent surgeries, she denies any fever or chills. The pain is worse when she touches it or changes position. She states that she calms down becomes less anxious, the pain gets better. She also has a history of SVT. She states she was not an SVT today. She states it is possible that she has anxiety attacks. She does state that she is very anxious and has life stressors she denies SI or HI and she denies being abused.    Past Medical History:  Diagnosis Date  . Advanced maternal age in multigravida   . Asthma   . Depression   . GERD (gastroesophageal reflux disease)   . [place] multiparity   . Headache   . Hypertension   . Leg fracture   . Obesity, morbid, BMI 40.0-49.9  ([place])   . Post traumatic stress disorder   . Pre-eclampsia   . Tachycardia   . Wrist fracture, closed    bilateral    Patient Active Problem List   Diagnosis Date Noted  . SVT (supraventricular tachycardia) ([place]) 10/15/2016  . Elevated blood pressure affecting pregnancy in third trimester, antepartum 10/10/2016  . Chest pain 09/23/2016  . Hypertension complicating pregnancy 10/93/2355  . Obesity 05/30/2016  . Advanced maternal age in multigravida 05/30/2016  . Depression affecting pregnancy in second trimester, antepartum 05/30/2016    Past Surgical History:  Procedure Laterality Date  . CESAREAN SECTION  2010  . CESAREAN SECTION  2011  . CHOLECYSTECTOMY    . KNEE SURGERY    . ROTATOR CUFF REPAIR    . TONSILLECTOMY      Prior to Admission medications   Medication Sig Start Date End Date Taking? Authorizing Provider  acetaminophen (TYLENOL) 325 MG tablet Take 650 mg by mouth every 6 (six) hours as needed.    [provider]  aspirin EC 81 MG tablet Take 81 mg by mouth daily.    [provider]  esomeprazole (NEXIUM) 40 MG capsule Take 40 mg by mouth daily at 12 noon.    [provider]  Prenat w/o A-FeCbGl-DSS-FA-DHA (CITRANATAL ASSURE) 35-1 & 300 MG tablet TAKE 1 TABLET AND TAKE 1 SOFTGEL DAILY 11/22/16   Dalia Heading, CNM  Prenatal Vit-Fe  Fumarate-FA (MULTIVITAMIN-PRENATAL) 27-0.8 MG TABS tablet Take 1 tablet by mouth daily at 12 noon.    [provider]    Allergies Nucynta [tapentadol]  Family History  Problem Relation Age of Onset  . Diabetes Sister   . Migraines Maternal Aunt   . Migraines Maternal Uncle   . Migraines Paternal Aunt   . Migraines Paternal Uncle   . Diabetes Sister   . Diabetes Sister   . Diabetes Sister   . Diabetes Sister   . Diabetes Sister   . Ovarian cancer Cousin   . Diabetes Mother   . Diabetes Father   . Migraines Father     Social History Social History  Substance Use Topics  . Smoking  status: Former Smoker    Types: Cigarettes  . Smokeless tobacco: Never Used     Comment: occasional smoker  . Alcohol use No    Review of Systems Constitutional: No fever/chills Eyes: No visual changes. ENT: No sore throat. No stiff neck no neck pain Cardiovascular: Positive chest pain. Respiratory: Denies shortness of breath. Gastrointestinal:   no vomiting.  No diarrhea.  No constipation. Genitourinary: Negative for dysuria. Musculoskeletal: Negative lower extremity swelling Skin: Negative for rash. Neurological: Negative for severe headaches, focal weakness or numbness. 10-point ROS otherwise negative.  ____________________________________________   PHYSICAL EXAM:  VITAL SIGNS: ED Triage Vitals  Enc Vitals Group     BP 01/25/17 1859 127/72     Pulse Rate 01/25/17 1859 (!) 16     Resp 01/25/17 1859 19     Temp 01/25/17 1859 97.7 F (36.5 C)     Temp Source 01/25/17 1859 Oral     SpO2 01/25/17 1859 100 %     Weight 01/25/17 1900 (!) 320 lb (145.2 kg)     Height 01/25/17 1900 5\' 5"  (1.651 m)     Head Circumference --      Peak Flow --      Pain Score 01/25/17 1859 8     Pain Loc --      Pain Edu? --      Excl. in [place]? --     Constitutional: Alert and oriented. Well appearing and in no acute distress. Eyes: Conjunctivae are normal. PERRL. EOMI. Head: Atraumatic. Nose: No congestion/rhinnorhea. Mouth/Throat: Mucous membranes are moist.  Oropharynx non-erythematous. Neck: No stridor.   Nontender with no meningismus Chest: Tender to palpation the left chest wall, there is no crepitus no flail chest, went ptosis area patient states "ouch that's the pain right there". Female chaperone present for this exam. Patient has no evidence of mass or induration or cellulitis. Cardiovascular: Normal rate, regular rhythm. Grossly normal heart sounds.  Good peripheral circulation. Respiratory: Normal respiratory effort.  No retractions. Lungs CTAB. Abdominal: Soft and nontender. No  distention. No guarding no rebound Back:  There is no focal tenderness or step off.  there is no midline tenderness there are no lesions noted. there is no CVA tenderness Musculoskeletal: No lower extremity tenderness, no upper extremity tenderness. No joint effusions, no DVT signs strong distal pulses no edema Neurologic:  Normal speech and language. No gross focal neurologic deficits are appreciated.  Skin:  Skin is warm, dry and intact. No rash noted. Psychiatric: Mood and affect are very anxious. Speech and behavior are normal.  ____________________________________________   LABS (all labs ordered are listed, but only abnormal results are displayed)  Labs Reviewed  BASIC METABOLIC PANEL - Abnormal; Notable for the following:  Result Value   Glucose, Bld 109 (*)    Calcium 8.8 (*)    All other components within normal limits  CBC - Abnormal; Notable for the following:    Hemoglobin 11.5 (*)    MCV 79.7 (*)    MCH 25.7 (*)    RDW 16.0 (*)    All other components within normal limits  TROPONIN I  TROPONIN I   ____________________________________________  EKG  I personally interpreted any EKGs ordered by me or triage Sinus tach rate 103, normal axis no acute ST elevation or depression, ____________________________________________  RADIOLOGY  I reviewed any imaging ordered by me or triage that were performed during my shift and, if possible, patient and/or family made aware of any abnormal findings. ____________________________________________   PROCEDURES  Procedure(s) performed: None  Procedures  Critical Care performed: None  ____________________________________________   INITIAL IMPRESSION / ASSESSMENT AND PLAN / ED COURSE  Pertinent labs & imaging results that were available during my care of the patient were reviewed by me and considered in my medical decision making (see chart for details).  Patient here with very reproducible recurrent chest wall  pain. She has had extensive workup for this in the past. Patient is starting to relax now that I have talked to her and she states her pain is getting better. I find it difficult to imagine that the patient has had a recurrent heart attack every other day since 2011. 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 She has reproduce will chest wall pain that seems to be brought on primarily by anxiety and in this case it was brought on by anxiety tonight. While she was in active with her significant other, who she states is not physically or verbally abusive. Initial troponin is negative. We will resend a second troponin is a precaution patient has never had a heart catheter that we can determine, her EKG is reassuring however and it is my hope that with 2 troponins are negative we can get her home. I have low suspicion at this time for ACS or PE or dissection area patient is morbidly obese but again has had extensive workup for all of these entities in the past. Nothing to suggest myocarditis endocarditis pericarditis pneumothorax pneumonia pericardial effusion or any other significant thoracic pathology at this time    ____________________________________________   FINAL CLINICAL IMPRESSION(S) / ED DIAGNOSES  Final diagnoses:  None      This chart was dictated using voice recognition software.  Despite best efforts to proofread,  errors can occur which can change meaning.      Schuyler Amor, MD 01/25/17 2016

## 2017-01-25 NOTE — ED Notes (Addendum)
Only one EKG performed on pt

## 2017-01-27 ENCOUNTER — Encounter: Payer: Self-pay | Admitting: Emergency Medicine

## 2017-01-27 ENCOUNTER — Ambulatory Visit
Admission: EM | Admit: 2017-01-27 | Discharge: 2017-01-27 | Disposition: A | Discharge status: Home / Self Care | Payer: Medicaid Other | Attending: Emergency Medicine | Admitting: Emergency Medicine

## 2017-01-27 DIAGNOSIS — M62838 Other muscle spasm: Secondary | ICD-10-CM

## 2017-01-27 MED ORDER — NAPROXEN 500 MG PO TABS: 500.0000 mg | ORAL_TABLET | Freq: Two times a day (BID) | ORAL | 0 refills | Status: DC

## 2017-01-27 MED ORDER — TIZANIDINE HCL 4 MG PO CAPS: 4.0000 mg | ORAL_CAPSULE | Freq: Three times a day (TID) | ORAL | 0 refills | Status: DC

## 2017-01-27 MED ORDER — KETOROLAC TROMETHAMINE 60 MG/2ML IM SOLN
60.0000 mg | Freq: Once | INTRAMUSCULAR | Status: AC
  Administered 2017-01-27: 60 mg via INTRAMUSCULAR

## 2017-01-27 NOTE — ED Provider Notes (Signed)
CSN: [id number]     Arrival date & time 01/27/17  1933 History   First MD Initiated Contact with Patient 01/27/17 2054     Chief Complaint  Patient presents with  . Neck Pain   (Consider location/radiation/quality/duration/timing/severity/associated sxs/prior Treatment) HPI  This is a 41 year old female presents with neck pain radiation to her left arm with tingling and numbness in her left hand in a nondermatomal pattern. This pain for about 3 days. 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 She states that she is better if she presses on the back of her neck gives her some relief and worse is with any motion of her head. Been using any over-the-counter medication that she has available and has found that has not been helpful.       Past Medical History:  Diagnosis Date  . Advanced maternal age in multigravida   . Asthma   . Depression   . GERD (gastroesophageal reflux disease)   . [place] multiparity   . Headache   . Hypertension   . Leg fracture   . Obesity, morbid, BMI 40.0-49.9 ([place])   . Post traumatic stress disorder   . Pre-eclampsia   . Tachycardia   . Wrist fracture, closed    bilateral   Past Surgical History:  Procedure Laterality Date  . CESAREAN SECTION  2010  . CESAREAN SECTION  2011  . CHOLECYSTECTOMY    . KNEE SURGERY    . ROTATOR CUFF REPAIR    . TONSILLECTOMY     Family History  Problem Relation Age of Onset  . Diabetes Sister   . Migraines Maternal Aunt   . Migraines Maternal Uncle   . Migraines Paternal Aunt   . Migraines Paternal Uncle   . Diabetes Sister   . Diabetes Sister   . Diabetes Sister   . Diabetes Sister   . Diabetes Sister   . Ovarian cancer Cousin   . Diabetes Mother   . Diabetes Father   . Migraines Father    Social History  Substance Use Topics  . Smoking status: Former Smoker    Types: Cigarettes  . Smokeless tobacco: Never Used      Comment: occasional smoker  . Alcohol use No   OB History    Gravida Para Term Preterm AB Living   11 7 7  0 3 7   SAB TAB Ectopic Multiple Live Births   2   1   2      Review of Systems  Constitutional: Positive for activity change. Negative for appetite change, chills, fatigue and fever.  Musculoskeletal: Positive for neck pain and neck stiffness.  All other systems reviewed and are negative.   Allergies  Nucynta [tapentadol]  Home Medications   Prior to Admission medications   Medication Sig Start Date End Date Taking? Authorizing Provider  acetaminophen (TYLENOL) 325 MG tablet Take 650 mg by mouth every 6 (six) hours as needed.   Yes [provider]  aspirin EC 81 MG tablet Take 81 mg by mouth daily.   Yes [provider]  esomeprazole (NEXIUM) 40 MG capsule Take 40 mg by mouth daily at 12 noon.   Yes [provider]  Prenat w/o A-FeCbGl-DSS-FA-DHA (CITRANATAL ASSURE) 35-1 & 300 MG tablet TAKE 1 TABLET AND TAKE 1 SOFTGEL DAILY 11/22/16  Yes Dalia Heading, CNM  Prenatal Vit-Fe Fumarate-FA (MULTIVITAMIN-PRENATAL) 27-0.8 MG TABS tablet Take 1 tablet  by mouth daily at 12 noon.   Yes [provider]  naproxen (NAPROSYN) 500 MG tablet Take 1 tablet (500 mg total) by mouth 2 (two) times daily with a meal. 01/27/17   Lorin Picket, PA-C  tiZANidine (ZANAFLEX) 4 MG capsule Take 1 capsule (4 mg total) by mouth 3 (three) times daily. 01/27/17   Lorin Picket, PA-C   Meds Ordered and Administered this Visit   Medications  ketorolac (TORADOL) injection 60 mg (60 mg Intramuscular Given 01/27/17 2106)    BP 128/66 (BP Location: Left Arm)   Pulse 87   Temp 98.4 F (36.9 C) (Oral)   Resp 16   Ht 5\' 5"  (1.651 m)   Wt (!) 320 lb (145.2 kg)   LMP 01/16/2017 (LMP Unknown)   SpO2 98%   BMI 53.25 kg/m  No data found.   Physical Exam  Constitutional: She appears well-developed and well-nourished. No distress.  HENT:  Head: Normocephalic.   Eyes: Pupils are equal, round, and reactive to light. Right eye exhibits no discharge. Left eye exhibits no discharge.  Neck:  Examination of the cervical spine is decreased range of motion in all quadrants with the discomfort at the extremes. She is tender on both trapezii. Upper extremity sensation shows a hip esthesia in a nondermatomal pattern throughout the entire left arm.Strength is intact to clinical testing. The TR's are intact and symmetrical  Skin: She is not diaphoretic.  Nursing note and vitals reviewed.   Urgent Care Course     Procedures (including critical care time)  Labs Review Labs Reviewed - No data to display  Imaging Review No results found.   Visual Acuity Review  Right Eye Distance:   Left Eye Distance:   Bilateral Distance:    Right Eye Near:   Left Eye Near:    Bilateral Near:     Medications  ketorolac (TORADOL) injection 60 mg (60 mg Intramuscular Given 01/27/17 2106)      MDM   1. Muscle spasms of neck    Current Discharge Medication List    START taking these medications   Details  naproxen (NAPROSYN) 500 MG tablet Take 1 tablet (500 mg total) by mouth 2 (two) times daily with a meal. Qty: 60 tablet, Refills: 0    tiZANidine (ZANAFLEX) 4 MG capsule Take 1 capsule (4 mg total) by mouth 3 (three) times daily. Qty: 21 capsule, Refills: 0      Plan: 1. Test/x-ray results and diagnosis reviewed with patient 2. rx as per orders; risks, benefits, potential side effects reviewed with patient 3. Recommend supportive treatment with Rest and symptom avoidance. Use ice 20 minutes out of every 2 hours 4 times daily as necessary. Take Naprosyn with food. Follow with primary care physician. She was given the name of [facility] since she lives in that area. She will need to schedule an appointment. She states that she is not breast-feeding. 4. F/u prn if symptoms worsen or don't improve     Lorin Picket, PA-C 01/27/17  2116

## 2017-01-27 NOTE — ED Triage Notes (Signed)
Neck pain that radiated down left arm and tingling and numbness in hand for 3 days

## 2017-03-02 ENCOUNTER — Ambulatory Visit: Payer: Medicaid Other | Admitting: Nurse Practitioner

## 2017-03-02 ENCOUNTER — Encounter: Payer: Self-pay | Admitting: Nurse Practitioner

## 2017-03-03 ENCOUNTER — Emergency Department
Admission: EM | Admit: 2017-03-03 | Discharge: 2017-03-03 | Disposition: A | Discharge status: Home / Self Care | Payer: Self-pay | Attending: Student in an Organized Health Care Education/Training Program | Admitting: Student in an Organized Health Care Education/Training Program

## 2017-03-03 ENCOUNTER — Emergency Department: Payer: Self-pay

## 2017-03-03 ENCOUNTER — Encounter: Payer: Self-pay | Admitting: Emergency Medicine

## 2017-03-03 DIAGNOSIS — Z3A08 8 weeks gestation of pregnancy: Secondary | ICD-10-CM | POA: Insufficient documentation

## 2017-03-03 DIAGNOSIS — I10 Essential (primary) hypertension: Secondary | ICD-10-CM | POA: Insufficient documentation

## 2017-03-03 DIAGNOSIS — S1096XA Insect bite of unspecified part of neck, initial encounter: Secondary | ICD-10-CM | POA: Insufficient documentation

## 2017-03-03 DIAGNOSIS — L509 Urticaria, unspecified: Secondary | ICD-10-CM | POA: Insufficient documentation

## 2017-03-03 DIAGNOSIS — Z79899 Other long term (current) drug therapy: Secondary | ICD-10-CM | POA: Insufficient documentation

## 2017-03-03 DIAGNOSIS — R300 Dysuria: Secondary | ICD-10-CM | POA: Insufficient documentation

## 2017-03-03 DIAGNOSIS — S20369A Insect bite (nonvenomous) of unspecified front wall of thorax, initial encounter: Secondary | ICD-10-CM | POA: Insufficient documentation

## 2017-03-03 DIAGNOSIS — R109 Unspecified abdominal pain: Secondary | ICD-10-CM | POA: Insufficient documentation

## 2017-03-03 DIAGNOSIS — Z9049 Acquired absence of other specified parts of digestive tract: Secondary | ICD-10-CM | POA: Insufficient documentation

## 2017-03-03 DIAGNOSIS — Z3201 Encounter for pregnancy test, result positive: Secondary | ICD-10-CM | POA: Insufficient documentation

## 2017-03-03 DIAGNOSIS — Y999 Unspecified external cause status: Secondary | ICD-10-CM | POA: Insufficient documentation

## 2017-03-03 DIAGNOSIS — O26899 Other specified pregnancy related conditions, unspecified trimester: Secondary | ICD-10-CM

## 2017-03-03 DIAGNOSIS — Z87891 Personal history of nicotine dependence: Secondary | ICD-10-CM | POA: Insufficient documentation

## 2017-03-03 DIAGNOSIS — W57XXXA Bitten or stung by nonvenomous insect and other nonvenomous arthropods, initial encounter: Secondary | ICD-10-CM | POA: Insufficient documentation

## 2017-03-03 DIAGNOSIS — J45909 Unspecified asthma, uncomplicated: Secondary | ICD-10-CM | POA: Insufficient documentation

## 2017-03-03 DIAGNOSIS — Y929 Unspecified place or not applicable: Secondary | ICD-10-CM | POA: Insufficient documentation

## 2017-03-03 DIAGNOSIS — L508 Other urticaria: Secondary | ICD-10-CM

## 2017-03-03 DIAGNOSIS — Z3A01 Less than 8 weeks gestation of pregnancy: Secondary | ICD-10-CM

## 2017-03-03 DIAGNOSIS — Z7982 Long term (current) use of aspirin: Secondary | ICD-10-CM | POA: Insufficient documentation

## 2017-03-03 DIAGNOSIS — Y9389 Activity, other specified: Secondary | ICD-10-CM | POA: Insufficient documentation

## 2017-03-03 LAB — HCG, QUANTITATIVE, PREGNANCY: HCG, BETA CHAIN, QUANT, S: 31454 m[IU]/mL — AB (ref <5)

## 2017-03-03 LAB — URINALYSIS, ROUTINE W REFLEX MICROSCOPIC
BILIRUBIN URINE: NEGATIVE
Glucose, UA: NEGATIVE mg/dL
Hgb urine dipstick: NEGATIVE
Ketones, ur: NEGATIVE mg/dL
LEUKOCYTES UA: NEGATIVE
NITRITE: NEGATIVE
Protein, ur: NEGATIVE mg/dL
SPECIFIC GRAVITY, URINE: 1.025 (ref 1.005–1.030)
pH: 5 (ref 5.0–8.0)

## 2017-03-03 LAB — POC URINE PREG, ED: Preg Test, Ur: POSITIVE — AB

## 2017-03-03 MED ORDER — DIPHENHYDRAMINE HCL 25 MG PO CAPS
50.0000 mg | ORAL_CAPSULE | Freq: Once | ORAL | Status: AC
  Administered 2017-03-03: 50 mg via ORAL
  Filled 2017-03-03: qty 2

## 2017-03-03 NOTE — ED Notes (Signed)
Back from u/s  Awaiting results

## 2017-03-03 NOTE — ED Triage Notes (Signed)
Pt to ED with multiple complaints.  States took home pregnancy test a couple days ago and was positive, having lower mid abd cramping.  Also states when walking outside today "felt a bite" to her right neck and feeling swelling around neck.  Pt chest rise even and unlabored, no obvious swelling, voice clear, able to maintain secretions, O2 sat 100%.

## 2017-03-03 NOTE — Discharge Instructions (Signed)
Follow-up with your OB for prenatal care. Continue Benadryl over-the-counter as needed for itching related to insect bites.

## 2017-03-03 NOTE — ED Provider Notes (Signed)
[facility] Emergency Department Provider Note   ____________________________________________   I have reviewed the triage vital signs and the nursing notes.   HISTORY  Chief Complaint Possible Pregnancy and Insect Bite    HPI Lauren [name] is a 41 y.o. female presents with urticaria, erythema and small bites around the neck area. Patient reports some type of insect bites that occurred earlier today and since continues to itch and swell without improvement. She is not taking anything over-the-counter since the bites occurred. Patient also reports lower abdominal cramping without any vaginal discharge. Patient believes she is pregnant she took a home pregnancy test came back positive. Patient has not had a cycle since giving birth in February. Patient reports recently giving birth in February, LMP unknown. Patient is concerned for ectopic pregnancy. Patient also reports dysuria, interrupted urine flow and change in the odor of her urine. She believes she may have a urinary tract infection. Patient reports she only has one sexual partner and has been exclusive and she reports he is exclusive as well. She does have concern for sexual transmitted diseases and only reports are very remote history of bacterial vaginosis. Patient denies fever, chills, headache, vision changes, chest pain, chest tightness, shortness of breath, abdominal pain, nausea and vomiting.  Past Medical History:  Diagnosis Date  . Advanced maternal age in multigravida   . Asthma   . Depression   . GERD (gastroesophageal reflux disease)   . [place] multiparity   . Headache   . Hypertension   . Leg fracture   . Obesity, morbid, BMI 40.0-49.9 ([place])   . Post traumatic stress disorder   . Pre-eclampsia   . SVT (supraventricular tachycardia) ([place])    a. prior report of SVT req adenosine;  b. 2011 Holter @ [facility] reportedly showed "irregular heartbeat"; c. 05/2016 Holter: "basically unremarkable" per  Dr. Clayborn Bigness;  d. 07/2016 Echo: EF 50-55%. Nl RV fxn.  . Wrist fracture, closed    bilateral    Patient Active Problem List   Diagnosis Date Noted  . SVT (supraventricular tachycardia) ([place]) 10/15/2016  . Elevated blood pressure affecting pregnancy in third trimester, antepartum 10/10/2016  . Chest pain 09/23/2016  . Hypertension complicating pregnancy 95/18/8416  . Obesity 05/30/2016  . Advanced maternal age in multigravida 05/30/2016  . Depression affecting pregnancy in second trimester, antepartum 05/30/2016    Past Surgical History:  Procedure Laterality Date  . CESAREAN SECTION  2010  . CESAREAN SECTION  2011  . CHOLECYSTECTOMY    . KNEE SURGERY    . ROTATOR CUFF REPAIR    . TONSILLECTOMY      Prior to Admission medications   Medication Sig Start Date End Date Taking? Authorizing Provider  acetaminophen (TYLENOL) 325 MG tablet Take 650 mg by mouth every 6 (six) hours as needed.    [provider]  aspirin EC 81 MG tablet Take 81 mg by mouth daily.    [provider]  esomeprazole (NEXIUM) 40 MG capsule Take 40 mg by mouth daily at 12 noon.    [provider]  naproxen (NAPROSYN) 500 MG tablet Take 1 tablet (500 mg total) by mouth 2 (two) times daily with a meal. 01/27/17   Lorin Picket, PA-C  Prenat w/o A-FeCbGl-DSS-FA-DHA (CITRANATAL ASSURE) 35-1 & 300 MG tablet TAKE 1 TABLET AND TAKE 1 SOFTGEL DAILY 11/22/16   Dalia Heading, CNM  Prenatal Vit-Fe Fumarate-FA (MULTIVITAMIN-PRENATAL) 27-0.8 MG TABS tablet Take 1 tablet by mouth daily at 12 noon.  [provider]  tiZANidine (ZANAFLEX) 4 MG capsule Take 1 capsule (4 mg total) by mouth 3 (three) times daily. 01/27/17   Lorin Picket, PA-C    Allergies Nucynta [tapentadol]  Family History  Problem Relation Age of Onset  . Diabetes Sister   . Migraines Maternal Aunt   . Migraines Maternal Uncle   . Migraines Paternal Aunt   . Migraines Paternal Uncle   . Diabetes Sister     . Diabetes Sister   . Diabetes Sister   . Diabetes Sister   . Diabetes Sister   . Ovarian cancer Cousin   . Diabetes Mother   . Diabetes Father   . Migraines Father     Social History Social History  Substance Use Topics  . Smoking status: Former Smoker    Types: Cigarettes  . Smokeless tobacco: Never Used     Comment: occasional smoker  . Alcohol use No     Comment: occ.    Review of Systems Constitutional: Negative for fever/chills Eyes: No visual changes. ENT:  Negative for sore throat and very mild sense of difficulty swallowing Cardiovascular: Denies chest pain. Respiratory: Denies cough Denies shortness of breath. Gastrointestinal: No abdominal pain.  No nausea, vomiting, diarrhea. Genitourinary: Possible pregnancy, dysuria Musculoskeletal: Negative for back pain. Negative for generalized body aches. Skin:Positive for rash along the neck and upper chest region with urticaria. Neurological: Negative for headaches.  Negative focal weakness or numbness. ____________________________________________   PHYSICAL EXAM:  VITAL SIGNS: Patient Vitals for the past 24 hrs:  BP Temp Temp src Pulse Resp SpO2 Height Weight  03/03/17 1757 130/80 - - 90 20 100 % - -  03/03/17 1343 133/87 98.1 F (36.7 C) Oral (!) 109 - 100 % - -    Constitutional: Alert and oriented. Well appearing and in no acute distress.  Head: Normocephalic and atraumatic. Eyes: Conjunctivae are normal. PERRL.  Nose: No congestion/rhinorrhea Mouth/Throat: Mucous membranes are moist. Neck: Supple. Cardiovascular: Normal rate, regular rhythm. Normal distal pulses. Respiratory: Normal respiratory effort. No wheezes/rales/rhonchi. Lungs CTAB Gastrointestinal: Soft and nontender. No distention. Genitourinary: Negative vaginal discharge. Negative palpable pelvic pain. Musculoskeletal: Nontender with normal range of motion in all extremities. Neurologic: Normal speech and language. No gross focal neurologic  deficits are appreciated. Skin:  Skin is warm, dry and intact. Erythema with several areas of urticaria and in pruritus; along bilateral neck and upper chest and bilateral forearms secondary to likely insect bites. Psychiatric: Mood and affect are normal.  ____________________________________________   LABS (all labs ordered are listed, but only abnormal results are displayed)  Labs Reviewed  URINALYSIS, ROUTINE W REFLEX MICROSCOPIC - Abnormal; Notable for the following:       Result Value   Color, Urine YELLOW (*)    APPearance HAZY (*)    All other components within normal limits  HCG, QUANTITATIVE, PREGNANCY - Abnormal; Notable for the following:    hCG, Beta Chain, Quant, S 31,454 (*)    All other components within normal limits  POC URINE PREG, ED - Abnormal; Notable for the following:    Preg Test, Ur Positive (*)    All other components within normal limits   ____________________________________________  EKG None ____________________________________________  RADIOLOGY US OB Comp Less 14 weeks IMPRESSION: Single live intrauterine pregnancy noted, with a crown-rump length of 0.6 cm, corresponding to a gestational age of [redacted] weeks 3 days. This matches the gestational age of [redacted] weeks 3 days by LMP, reflecting an estimated date of delivery  of October 17, 2017.  US OB Transvaginal  IMPRESSION: Single live intrauterine pregnancy noted, with a crown-rump length of 0.6 cm, corresponding to a gestational age of [redacted] weeks 3 days. This matches the gestational age of [redacted] weeks 3 days by LMP, reflecting an estimated date of delivery of October 17, 2017. ____________________________________________   PROCEDURES  Procedure(s) performed: no    Critical Care performed: no ____________________________________________   INITIAL IMPRESSION / ASSESSMENT AND PLAN / ED COURSE  Pertinent labs & imaging results that were available during my care of the patient were reviewed by me and  considered in my medical decision making (see chart for details).  Patient presented with rash that developed secondary to insect bites along the neck upper chest and bilateral arms that resolved completely with oral Benadryl 50 mg. Patient noted significant improvement following Benadryl. Patient also presented with abdominal cramping and positive pregnancy test. Physical exam findings, labs and ultrasound results confirmed intrauterine pregnancy. Pregnancy estimated at 7 weeks and 3 days based on ultrasound readings. Abdominal cramping complaints at this time appear benign based on all results are revealing healthy progressing pregnancy. Patient is encouraged to begin her prenatal care with her OB. Vital signs at discharge were reassuring. Patient  informed of clinical course, understand medical decision-making process, and agree with plan.  Patient was advised to follow up with OB for prenatal care and was also advised to return to the emergency department for symptoms that change or worsen.      ____________________________________________   FINAL CLINICAL IMPRESSION(S) / ED DIAGNOSES  Final diagnoses:  Insect bite, initial encounter  Urticaria, acute  Less than [redacted] weeks gestation of pregnancy       NEW MEDICATIONS STARTED DURING THIS VISIT:  Discharge Medication List as of 03/03/2017  6:03 PM       Note:  This document was prepared using Dragon voice recognition software and may include unintentional dictation errors.    Rena Sweeden, Fleet Contras 03/03/17 1820    Merlyn Lot, MD 03/04/17 [phone]

## 2017-03-03 NOTE — ED Notes (Signed)
Urine sent to lab with save label 

## 2017-03-03 NOTE — ED Notes (Signed)
See triage note  States she had a positive preg test at home  But developed some supra pubic pain   Denies any vaginal bleeding   Also while in car felt some thing sting her on her neck  Area began to itch   Min redness and swelling noted to left side of neck

## 2017-05-21 ENCOUNTER — Emergency Department
Admission: EM | Admit: 2017-05-21 | Discharge: 2017-05-21 | Disposition: A | Discharge status: Home / Self Care | Payer: Medicaid Other | Attending: Emergency Medicine | Admitting: Emergency Medicine

## 2017-05-21 ENCOUNTER — Encounter: Payer: Self-pay | Admitting: Emergency Medicine

## 2017-05-21 ENCOUNTER — Other Ambulatory Visit: Payer: Self-pay | Admitting: Certified Nurse Midwife

## 2017-05-21 DIAGNOSIS — O9989 Other specified diseases and conditions complicating pregnancy, childbirth and the puerperium: Secondary | ICD-10-CM | POA: Insufficient documentation

## 2017-05-21 DIAGNOSIS — J45909 Unspecified asthma, uncomplicated: Secondary | ICD-10-CM | POA: Insufficient documentation

## 2017-05-21 DIAGNOSIS — Z3A16 16 weeks gestation of pregnancy: Secondary | ICD-10-CM | POA: Diagnosis not present

## 2017-05-21 DIAGNOSIS — O9952 Diseases of the respiratory system complicating childbirth: Secondary | ICD-10-CM | POA: Insufficient documentation

## 2017-05-21 DIAGNOSIS — Z79899 Other long term (current) drug therapy: Secondary | ICD-10-CM | POA: Insufficient documentation

## 2017-05-21 DIAGNOSIS — Z87891 Personal history of nicotine dependence: Secondary | ICD-10-CM | POA: Insufficient documentation

## 2017-05-21 DIAGNOSIS — H9202 Otalgia, left ear: Secondary | ICD-10-CM

## 2017-05-21 DIAGNOSIS — R1084 Generalized abdominal pain: Secondary | ICD-10-CM

## 2017-05-21 DIAGNOSIS — O10012 Pre-existing essential hypertension complicating pregnancy, second trimester: Secondary | ICD-10-CM | POA: Insufficient documentation

## 2017-05-21 LAB — POCT PREGNANCY, URINE: Preg Test, Ur: POSITIVE — AB

## 2017-05-21 LAB — URINALYSIS, COMPLETE (UACMP) WITH MICROSCOPIC
Bilirubin Urine: NEGATIVE
GLUCOSE, UA: NEGATIVE mg/dL
Hgb urine dipstick: NEGATIVE
KETONES UR: NEGATIVE mg/dL
Leukocytes, UA: NEGATIVE
Nitrite: NEGATIVE
PH: 5 (ref 5.0–8.0)
Protein, ur: NEGATIVE mg/dL
RBC / HPF: NONE SEEN RBC/hpf (ref 0–5)
SPECIFIC GRAVITY, URINE: 1.027 (ref 1.005–1.030)

## 2017-05-21 LAB — COMPREHENSIVE METABOLIC PANEL
ALK PHOS: 58 U/L (ref 38–126)
ALT: 16 U/L (ref 14–54)
AST: 20 U/L (ref 15–41)
Albumin: 2.9 g/dL — ABNORMAL LOW (ref 3.5–5.0)
Anion gap: 9 (ref 5–15)
BILIRUBIN TOTAL: 0.4 mg/dL (ref 0.3–1.2)
BUN: 10 mg/dL (ref 6–20)
CALCIUM: 8.9 mg/dL (ref 8.9–10.3)
CO2: 22 mmol/L (ref 22–32)
Chloride: 105 mmol/L (ref 101–111)
Creatinine, Ser: 0.61 mg/dL (ref 0.44–1.00)
Glucose, Bld: 107 mg/dL — ABNORMAL HIGH (ref 65–99)
Potassium: 3.6 mmol/L (ref 3.5–5.1)
Sodium: 136 mmol/L (ref 135–145)
TOTAL PROTEIN: 7.3 g/dL (ref 6.5–8.1)

## 2017-05-21 LAB — LIPASE, BLOOD: LIPASE: 15 U/L (ref 11–51)

## 2017-05-21 LAB — CBC
HCT: 33.6 % — ABNORMAL LOW (ref 35.0–47.0)
Hemoglobin: 11.2 g/dL — ABNORMAL LOW (ref 12.0–16.0)
MCH: 25.9 pg — AB (ref 26.0–34.0)
MCHC: 33.3 g/dL (ref 32.0–36.0)
MCV: 77.9 fL — ABNORMAL LOW (ref 80.0–100.0)
PLATELETS: 356 10*3/uL (ref 150–440)
RBC: 4.32 MIL/uL (ref 3.80–5.20)
RDW: 16.9 % — AB (ref 11.5–14.5)
WBC: 8.8 10*3/uL (ref 3.6–11.0)

## 2017-05-21 MED ORDER — NEOMYCIN-POLYMYXIN-HC 3.5-10000-1 OT SOLN: 3.0000 [drp] | Freq: Three times a day (TID) | OTIC | 0 refills | Status: AC

## 2017-05-21 MED ORDER — AMOXICILLIN-POT CLAVULANATE 875-125 MG PO TABS: 1.0000 | ORAL_TABLET | Freq: Two times a day (BID) | ORAL | 0 refills | Status: AC

## 2017-05-21 NOTE — ED Notes (Signed)
ED Provider at bedside. 

## 2017-05-21 NOTE — Discharge Instructions (Signed)
Please seek medical attention for any high fevers, chest pain, shortness of breath, change in behavior, persistent vomiting, bloody stool or any other new or concerning symptoms.  

## 2017-05-21 NOTE — ED Triage Notes (Signed)
Pt comes into the ED via POV c/o upper abdominal pain and left ear pain.  Patient is currently 4 months pregnant and has no OB care as of now.  Patient states abdominal pain has been there for 2 weeks.  Denies N/V/D but states she has had constipation with the last BM being yesterday.  Denies chest pain, shortness of breath, or dizziness.

## 2017-05-21 NOTE — ED Provider Notes (Signed)
[facility] Emergency Department Provider Note   ____________________________________________   I have reviewed the triage vital signs and the nursing notes.   HISTORY  Chief Complaint Otalgia and Abdominal Pain   History limited by: Not Limited   HPI Lauren [name] is a 41 y.o. female who presents to the emergency department today with primary complaint for left ear pain. The patient states the pain has been present past roughly 2 days.It is severe. She feels like the pain is now radiating into her jaw. The patient denies any trauma to her ear. No significant hearing change. No fevers. In addition the patient's oxygen complaining of some diffuse abdominal pain and constipation. The patient thinks she is roughly 4 months pregnant by dates. She has not yet had her OB/GYN appointment.   Past Medical History:  Diagnosis Date  . Advanced maternal age in multigravida   . Asthma   . Depression   . GERD (gastroesophageal reflux disease)   . [place] multiparity   . Headache   . Hypertension   . Leg fracture   . Obesity, morbid, BMI 40.0-49.9 ([place])   . Post traumatic stress disorder   . Pre-eclampsia   . SVT (supraventricular tachycardia) ([place])    a. prior report of SVT req adenosine;  b. 2011 Holter @ [facility] reportedly showed "irregular heartbeat"; c. 05/2016 Holter: "basically unremarkable" per Dr. Clayborn Bigness;  d. 07/2016 Echo: EF 50-55%. Nl RV fxn.  . Wrist fracture, closed    bilateral    Patient Active Problem List   Diagnosis Date Noted  . SVT (supraventricular tachycardia) ([place]) 10/15/2016  . Elevated blood pressure affecting pregnancy in third trimester, antepartum 10/10/2016  . Chest pain 09/23/2016  . Hypertension complicating pregnancy 45/40/9811  . Obesity 05/30/2016  . Advanced maternal age in multigravida 05/30/2016  . Depression affecting pregnancy in second trimester, antepartum 05/30/2016    Past Surgical History:  Procedure Laterality Date   . CESAREAN SECTION  2010  . CESAREAN SECTION  2011  . CHOLECYSTECTOMY    . KNEE SURGERY    . ROTATOR CUFF REPAIR    . TONSILLECTOMY      Prior to Admission medications   Medication Sig Start Date End Date Taking? Authorizing Provider  acetaminophen (TYLENOL) 325 MG tablet Take 650 mg by mouth every 6 (six) hours as needed.    [provider]  aspirin EC 81 MG tablet Take 81 mg by mouth daily.    [provider]  esomeprazole (NEXIUM) 40 MG capsule Take 40 mg by mouth daily at 12 noon.    [provider]  naproxen (NAPROSYN) 500 MG tablet Take 1 tablet (500 mg total) by mouth 2 (two) times daily with a meal. 01/27/17   Lorin Picket, PA-C  Prenat w/o A-FeCbGl-DSS-FA-DHA (CITRANATAL ASSURE) 35-1 & 300 MG tablet TAKE 1 TABLET AND TAKE 1 SOFTGEL DAILY 11/22/16   Dalia Heading, CNM  Prenatal Vit-Fe Fumarate-FA (MULTIVITAMIN-PRENATAL) 27-0.8 MG TABS tablet Take 1 tablet by mouth daily at 12 noon.    [provider]  tiZANidine (ZANAFLEX) 4 MG capsule Take 1 capsule (4 mg total) by mouth 3 (three) times daily. 01/27/17   Lorin Picket, PA-C    Allergies Nucynta [tapentadol]  Family History  Problem Relation Age of Onset  . Diabetes Sister   . Migraines Maternal Aunt   . Migraines Maternal Uncle   . Migraines Paternal Aunt   . Migraines Paternal Uncle   . Diabetes Sister   .  Diabetes Sister   . Diabetes Sister   . Diabetes Sister   . Diabetes Sister   . Ovarian cancer Cousin   . Diabetes Mother   . Diabetes Father   . Migraines Father     Social History Social History  Substance Use Topics  . Smoking status: Former Smoker    Types: Cigarettes  . Smokeless tobacco: Never Used     Comment: occasional smoker  . Alcohol use No     Comment: occ.    Review of Systems Constitutional: No fever/chills Eyes: No visual changes. ENT: Positive for left ear pain. Cardiovascular: Denies chest pain. Respiratory: Denies shortness of  breath. Gastrointestinal: No abdominal pain.  No nausea, no vomiting.  No diarrhea.   Genitourinary: Negative for dysuria. Musculoskeletal: Negative for back pain. Skin: Negative for rash. Neurological: Negative for headaches, focal weakness or numbness.  ____________________________________________   PHYSICAL EXAM:  VITAL SIGNS: ED Triage Vitals  Enc Vitals Group     BP 05/21/17 1257 (!) 156/70     Pulse Rate 05/21/17 1257 85     Resp 05/21/17 1257 16     Temp 05/21/17 1257 98.9 F (37.2 C)     Temp Source 05/21/17 1257 Oral     SpO2 05/21/17 1257 100 %     Weight 05/21/17 1257 (!) 326 lb (147.9 kg)     Height 05/21/17 1257 5\' 4"  (1.626 m)     Head Circumference --      Peak Flow --      Pain Score 05/21/17 1310 10   Constitutional: Alert and oriented. Well appearing and in no distress. Eyes: Conjunctivae are normal.  ENT   Head: Normocephalic and atraumatic. TMs wnl bilaterally.   Nose: No congestion/rhinnorhea.   Mouth/Throat: Mucous membranes are moist.   Neck: No stridor. Hematological/Lymphatic/Immunilogical: No cervical lymphadenopathy. Cardiovascular: Normal rate, regular rhythm.  No murmurs, rubs, or gallops.  Respiratory: Normal respiratory effort without tachypnea nor retractions. Breath sounds are clear and equal bilaterally. No wheezes/rales/rhonchi. Gastrointestinal: Soft and non tender. No rebound. No guarding.  Genitourinary: Deferred Musculoskeletal: Normal range of motion in all extremities. No lower extremity edema. Neurologic:  Normal speech and language. No gross focal neurologic deficits are appreciated.  Skin:  Skin is warm, dry and intact. No rash noted. Psychiatric: Mood and affect are normal. Speech and behavior are normal. Patient exhibits appropriate insight and judgment.  ____________________________________________    LABS (pertinent positives/negatives)  Labs Reviewed  COMPREHENSIVE METABOLIC PANEL - Abnormal; Notable for  the following:       Result Value   Glucose, Bld 107 (*)    Albumin 2.9 (*)    All other components within normal limits  CBC - Abnormal; Notable for the following:    Hemoglobin 11.2 (*)    HCT 33.6 (*)    MCV 77.9 (*)    MCH 25.9 (*)    RDW 16.9 (*)    All other components within normal limits  URINALYSIS, COMPLETE (UACMP) WITH MICROSCOPIC - Abnormal; Notable for the following:    Color, Urine YELLOW (*)    APPearance CLEAR (*)    Bacteria, UA RARE (*)    Squamous Epithelial / LPF 0-5 (*)    All other components within normal limits  POCT PREGNANCY, URINE - Abnormal; Notable for the following:    Preg Test, Ur POSITIVE (*)    All other components within normal limits  LIPASE, BLOOD  POC URINE PREG, ED     ____________________________________________  EKG  None  ____________________________________________    RADIOLOGY  None  ____________________________________________   PROCEDURES  Procedures  ____________________________________________   INITIAL IMPRESSION / ASSESSMENT AND PLAN / ED COURSE  Pertinent labs & imaging results that were available during my care of the patient were reviewed by me and considered in my medical decision making (see chart for details).  Patient presented to the emergency department today because of concerns for left ear pain. On exam the tympanic membrane cannot show any erythema or obvious fluid behind it. However given that the pain is also on the patient's jaw I do have concern for possible infection. Will plan on treating with amoxicillin. Additionally bedside US shows an intrauterine pregnancy.   ____________________________________________   FINAL CLINICAL IMPRESSION(S) / ED DIAGNOSES  Final diagnoses:  Left ear pain  Generalized abdominal pain     Note: This dictation was prepared with Dragon dictation. Any transcriptional errors that result from this process are unintentional     Nance Pear,  MD 05/21/17 1513

## 2017-05-22 NOTE — Telephone Encounter (Signed)
Please advise for refill. Thank you.  

## 2017-05-31 DIAGNOSIS — Z8742 Personal history of other diseases of the female genital tract: Secondary | ICD-10-CM | POA: Insufficient documentation

## 2017-06-05 IMAGING — US US EXTREM LOW VENOUS BILAT
1 series · 13 of 24 positions shown · non-contrast
Comparison: None.

CLINICAL DATA: Pregnant patient in third trimester pregnancy with
chest pain. Evaluate for DVT.



[Series 1: us extrem low venous bilat · 0.08mm/px · 13 of 41 slices shown]
[im 1/41]
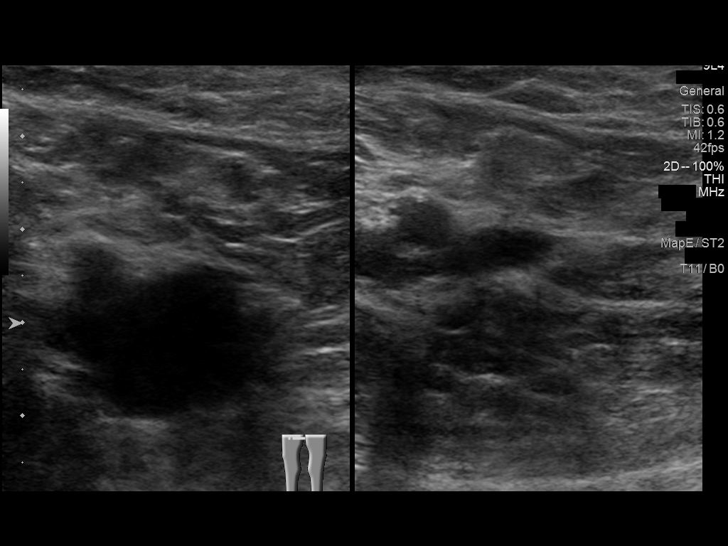
[im 4/41]
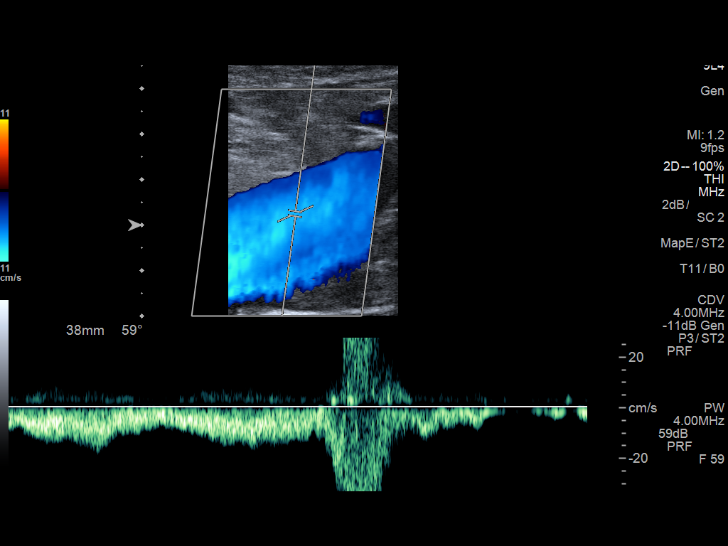
[im 7/41]
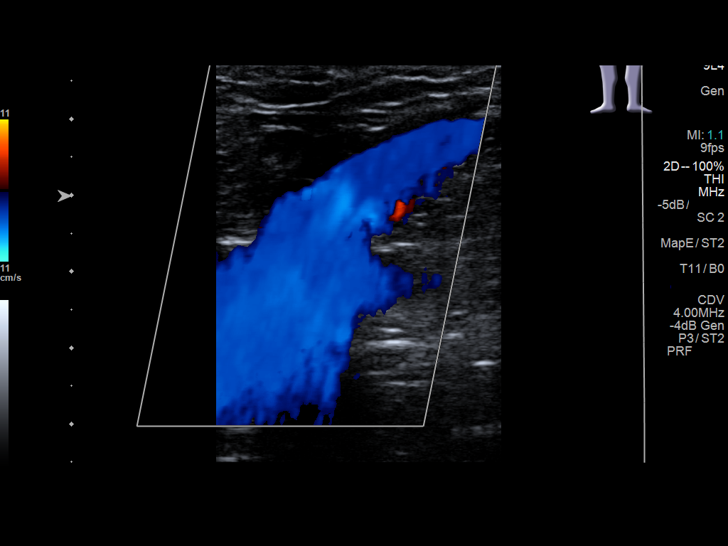
[im 11/41]
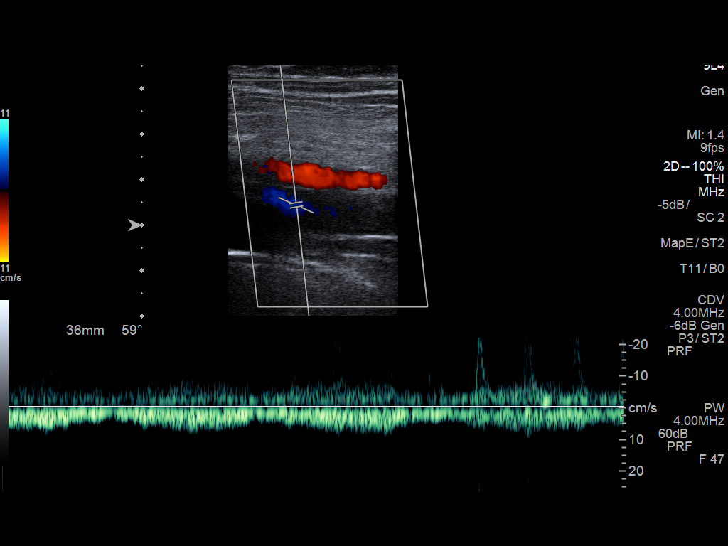
[im 14/41]
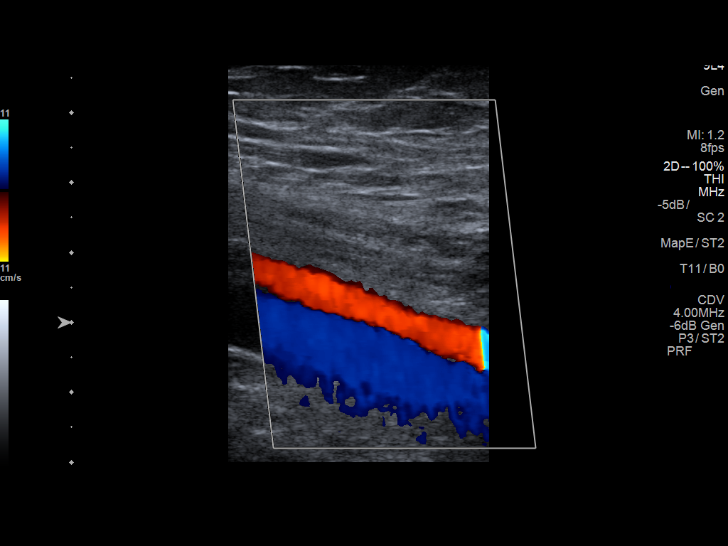
[im 18/41]
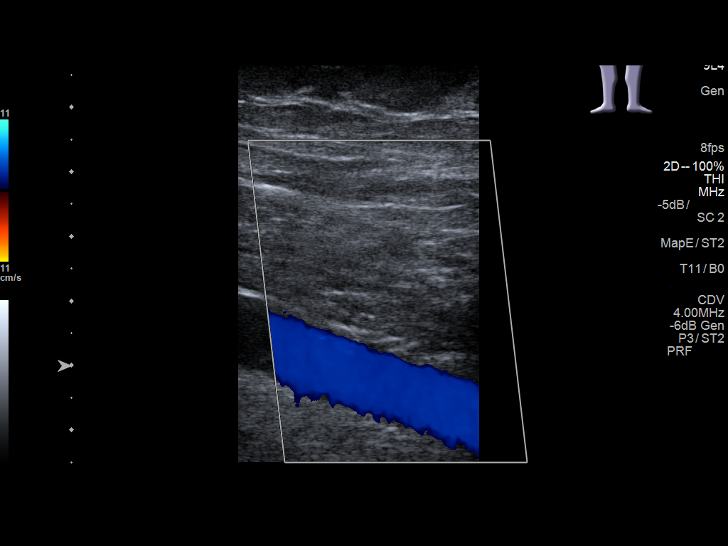
[im 21/41]
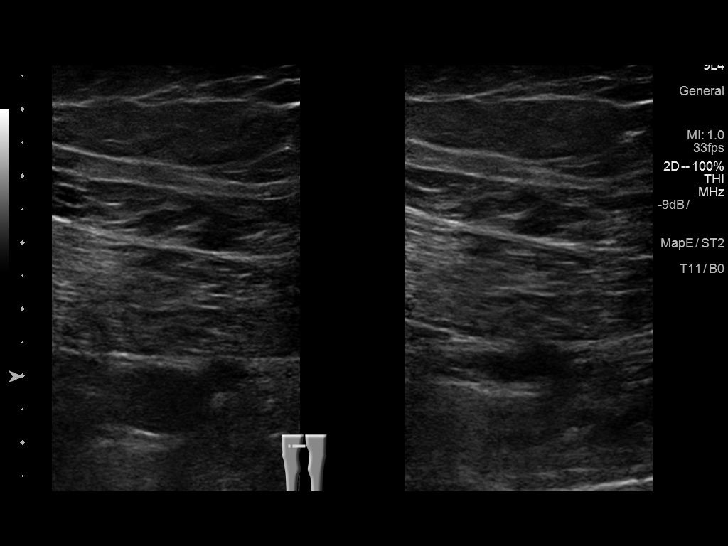
[im 23/41]
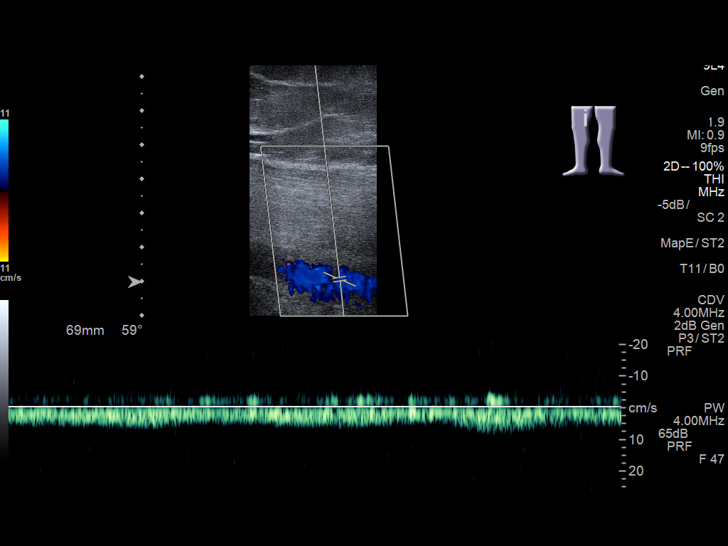
[im 27/41]
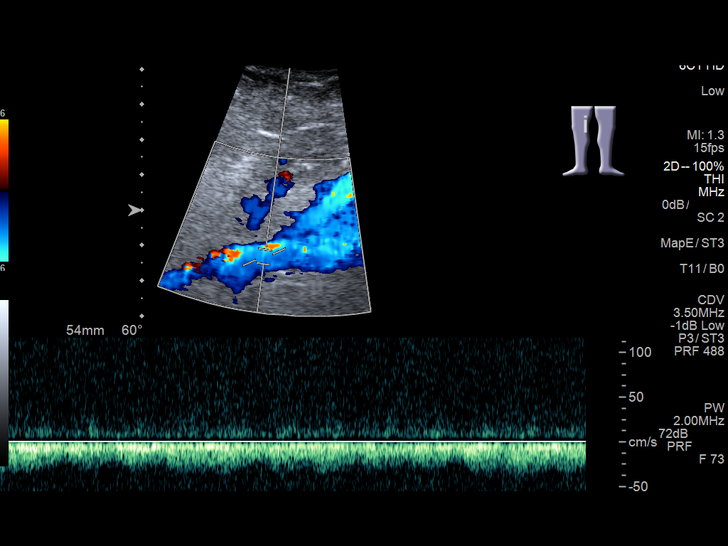
[im 30/41]
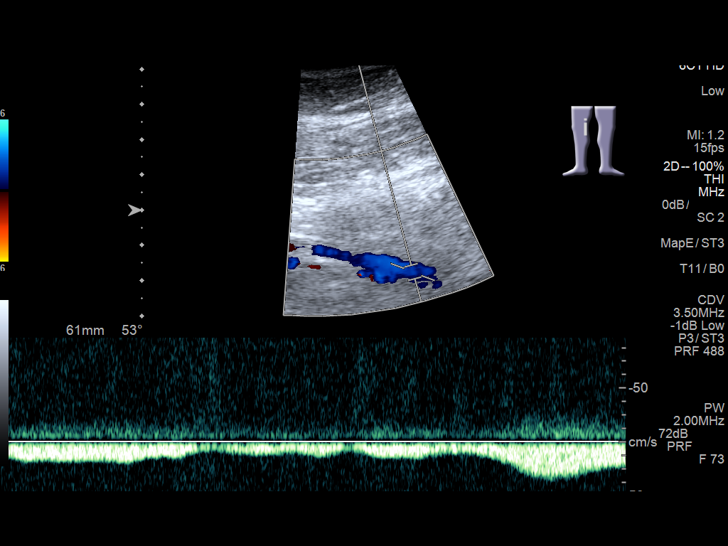
[im 34/41]
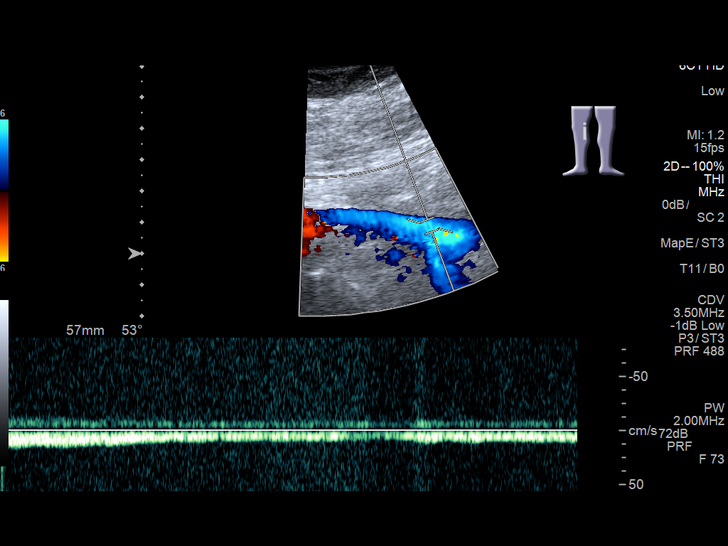
[im 37/41]
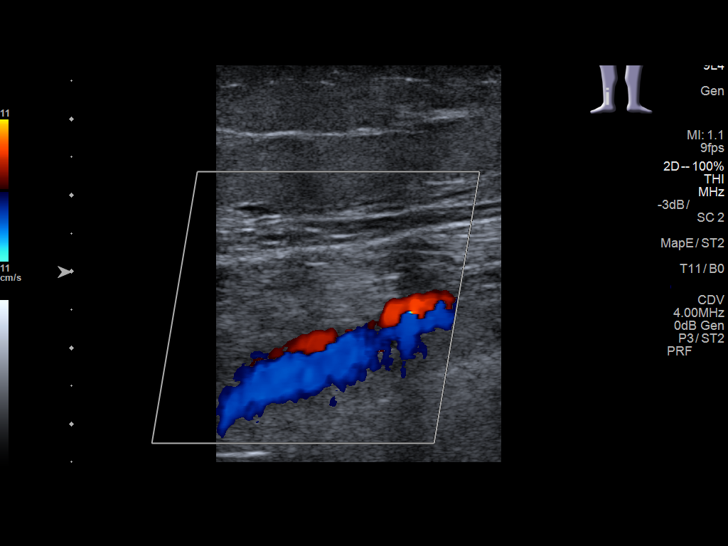
[im 41/41]
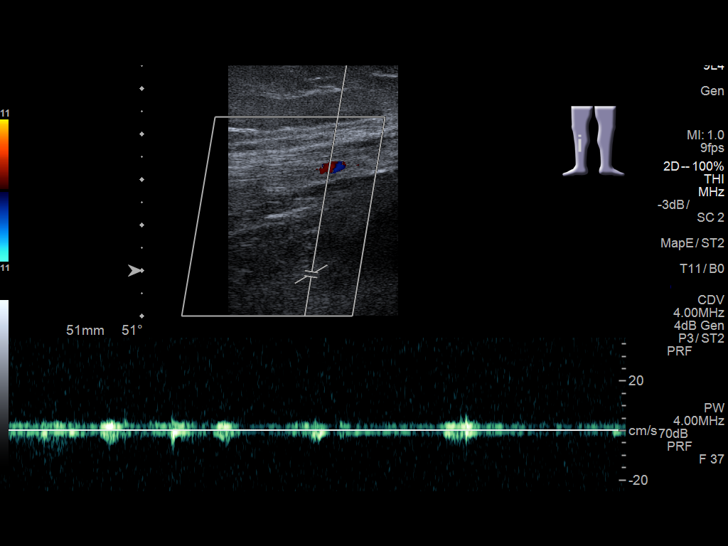

[13 of 24 positions shown; findings below may reference images not displayed]

FINDINGS: RIGHT LOWER EXTREMITY

Common Femoral Vein: No evidence of thrombus. Normal
compressibility, respiratory phasicity and response to augmentation.

Saphenofemoral Junction: No evidence of thrombus. Normal
compressibility and flow on color Doppler imaging.

Profunda Femoral Vein: No evidence of thrombus. Normal
compressibility and flow on color Doppler imaging.

Femoral Vein: No evidence of thrombus. Normal compressibility,
respiratory phasicity and response to augmentation.

Popliteal Vein: No evidence of thrombus. Normal compressibility,
respiratory phasicity and response to augmentation.

Calf Veins: No evidence of thrombus. Normal compressibility and flow
on color Doppler imaging.

Superficial Great Saphenous Vein: No evidence of thrombus. Normal
compressibility and flow on color Doppler imaging.

Venous Reflux:  None.

Other Findings:  None.

LEFT LOWER EXTREMITY

Common Femoral Vein: No evidence of thrombus. Normal
compressibility, respiratory phasicity and response to augmentation.

Saphenofemoral Junction: No evidence of thrombus. Normal
compressibility and flow on color Doppler imaging.

Profunda Femoral Vein: No evidence of thrombus. Normal
compressibility and flow on color Doppler imaging.

Femoral Vein: No evidence of thrombus. Normal compressibility,
respiratory phasicity and response to augmentation.

Popliteal Vein: No evidence of thrombus. Normal compressibility,
respiratory phasicity and response to augmentation.

Calf Veins: No evidence of thrombus in the posterior tibial vein.
Normal compressibility and flow on color Doppler imaging. Peroneal
vein is not visualized.

Superficial Great Saphenous Vein: No evidence of thrombus. Normal
compressibility and flow on color Doppler imaging.

Venous Reflux:  None.

Other Findings:  None.
IMPRESSION: No evidence of bilateral lower extremity deep venous thrombosis.

## 2017-07-03 DIAGNOSIS — T7491XA Unspecified adult maltreatment, confirmed, initial encounter: Secondary | ICD-10-CM | POA: Insufficient documentation

## 2017-08-14 ENCOUNTER — Emergency Department
Admission: EM | Admit: 2017-08-14 | Discharge: 2017-08-14 | Disposition: A | Discharge status: Home / Self Care | Payer: Medicaid Other | Attending: Emergency Medicine | Admitting: Emergency Medicine

## 2017-08-14 ENCOUNTER — Other Ambulatory Visit: Payer: Self-pay

## 2017-08-14 DIAGNOSIS — I10 Essential (primary) hypertension: Secondary | ICD-10-CM | POA: Diagnosis not present

## 2017-08-14 DIAGNOSIS — Z79899 Other long term (current) drug therapy: Secondary | ICD-10-CM | POA: Diagnosis not present

## 2017-08-14 DIAGNOSIS — Z87891 Personal history of nicotine dependence: Secondary | ICD-10-CM | POA: Diagnosis not present

## 2017-08-14 DIAGNOSIS — O99413 Diseases of the circulatory system complicating pregnancy, third trimester: Secondary | ICD-10-CM | POA: Diagnosis not present

## 2017-08-14 DIAGNOSIS — R000 Tachycardia, unspecified: Secondary | ICD-10-CM | POA: Diagnosis present

## 2017-08-14 DIAGNOSIS — J45909 Unspecified asthma, uncomplicated: Secondary | ICD-10-CM | POA: Diagnosis not present

## 2017-08-14 DIAGNOSIS — I471 Supraventricular tachycardia: Secondary | ICD-10-CM | POA: Insufficient documentation

## 2017-08-14 DIAGNOSIS — Z3A29 29 weeks gestation of pregnancy: Secondary | ICD-10-CM | POA: Diagnosis not present

## 2017-08-14 LAB — BASIC METABOLIC PANEL
Anion gap: 8 (ref 5–15)
BUN: 12 mg/dL (ref 6–20)
CO2: 20 mmol/L — ABNORMAL LOW (ref 22–32)
CREATININE: 0.89 mg/dL (ref 0.44–1.00)
Calcium: 8.5 mg/dL — ABNORMAL LOW (ref 8.9–10.3)
Chloride: 107 mmol/L (ref 101–111)
GFR calc Af Amer: >60 mL/min (ref >60)
GLUCOSE: 131 mg/dL — AB (ref 65–99)
POTASSIUM: 3.8 mmol/L (ref 3.5–5.1)
SODIUM: 135 mmol/L (ref 135–145)

## 2017-08-14 LAB — CBC WITH DIFFERENTIAL/PLATELET
Basophils Absolute: 0 10*3/uL (ref 0–0.1)
Basophils Relative: 1 %
EOS ABS: 0.1 10*3/uL (ref 0–0.7)
EOS PCT: 1 %
HCT: 34 % — ABNORMAL LOW (ref 35.0–47.0)
Hemoglobin: 11.1 g/dL — ABNORMAL LOW (ref 12.0–16.0)
LYMPHS ABS: 2.1 10*3/uL (ref 1.0–3.6)
LYMPHS PCT: 21 %
MCH: 26.4 pg (ref 26.0–34.0)
MCHC: 32.7 g/dL (ref 32.0–36.0)
MCV: 80.7 fL (ref 80.0–100.0)
MONO ABS: 0.6 10*3/uL (ref 0.2–0.9)
MONOS PCT: 6 %
Neutro Abs: 7 10*3/uL — ABNORMAL HIGH (ref 1.4–6.5)
Neutrophils Relative %: 71 %
PLATELETS: 308 10*3/uL (ref 150–440)
RBC: 4.22 MIL/uL (ref 3.80–5.20)
RDW: 15.5 % — AB (ref 11.5–14.5)
WBC: 9.9 10*3/uL (ref 3.6–11.0)

## 2017-08-14 MED ORDER — ADENOSINE 12 MG/4ML IV SOLN
12.0000 mg | Freq: Once | INTRAVENOUS | Status: AC
  Administered 2017-08-14: 12 mg via INTRAVENOUS

## 2017-08-14 MED ORDER — ADENOSINE 6 MG/2ML IV SOLN
6.0000 mg | Freq: Once | INTRAVENOUS | Status: AC
  Administered 2017-08-14: 6 mg via INTRAVENOUS

## 2017-08-14 NOTE — Discharge Instructions (Signed)
Please seek medical attention for any high fevers, chest pain, shortness of breath, change in behavior, persistent vomiting, bloody stool or any other new or concerning symptoms.  

## 2017-08-14 NOTE — ED Notes (Signed)
Paced placed on zoll monitor. MD at bedside.

## 2017-08-14 NOTE — ED Triage Notes (Signed)
Patient is [redacted] weeks pregnant and presents with rapid heart rate. Patient states earlier in the day she was lightheaded and had some chest pain. Has tried Valsalva maneuvers at home without relief. Patient is alert and oriented at this time. MD in room for evaluation.

## 2017-08-14 NOTE — ED Notes (Signed)
Rapid 10cc flush followed medication push

## 2017-08-14 NOTE — ED Notes (Signed)
Pt resting comfortably texting. No c/o chest pain. C/o slight headache

## 2017-08-14 NOTE — ED Notes (Signed)
Pt with SVT [redacted] weeks pregnant. Hx same with 1st pregnancy in 10/2016

## 2017-08-14 NOTE — ED Provider Notes (Addendum)
[facility] Emergency Department Provider Note   ____________________________________________   I have reviewed the triage vital signs and the nursing notes.   HISTORY  Chief Complaint Tachycardia   History limited by: Not Limited   HPI Lauren [name] is a 41 y.o. female who presents to the emergency department today because of concern for palpitations and fast heart rate.  LOCATION:chest  DURATION:roughly 1 hour TIMING: constant SEVERITY: severe QUALITY: heart is racing CONTEXT: patient is [redacted] weeks pregnant. States that she has a history of getting SVT during pregnancy. Had otherwise been feeling alright today. Did have some caffeine earlier in the day. Tried valsalva maneuvers without change in symptoms prior to presenting to the ED. States she is on diltiazem and took her dose today. MODIFYING FACTORS: none ASSOCIATED SYMPTOMS: lightheadedness   Per medical record review patient has a history of SVT during pregnancy  Past Medical History:  Diagnosis Date  . Advanced maternal age in multigravida   . Asthma   . Depression   . GERD (gastroesophageal reflux disease)   . [place] multiparity   . Headache   . Hypertension   . Leg fracture   . Obesity, morbid, BMI 40.0-49.9 ([place])   . Post traumatic stress disorder   . Pre-eclampsia   . SVT (supraventricular tachycardia) ([place])    a. prior report of SVT req adenosine;  b. 2011 Holter @ [facility] reportedly showed "irregular heartbeat"; c. 05/2016 Holter: "basically unremarkable" per Dr. Clayborn Bigness;  d. 07/2016 Echo: EF 50-55%. Nl RV fxn.  . Wrist fracture, closed    bilateral    Patient Active Problem List   Diagnosis Date Noted  . SVT (supraventricular tachycardia) ([place]) 10/15/2016  . Elevated blood pressure affecting pregnancy in third trimester, antepartum 10/10/2016  . Chest pain 09/23/2016  . Hypertension complicating pregnancy 07/06/8526  . Obesity 05/30/2016  . Advanced maternal age in  multigravida 05/30/2016  . Depression affecting pregnancy in second trimester, antepartum 05/30/2016    Past Surgical History:  Procedure Laterality Date  . CESAREAN SECTION  2010  . CESAREAN SECTION  2011  . CHOLECYSTECTOMY    . KNEE SURGERY    . ROTATOR CUFF REPAIR    . TONSILLECTOMY      Prior to Admission medications   Medication Sig Start Date End Date Taking? Authorizing Provider  acetaminophen (TYLENOL) 325 MG tablet Take 650 mg by mouth every 6 (six) hours as needed.    [provider]  aspirin EC 81 MG tablet Take 81 mg by mouth daily.    [provider]  esomeprazole (NEXIUM) 40 MG capsule Take 40 mg by mouth daily at 12 noon.    [provider]  naproxen (NAPROSYN) 500 MG tablet Take 1 tablet (500 mg total) by mouth 2 (two) times daily with a meal. 01/27/17   Lorin Picket, PA-C  Prenat w/o A-FeCbGl-DSS-FA-DHA (CITRANATAL ASSURE) 35-1 & 300 MG tablet TAKE 1 TABLET AND TAKE 1 SOFTGEL DAILY 11/22/16   Dalia Heading, CNM  Prenatal Vit-Fe Fumarate-FA (MULTIVITAMIN-PRENATAL) 27-0.8 MG TABS tablet Take 1 tablet by mouth daily at 12 noon.    [provider]  tiZANidine (ZANAFLEX) 4 MG capsule Take 1 capsule (4 mg total) by mouth 3 (three) times daily. 01/27/17   Lorin Picket, PA-C    Allergies Nucynta [tapentadol]  Family History  Problem Relation Age of Onset  . Diabetes Sister   . Migraines Maternal Aunt   . Migraines Maternal Uncle   . Migraines Paternal  Aunt   . Migraines Paternal Uncle   . Diabetes Sister   . Diabetes Sister   . Diabetes Sister   . Diabetes Sister   . Diabetes Sister   . Ovarian cancer Cousin   . Diabetes Mother   . Diabetes Father   . Migraines Father     Social History Social History   Tobacco Use  . Smoking status: Former Smoker    Types: Cigarettes  . Smokeless tobacco: Never Used  . Tobacco comment: occasional smoker  Substance Use Topics  . Alcohol use: No    Comment: occ.  . Drug  use: No    Comment: last used several months ago    Review of Systems Constitutional: No fever/chills Eyes: No visual changes. ENT: No sore throat. Cardiovascular: Positive for palpitations. Chest pain. Respiratory: Denies shortness of breath. Gastrointestinal: No abdominal pain.  No nausea, no vomiting.  No diarrhea.   Genitourinary: Negative for dysuria. Musculoskeletal: Negative for back pain. Skin: Negative for rash. Neurological: Negative for headaches, focal weakness or numbness.  ____________________________________________   PHYSICAL EXAM:  VITAL SIGNS: ED Triage Vitals  Enc Vitals Group     BP 08/14/17 0030 (!) 159/118     Pulse Rate 08/14/17 0030 (!) 208     Resp 08/14/17 0030 (!) 36     Temp --      Temp src --      SpO2 08/14/17 0030 97 %     Weight 08/14/17 0030 293 lb (132.9 kg)     Height 08/14/17 0030 5\' 4"  (1.626 m)     Head Circumference --      Peak Flow --      Pain Score 08/14/17 0029 7   Constitutional: Alert and oriented. Well appearing and in no distress. Eyes: Conjunctivae are normal.  ENT   Head: Normocephalic and atraumatic.   Nose: No congestion/rhinnorhea.   Mouth/Throat: Mucous membranes are moist.   Neck: No stridor. Hematological/Lymphatic/Immunilogical: No cervical lymphadenopathy. Cardiovascular: Tachycardic.  regular rhythm.  No murmurs, rubs, or gallops.  Respiratory: Normal respiratory effort without tachypnea nor retractions. Breath sounds are clear and equal bilaterally. No wheezes/rales/rhonchi. Gastrointestinal: Soft and gravid. No rebound. No guarding.  Genitourinary: Deferred Musculoskeletal: Normal range of motion in all extremities. No lower extremity edema. Neurologic:  Normal speech and language. No gross focal neurologic deficits are appreciated.  Skin:  Skin is warm, dry and intact. No rash noted. Psychiatric: Mood and affect are normal. Speech and behavior are normal. Patient exhibits appropriate insight  and judgment.  ____________________________________________    LABS (pertinent positives/negatives)  CBC wbc 9.9, hgb 11.1 BMP glu 131, cr 0.89  ____________________________________________   EKG  I, Nance Pear, attending physician, personally viewed and interpreted this EKG  EKG Time: 0020 Rate: 203 Rhythm: SVT Axis: normal Intervals: qtc 408 QRS: narrow ST changes: no st elevation Impression: abnormal ekg  I, Nance Pear, attending physician, personally viewed and interpreted this EKG  EKG Time: 0243 Rate: 101 Rhythm: sinus tachycardia Axis: normal Intervals: qtc 428 QRS: narrow ST changes: no st elevation Impression: sinus tachycardia otherwise normal ekg  ____________________________________________    RADIOLOGY  None  ____________________________________________   PROCEDURES  Procedures  CRITICAL CARE Performed by: Nance Pear   Total critical care time: 30 minutes  Critical care time was exclusive of separately billable procedures and treating other patients.  Critical care was necessary to treat or prevent imminent or life-threatening deterioration.  Critical care was time spent personally by me on  the following activities: development of treatment plan with patient and/or surrogate as well as nursing, discussions with consultants, evaluation of patient's response to treatment, examination of patient, obtaining history from patient or surrogate, ordering and performing treatments and interventions, ordering and review of laboratory studies, ordering and review of radiographic studies, pulse oximetry and re-evaluation of patient's condition.  ____________________________________________   INITIAL IMPRESSION / ASSESSMENT AND PLAN / ED COURSE  Pertinent labs & imaging results that were available during my care of the patient were reviewed by me and considered in my medical decision making (see chart for details).  Patient  presented to the emergency department today because of concerns for palpitations and fast heart rate.  Initial EKG does show supraventricular tachycardia.  Per chart review patient does have a history of this.  She typically does get them during her pregnancies.  She states she is on diltiazem and took her dose today.  The SVT broke with 12 mg of adenosine.  She then went into a normal sinus rhythm.  She did have good resolution of symptoms after this.  Blood work shows a slight anemia which is chronic for the patient.  No concerning electrolyte abnormalities.  Patient had good fetal heart tones.  Initially patient has been feeling fetal heart movement.  I discussed with Dr. Glennon Mac with OB/GYN.  He felt the patient had good fetal movement and heart rate no need to necessarily observe.  Patient did have a initial high blood pressure however this did resolve once patient's symptoms resolved.  I did discuss with patient option to have fetal monitoring.  Patient felt comfortable deferring at this time.  I did offer to do it however.   Discussed with patient/family results of testing/physical exam, differential plan and return precautions.  ____________________________________________   FINAL CLINICAL IMPRESSION(S) / ED DIAGNOSES  Final diagnoses:  SVT (supraventricular tachycardia) ([facility])  [redacted] weeks gestation of pregnancy     Note: This dictation was prepared with Diplomatic Services operational officer dictation. Any transcriptional errors that result from this process are unintentional     Nance Pear, MD 08/14/17 [place], [place], MD 08/14/17 [phone]

## 2017-08-14 NOTE — ED Notes (Signed)
Rapid 10cc ns flush followed medication push

## 2017-08-17 DIAGNOSIS — R740 Nonspecific elevation of levels of transaminase and lactic acid dehydrogenase [LDH]: Secondary | ICD-10-CM

## 2017-08-17 DIAGNOSIS — R7401 Elevation of levels of liver transaminase levels: Secondary | ICD-10-CM | POA: Insufficient documentation

## 2017-08-25 ENCOUNTER — Emergency Department: Payer: Medicaid Other

## 2017-08-25 ENCOUNTER — Emergency Department
Admission: EM | Admit: 2017-08-25 | Discharge: 2017-08-26 | Disposition: A | Discharge status: Home / Self Care | Payer: Medicaid Other | Attending: Emergency Medicine | Admitting: Emergency Medicine

## 2017-08-25 ENCOUNTER — Encounter: Payer: Self-pay | Admitting: Emergency Medicine

## 2017-08-25 DIAGNOSIS — Z7982 Long term (current) use of aspirin: Secondary | ICD-10-CM | POA: Diagnosis not present

## 2017-08-25 DIAGNOSIS — Z87891 Personal history of nicotine dependence: Secondary | ICD-10-CM | POA: Diagnosis not present

## 2017-08-25 DIAGNOSIS — Z79899 Other long term (current) drug therapy: Secondary | ICD-10-CM | POA: Insufficient documentation

## 2017-08-25 DIAGNOSIS — J45909 Unspecified asthma, uncomplicated: Secondary | ICD-10-CM | POA: Diagnosis not present

## 2017-08-25 DIAGNOSIS — I10 Essential (primary) hypertension: Secondary | ICD-10-CM | POA: Diagnosis not present

## 2017-08-25 DIAGNOSIS — R079 Chest pain, unspecified: Secondary | ICD-10-CM | POA: Diagnosis present

## 2017-08-25 DIAGNOSIS — R000 Tachycardia, unspecified: Secondary | ICD-10-CM

## 2017-08-25 DIAGNOSIS — I471 Supraventricular tachycardia: Secondary | ICD-10-CM | POA: Insufficient documentation

## 2017-08-25 LAB — HEPATIC FUNCTION PANEL
ALK PHOS: 91 U/L (ref 38–126)
ALT: 42 U/L (ref 14–54)
AST: 36 U/L (ref 15–41)
Albumin: 2.6 g/dL — ABNORMAL LOW (ref 3.5–5.0)
TOTAL PROTEIN: 7.1 g/dL (ref 6.5–8.1)
Total Bilirubin: <0.1 mg/dL — ABNORMAL LOW (ref 0.3–1.2)

## 2017-08-25 LAB — CBC
HEMATOCRIT: 34.8 % — AB (ref 35.0–47.0)
HEMOGLOBIN: 11.3 g/dL — AB (ref 12.0–16.0)
MCH: 26 pg (ref 26.0–34.0)
MCHC: 32.5 g/dL (ref 32.0–36.0)
MCV: 79.9 fL — ABNORMAL LOW (ref 80.0–100.0)
Platelets: 336 10*3/uL (ref 150–440)
RBC: 4.36 MIL/uL (ref 3.80–5.20)
RDW: 15.3 % — ABNORMAL HIGH (ref 11.5–14.5)
WBC: 10.8 10*3/uL (ref 3.6–11.0)

## 2017-08-25 LAB — BASIC METABOLIC PANEL
ANION GAP: 10 (ref 5–15)
BUN: 13 mg/dL (ref 6–20)
CO2: 21 mmol/L — AB (ref 22–32)
Calcium: 8.7 mg/dL — ABNORMAL LOW (ref 8.9–10.3)
Chloride: 105 mmol/L (ref 101–111)
Creatinine, Ser: 0.75 mg/dL (ref 0.44–1.00)
GFR calc Af Amer: >60 mL/min (ref >60)
GFR calc non Af Amer: >60 mL/min (ref >60)
GLUCOSE: 105 mg/dL — AB (ref 65–99)
POTASSIUM: 3.8 mmol/L (ref 3.5–5.1)
Sodium: 136 mmol/L (ref 135–145)

## 2017-08-25 LAB — TROPONIN I: Troponin I: <0.03 ng/mL (ref <0.03)

## 2017-08-25 MED ORDER — SODIUM CHLORIDE 0.9 % IV BOLUS (SEPSIS)
1000.0000 mL | Freq: Once | INTRAVENOUS | Status: AC
  Administered 2017-08-25: 1000 mL via INTRAVENOUS

## 2017-08-25 MED ORDER — ACETAMINOPHEN 500 MG PO TABS
1000.0000 mg | ORAL_TABLET | Freq: Once | ORAL | Status: AC
  Administered 2017-08-25: 1000 mg via ORAL
  Filled 2017-08-25: qty 2

## 2017-08-25 NOTE — ED Notes (Addendum)
Pt states she has chest pain, high blood pressure, and a HR that reached up to 215 all today while just sitting. Pt also reports light headedness, diaphoretic and dizziness. Pt is also 7 months pregnant. Pt also reported that she left [facility] last week because her liver enzymes were high.  Family at bedside. Pt calm and cooperative

## 2017-08-25 NOTE — ED Provider Notes (Signed)
[facility] Emergency Department Provider Note   ____________________________________________   I have reviewed the triage vital signs and the nursing notes.   HISTORY  Chief Complaint Chest Pain   History limited by: Not Limited   HPI Lauren [name] is a 41 y.o. female who presents to the emergency department today because of concern for fast heart rate and chest pain.   LOCATION:left chest DURATION:started roughly 2 hours ago TIMING: constant, has improved since it began SEVERITY: heart rate >200 QUALITY: pressure CONTEXT: patient with history of SVT primarily in the setting of pregnancy. Stated what she felt earlier today reminded her of her episodes of SVT. After she arrived to the emergency department she felt it get better MODIFYING FACTORS: none identified ASSOCIATED SYMPTOMS: none  Per medical record review patient has a history of SVT, recent ED visit for similar symptoms.  Past Medical History:  Diagnosis Date  . Advanced maternal age in multigravida   . Asthma   . Depression   . GERD (gastroesophageal reflux disease)   . [place] multiparity   . Headache   . Hypertension   . Leg fracture   . Obesity, morbid, BMI 40.0-49.9 ([place])   . Post traumatic stress disorder   . Pre-eclampsia   . SVT (supraventricular tachycardia) ([place])    a. prior report of SVT req adenosine;  b. 2011 Holter @ [facility] reportedly showed "irregular heartbeat"; c. 05/2016 Holter: "basically unremarkable" per Dr. Clayborn Bigness;  d. 07/2016 Echo: EF 50-55%. Nl RV fxn.  . Wrist fracture, closed    bilateral    Patient Active Problem List   Diagnosis Date Noted  . SVT (supraventricular tachycardia) ([place]) 10/15/2016  . Elevated blood pressure affecting pregnancy in third trimester, antepartum 10/10/2016  . Chest pain 09/23/2016  . Hypertension complicating pregnancy 94/85/4627  . Obesity 05/30/2016  . Advanced maternal age in multigravida 05/30/2016  . Depression  affecting pregnancy in second trimester, antepartum 05/30/2016    Past Surgical History:  Procedure Laterality Date  . CESAREAN SECTION  2010  . CESAREAN SECTION  2011  . CHOLECYSTECTOMY    . KNEE SURGERY    . ROTATOR CUFF REPAIR    . TONSILLECTOMY      Prior to Admission medications   Medication Sig Start Date End Date Taking? Authorizing Provider  acetaminophen (TYLENOL) 325 MG tablet Take 650 mg by mouth every 6 (six) hours as needed.    [provider]  aspirin EC 81 MG tablet Take 81 mg by mouth daily.    [provider]  diltiazem (CARDIZEM CD) 120 MG 24 hr capsule Take 120 mg by mouth daily.    [provider]  esomeprazole (NEXIUM) 40 MG capsule Take 40 mg by mouth daily at 12 noon.    [provider]  naproxen (NAPROSYN) 500 MG tablet Take 1 tablet (500 mg total) by mouth 2 (two) times daily with a meal. Patient not taking: Reported on 08/14/2017 01/27/17   Lorin Picket, PA-C  Prenat w/o A-FeCbGl-DSS-FA-DHA (CITRANATAL ASSURE) 35-1 & 300 MG tablet TAKE 1 TABLET AND TAKE 1 SOFTGEL DAILY Patient not taking: Reported on 08/14/2017 11/22/16   Dalia Heading, CNM  Prenat-FeAsp-Meth-FA-DHA w/o A (PRENATE DHA) 18-0.6-0.4-300 MG CAPS Take 1 tablet by mouth daily.    [provider]  Prenatal Vit-Fe Fumarate-FA (MULTIVITAMIN-PRENATAL) 27-0.8 MG TABS tablet Take 1 tablet by mouth daily at 12 noon.    [provider]  tiZANidine (ZANAFLEX) 4 MG capsule Take 1 capsule (4  mg total) by mouth 3 (three) times daily. Patient not taking: Reported on 08/14/2017 01/27/17   Lorin Picket, PA-C    Allergies Nucynta [tapentadol]  Family History  Problem Relation Age of Onset  . Diabetes Sister   . Migraines Maternal Aunt   . Migraines Maternal Uncle   . Migraines Paternal Aunt   . Migraines Paternal Uncle   . Diabetes Sister   . Diabetes Sister   . Diabetes Sister   . Diabetes Sister   . Diabetes Sister   . Ovarian cancer  Cousin   . Diabetes Mother   . Diabetes Father   . Migraines Father     Social History Social History   Tobacco Use  . Smoking status: Former Smoker    Types: Cigarettes  . Smokeless tobacco: Never Used  . Tobacco comment: occasional smoker  Substance Use Topics  . Alcohol use: No    Comment: occ.  . Drug use: No    Comment: last used several months ago    Review of Systems Constitutional: No fever/chills Eyes: No visual changes. ENT: No sore throat. Cardiovascular: Denies chest pain. Respiratory: Denies shortness of breath. Gastrointestinal: No abdominal pain.  No nausea, no vomiting.  No diarrhea.   Genitourinary: Negative for dysuria. Musculoskeletal: Negative for back pain. Skin: Negative for rash. Neurological: Negative for headaches, focal weakness or numbness.  ____________________________________________   PHYSICAL EXAM:  VITAL SIGNS: ED Triage Vitals  Enc Vitals Group     BP 08/25/17 1954 115/67     Pulse Rate 08/25/17 1952 (!) 131     Resp 08/25/17 1952 18     Temp 08/25/17 1952 98.3 F (36.8 C)     Temp Source 08/25/17 1952 Oral     SpO2 08/25/17 1952 98 %     Weight 08/25/17 1952 293 lb (132.9 kg)   Constitutional: Alert and oriented. Well appearing and in no distress. Eyes: Conjunctivae are normal.  ENT   Head: Normocephalic and atraumatic.   Nose: No congestion/rhinnorhea.   Mouth/Throat: Mucous membranes are moist.   Neck: No stridor. Hematological/Lymphatic/Immunilogical: No cervical lymphadenopathy. Cardiovascular: Tachycardic, regular rhythm.  No murmurs, rubs, or gallops. Respiratory: Normal respiratory effort without tachypnea nor retractions. Breath sounds are clear and equal bilaterally. No wheezes/rales/rhonchi. Gastrointestinal: Soft and non tender. No rebound. No guarding.  Genitourinary: Deferred Musculoskeletal: Normal range of motion in all extremities. No lower extremity edema. Neurologic:  Normal speech and  language. No gross focal neurologic deficits are appreciated.  Skin:  Skin is warm, dry and intact. No rash noted. Psychiatric: Mood and affect are normal. Speech and behavior are normal. Patient exhibits appropriate insight and judgment.  ____________________________________________    LABS (pertinent positives/negatives)  BMP cr 0.75, glu 105 CBC wbc 10.8, hgb 11.3, plt 336 Trop <0.03 LFTs ast, alt, alk phos wnl  ____________________________________________   EKG  I, Nance Pear, attending physician, personally viewed and interpreted this EKG  EKG Time: 1953 Rate: 130 Rhythm: sinus tachycardia Axis: normal Intervals: qtc 441 QRS: narrow ST changes: no st elevation Impression: abnormal ekg   ____________________________________________    RADIOLOGY  CXR No acute disease  ____________________________________________   PROCEDURES  Procedures  ____________________________________________   INITIAL IMPRESSION / ASSESSMENT AND PLAN / ED COURSE  Pertinent labs & imaging results that were available during my care of the patient were reviewed by me and considered in my medical decision making (see chart for details).  Patient presented to the emergency department today because of concerns for  chest pain and fast heart rate.  Differential would be broad including arrhythmia, cardiac ischemia, pneumonia, pneumothorax, PE amongst other etiologies.  Patient does have a history of known SVT.  At the time of my evaluation patient was not in SVT but was in sinus tachycardia.  She stated that she did feel improved.  Given the tachycardia however patient was given IV fluids.  Her heart rate did improve after 1 L.  At rest it would be under 100 however with movement it would elevate around 110.  Will plan on giving another liter.  Will prepare paperwork in the event that the patient continues to feel improvement in heart rate  improves.    ____________________________________________   FINAL CLINICAL IMPRESSION(S) / ED DIAGNOSES  Final diagnoses:  Sinus tachycardia     Note: This dictation was prepared with Dragon dictation. Any transcriptional errors that result from this process are unintentional     Nance Pear, MD 08/25/17 2322

## 2017-08-25 NOTE — Discharge Instructions (Signed)
Please seek medical attention for any high fevers, chest pain, shortness of breath, change in behavior, persistent vomiting, bloody stool or any other new or concerning symptoms.  

## 2017-08-25 NOTE — ED Triage Notes (Signed)
Pt reports she has had left sided chest pain, SOB and dizziness, starting approximately 1 hour ago. Pt has HX of SVT.

## 2017-09-05 ENCOUNTER — Encounter: Payer: Self-pay | Admitting: Emergency Medicine

## 2017-09-05 ENCOUNTER — Other Ambulatory Visit: Payer: Self-pay

## 2017-09-05 ENCOUNTER — Emergency Department
Admission: EM | Admit: 2017-09-05 | Discharge: 2017-09-05 | Payer: Medicaid Other | Attending: Emergency Medicine | Admitting: Emergency Medicine

## 2017-09-05 DIAGNOSIS — Z79899 Other long term (current) drug therapy: Secondary | ICD-10-CM | POA: Diagnosis not present

## 2017-09-05 DIAGNOSIS — R51 Headache: Secondary | ICD-10-CM

## 2017-09-05 DIAGNOSIS — O133 Gestational [pregnancy-induced] hypertension without significant proteinuria, third trimester: Secondary | ICD-10-CM

## 2017-09-05 DIAGNOSIS — J45909 Unspecified asthma, uncomplicated: Secondary | ICD-10-CM | POA: Insufficient documentation

## 2017-09-05 DIAGNOSIS — I471 Supraventricular tachycardia: Secondary | ICD-10-CM | POA: Diagnosis not present

## 2017-09-05 DIAGNOSIS — Z87891 Personal history of nicotine dependence: Secondary | ICD-10-CM | POA: Insufficient documentation

## 2017-09-05 DIAGNOSIS — Z7982 Long term (current) use of aspirin: Secondary | ICD-10-CM | POA: Insufficient documentation

## 2017-09-05 DIAGNOSIS — O10019 Pre-existing essential hypertension complicating pregnancy, unspecified trimester: Secondary | ICD-10-CM

## 2017-09-05 DIAGNOSIS — Z3A32 32 weeks gestation of pregnancy: Secondary | ICD-10-CM | POA: Insufficient documentation

## 2017-09-05 DIAGNOSIS — O9989 Other specified diseases and conditions complicating pregnancy, childbirth and the puerperium: Secondary | ICD-10-CM | POA: Diagnosis present

## 2017-09-05 DIAGNOSIS — I10 Essential (primary) hypertension: Secondary | ICD-10-CM | POA: Insufficient documentation

## 2017-09-05 DIAGNOSIS — R079 Chest pain, unspecified: Secondary | ICD-10-CM

## 2017-09-05 DIAGNOSIS — R519 Headache, unspecified: Secondary | ICD-10-CM

## 2017-09-05 LAB — CBC
HCT: 34.5 % — ABNORMAL LOW (ref 35.0–47.0)
HEMOGLOBIN: 11 g/dL — AB (ref 12.0–16.0)
MCH: 25.7 pg — AB (ref 26.0–34.0)
MCHC: 31.9 g/dL — ABNORMAL LOW (ref 32.0–36.0)
MCV: 80.7 fL (ref 80.0–100.0)
Platelets: 283 10*3/uL (ref 150–440)
RBC: 4.28 MIL/uL (ref 3.80–5.20)
RDW: 16 % — ABNORMAL HIGH (ref 11.5–14.5)
WBC: 12.6 10*3/uL — ABNORMAL HIGH (ref 3.6–11.0)

## 2017-09-05 LAB — URINALYSIS, COMPLETE (UACMP) WITH MICROSCOPIC
Bilirubin Urine: NEGATIVE
Glucose, UA: NEGATIVE mg/dL
Hgb urine dipstick: NEGATIVE
Ketones, ur: NEGATIVE mg/dL
Leukocytes, UA: NEGATIVE
Nitrite: NEGATIVE
Protein, ur: NEGATIVE mg/dL
SPECIFIC GRAVITY, URINE: 1.008 (ref 1.005–1.030)
pH: 6 (ref 5.0–8.0)

## 2017-09-05 LAB — BASIC METABOLIC PANEL
ANION GAP: 9 (ref 5–15)
BUN: 11 mg/dL (ref 6–20)
CHLORIDE: 108 mmol/L (ref 101–111)
CO2: 18 mmol/L — AB (ref 22–32)
Calcium: 8.8 mg/dL — ABNORMAL LOW (ref 8.9–10.3)
Creatinine, Ser: 0.64 mg/dL (ref 0.44–1.00)
GFR calc non Af Amer: >60 mL/min (ref >60)
Glucose, Bld: 217 mg/dL — ABNORMAL HIGH (ref 65–99)
Potassium: 3.7 mmol/L (ref 3.5–5.1)
Sodium: 135 mmol/L (ref 135–145)

## 2017-09-05 LAB — LIPASE, BLOOD: Lipase: 27 U/L (ref 11–51)

## 2017-09-05 LAB — URIC ACID: URIC ACID, SERUM: 4.3 mg/dL (ref 2.3–6.6)

## 2017-09-05 LAB — HEPATIC FUNCTION PANEL
ALBUMIN: 2.6 g/dL — AB (ref 3.5–5.0)
ALT: 71 U/L — AB (ref 14–54)
AST: 56 U/L — AB (ref 15–41)
Alkaline Phosphatase: 100 U/L (ref 38–126)
Total Bilirubin: 0.4 mg/dL (ref 0.3–1.2)
Total Protein: 7.2 g/dL (ref 6.5–8.1)

## 2017-09-05 LAB — TROPONIN I

## 2017-09-05 LAB — LACTATE DEHYDROGENASE: LDH: 127 U/L (ref 98–192)

## 2017-09-05 MED ORDER — ADENOSINE 6 MG/2ML IV SOLN
INTRAVENOUS | Status: AC
  Administered 2017-09-05: 01:00:00
  Filled 2017-09-05: qty 2

## 2017-09-05 MED ORDER — ACETAMINOPHEN 325 MG PO TABS
650.0000 mg | ORAL_TABLET | Freq: Once | ORAL | Status: AC
  Administered 2017-09-05: 650 mg via ORAL
  Filled 2017-09-05: qty 2

## 2017-09-05 MED ORDER — HYDRALAZINE HCL 20 MG/ML IJ SOLN
5.0000 mg | Freq: Once | INTRAMUSCULAR | Status: AC
  Administered 2017-09-05: 5 mg via INTRAVENOUS
  Filled 2017-09-05: qty 1

## 2017-09-05 NOTE — ED Provider Notes (Signed)
[facility] Emergency Department Provider Note   ____________________________________________   First MD Initiated Contact with Patient 09/05/17 0032     (approximate)  I have reviewed the triage vital signs and the nursing notes.   HISTORY  Chief Complaint Chest Pain    HPI Lauren [name] is a 41 y.o. female [id number] at [redacted]w[redacted]d followed by [facility] MFM who presents in SVT.  History of similar; makes her fifth episode in less than 2 months.  Currently taking diltiazem.  States she has felt unwell all day, has had persistent generalized headache all day.  Had a scheduled NST at [facility] earlier in the day which was normal per her report.  Has been having some elevated blood pressures at home.  In addition to headache, patient has had some generalized abdominal cramping.  Reports palpitations since midnight associated with chest tightness.  Denies vaginal bleeding.  Denies recent fever, chills, cough, congestion, shortness of breath, nausea, vomiting, dysuria.  Had one soda yesterday.  Denies recent travel or trauma.   Past Medical History:  Diagnosis Date  . Advanced maternal age in multigravida   . Asthma   . Depression   . GERD (gastroesophageal reflux disease)   . [place] multiparity   . Headache   . Hypertension   . Leg fracture   . Obesity, morbid, BMI 40.0-49.9 ([place])   . Post traumatic stress disorder   . Pre-eclampsia   . SVT (supraventricular tachycardia) ([place])    a. prior report of SVT req adenosine;  b. 2011 Holter @ [facility] reportedly showed "irregular heartbeat"; c. 05/2016 Holter: "basically unremarkable" per Dr. Clayborn Bigness;  d. 07/2016 Echo: EF 50-55%. Nl RV fxn.  . Wrist fracture, closed    bilateral    Patient Active Problem List   Diagnosis Date Noted  . SVT (supraventricular tachycardia) ([place]) 10/15/2016  . Elevated blood pressure affecting pregnancy in third trimester, antepartum 10/10/2016  . Chest pain 09/23/2016  . Hypertension complicating  pregnancy 42/70/6237  . Obesity 05/30/2016  . Advanced maternal age in multigravida 05/30/2016  . Depression affecting pregnancy in second trimester, antepartum 05/30/2016    Past Surgical History:  Procedure Laterality Date  . CESAREAN SECTION  2010  . CESAREAN SECTION  2011  . CHOLECYSTECTOMY    . KNEE SURGERY    . ROTATOR CUFF REPAIR    . TONSILLECTOMY      Prior to Admission medications   Medication Sig Start Date End Date Taking? Authorizing Provider  acetaminophen (TYLENOL) 325 MG tablet Take 650 mg by mouth every 6 (six) hours as needed.   Yes [provider]  aspirin EC 81 MG tablet Take 81 mg by mouth daily.   Yes [provider]  diltiazem (CARDIZEM CD) 180 MG 24 hr capsule Take 1 capsule by mouth daily.   Yes [provider]  esomeprazole (NEXIUM) 40 MG capsule Take 40 mg by mouth daily at 12 noon.   Yes [provider]  magnesium oxide (MAG-OX) 400 MG tablet Take 400 mg by mouth daily.   Yes [provider]  Prenat-FeAsp-Meth-FA-DHA w/o A (PRENATE DHA) 18-0.6-0.4-300 MG CAPS Take 1 tablet by mouth daily.   Yes [provider]  tiZANidine (ZANAFLEX) 2 MG tablet Take 2 mg by mouth every 6 (six) hours as needed for headache.   Yes [provider]  topiramate (TOPAMAX) 25 MG tablet Take 25 mg by mouth 2 (two) times daily.   Yes [provider]  naproxen (NAPROSYN) 500 MG  tablet Take 1 tablet (500 mg total) by mouth 2 (two) times daily with a meal. Patient not taking: Reported on 08/14/2017 01/27/17   Lorin Picket, PA-C  Prenat w/o A-FeCbGl-DSS-FA-DHA (CITRANATAL ASSURE) 35-1 & 300 MG tablet TAKE 1 TABLET AND TAKE 1 SOFTGEL DAILY Patient not taking: Reported on 08/14/2017 11/22/16   Dalia Heading, CNM  tiZANidine (ZANAFLEX) 4 MG capsule Take 1 capsule (4 mg total) by mouth 3 (three) times daily. Patient not taking: Reported on 08/14/2017 01/27/17   Lorin Picket, PA-C    Allergies Nucynta  [tapentadol]  Family History  Problem Relation Age of Onset  . Diabetes Sister   . Migraines Maternal Aunt   . Migraines Maternal Uncle   . Migraines Paternal Aunt   . Migraines Paternal Uncle   . Diabetes Sister   . Diabetes Sister   . Diabetes Sister   . Diabetes Sister   . Diabetes Sister   . Ovarian cancer Cousin   . Diabetes Mother   . Diabetes Father   . Migraines Father     Social History Social History   Tobacco Use  . Smoking status: Former Smoker    Types: Cigarettes  . Smokeless tobacco: Never Used  . Tobacco comment: occasional smoker  Substance Use Topics  . Alcohol use: No    Comment: occ.  . Drug use: No    Comment: last used several months ago    Review of Systems  Constitutional: No fever/chills. Eyes: No visual changes. ENT: No sore throat. Cardiovascular: Positive for palpitations and chest pain. Respiratory: Denies shortness of breath. Gastrointestinal: Positive for abdominal cramping.  No nausea, no vomiting.  No diarrhea.  No constipation. Genitourinary: Negative for dysuria. Musculoskeletal: Negative for back pain. Skin: Negative for rash. Neurological: Positive for headache.  Negative for focal weakness or numbness.   ____________________________________________   PHYSICAL EXAM:  VITAL SIGNS: ED Triage Vitals [09/05/17 0022]  Enc Vitals Group     BP (!) 166/137     Pulse Rate (!) 213     Resp (!) 24     Temp 97.9 F (36.6 C)     Temp src      SpO2 100 %     Weight      Height      Head Circumference      Peak Flow      Pain Score 8     Pain Loc      Pain Edu?      Excl. in [place]?     Constitutional: Alert and oriented. Well appearing and in mild acute distress. Eyes: Conjunctivae are normal. PERRL. EOMI. Head: Atraumatic. Nose: No congestion/rhinnorhea. Mouth/Throat: Mucous membranes are moist.  Oropharynx non-erythematous. Neck: No stridor.   Cardiovascular: Tachycardic rate, regular rhythm. Grossly normal heart  sounds.  Good peripheral circulation. Respiratory: Normal respiratory effort.  No retractions. Lungs CTAB. Gastrointestinal: Gravid.  Soft and nontender. No distention. No abdominal bruits. No CVA tenderness. Musculoskeletal: No lower extremity tenderness nor edema.  No joint effusions. Neurologic:  Normal speech and language. No gross focal neurologic deficits are appreciated.  Skin:  Skin is warm, diaphoretic and intact. No rash noted. Psychiatric: Mood and affect are normal. Speech and behavior are normal.  ____________________________________________   LABS (all labs ordered are listed, but only abnormal results are displayed)  Labs Reviewed  BASIC METABOLIC PANEL - Abnormal; Notable for the following components:      Result Value   CO2 18 (*)  Glucose, Bld 217 (*)    Calcium 8.8 (*)    All other components within normal limits  CBC - Abnormal; Notable for the following components:   WBC 12.6 (*)    Hemoglobin 11.0 (*)    HCT 34.5 (*)    MCH 25.7 (*)    MCHC 31.9 (*)    RDW 16.0 (*)    All other components within normal limits  HEPATIC FUNCTION PANEL - Abnormal; Notable for the following components:   Albumin 2.6 (*)    AST 56 (*)    ALT 71 (*)    Bilirubin, Direct <0.1 (*)    All other components within normal limits  TROPONIN I  LIPASE, BLOOD  URIC ACID  LACTATE DEHYDROGENASE  URINALYSIS, COMPLETE (UACMP) WITH MICROSCOPIC   ____________________________________________  EKG  ED ECG REPORT I, Aleyna Cueva J, the attending physician, personally viewed and interpreted this ECG.   Date: 09/05/2017  EKG Time: 0026  Rate: 203  Rhythm: SVT  Axis: Normal  Intervals:none  ST&T Change: Nonspecific  ED ECG REPORT I, Roque Schill J, the attending physician, personally viewed and interpreted this ECG.   Date: 09/05/2017  EKG Time: 0042  Rate: 123  Rhythm: sinus tachycardia  Axis: Normal  Intervals:none  ST&T Change:  Nonspecific   ____________________________________________  RADIOLOGY  No results found.  ____________________________________________   PROCEDURES  Procedure(s) performed: None  Procedures  Critical Care performed: Yes, see critical care note(s)   CRITICAL CARE Performed by: Paulette Blanch   Total critical care time: 45 minutes  Critical care time was exclusive of separately billable procedures and treating other patients.  Critical care was necessary to treat or prevent imminent or life-threatening deterioration.  Critical care was time spent personally by me on the following activities: development of treatment plan with patient and/or surrogate as well as nursing, discussions with consultants, evaluation of patient's response to treatment, examination of patient, obtaining history from patient or surrogate, ordering and performing treatments and interventions, ordering and review of laboratory studies, ordering and review of radiographic studies, pulse oximetry and re-evaluation of patient's condition.  ____________________________________________   INITIAL IMPRESSION / ASSESSMENT AND PLAN / ED COURSE  As part of my medical decision making, I reviewed the following data within the [place] notes reviewed and incorporated, Labs reviewed, EKG interpreted and Notes from prior ED visits.   41 year old female approximately [redacted] weeks pregnant followed at [facility] high risk OB clinic for prior history of preeclampsia, frequent episodes of SVT.  Presents in SVT with a heart rate above 200, BP 166/137.  Differential diagnosis includes but is not limited to CAD, cardiac arrhythmia, preeclampsia, eclampsia, infectious, metabolic etiologies.  Clinical Course as of Sep 05 744  Tue Sep 05, 2017  0047 Good effect with adenosine 12 mg IV.  Currently patient is in sinus tachycardia at the rate of 120s.  Will monitor blood pressure closely.  [JS]  2683 BP 135/86, HR  110  [JS]  0119 BP 153/85, HR 108  [JS]  0157 BP 148/82, c/o generalized headache.  Updated patient on laboratory results.  Will discuss with MFM at [facility].  [JS]  [id number] Spoke with Dr. Wynetta Emery [facility]) who accepts patient in transfer to L&D.  Updated patient and family member who is at bedside.  Blood pressure currently 140/66, heart rate 113.  [JS]  0405 BP 128/75, HR 103 after hydralazine.  EMS arrived to transport patient to [facility].  [JS]    Clinical Course User Index [  JS] Paulette Blanch, MD     ____________________________________________   FINAL CLINICAL IMPRESSION(S) / ED DIAGNOSES  Final diagnoses:  SVT (supraventricular tachycardia) (HCC)  Pre-existing essential hypertension during pregnancy, antepartum  Acute nonintractable headache, unspecified headache type  Chest pain, unspecified type  Pregnancy-induced hypertension in third trimester     ED Discharge Orders    None       Note:  This document was prepared using Dragon voice recognition software and may include unintentional dictation errors.   Paulette Blanch, MD 09/05/17 [id number]

## 2017-09-05 NOTE — ED Triage Notes (Signed)
Patient arrived from home with co CP, with a home pulse ox on her with a HR of 230.  She is diaphoretic and breathing labored.  Pt [redacted] weeks pregnant with hx of pre-eclampsia.

## 2017-09-25 DIAGNOSIS — Z8759 Personal history of other complications of pregnancy, childbirth and the puerperium: Secondary | ICD-10-CM

## 2017-09-25 DIAGNOSIS — Z86718 Personal history of other venous thrombosis and embolism: Secondary | ICD-10-CM | POA: Insufficient documentation

## 2017-12-28 IMAGING — CT CT ANGIO CHEST
1 of 6 series · 19 of 36 positions shown · IV contrast (APPLIED)
Comparison: 12/27/2015 chest radiograph

CLINICAL DATA: 40 y/o F; chest pain and dizziness. Pregnant [REDACTED] weeks.

EXAM:
CT ANGIOGRAPHY CHEST WITH CONTRAST
TECHNIQUE: Multidetector CT imaging of the chest was performed using the
standard protocol during bolus administration of intravenous
contrast. Multiplanar CT image reconstructions and MIPs were
obtained to evaluate the vascular anatomy.
CONTRAST:  75 cc Isovue 370.

[Series 6: thins · axial · 0.66mm/px · z∈[-561,-360]mm · 19 of 225 slices shown]
[im 12/225  lung]
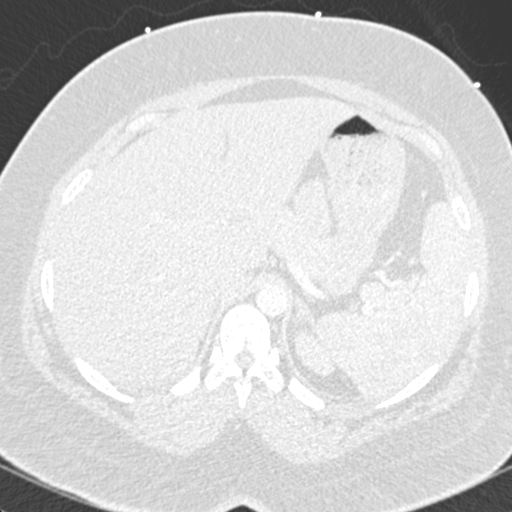
[im 23/225  mediastinal]
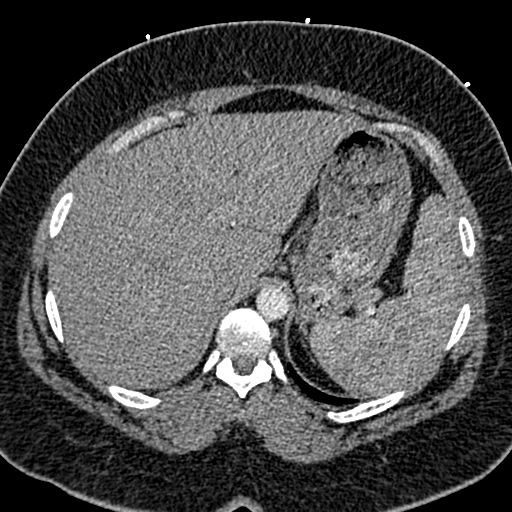
[im 34/225  lung]
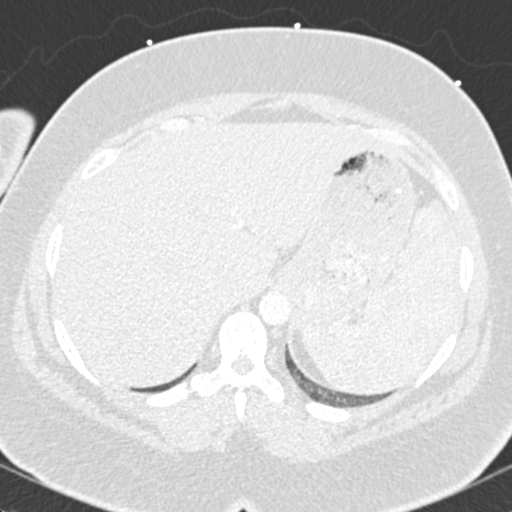
[im 45/225  mediastinal]
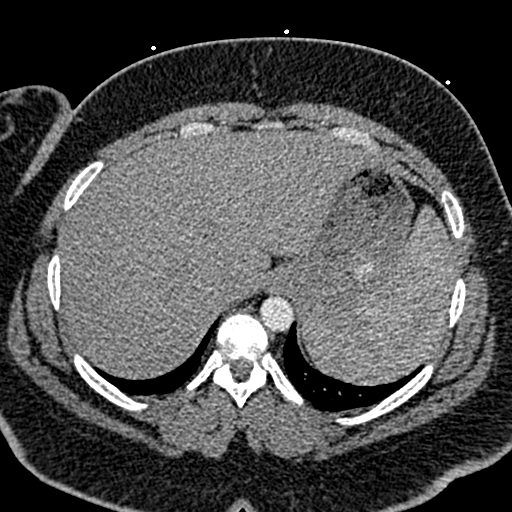
[im 57/225  lung]
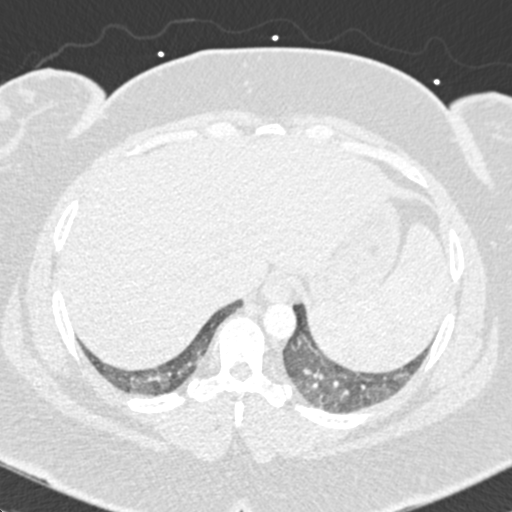
[im 68/225  mediastinal]
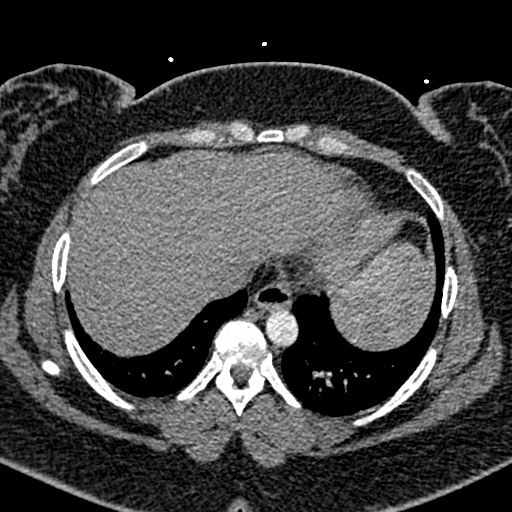
[im 79/225  lung]
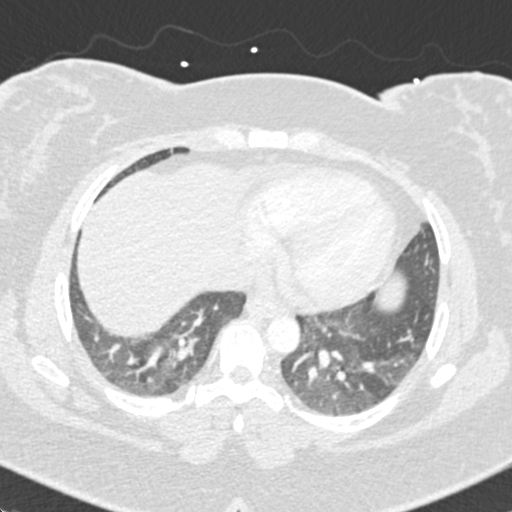
[im 90/225  mediastinal]
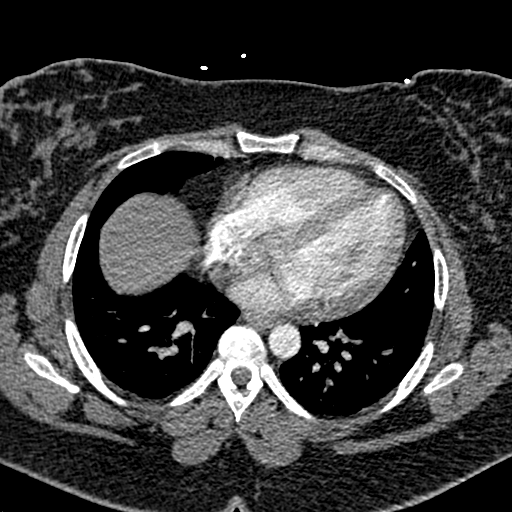
[im 101/225  lung]
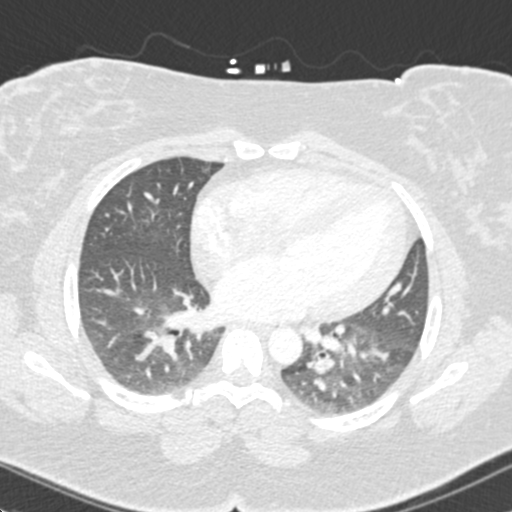
[im 113/225  mediastinal]
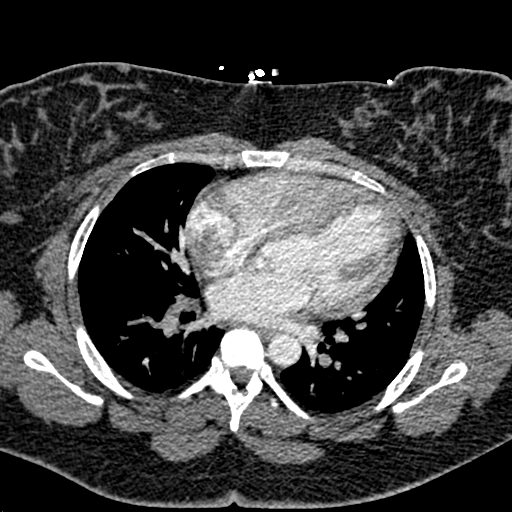
[im 124/225  lung]
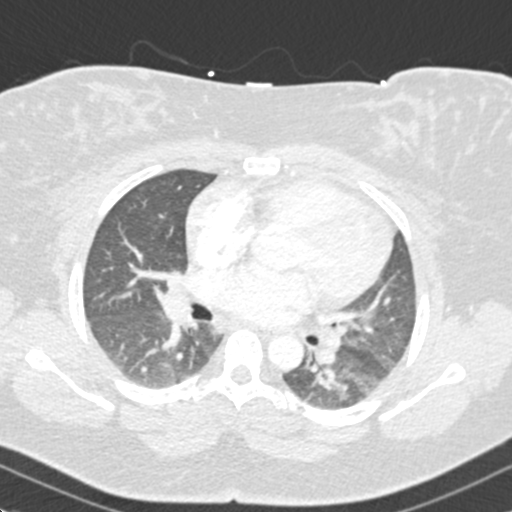
[im 135/225  mediastinal]
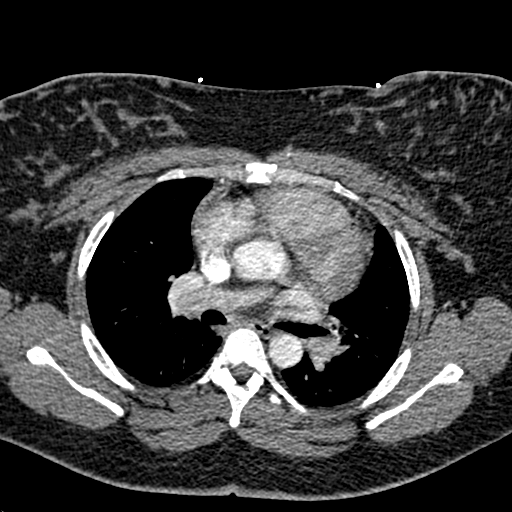
[im 146/225  lung]
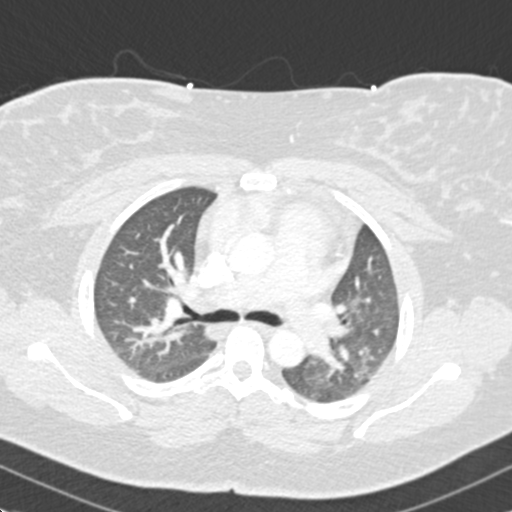
[im 157/225  mediastinal]
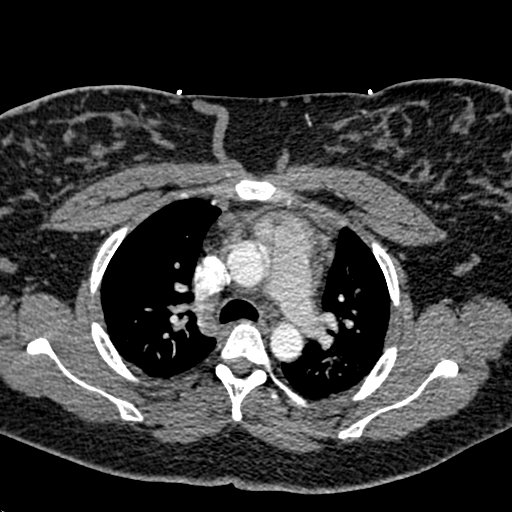
[im 169/225  lung]
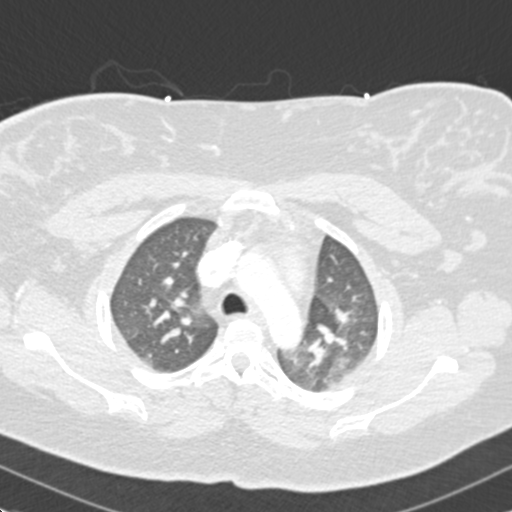
[im 180/225  mediastinal]
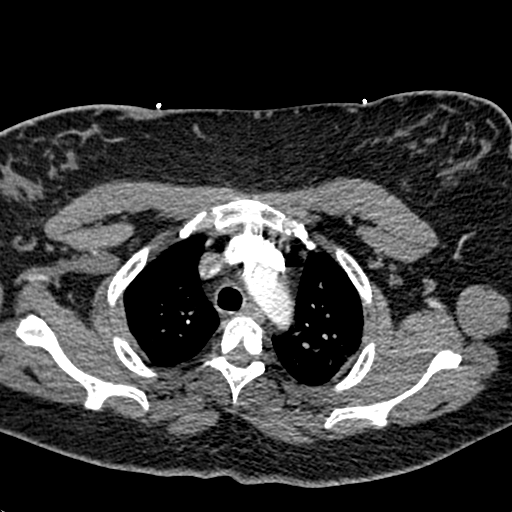
[im 191/225  lung]
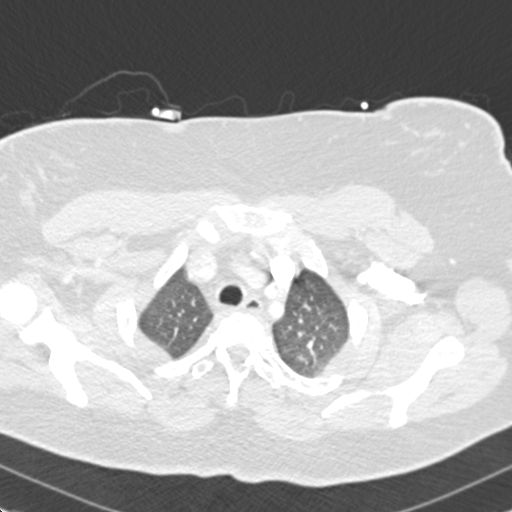
[im 202/225  mediastinal]
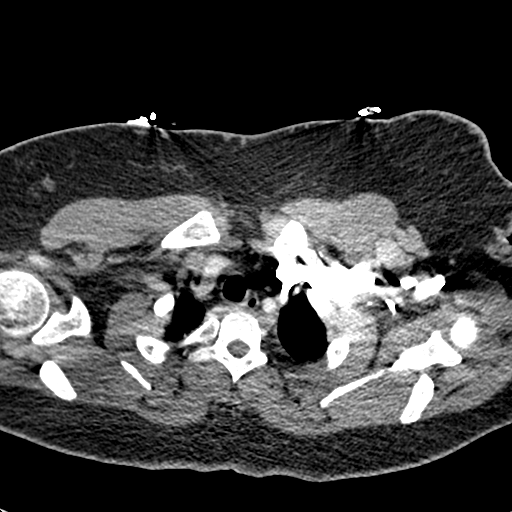
[im 213/225  lung]
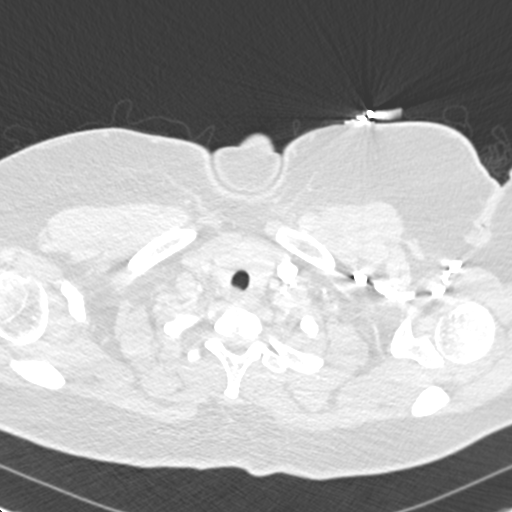

[19 of 36 positions shown; findings below may reference images not displayed]

FINDINGS: Cardiovascular: Preferential opacification of the thoracic aorta. No
evidence of thoracic aortic aneurysm or dissection. Mild cardiac
enlargement is likely due to a hyperdynamic state of pregnancy. No
pericardial effusion. Poor opacification of the pulmonary arteries.
No central pulmonary embolus. No evidence of right heart strain.

Mediastinum/Nodes: No enlarged mediastinal, hilar, or axillary lymph
nodes. Thyroid gland, trachea, and esophagus demonstrate no
significant findings.

Lungs/Pleura: Dependent mosaic attenuation of the lungs is probably
due to air trapping in atelectasis. No evidence for pneumonia or
pleural effusion. No pneumothorax. The

Upper Abdomen: No acute abnormality.

Musculoskeletal: No chest wall abnormality. No acute or significant
osseous findings.

Review of the MIP images confirms the above findings.
IMPRESSION: 1. Poor opacification of pulmonary arteries. No central pulmonary
embolus. No evidence of right heart strain. Suboptimal evaluation
downstream to central pulmonary arteries.
2. Mild cardiac enlargement is likely due to a hyperdynamic state of
pregnancy.
3. No evidence of pneumonia, pleural effusion, or pneumothorax.

By: Adenyo Ologo M.D.

## 2018-01-18 HISTORY — PX: CARDIAC ELECTROPHYSIOLOGY STUDY AND ABLATION: SHX1294

## 2018-01-18 IMAGING — DX DG CHEST 1V
1 series · 1 of 1 positions shown · non-contrast
Comparison: Chest radiograph performed 12/27/2015, and CTA of the
chest performed 09/23/2016

CLINICAL DATA: Acute onset of generalized chest pain radiating to
the left arm. Tachycardia. Initial encounter.

EXAM:
CHEST 1 VIEW

[chest ap]
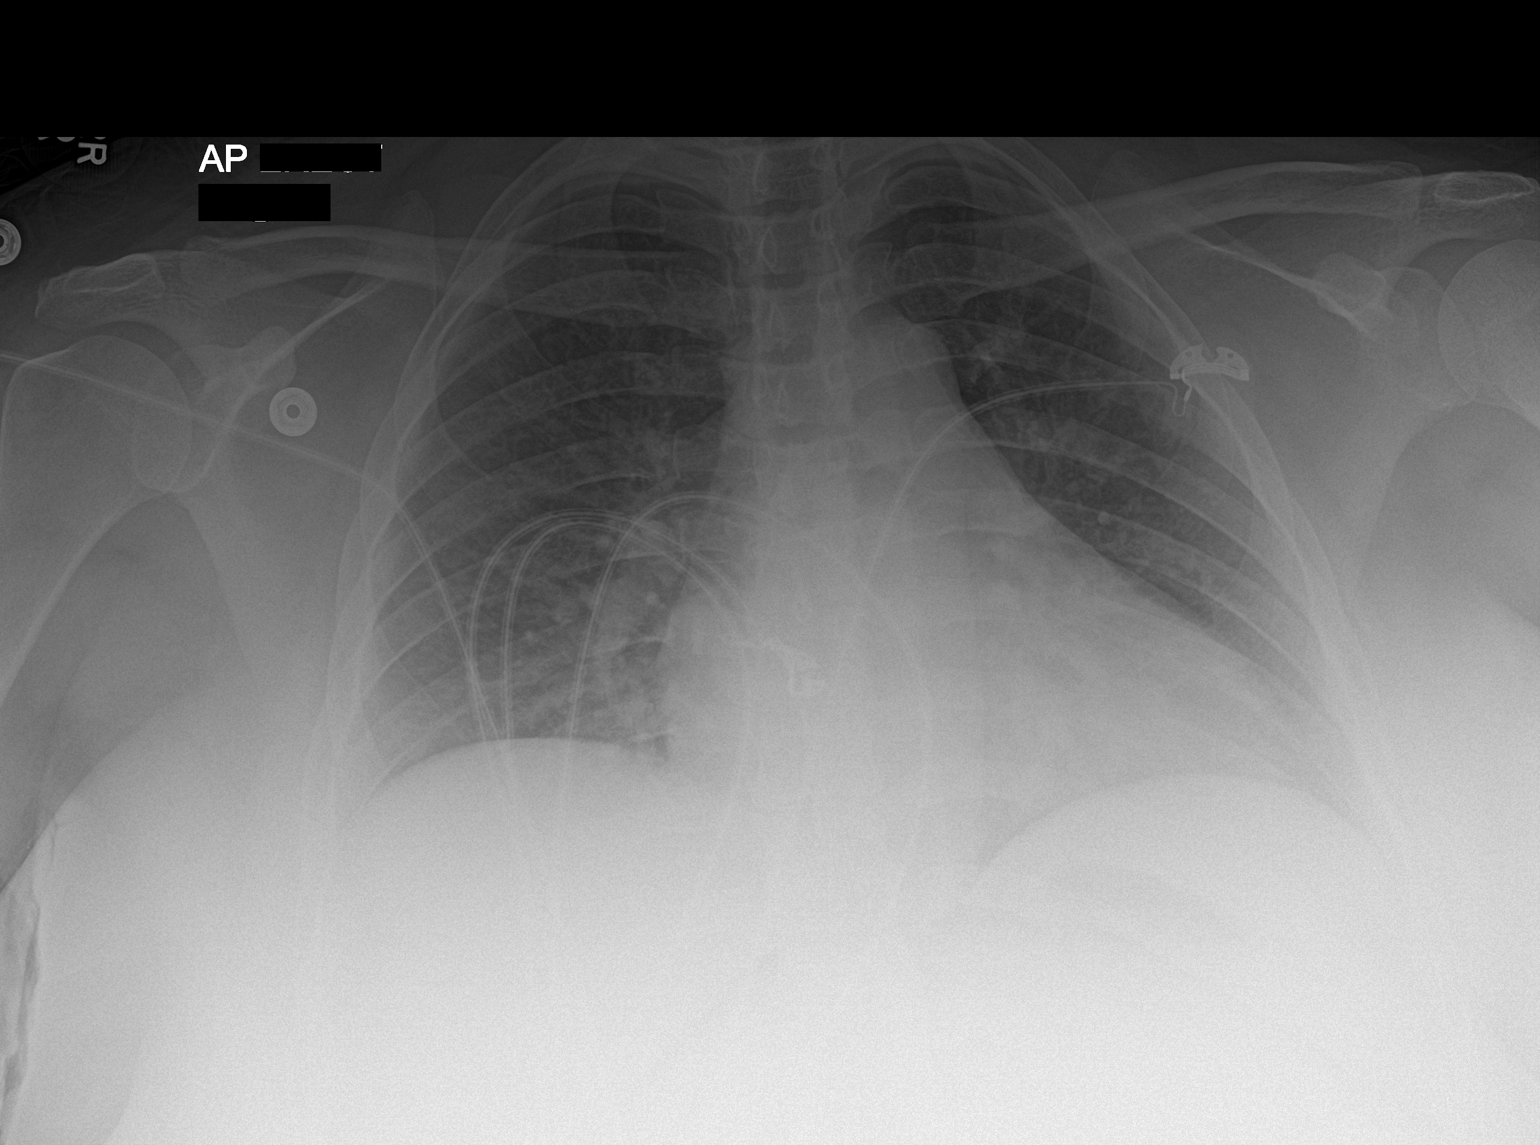

[1 of 1 positions shown; findings below may reference images not displayed]

FINDINGS: The lungs are well-aerated. Mild interstitial prominence is noted.
There is no evidence of pleural effusion or pneumothorax.

The cardiomediastinal silhouette is borderline enlarged. No acute
osseous abnormalities are seen.
IMPRESSION: Mild interstitial prominence; lungs otherwise clear. Borderline
cardiomegaly.

## 2018-03-14 ENCOUNTER — Ambulatory Visit
Admission: RE | Admit: 2018-03-14 | Discharge: 2018-03-14 | Disposition: A | Discharge status: Home / Self Care | Payer: Medicaid Other | Source: Ambulatory Visit | Attending: Nurse Practitioner | Admitting: Nurse Practitioner

## 2018-03-14 ENCOUNTER — Other Ambulatory Visit: Payer: Self-pay

## 2018-03-14 ENCOUNTER — Telehealth: Payer: Self-pay

## 2018-03-14 ENCOUNTER — Encounter: Payer: Self-pay | Admitting: Nurse Practitioner

## 2018-03-14 ENCOUNTER — Ambulatory Visit: Payer: Medicaid Other | Attending: Nurse Practitioner | Admitting: Nurse Practitioner

## 2018-03-14 VITALS — BP 137/86 | HR 85 | Temp 98.3°F | Ht 64.0 in | Wt 338.0 lb

## 2018-03-14 DIAGNOSIS — G894 Chronic pain syndrome: Secondary | ICD-10-CM | POA: Insufficient documentation

## 2018-03-14 DIAGNOSIS — M5441 Lumbago with sciatica, right side: Secondary | ICD-10-CM | POA: Insufficient documentation

## 2018-03-14 DIAGNOSIS — M25511 Pain in right shoulder: Secondary | ICD-10-CM | POA: Diagnosis not present

## 2018-03-14 DIAGNOSIS — Z79899 Other long term (current) drug therapy: Secondary | ICD-10-CM | POA: Diagnosis not present

## 2018-03-14 DIAGNOSIS — Z6841 Body Mass Index (BMI) 40.0 and over, adult: Secondary | ICD-10-CM | POA: Insufficient documentation

## 2018-03-14 DIAGNOSIS — Z79891 Long term (current) use of opiate analgesic: Secondary | ICD-10-CM | POA: Diagnosis not present

## 2018-03-14 DIAGNOSIS — M533 Sacrococcygeal disorders, not elsewhere classified: Secondary | ICD-10-CM | POA: Diagnosis not present

## 2018-03-14 DIAGNOSIS — G8929 Other chronic pain: Secondary | ICD-10-CM | POA: Insufficient documentation

## 2018-03-14 DIAGNOSIS — I10 Essential (primary) hypertension: Secondary | ICD-10-CM | POA: Diagnosis not present

## 2018-03-14 DIAGNOSIS — F329 Major depressive disorder, single episode, unspecified: Secondary | ICD-10-CM | POA: Insufficient documentation

## 2018-03-14 DIAGNOSIS — M25512 Pain in left shoulder: Secondary | ICD-10-CM | POA: Insufficient documentation

## 2018-03-14 DIAGNOSIS — Z87891 Personal history of nicotine dependence: Secondary | ICD-10-CM | POA: Insufficient documentation

## 2018-03-14 DIAGNOSIS — M79604 Pain in right leg: Secondary | ICD-10-CM

## 2018-03-14 DIAGNOSIS — F1190 Opioid use, unspecified, uncomplicated: Secondary | ICD-10-CM | POA: Diagnosis not present

## 2018-03-14 DIAGNOSIS — M5442 Lumbago with sciatica, left side: Secondary | ICD-10-CM | POA: Insufficient documentation

## 2018-03-14 DIAGNOSIS — F4310 Post-traumatic stress disorder, unspecified: Secondary | ICD-10-CM | POA: Diagnosis not present

## 2018-03-14 DIAGNOSIS — M79602 Pain in left arm: Secondary | ICD-10-CM | POA: Insufficient documentation

## 2018-03-14 DIAGNOSIS — M25532 Pain in left wrist: Secondary | ICD-10-CM

## 2018-03-14 DIAGNOSIS — M542 Cervicalgia: Secondary | ICD-10-CM | POA: Insufficient documentation

## 2018-03-14 DIAGNOSIS — M899 Disorder of bone, unspecified: Secondary | ICD-10-CM | POA: Insufficient documentation

## 2018-03-14 DIAGNOSIS — M25562 Pain in left knee: Secondary | ICD-10-CM

## 2018-03-14 DIAGNOSIS — Z9889 Other specified postprocedural states: Secondary | ICD-10-CM | POA: Insufficient documentation

## 2018-03-14 DIAGNOSIS — M79605 Pain in left leg: Secondary | ICD-10-CM

## 2018-03-14 DIAGNOSIS — M25561 Pain in right knee: Secondary | ICD-10-CM

## 2018-03-14 DIAGNOSIS — Z9049 Acquired absence of other specified parts of digestive tract: Secondary | ICD-10-CM | POA: Insufficient documentation

## 2018-03-14 DIAGNOSIS — Z789 Other specified health status: Secondary | ICD-10-CM | POA: Insufficient documentation

## 2018-03-14 NOTE — Patient Instructions (Addendum)
____________________________________________________________________________________________  Appointment Policy Summary  It is our goal and responsibility to provide the medical community with assistance in the evaluation and management of patients with chronic pain. Unfortunately our resources are limited. Because we do not have an unlimited amount of time, or available appointments, we are required to closely monitor and manage their use. The following rules exist to maximize their use:  Patient's responsibilities: 1. Punctuality:  At what time should I arrive? You should be physically present in our office 30 minutes before your scheduled appointment. Your scheduled appointment is with your assigned healthcare provider. However, it takes 5-10 minutes to be "checked-in", and another 15 minutes for the nurses to do the admission. If you arrive to our office at the time you were given for your appointment, you will end up being at least 20-25 minutes late to your appointment with the provider. 2. Tardiness:  What happens if I arrive only a few minutes after my scheduled appointment time? You will need to reschedule your appointment. The cutoff is your appointment time. This is why it is so important that you arrive at least 30 minutes before that appointment. If you have an appointment scheduled for 10:00 AM and you arrive at 10:01, you will be required to reschedule your appointment.  3. Plan ahead:  Always assume that you will encounter traffic on your way in. Plan for it. If you are dependent on a driver, make sure they understand these rules and the need to arrive early. 4. Other appointments and responsibilities:  Avoid scheduling any other appointments before or after your pain clinic appointments.  5. Be prepared:  Write down everything that you need to discuss with your healthcare provider and give this information to the admitting nurse. Write down the medications that you will need  refilled. Bring your pills and bottles (even the empty ones), to all of your appointments, except for those where a procedure is scheduled. 6. No children or pets:  Find someone to take care of them. It is not appropriate to bring them in. 7. Scheduling changes:  We request "advanced notification" of any changes or cancellations. 8. Advanced notification:  Defined as a time period of more than 24 hours prior to the originally scheduled appointment. This allows for the appointment to be offered to other patients. 9. Rescheduling:  When a visit is rescheduled, it will require the cancellation of the original appointment. For this reason they both fall within the category of "Cancellations".  10. Cancellations:  They require advanced notification. Any cancellation less than 24 hours before the  appointment will be recorded as a "No Show". 11. No Show:  Defined as an unkept appointment where the patient failed to notify or declare to the practice their intention or inability to keep the appointment.  Corrective process for repeat offenders:  1. Tardiness: Three (3) episodes of rescheduling due to late arrivals will be recorded as one (1) "No Show". 2. Cancellation or reschedule: Three (3) cancellations or rescheduling will be recorded as one (1) "No Show". 3. "No Shows": Three (3) "No Shows" within a 12 month period will result in discharge from the practice. ____________________________________________________________________________________________  ____________________________________________________________________________________________  Pain Scale  Introduction: The pain score used by this practice is the Verbal Numerical Rating Scale (VNRS-11). This is an 11-point scale. It is for adults and children 10 years or older. There are significant differences in how the pain score is reported, used, and applied. Forget everything you learned in the past and learn  this scoring system.  General  Information: The scale should reflect your current level of pain. Unless you are specifically asked for the level of your worst pain, or your average pain. If you are asked for one of these two, then it should be understood that it is over the past 24 hours.  Basic Activities of Daily Living (ADL): Personal hygiene, dressing, eating, transferring, and using restroom.  Instructions: Most patients tend to report their level of pain as a combination of two factors, their physical pain and their psychosocial pain. This last one is also known as "suffering" and it is reflection of how physical pain affects you socially and psychologically. From now on, report them separately. From this point on, when asked to report your pain level, report only your physical pain. Use the following table for reference.  Pain Clinic Pain Levels (0-5/10)  Pain Level Score  Description  No Pain 0   Mild pain 1 Nagging, annoying, but does not interfere with basic activities of daily living (ADL). Patients are able to eat, bathe, get dressed, toileting (being able to get on and off the toilet and perform personal hygiene functions), transfer (move in and out of bed or a chair without assistance), and maintain continence (able to control bladder and bowel functions). Blood pressure and heart rate are unaffected. A normal heart rate for a healthy adult ranges from 60 to 100 bpm (beats per minute).   Mild to moderate pain 2 Noticeable and distracting. Impossible to hide from other people. More frequent flare-ups. Still possible to adapt and function close to normal. It can be very annoying and may have occasional stronger flare-ups. With discipline, patients may get used to it and adapt.   Moderate pain 3 Interferes significantly with activities of daily living (ADL). It becomes difficult to feed, bathe, get dressed, get on and off the toilet or to perform personal hygiene functions. Difficult to get in and out of bed or a chair  without assistance. Very distracting. With effort, it can be ignored when deeply involved in activities.   Moderately severe pain 4 Impossible to ignore for more than a few minutes. With effort, patients may still be able to manage work or participate in some social activities. Very difficult to concentrate. Signs of autonomic nervous system discharge are evident: dilated pupils (mydriasis); mild sweating (diaphoresis); sleep interference. Heart rate becomes elevated (>115 bpm). Diastolic blood pressure (lower number) rises above 100 mmHg. Patients find relief in laying down and not moving.   Severe pain 5 Intense and extremely unpleasant. Associated with frowning face and frequent crying. Pain overwhelms the senses.  Ability to do any activity or maintain social relationships becomes significantly limited. Conversation becomes difficult. Pacing back and forth is common, as getting into a comfortable position is nearly impossible. Pain wakes you up from deep sleep. Physical signs will be obvious: pupillary dilation; increased sweating; goosebumps; brisk reflexes; cold, clammy hands and feet; nausea, vomiting or dry heaves; loss of appetite; significant sleep disturbance with inability to fall asleep or to remain asleep. When persistent, significant weight loss is observed due to the complete loss of appetite and sleep deprivation.  Blood pressure and heart rate becomes significantly elevated. Caution: If elevated blood pressure triggers a pounding headache associated with blurred vision, then the patient should immediately seek attention at an urgent or emergency care unit, as these may be signs of an impending stroke.    Emergency Department Pain Levels (6-10/10)  Emergency Room Pain 6 Severely   limiting. Requires emergency care and should not be seen or managed at an outpatient pain management facility. Communication becomes difficult and requires great effort. Assistance to reach the emergency department  may be required. Facial flushing and profuse sweating along with potentially dangerous increases in heart rate and blood pressure will be evident.   Distressing pain 7 Self-care is very difficult. Assistance is required to transport, or use restroom. Assistance to reach the emergency department will be required. Tasks requiring coordination, such as bathing and getting dressed become very difficult.   Disabling pain 8 Self-care is no longer possible. At this level, pain is disabling. The individual is unable to do even the most "basic" activities such as walking, eating, bathing, dressing, transferring to a bed, or toileting. Fine motor skills are lost. It is difficult to think clearly.   Incapacitating pain 9 Pain becomes incapacitating. Thought processing is no longer possible. Difficult to remember your own name. Control of movement and coordination are lost.   The worst pain imaginable 10 At this level, most patients pass out from pain. When this level is reached, collapse of the autonomic nervous system occurs, leading to a sudden drop in blood pressure and heart rate. This in turn results in a temporary and dramatic drop in blood flow to the brain, leading to a loss of consciousness. Fainting is one of the body's self defense mechanisms. Passing out puts the brain in a calmed state and causes it to shut down for a while, in order to begin the healing process.    Summary: 1. Refer to this scale when providing [place] with your pain level. 2. Be accurate and careful when reporting your pain level. This will help with your care. 3. Over-reporting your pain level will lead to loss of credibility. 4. Even a level of 1/10 means that there is pain and will be treated at our facility. 5. High, inaccurate reporting will be documented as "Symptom Exaggeration", leading to loss of credibility and suspicions of possible secondary gains such as obtaining more narcotics, or wanting to appear disabled, for  fraudulent reasons. 6. Only pain levels of 5 or below will be seen at our facility. 7. Pain levels of 6 and above will be sent to the Emergency Department and the appointment cancelled. ____________________________________________________________________________________________    BMI Assessment: Estimated body mass index is 58.02 kg/m as calculated from the following:   Height as of this encounter: 5\' 4"  (1.626 m).   Weight as of this encounter: 338 lb (153.3 kg).  BMI interpretation table: BMI level Category Range association with higher incidence of chronic pain  <18 kg/m2 Underweight   18.5-24.9 kg/m2 Ideal body weight   25-29.9 kg/m2 Overweight Increased incidence by 20%  30-34.9 kg/m2 Obese (Class I) Increased incidence by 68%  35-39.9 kg/m2 Severe obesity (Class II) Increased incidence by 136%  >40 kg/m2 Extreme obesity (Class III) Increased incidence by 254%   BMI Readings from Last 4 Encounters:  03/14/18 58.02 kg/m  08/25/17 50.29 kg/m  08/14/17 50.29 kg/m  05/21/17 55.96 kg/m   Wt Readings from Last 4 Encounters:  03/14/18 (!) 338 lb (153.3 kg)  08/25/17 293 lb (132.9 kg)  08/14/17 293 lb (132.9 kg)  05/21/17 (!) 326 lb (147.9 kg)

## 2018-03-14 NOTE — Progress Notes (Addendum)
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 time)       Eyes: PERLA Respiratory: No evidence of acute respiratory distress  Cervical Spine Exam  Inspection: No masses, redness, or swelling Alignment: Symmetrical Functional ROM: Unrestricted ROM      Stability: No instability detected Muscle strength & Tone: Functionally intact Sensory: Unimpaired Palpation: Complains of area being tender to palpation              Upper Extremity (UE) Exam    Side: Right upper extremity  Side: Left upper extremity  Inspection: No masses, redness, swelling, or asymmetry. No contractures  Inspection: No masses, redness, swelling, or asymmetry. No contractures  Functional ROM: Adequate ROM          Functional ROM: Decreased ROM          Muscle strength & Tone: Functionally intact  Muscle strength & Tone: Functionally intact  Sensory: Unimpaired  Sensory: Unimpaired  Palpation: No palpable anomalies              Palpation: No palpable anomalies              Specialized Test(s): Deferred         Specialized Test(s): Deferred          Thoracic Spine Exam  Inspection: No masses, redness, or swelling Alignment:  Symmetrical Functional ROM: Unrestricted ROM Stability: No instability detected Sensory: Unimpaired Muscle strength & Tone: No palpable anomalies  Lumbar Spine Exam  Inspection: No masses, redness, or swelling Alignment: Symmetrical Functional ROM: Unrestricted ROM      Stability: No instability detected Muscle strength & Tone: Functionally intact Sensory: Unimpaired Palpation: Complains of area being tender to palpation  Provocative Tests: Lumbar Hyperextension and rotation test: Non-contributory       Patrick's Maneuver: Unable to perform                    Gait & Posture Assessment  Ambulation: Unassisted Gait: Relatively normal for age and body habitus Posture: WNL   Lower Extremity Exam    Side: Right lower extremity  Side: Left lower extremity  Inspection: No masses, redness, swelling, or asymmetry. No contractures  Inspection: No masses, redness, swelling, or asymmetry. No contractures  Functional ROM: Unrestricted ROM          Functional ROM: Unrestricted ROM          Muscle strength & Tone: Functionally intact  Muscle strength & Tone: Functionally intact  Sensory: Unimpaired  Sensory: Unimpaired  Palpation: No palpable anomalies  Palpation: No palpable anomalies   Assessment  Primary Diagnosis & Pertinent Problem List: The primary encounter diagnosis was Bilateral chronic knee pain. Diagnoses of Wrist pain, chronic, left, Chronic pain of both shoulders, Chronic bilateral low back pain with bilateral sciatica, Chronic pain of left upper extremity, Chronic neck pain, Chronic pain of both lower extremities, Chronic sacroiliac joint pain, Chronic pain syndrome, Opiate use, Pharmacologic therapy, Disorder of skeletal system, and Problems influencing health status were also pertinent to this visit.  Visit Diagnosis: 1. Bilateral chronic knee pain   2. Wrist pain, chronic, left   3. Chronic pain of both shoulders   4. Chronic bilateral low back pain with bilateral  sciatica   5. Chronic pain of left upper extremity   6. Chronic neck pain   7. Chronic pain of both lower extremities   8. Chronic sacroiliac joint pain   9. Chronic pain syndrome   10. Opiate use   11. Pharmacologic therapy   12. Disorder of skeletal system   13. Problems influencing health status    Plan of Care  Initial treatment plan:  Please be advised that as per protocol, today's visit has been an evaluation only. We have not taken over the patient's controlled substance management.  Problem-specific plan: No problem-specific Assessment & Plan notes found for this encounter.  Ordered Lab-work, Procedure(s), Referral(s), & Consult(s): Orders Placed This Encounter  Procedures  . DG Cervical Spine With Flex & Extend  . DG Lumbar Spine Complete W/Bend  . DG Shoulder Left  . DG Si Joints  . Compliance Drug Analysis, Ur  . Comp. Metabolic Panel (12)  . Magnesium  . Vitamin B12  . Sedimentation rate  . 25-Hydroxyvitamin D Lcms D2+D3  . C-reactive protein   Pharmacotherapy: Medications ordered:  No orders of the defined types were placed in this encounter.  Medications administered during this visit: Kharlie N. Guimaraes had no medications administered during this visit.   Pharmacotherapy under consideration:  Opioid Analgesics: The patient was informed that there is no guarantee that she would be a candidate for opioid analgesics. The decision will be made following CDC guidelines. This decision will be based on the results of diagnostic studies, as well as Ms. Boettcher's risk profile.  Membrane stabilizer: To be determined at a later time Muscle relaxant: To be determined at a later time NSAID: To be determined at a later time Other analgesic(s): To be determined at a later time   Interventional therapies under consideration: Ms. Helminiak was informed that there is no guarantee that she would be a candidate for interventional therapies. The decision will be based on the  results of diagnostic studies,  as well as Ms. Obeso's risk profile.  Possible procedure(s): Diagnostic Right Hyalgan series Diagnostic left knee genicular nerve block Diagnostic left intra-articular wrist injection Diagnostic bilateral cervical facet nerve block Diagnostic bilateral cervical epidural steroid injection Possible bilateral cervical facet radiofrequency ablation Diagnostic left intra-articular shoulder injection Diagnostic bilateral suprascapular nerve block Possible bilateral suprascapular nerve radiofrequency ablation Diagnostic bilateral lumbar epidural steroid injection Diagnostic bilateral lumbar facet nerve block Possible bilateral lumbar facet radiofrequency ablation   Provider-requested follow-up: Return for 2nd Visit, w/ Dr. Dossie Arbour, xrays before return for second visit, medical record release.  Future Appointments  Date Time Provider [place]  04/04/2018 10:30 AM Milinda Pointer, MD [place] [facility] None    Primary Care Physician: Patient, No Pcp Per Location: [facility] Note by:  Date: 03/14/2018; Time: 3:20 PM  Pain Score Disclaimer: We use the NRS-11 scale. This is a self-reported, subjective measurement of pain severity with only modest accuracy. It is used primarily to identify changes within a particular patient. It must be understood that outpatient pain scales are significantly less accurate that those used for research, where they can be applied under ideal controlled circumstances with minimal exposure to variables. In reality, the score is likely to be a combination of pain intensity and pain affect, where pain affect describes the degree of emotional arousal or changes in action readiness caused by the sensory experience of pain. Factors such as social and work situation, setting, emotional state, anxiety levels, expectation, and prior pain experience may influence pain perception and show large inter-individual  differences that may also be affected by time variables.  Patient instructions provided during this appointment: Patient Instructions   ____________________________________________________________________________________________  Appointment Policy Summary  It is our goal and responsibility to provide the medical community with assistance in the evaluation and management of patients with chronic pain. Unfortunately our resources are limited. Because we do not have an unlimited amount of time, or available appointments, we are required to closely monitor and manage their use. The following rules exist to maximize their use:  Patient's responsibilities: 1. Punctuality:  At what time should I arrive? You should be physically present in our office 30 minutes before your scheduled appointment. Your scheduled appointment is with your assigned healthcare provider. However, it takes 5-10 minutes to be "checked-in", and another 15 minutes for the nurses to do the admission. If you arrive to our office at the time you were given for your appointment, you will end up being at least 20-25 minutes late to your appointment with the provider. 2. Tardiness:  What happens if I arrive only a few minutes after my scheduled appointment time? You will need to reschedule your appointment. The cutoff is your appointment time. This is why it is so important that you arrive at least 30 minutes before that appointment. If you have an appointment scheduled for 10:00 AM and you arrive at 10:01, you will be required to reschedule your appointment.  3. Plan ahead:  Always assume that you will encounter traffic on your way in. Plan for it. If you are dependent on a driver, make sure they understand these rules and the need to arrive early. 4. Other appointments and responsibilities:  Avoid scheduling any other appointments before or after your pain clinic appointments.  5. Be prepared:  Write down everything that you need to  discuss with your healthcare provider and give this information to the admitting nurse. Write down the medications that you will need refilled. Bring your pills and  bottles (even the empty ones), to all of your appointments, except for those where a procedure is scheduled. 6. No children or pets:  Find someone to take care of them. It is not appropriate to bring them in. 7. Scheduling changes:  We request "advanced notification" of any changes or cancellations. 8. Advanced notification:  Defined as a time period of more than 24 hours prior to the originally scheduled appointment. This allows for the appointment to be offered to other patients. 9. Rescheduling:  When a visit is rescheduled, it will require the cancellation of the original appointment. For this reason they both fall within the category of "Cancellations".  10. Cancellations:  They require advanced notification. Any cancellation less than 24 hours before the  appointment will be recorded as a "No Show". 11. No Show:  Defined as an unkept appointment where the patient failed to notify or declare to the practice their intention or inability to keep the appointment.  Corrective process for repeat offenders:  1. Tardiness: Three (3) episodes of rescheduling due to late arrivals will be recorded as one (1) "No Show". 2. Cancellation or reschedule: Three (3) cancellations or rescheduling will be recorded as one (1) "No Show". 3. "No Shows": Three (3) "No Shows" within a 12 month period will result in discharge from the practice. ____________________________________________________________________________________________  ____________________________________________________________________________________________  Pain Scale  Introduction: The pain score used by this practice is the Verbal Numerical Rating Scale (VNRS-11). This is an 11-point scale. It is for adults and children 10 years or older. There are significant differences in  how the pain score is reported, used, and applied. Forget everything you learned in the past and learn this scoring system.  General Information: The scale should reflect your current level of pain. Unless you are specifically asked for the level of your worst pain, or your average pain. If you are asked for one of these two, then it should be understood that it is over the past 24 hours.  Basic Activities of Daily Living (ADL): Personal hygiene, dressing, eating, transferring, and using restroom.  Instructions: Most patients tend to report their level of pain as a combination of two factors, their physical pain and their psychosocial pain. This last one is also known as "suffering" and it is reflection of how physical pain affects you socially and psychologically. From now on, report them separately. From this point on, when asked to report your pain level, report only your physical pain. Use the following table for reference.  Pain Clinic Pain Levels (0-5/10)  Pain Level Score  Description  No Pain 0   Mild pain 1 Nagging, annoying, but does not interfere with basic activities of daily living (ADL). Patients are able to eat, bathe, get dressed, toileting (being able to get on and off the toilet and perform personal hygiene functions), transfer (move in and out of bed or a chair without assistance), and maintain continence (able to control bladder and bowel functions). Blood pressure and heart rate are unaffected. A normal heart rate for a healthy adult ranges from 60 to 100 bpm (beats per minute).   Mild to moderate pain 2 Noticeable and distracting. Impossible to hide from other people. More frequent flare-ups. Still possible to adapt and function close to normal. It can be very annoying and may have occasional stronger flare-ups. With discipline, patients may get used to it and adapt.   Moderate pain 3 Interferes significantly with activities of daily living (ADL). It becomes difficult to feed,  bathe,  get dressed, get on and off the toilet or to perform personal hygiene functions. Difficult to get in and out of bed or a chair without assistance. Very distracting. With effort, it can be ignored when deeply involved in activities.   Moderately severe pain 4 Impossible to ignore for more than a few minutes. With effort, patients may still be able to manage work or participate in some social activities. Very difficult to concentrate. Signs of autonomic nervous system discharge are evident: dilated pupils (mydriasis); mild sweating (diaphoresis); sleep interference. Heart rate becomes elevated (>115 bpm). Diastolic blood pressure (lower number) rises above 100 mmHg. Patients find relief in laying down and not moving.   Severe pain 5 Intense and extremely unpleasant. Associated with frowning face and frequent crying. Pain overwhelms the senses.  Ability to do any activity or maintain social relationships becomes significantly limited. Conversation becomes difficult. Pacing back and forth is common, as getting into a comfortable position is nearly impossible. Pain wakes you up from deep sleep. Physical signs will be obvious: pupillary dilation; increased sweating; goosebumps; brisk reflexes; cold, clammy hands and feet; nausea, vomiting or dry heaves; loss of appetite; significant sleep disturbance with inability to fall asleep or to remain asleep. When persistent, significant weight loss is observed due to the complete loss of appetite and sleep deprivation.  Blood pressure and heart rate becomes significantly elevated. Caution: If elevated blood pressure triggers a pounding headache associated with blurred vision, then the patient should immediately seek attention at an urgent or emergency care unit, as these may be signs of an impending stroke.    Emergency Department Pain Levels (6-10/10)  Emergency Room Pain 6 Severely limiting. Requires emergency care and should not be seen or managed at an  outpatient pain management facility. Communication becomes difficult and requires great effort. Assistance to reach the emergency department may be required. Facial flushing and profuse sweating along with potentially dangerous increases in heart rate and blood pressure will be evident.   Distressing pain 7 Self-care is very difficult. Assistance is required to transport, or use restroom. Assistance to reach the emergency department will be required. Tasks requiring coordination, such as bathing and getting dressed become very difficult.   Disabling pain 8 Self-care is no longer possible. At this level, pain is disabling. The individual is unable to do even the most "basic" activities such as walking, eating, bathing, dressing, transferring to a bed, or toileting. Fine motor skills are lost. It is difficult to think clearly.   Incapacitating pain 9 Pain becomes incapacitating. Thought processing is no longer possible. Difficult to remember your own name. Control of movement and coordination are lost.   The worst pain imaginable 10 At this level, most patients pass out from pain. When this level is reached, collapse of the autonomic nervous system occurs, leading to a sudden drop in blood pressure and heart rate. This in turn results in a temporary and dramatic drop in blood flow to the brain, leading to a loss of consciousness. Fainting is one of the body's self defense mechanisms. Passing out puts the brain in a calmed state and causes it to shut down for a while, in order to begin the healing process.    Summary: 1. Refer to this scale when providing [place] with your pain level. 2. Be accurate and careful when reporting your pain level. This will help with your care. 3. Over-reporting your pain level will lead to loss of credibility. 4. Even a level of 1/10 means that  there is pain and will be treated at our facility. 5. High, inaccurate reporting will be documented as "Symptom Exaggeration", leading  to loss of credibility and suspicions of possible secondary gains such as obtaining more narcotics, or wanting to appear disabled, for fraudulent reasons. 6. Only pain levels of 5 or below will be seen at our facility. 7. Pain levels of 6 and above will be sent to the Emergency Department and the appointment cancelled. ____________________________________________________________________________________________    BMI Assessment: Estimated body mass index is 58.02 kg/m as calculated from the following:   Height as of this encounter: '5\' 4"'$  (1.626 m).   Weight as of this encounter: 338 lb (153.3 kg).  BMI interpretation table: BMI level Category Range association with higher incidence of chronic pain  <18 kg/m2 Underweight   18.5-24.9 kg/m2 Ideal body weight   25-29.9 kg/m2 Overweight Increased incidence by 20%  30-34.9 kg/m2 Obese (Class I) Increased incidence by 68%  35-39.9 kg/m2 Severe obesity (Class II) Increased incidence by 136%  >40 kg/m2 Extreme obesity (Class III) Increased incidence by 254%   BMI Readings from Last 4 Encounters:  03/14/18 58.02 kg/m  08/25/17 50.29 kg/m  08/14/17 50.29 kg/m  05/21/17 55.96 kg/m   Wt Readings from Last 4 Encounters:  03/14/18 (!) 338 lb (153.3 kg)  08/25/17 293 lb (132.9 kg)  08/14/17 293 lb (132.9 kg)  05/21/17 (!) 326 lb (147.9 kg)

## 2018-03-20 LAB — COMPLIANCE DRUG ANALYSIS, UR

## 2018-03-21 LAB — COMP. METABOLIC PANEL (12)
ALK PHOS: 73 IU/L (ref 39–117)
AST: 20 IU/L (ref 0–40)
Albumin/Globulin Ratio: 1.1 — ABNORMAL LOW (ref 1.2–2.2)
Albumin: 3.9 g/dL (ref 3.5–5.5)
BILIRUBIN TOTAL: 0.2 mg/dL (ref 0.0–1.2)
BUN/Creatinine Ratio: 16 (ref 9–23)
BUN: 12 mg/dL (ref 6–24)
CREATININE: 0.77 mg/dL (ref 0.57–1.00)
Calcium: 9.4 mg/dL (ref 8.7–10.2)
Chloride: 106 mmol/L (ref 96–106)
GFR calc Af Amer: 112 mL/min/{1.73_m2} (ref >59)
GFR, EST NON AFRICAN AMERICAN: 97 mL/min/{1.73_m2} (ref >59)
GLUCOSE: 101 mg/dL — AB (ref 65–99)
Globulin, Total: 3.4 g/dL (ref 1.5–4.5)
Potassium: 4.4 mmol/L (ref 3.5–5.2)
SODIUM: 142 mmol/L (ref 134–144)
Total Protein: 7.3 g/dL (ref 6.0–8.5)

## 2018-03-21 LAB — C-REACTIVE PROTEIN: CRP: 50 mg/L — ABNORMAL HIGH (ref 0–10)

## 2018-03-21 LAB — 25-HYDROXY VITAMIN D LCMS D2+D3: 25-Hydroxy, Vitamin D-3: 32 ng/mL

## 2018-03-21 LAB — 25-HYDROXYVITAMIN D LCMS D2+D3: 25-HYDROXY, VITAMIN D: 32 ng/mL

## 2018-03-21 LAB — VITAMIN B12: VITAMIN B 12: 564 pg/mL (ref 232–1245)

## 2018-03-21 LAB — MAGNESIUM: Magnesium: 1.8 mg/dL (ref 1.6–2.3)

## 2018-03-21 LAB — SEDIMENTATION RATE: SED RATE: 109 mm/h — AB (ref 0–32)

## 2018-04-03 ENCOUNTER — Other Ambulatory Visit: Payer: Self-pay

## 2018-04-03 NOTE — Progress Notes (Signed)
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 intact. No obvious neuro-muscular anomalies detected. Sensory (Neurological): Unimpaired Muscle strength & Tone: No palpable anomalies  Lumbar Spine Area Exam  Skin & Axial Inspection: No masses, redness, or swelling Alignment: Symmetrical Functional ROM: Unrestricted ROM       Stability: No instability detected Muscle Tone/Strength: Functionally intact. No obvious neuro-muscular anomalies detected. Sensory (Neurological): Unimpaired Palpation: No palpable anomalies       Provocative Tests: Lumbar Hyperextension/rotation test: deferred today       Lumbar quadrant test (Kemp's test): deferred today       Lumbar Lateral bending test: deferred today       Patrick's Maneuver: deferred today                   FABER test: deferred today                   Thigh-thrust test: deferred today       S-I  compression test: deferred today       S-I distraction test: deferred today        Gait & Posture Assessment  Ambulation: Unassisted Gait: Relatively normal for age and body habitus Posture: WNL   Lower Extremity Exam    Side: Right lower extremity  Side: Left lower extremity  Stability: No instability observed          Stability: No instability observed          Skin & Extremity Inspection: Skin color, temperature, and hair growth are WNL. No peripheral edema or cyanosis. No masses, redness, swelling, asymmetry, or associated skin lesions. No contractures.  Skin & Extremity Inspection: Skin color, temperature, and hair growth are WNL. No peripheral edema or cyanosis. No masses, redness, swelling, asymmetry, or associated skin lesions. No contractures.  Functional ROM: Decreased ROM for knee joint          Functional ROM: Decreased ROM for knee joint          Muscle Tone/Strength: Functionally intact. No obvious neuro-muscular anomalies detected.  Muscle Tone/Strength: Functionally intact. No obvious neuro-muscular anomalies detected.  Sensory (Neurological): Articular pain pattern  Sensory (Neurological): Articular pain pattern  Palpation: No palpable anomalies  Palpation: No palpable anomalies   Assessment & Plan  Primary Diagnosis & Pertinent Problem List: The primary encounter diagnosis was Chronic pain syndrome. Diagnoses of Bilateral chronic knee pain (Primary Area of Pain) (L>R), Wrist pain, chronic, left (Secondary Area of Pain), Chronic neck pain (Tertiary Area of Pain), Chronic pain of both shoulders (Fourth Area of Pain) (L>R), Fibromyalgia, Chronic generalized pain, Elevated C-reactive protein (CRP), Elevated sed rate, Primary osteoarthritis of both knees, and Chronic musculoskeletal pain were also pertinent to this visit.  Visit Diagnosis: 1. Chronic pain syndrome   2. Bilateral chronic knee pain (Primary Area of Pain) (L>R)   3. Wrist pain, chronic, left (Secondary Area of  Pain)   4. Chronic neck pain (Tertiary Area of Pain)   5. Chronic pain of both shoulders (Fourth Area of Pain) (L>R)   6. Fibromyalgia   7. Chronic generalized pain   8. Elevated C-reactive protein (CRP)   9. Elevated sed rate   10. Primary osteoarthritis of both knees   11. Chronic musculoskeletal pain    Problems updated and reviewed during this visit: Problem  Osteoarthritis of knee (Bilateral)  Cervicalgia  Chronic low back pain (Fifth Area of Pain) (Bilateral) (R>L) w/o sciatica  Chronic wrist pain (Secondary Area of Pain) (Left)  Chronic shoulder pain (Fourth Area of Pain) (Bilateral) (L>R)  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 (Bilateral)  Chronic neck pain (Tertiary Area of Pain) (Bilateral) (L>R)  Chronic knee pain (Primary Area of Pain) (Bilateral) (L>R)    Plan of Care  Pharmacotherapy (Medications Ordered): Meds ordered this encounter  Medications  . meloxicam (MOBIC) 15 MG tablet    Sig: Take 1 tablet (15 mg total) by mouth daily.    Dispense:  30 tablet    Refill:  0    Do not add this medication to the electronic "Automatic Refill" notification system. Patient may have prescription filled one day early if pharmacy is closed on scheduled refill date.  Marland Kitchen tiZANidine (ZANAFLEX) 4 MG tablet    Sig: Take 1 tablet (4 mg total) by mouth every 8 (eight) hours as needed for muscle spasms.    Dispense:  90 tablet    Refill:  0    Do not place this medication, or any other prescription from our practice, on "Automatic Refill". Patient may have prescription filled one day early if pharmacy is closed on scheduled refill date.  . gabapentin (NEURONTIN) 100 MG capsule    Sig: Take 1-3 capsules (100-300 mg total) by mouth at bedtime. Follow written titration schedule.    Dispense:  90 capsule    Refill:  0    Do not place medication on "Automatic Refill". Fill one day early if pharmacy is closed on scheduled refill date.  . diazepam (VALIUM) 5 MG tablet    Sig:  Take 1 tablet (5 mg total) by mouth as needed for up to 2 doses for anxiety (Take one tab 45 minutes before MRI. Take second tablet just prior to MRI scan). Do not take medication within 4 hours of taking opioid pain medications. Must have a driver. Do not drive or operate machinery x 24 hours after taking this medication.    Dispense:  2 tablet    Refill:  0    Must have a driver. Do not drive or operate machinery x 24  hours after taking this medication.    Procedure Orders     KNEE INJECTION  Lab Orders     ANA w/Reflex if Positive  Imaging Orders     MR KNEE LEFT WO CONTRAST     MR KNEE RIGHT WO CONTRAST Referral Orders  No referral(s) requested today    Pharmacological management options:  Opioid Analgesics: I will not be prescribing any opioids at this time Membrane stabilizer: We have discussed the possibility of optimizing this mode of therapy, if tolerated Muscle relaxant: We have discussed the possibility of a trial NSAID: We have discussed the possibility of a trial Other analgesic(s): To be determined at a later time   Interventional management options: Planned, scheduled, and/or pending:    Diagnostic bilateral intra-articular knee injection #1 with local anesthetic and steroid under fluoroscopic guidance, no sedation    Considering:   Diagnostic Right Hyalgan series  Diagnostic left knee genicular nerve block  Diagnostic left intra-articular wrist injection  Diagnostic bilateral cervical facet nerve block  Diagnostic bilateral cervical epidural steroid injection  Possible bilateral cervical facet radiofrequency ablation  Diagnostic left intra-articular shoulder injection  Diagnostic bilateral suprascapular nerve block  Possible bilateral suprascapular nerve radiofrequency ablation  Diagnostic bilateral lumbar epidural steroid injection  Diagnostic bilateral lumbar facet nerve block  Possible bilateral lumbar facet radiofrequency ablation    PRN Procedures:    None at this time   Provider-requested follow-up: Return for Procedure (no sedation): (B) Knee inj (Steroid) (w/ Fuoro).  Future Appointments  Date Time Provider [place]  04/12/2018  9:45 AM Milinda Pointer, MD [facility] None    Primary Care Physician: Patient, No Pcp Per Location: [facility] Note by: Gaspar Cola, MD Date: 04/04/2018; Time: 12:37 PM

## 2018-04-04 ENCOUNTER — Ambulatory Visit: Payer: Medicaid Other | Attending: Pain Medicine | Admitting: Pain Medicine

## 2018-04-04 ENCOUNTER — Other Ambulatory Visit: Payer: Self-pay

## 2018-04-04 ENCOUNTER — Encounter: Payer: Self-pay | Admitting: Pain Medicine

## 2018-04-04 VITALS — BP 128/68 | HR 98 | Temp 98.2°F | Ht 64.0 in | Wt 331.0 lb

## 2018-04-04 DIAGNOSIS — Z7982 Long term (current) use of aspirin: Secondary | ICD-10-CM | POA: Insufficient documentation

## 2018-04-04 DIAGNOSIS — Z87891 Personal history of nicotine dependence: Secondary | ICD-10-CM | POA: Diagnosis not present

## 2018-04-04 DIAGNOSIS — M170 Bilateral primary osteoarthritis of knee: Secondary | ICD-10-CM | POA: Diagnosis not present

## 2018-04-04 DIAGNOSIS — Z6841 Body Mass Index (BMI) 40.0 and over, adult: Secondary | ICD-10-CM | POA: Insufficient documentation

## 2018-04-04 DIAGNOSIS — R7982 Elevated C-reactive protein (CRP): Secondary | ICD-10-CM | POA: Diagnosis not present

## 2018-04-04 DIAGNOSIS — Z791 Long term (current) use of non-steroidal anti-inflammatories (NSAID): Secondary | ICD-10-CM | POA: Insufficient documentation

## 2018-04-04 DIAGNOSIS — M25532 Pain in left wrist: Secondary | ICD-10-CM | POA: Insufficient documentation

## 2018-04-04 DIAGNOSIS — M549 Dorsalgia, unspecified: Secondary | ICD-10-CM | POA: Diagnosis present

## 2018-04-04 DIAGNOSIS — R700 Elevated erythrocyte sedimentation rate: Secondary | ICD-10-CM | POA: Diagnosis not present

## 2018-04-04 DIAGNOSIS — M25511 Pain in right shoulder: Secondary | ICD-10-CM | POA: Insufficient documentation

## 2018-04-04 DIAGNOSIS — F4310 Post-traumatic stress disorder, unspecified: Secondary | ICD-10-CM | POA: Diagnosis not present

## 2018-04-04 DIAGNOSIS — Z8781 Personal history of (healed) traumatic fracture: Secondary | ICD-10-CM | POA: Diagnosis not present

## 2018-04-04 DIAGNOSIS — Z833 Family history of diabetes mellitus: Secondary | ICD-10-CM | POA: Diagnosis not present

## 2018-04-04 DIAGNOSIS — M545 Low back pain: Secondary | ICD-10-CM | POA: Diagnosis not present

## 2018-04-04 DIAGNOSIS — M25561 Pain in right knee: Secondary | ICD-10-CM | POA: Diagnosis not present

## 2018-04-04 DIAGNOSIS — G894 Chronic pain syndrome: Secondary | ICD-10-CM | POA: Diagnosis not present

## 2018-04-04 DIAGNOSIS — M797 Fibromyalgia: Secondary | ICD-10-CM | POA: Insufficient documentation

## 2018-04-04 DIAGNOSIS — M25512 Pain in left shoulder: Secondary | ICD-10-CM

## 2018-04-04 DIAGNOSIS — K219 Gastro-esophageal reflux disease without esophagitis: Secondary | ICD-10-CM | POA: Insufficient documentation

## 2018-04-04 DIAGNOSIS — Z888 Allergy status to other drugs, medicaments and biological substances status: Secondary | ICD-10-CM | POA: Insufficient documentation

## 2018-04-04 DIAGNOSIS — M542 Cervicalgia: Secondary | ICD-10-CM | POA: Insufficient documentation

## 2018-04-04 DIAGNOSIS — M7918 Myalgia, other site: Secondary | ICD-10-CM | POA: Insufficient documentation

## 2018-04-04 DIAGNOSIS — J45909 Unspecified asthma, uncomplicated: Secondary | ICD-10-CM | POA: Diagnosis not present

## 2018-04-04 DIAGNOSIS — R51 Headache: Secondary | ICD-10-CM | POA: Insufficient documentation

## 2018-04-04 DIAGNOSIS — Z79899 Other long term (current) drug therapy: Secondary | ICD-10-CM | POA: Insufficient documentation

## 2018-04-04 DIAGNOSIS — M533 Sacrococcygeal disorders, not elsewhere classified: Secondary | ICD-10-CM | POA: Insufficient documentation

## 2018-04-04 DIAGNOSIS — F329 Major depressive disorder, single episode, unspecified: Secondary | ICD-10-CM | POA: Insufficient documentation

## 2018-04-04 DIAGNOSIS — Z79891 Long term (current) use of opiate analgesic: Secondary | ICD-10-CM | POA: Insufficient documentation

## 2018-04-04 DIAGNOSIS — I10 Essential (primary) hypertension: Secondary | ICD-10-CM | POA: Insufficient documentation

## 2018-04-04 DIAGNOSIS — Z86718 Personal history of other venous thrombosis and embolism: Secondary | ICD-10-CM | POA: Insufficient documentation

## 2018-04-04 DIAGNOSIS — Z9049 Acquired absence of other specified parts of digestive tract: Secondary | ICD-10-CM | POA: Insufficient documentation

## 2018-04-04 DIAGNOSIS — G8929 Other chronic pain: Secondary | ICD-10-CM

## 2018-04-04 DIAGNOSIS — M25562 Pain in left knee: Secondary | ICD-10-CM

## 2018-04-04 DIAGNOSIS — R52 Pain, unspecified: Secondary | ICD-10-CM

## 2018-04-04 MED ORDER — MELOXICAM 15 MG PO TABS: 15.0000 mg | ORAL_TABLET | Freq: Every day | ORAL | 0 refills | Status: DC

## 2018-04-04 MED ORDER — TIZANIDINE HCL 4 MG PO TABS: 4.0000 mg | ORAL_TABLET | Freq: Three times a day (TID) | ORAL | 0 refills | Status: AC | PRN

## 2018-04-04 MED ORDER — GABAPENTIN 100 MG PO CAPS: 100.0000 mg | ORAL_CAPSULE | Freq: Every day | ORAL | 0 refills | Status: DC

## 2018-04-04 MED ORDER — DIAZEPAM 5 MG PO TABS: 5.0000 mg | ORAL_TABLET | ORAL | 0 refills | Status: DC | PRN

## 2018-04-04 NOTE — Patient Instructions (Addendum)
____________________________________________________________________________________________  Pain Scale  Introduction: The pain score used by this practice is the Verbal Numerical Rating Scale (VNRS-11). This is an 11-point scale. It is for adults and children 10 years or older. There are significant differences in how the pain score is reported, used, and applied. Forget everything you learned in the past and learn this scoring system.  General Information: The scale should reflect your current level of pain. Unless you are specifically asked for the level of your worst pain, or your average pain. If you are asked for one of these two, then it should be understood that it is over the past 24 hours.  Basic Activities of Daily Living (ADL): Personal hygiene, dressing, eating, transferring, and using restroom.  Instructions: Most patients tend to report their level of pain as a combination of two factors, their physical pain and their psychosocial pain. This last one is also known as "suffering" and it is reflection of how physical pain affects you socially and psychologically. From now on, report them separately. From this point on, when asked to report your pain level, report only your physical pain. Use the following table for reference.  Pain Clinic Pain Levels (0-5/10)  Pain Level Score  Description  No Pain 0   Mild pain 1 Nagging, annoying, but does not interfere with basic activities of daily living (ADL). Patients are able to eat, bathe, get dressed, toileting (being able to get on and off the toilet and perform personal hygiene functions), transfer (move in and out of bed or a chair without assistance), and maintain continence (able to control bladder and bowel functions). Blood pressure and heart rate are unaffected. A normal heart rate for a healthy adult ranges from 60 to 100 bpm (beats per minute).   Mild to moderate pain 2 Noticeable and distracting. Impossible to hide from other  people. More frequent flare-ups. Still possible to adapt and function close to normal. It can be very annoying and may have occasional stronger flare-ups. With discipline, patients may get used to it and adapt.   Moderate pain 3 Interferes significantly with activities of daily living (ADL). It becomes difficult to feed, bathe, get dressed, get on and off the toilet or to perform personal hygiene functions. Difficult to get in and out of bed or a chair without assistance. Very distracting. With effort, it can be ignored when deeply involved in activities.   Moderately severe pain 4 Impossible to ignore for more than a few minutes. With effort, patients may still be able to manage work or participate in some social activities. Very difficult to concentrate. Signs of autonomic nervous system discharge are evident: dilated pupils (mydriasis); mild sweating (diaphoresis); sleep interference. Heart rate becomes elevated (>115 bpm). Diastolic blood pressure (lower number) rises above 100 mmHg. Patients find relief in laying down and not moving.   Severe pain 5 Intense and extremely unpleasant. Associated with frowning face and frequent crying. Pain overwhelms the senses.  Ability to do any activity or maintain social relationships becomes significantly limited. Conversation becomes difficult. Pacing back and forth is common, as getting into a comfortable position is nearly impossible. Pain wakes you up from deep sleep. Physical signs will be obvious: pupillary dilation; increased sweating; goosebumps; brisk reflexes; cold, clammy hands and feet; nausea, vomiting or dry heaves; loss of appetite; significant sleep disturbance with inability to fall asleep or to remain asleep. When persistent, significant weight loss is observed due to the complete loss of appetite and sleep deprivation.  Blood   pressure and heart rate becomes significantly elevated. Caution: If elevated blood pressure triggers a pounding headache  associated with blurred vision, then the patient should immediately seek attention at an urgent or emergency care unit, as these may be signs of an impending stroke.    Emergency Department Pain Levels (6-10/10)  Emergency Room Pain 6 Severely limiting. Requires emergency care and should not be seen or managed at an outpatient pain management facility. Communication becomes difficult and requires great effort. Assistance to reach the emergency department may be required. Facial flushing and profuse sweating along with potentially dangerous increases in heart rate and blood pressure will be evident.   Distressing pain 7 Self-care is very difficult. Assistance is required to transport, or use restroom. Assistance to reach the emergency department will be required. Tasks requiring coordination, such as bathing and getting dressed become very difficult.   Disabling pain 8 Self-care is no longer possible. At this level, pain is disabling. The individual is unable to do even the most "basic" activities such as walking, eating, bathing, dressing, transferring to a bed, or toileting. Fine motor skills are lost. It is difficult to think clearly.   Incapacitating pain 9 Pain becomes incapacitating. Thought processing is no longer possible. Difficult to remember your own name. Control of movement and coordination are lost.   The worst pain imaginable 10 At this level, most patients pass out from pain. When this level is reached, collapse of the autonomic nervous system occurs, leading to a sudden drop in blood pressure and heart rate. This in turn results in a temporary and dramatic drop in blood flow to the brain, leading to a loss of consciousness. Fainting is one of the body's self defense mechanisms. Passing out puts the brain in a calmed state and causes it to shut down for a while, in order to begin the healing process.    Summary: 1. Refer to this scale when providing [place] with your pain level. 2. Be  accurate and careful when reporting your pain level. This will help with your care. 3. Over-reporting your pain level will lead to loss of credibility. 4. Even a level of 1/10 means that there is pain and will be treated at our facility. 5. High, inaccurate reporting will be documented as "Symptom Exaggeration", leading to loss of credibility and suspicions of possible secondary gains such as obtaining more narcotics, or wanting to appear disabled, for fraudulent reasons. 6. Only pain levels of 5 or below will be seen at our facility. 7. Pain levels of 6 and above will be sent to the Emergency Department and the appointment cancelled. ____________________________________________________________________________________________   ____________________________________________________________________________________________  Initial Gabapentin Titration  Medication used: Gabapentin (Generic Name) or Neurontin (Brand Name) 100 mg tablets/capsules  Reasons to stop increasing the dose:  Reason 1: You get good relief of symptoms, in which case there is no need to increase the daily dose any further.    Reason 2: You develop some side effects, such as sleeping all of the time, difficulty concentrating, or becoming disoriented, in which case you need to go down on the dose, to the prior level, where you were not experiencing any side effects. Stay on that dose longer, to allow more time for your body to get use it, before attempting to increase it again.   Steps: Step 1: Start by taking 1 (one) tablet at bedtime x 7 (seven) days.  Step 2: After being on 1 (one) tablet for 7 (seven) days, then increase it to  2 (two) tablets at bedtime for another 7 (seven) days.  Step 3: Next, after being on 2 (two) tablets at bedtime for 7 (seven) days, then increase it to 3 (three) tablets at bedtime, and stay on that dose until you see your doctor.  Reasons to stop increasing the dose: Reason 1: You get good  relief of symptoms, in which case there is no need to increase the daily dose any further.  Reason 2: You develop some side effects, such as sleeping all of the time, difficulty concentrating, or becoming disoriented, in which case you need to go down on the dose, to the prior level, where you were not experiencing any side effects. Stay on that dose longer, to allow more time for your body to get use it, before attempting to increase it again.  Endpoint: Once you have reached the maximum dose you can tolerate without side-effects, contact your physician so as to evaluate the results of the regimen.   Questions: Feel free to contact us for any questions or problems at [phone] ____________________________________________________________________________________________ ____________________________________________________________________________________________  Preparing for your procedure (without sedation)  Instructions: . Oral Intake: Do not eat or drink anything for at least 3 hours prior to your procedure. . Transportation: Unless otherwise stated by your physician, you may drive yourself after the procedure. . Blood Pressure Medicine: Take your blood pressure medicine with a sip of water the morning of the procedure. . Blood thinners: Notify our staff if you are taking any blood thinners. Depending on which one you take, there will be specific instructions on how and when to stop it. . Diabetics on insulin: Notify the staff so that you can be scheduled 1st case in the morning. If your diabetes requires high dose insulin, take only  of your normal insulin dose the morning of the procedure and notify the staff that you have done so. . Preventing infections: Shower with an antibacterial soap the morning of your procedure.  . Build-up your immune system: Take 1000 mg of Vitamin C with every meal (3 times a day) the day prior to your procedure. Marland Kitchen Antibiotics: Inform the staff if you have a  condition or reason that requires you to take antibiotics before dental procedures. . Pregnancy: If you are pregnant, call and cancel the procedure. . Sickness: If you have a cold, fever, or any active infections, call and cancel the procedure. . Arrival: You must be in the facility at least 30 minutes prior to your scheduled procedure. . Children: Do not bring any children with you. . Dress appropriately: Bring dark clothing that you would not mind if they get stained. . Valuables: Do not bring any jewelry or valuables.  Procedure appointments are reserved for interventional treatments only. Marland Kitchen No Prescription Refills. . No medication changes will be discussed during procedure appointments. . No disability issues will be discussed.  Reasons to call and reschedule or cancel your procedure: (Following these recommendations will minimize the risk of a serious complication.) . Surgeries: Avoid having procedures within 2 weeks of any surgery. (Avoid for 2 weeks before or after any surgery). . Flu Shots: Avoid having procedures within 2 weeks of a flu shots or . (Avoid for 2 weeks before or after immunizations). . Barium: Avoid having a procedure within 7-10 days after having had a radiological study involving the use of radiological contrast. (Myelograms, Barium swallow or enema study). . Heart attacks: Avoid any elective procedures or surgeries for the initial 6 months after a "Myocardial Infarction" (  Heart Attack). . Blood thinners: It is imperative that you stop these medications before procedures. Let us know if you if you take any blood thinner.  . Infection: Avoid procedures during or within two weeks of an infection (including chest colds or gastrointestinal problems). Symptoms associated with infections include: Localized redness, fever, chills, night sweats or profuse sweating, burning sensation when voiding, cough, congestion, stuffiness, runny nose, sore throat, diarrhea, nausea, vomiting,  cold or Flu symptoms, recent or current infections. It is specially important if the infection is over the area that we intend to treat. Marland Kitchen Heart and lung problems: Symptoms that may suggest an active cardiopulmonary problem include: cough, chest pain, breathing difficulties or shortness of breath, dizziness, ankle swelling, uncontrolled high or unusually low blood pressure, and/or palpitations. If you are experiencing any of these symptoms, cancel your procedure and contact your primary care physician for an evaluation.  Remember:  Regular Business hours are:  Monday to Thursday 8:00 AM to 4:00 PM  Provider's Schedule: Milinda Pointer, MD:  Procedure days: Tuesday and Thursday 7:30 AM to 4:00 PM  Gillis Santa, MD:  Procedure days: Monday and Wednesday 7:30 AM to 4:00 PM ____________________________________________________________________________________________

## 2018-04-10 ENCOUNTER — Ambulatory Visit: Payer: Medicaid Other | Admitting: Pain Medicine

## 2018-04-12 ENCOUNTER — Ambulatory Visit
Admission: RE | Admit: 2018-04-12 | Discharge: 2018-04-12 | Disposition: A | Discharge status: Home / Self Care | Payer: Medicaid Other | Source: Ambulatory Visit | Attending: Pain Medicine | Admitting: Pain Medicine

## 2018-04-12 ENCOUNTER — Encounter: Payer: Self-pay | Admitting: Pain Medicine

## 2018-04-12 ENCOUNTER — Ambulatory Visit: Payer: Medicaid Other | Admitting: Pain Medicine

## 2018-04-12 VITALS — BP 142/61 | HR 80 | Temp 98.6°F | Resp 21 | Ht 64.0 in | Wt 330.0 lb

## 2018-04-12 DIAGNOSIS — Z791 Long term (current) use of non-steroidal anti-inflammatories (NSAID): Secondary | ICD-10-CM | POA: Insufficient documentation

## 2018-04-12 DIAGNOSIS — M25561 Pain in right knee: Secondary | ICD-10-CM | POA: Diagnosis present

## 2018-04-12 DIAGNOSIS — Z79899 Other long term (current) drug therapy: Secondary | ICD-10-CM | POA: Insufficient documentation

## 2018-04-12 DIAGNOSIS — M25562 Pain in left knee: Secondary | ICD-10-CM

## 2018-04-12 DIAGNOSIS — Z888 Allergy status to other drugs, medicaments and biological substances status: Secondary | ICD-10-CM | POA: Diagnosis not present

## 2018-04-12 DIAGNOSIS — Z7982 Long term (current) use of aspirin: Secondary | ICD-10-CM | POA: Diagnosis not present

## 2018-04-12 DIAGNOSIS — Z9049 Acquired absence of other specified parts of digestive tract: Secondary | ICD-10-CM | POA: Insufficient documentation

## 2018-04-12 DIAGNOSIS — M545 Low back pain: Secondary | ICD-10-CM

## 2018-04-12 DIAGNOSIS — M170 Bilateral primary osteoarthritis of knee: Secondary | ICD-10-CM | POA: Insufficient documentation

## 2018-04-12 DIAGNOSIS — M542 Cervicalgia: Secondary | ICD-10-CM | POA: Insufficient documentation

## 2018-04-12 DIAGNOSIS — G8929 Other chronic pain: Secondary | ICD-10-CM | POA: Diagnosis not present

## 2018-04-12 DIAGNOSIS — Z6841 Body Mass Index (BMI) 40.0 and over, adult: Secondary | ICD-10-CM | POA: Diagnosis not present

## 2018-04-12 MED ORDER — LIDOCAINE HCL (PF) 1 % IJ SOLN
10.0000 mL | Freq: Once | INTRAMUSCULAR | Status: AC
  Administered 2018-04-12: 10 mL
  Filled 2018-04-12: qty 10

## 2018-04-12 MED ORDER — METHYLPREDNISOLONE ACETATE 80 MG/ML IJ SUSP
80.0000 mg | Freq: Once | INTRAMUSCULAR | Status: AC
  Administered 2018-04-12: 80 mg via INTRA_ARTICULAR
  Filled 2018-04-12: qty 1

## 2018-04-12 MED ORDER — ROPIVACAINE HCL 2 MG/ML IJ SOLN
4.0000 mL | Freq: Once | INTRAMUSCULAR | Status: AC
  Administered 2018-04-12: 4 mL via INTRA_ARTICULAR
  Filled 2018-04-12: qty 10

## 2018-04-12 MED ORDER — METHYLPREDNISOLONE ACETATE 80 MG/ML IJ SUSP
INTRAMUSCULAR | Status: AC
  Filled 2018-04-12: qty 1

## 2018-04-12 NOTE — Patient Instructions (Addendum)
____________________________________________________________________________________________  Post-Procedure Discharge Instructions  Instructions:  Apply ice: Fill a plastic sandwich bag with crushed ice. Cover it with a small towel and apply to injection site. Apply for 15 minutes then remove x 15 minutes. Repeat sequence on day of procedure, until you go to bed. The purpose is to minimize swelling and discomfort after procedure.  Apply heat: Apply heat to procedure site starting the day following the procedure. The purpose is to treat any soreness and discomfort from the procedure.  Food intake: Start with clear liquids (like water) and advance to regular food, as tolerated.   Physical activities: Keep activities to a minimum for the first 8 hours after the procedure.   Driving: If you have received any sedation, you are not allowed to drive for 24 hours after your procedure.  Blood thinner: Restart your blood thinner 6 hours after your procedure. (Only for those taking blood thinners)  Insulin: As soon as you can eat, you may resume your normal dosing schedule. (Only for those taking insulin)  Infection prevention: Keep procedure site clean and dry.  Post-procedure Pain Diary: Extremely important that this be done correctly and accurately. Recorded information will be used to determine the next step in treatment.  Pain evaluated is that of treated area only. Do not include pain from an untreated area.  Complete every hour, on the hour, for the initial 8 hours. Set an alarm to help you do this part accurately.  Do not go to sleep and have it completed later. It will not be accurate.  Follow-up appointment: Keep your follow-up appointment after the procedure. Usually 2 weeks for most procedures. (6 weeks in the case of radiofrequency.) Bring you pain diary.   Expect:  From numbing medicine (AKA: Local Anesthetics): Numbness or decrease in pain.  Onset: Full effect within 15  minutes of injected.  Duration: It will depend on the type of local anesthetic used. On the average, 1 to 8 hours.   From steroids: Decrease in swelling or inflammation. Once inflammation is improved, relief of the pain will follow.  Onset of benefits: Depends on the amount of swelling present. The more swelling, the longer it will take for the benefits to be seen. In some cases, up to 10 days.  Duration: Steroids will stay in the system x 2 weeks. Duration of benefits will depend on multiple posibilities including persistent irritating factors.  From procedure: Some discomfort is to be expected once the numbing medicine wears off. This should be minimal if ice and heat are applied as instructed.  Call if:  You experience numbness and weakness that gets worse with time, as opposed to wearing off.  New onset bowel or bladder incontinence. (This applies to Spinal procedures only)  Emergency Numbers:  Durning business hours (Monday - Thursday, 8:00 AM - 4:00 PM) (Friday, 9:00 AM - 12:00 Noon): [phone]  After hours: [phone] ____________________________________________________________________________________________    Knee Injection A knee injection is a procedure to get medicine into your knee joint. Your health care provider puts a needle into the joint and injects medicine with an attached syringe. The injected medicine may relieve the pain, swelling, and stiffness of arthritis. The injected medicine may also help to lubricate and cushion your knee joint. You may need more than one injection. Tell a health care provider about:  Any allergies you have.  All medicines you are taking, including vitamins, herbs, eye drops, creams, and over-the-counter medicines.  Any problems you or family members have   had with anesthetic medicines.  Any blood disorders you have.  Any surgeries you have had.  Any medical conditions you have. What are the risks? Generally, this is a  safe procedure. However, problems may occur, including:  Infection.  Bleeding.  Worsening symptoms.  Damage to the area around your knee.  Allergic reaction to any of the medicines.  Skin reactions from repeated injections.  What happens before the procedure?  Ask your health care provider about changing or stopping your regular medicines. This is especially important if you are taking diabetes medicines or blood thinners.  Plan to have someone take you home after the procedure. What happens during the procedure?  You will sit or lie down in a position for your knee to be treated.  The skin over your kneecap will be cleaned with a germ-killing solution (antiseptic).  You will be given a medicine that numbs the area (local anesthetic). You may feel some stinging.  After your knee becomes numb, you will have a second injection. This is the medicine. This needle is carefully placed between your kneecap and your knee. The medicine is injected into the joint space.  At the end of the procedure, the needle will be removed.  A bandage (dressing) may be placed over the injection site. The procedure may vary among health care providers and hospitals. What happens after the procedure?  You may have to move your knee through its full range of motion. This helps to get all of the medicine into your joint space.  Your blood pressure, heart rate, breathing rate, and blood oxygen level will be monitored often until the medicines you were given have worn off.  You will be watched to make sure that you do not have a reaction to the injected medicine. This information is not intended to replace advice given to you by your health care provider. Make sure you discuss any questions you have with your health care provider. Document Released: 11/20/2006 Document Revised: 01/29/2016 Document Reviewed: 07/09/2014 Elsevier Interactive Patient Education  2018 [employer]  

## 2018-04-12 NOTE — Progress Notes (Signed)
Patient's Name: Lauren [name]  MRN: [id number]  Referring Provider: No ref. provider found  DOB: 11/12/1975  PCP: Patient, No Pcp Per  DOS: 04/12/2018  Note by: Gaspar Cola, MD  Service setting: Ambulatory outpatient  Specialty: Interventional Pain Management  Patient type: Established  Location: [facility] (AMB) Pain Management Facility  Visit type: Interventional Procedure   Primary Reason for Visit: Interventional Pain Management Treatment. CC: Knee Pain (left and right ) and Back Pain (lower lumbar bilateral )  Procedure:          Anesthesia, Analgesia, Anxiolysis:  Type: Diagnostic Intra-Articular Local anesthetic and steroid Knee Injection #1  Region: Lateral infrapatellar Knee Region Level: Knee Joint Laterality: Bilateral  Type: Local Anesthesia Indication(s): Analgesia         Local Anesthetic: Lidocaine 1-2% Route: Infiltration (Seffner/IM) IV Access: Declined Sedation: Declined    Indications: 1. Osteoarthritis of knee (Bilateral)   2. Chronic knee pain (Primary Area of Pain) (Bilateral) (L>R)    Pain Score: Pre-procedure: 7 /10 Post-procedure: 0-No pain(0 right knee, 5 left knee)/10  Pre-op Assessment:  Lauren [name] is a 42 y.o. (year old), female patient, seen today for interventional treatment. She  has a past surgical history that includes Rotator cuff repair; Knee surgery; Cholecystectomy; Cesarean section (2010); Cesarean section (2011); Tonsillectomy; and Cardiac electrophysiology study and ablation (01/18/2018). Lauren [name] has a current medication list which includes the following prescription(s): acetaminophen, aspirin ec, vitamin d-3, diazepam, diltiazem, esomeprazole, gabapentin, ibuprofen, meloxicam, tizanidine, furosemide, medroxyprogesterone acetate, and stiolto respimat. Her primarily concern today is the Knee Pain (left and right ) and Back Pain (lower lumbar bilateral )  Initial Vital Signs:  Pulse/HCG Rate: 88  Temp: 98.6 F (37 C) Resp: 16 BP:  125/74 SpO2: 98 %  BMI: Estimated body mass index is 56.64 kg/m as calculated from the following:   Height as of this encounter: 5\' 4"  (1.626 m).   Weight as of this encounter: 330 lb (149.7 kg).  Risk Assessment: Allergies: Reviewed. She is allergic to nucynta [tapentadol].  Allergy Precautions: None required Coagulopathies: Reviewed. None identified.  Blood-thinner therapy: None at this time Active Infection(s): Reviewed. None identified. Lauren [name] is afebrile  Site Confirmation: Lauren [name] was asked to confirm the procedure and laterality before marking the site Procedure checklist: Completed Consent: Before the procedure and under the influence of no sedative(s), amnesic(s), or anxiolytics, the patient was informed of the treatment options, risks and possible complications. To fulfill our ethical and legal obligations, as recommended by the American Medical Association's Code of Ethics, I have informed the patient of my clinical impression; the nature and purpose of the treatment or procedure; the risks, benefits, and possible complications of the intervention; the alternatives, including doing nothing; the risk(s) and benefit(s) of the alternative treatment(s) or procedure(s); and the risk(s) and benefit(s) of doing nothing. The patient was provided information about the general risks and possible complications associated with the procedure. These may include, but are not limited to: failure to achieve desired goals, infection, bleeding, organ or nerve damage, allergic reactions, paralysis, and death. In addition, the patient was informed of those risks and complications associated to the procedure, such as failure to decrease pain; infection; bleeding; organ or nerve damage with subsequent damage to sensory, motor, and/or autonomic systems, resulting in permanent pain, numbness, and/or weakness of one or several areas of the body; allergic reactions; (i.e.: anaphylactic reaction);  and/or death. Furthermore, the patient was informed of those risks and complications associated with the medications.  These include, but are not limited to: allergic reactions (i.e.: anaphylactic or anaphylactoid reaction(s)); adrenal axis suppression; blood sugar elevation that in diabetics may result in ketoacidosis or comma; water retention that in patients with history of congestive heart failure may result in shortness of breath, pulmonary edema, and decompensation with resultant heart failure; weight gain; swelling or edema; medication-induced neural toxicity; particulate matter embolism and blood vessel occlusion with resultant organ, and/or nervous system infarction; and/or aseptic necrosis of one or more joints. Finally, the patient was informed that Medicine is not an exact science; therefore, there is also the possibility of unforeseen or unpredictable risks and/or possible complications that may result in a catastrophic outcome. The patient indicated having understood very clearly. We have given the patient no guarantees and we have made no promises. Enough time was given to the patient to ask questions, all of which were answered to the patient's satisfaction. Lauren [name] has indicated that she wanted to continue with the procedure. Attestation: I, the ordering provider, attest that I have discussed with the patient the benefits, risks, side-effects, alternatives, likelihood of achieving goals, and potential problems during recovery for the procedure that I have provided informed consent. Date  Time: 04/12/2018  9:54 AM  Pre-Procedure Preparation:  Monitoring: As per clinic protocol. Respiration, ETCO2, SpO2, BP, heart rate and rhythm monitor placed and checked for adequate function Safety Precautions: Patient was assessed for positional comfort and pressure points before starting the procedure. Time-out: I initiated and conducted the "Time-out" before starting the procedure, as per protocol.  The patient was asked to participate by confirming the accuracy of the "Time Out" information. Verification of the correct person, site, and procedure were performed and confirmed by me, the nursing staff, and the patient. "Time-out" conducted as per Joint Commission's Universal Protocol (UP.01.01.01). Time: 1043  Description of Procedure:          Position: Sitting Target Area: Knee Joint Approach: Just above the Lateral tibial plateau, lateral to the infrapatellar tendon. Area Prepped: Entire knee area, from the mid-thigh to the mid-shin. Prepping solution: ChloraPrep (2% chlorhexidine gluconate and 70% isopropyl alcohol) Safety Precautions: Aspiration looking for blood return was conducted prior to all injections. At no point did we inject any substances, as a needle was being advanced. No attempts were made at seeking any paresthesias. Safe injection practices and needle disposal techniques used. Medications properly checked for expiration dates. SDV (single dose vial) medications used. Description of the Procedure: Protocol guidelines were followed. The patient was placed in position over the fluoroscopy table. The target area was identified and the area prepped in the usual manner. Skin & deeper tissues infiltrated with local anesthetic. Appropriate amount of time allowed to pass for local anesthetics to take effect. The procedure needles were then advanced to the target area. Proper needle placement secured. Negative aspiration confirmed. Solution injected in intermittent fashion, asking for systemic symptoms every 0.5cc of injectate. The needles were then removed and the area cleansed, making sure to leave some of the prepping solution back to take advantage of its long term bactericidal properties. Vitals:   04/12/18 1040 04/12/18 1045 04/12/18 1053 04/12/18 1100  BP: (!) 128/59 (!) 114/59 (!) 139/57 (!) 142/61  Pulse: 85 85 82 80  Resp: (!) 22 19 (!) 22 (!) 21  Temp:      TempSrc:       SpO2: 99% 100% 98% 97%  Weight:      Height:        Start Time:  1043 hrs. End Time: 1053 hrs. Materials:  Needle(s) Type: Regular needle Gauge: 22G Length: 3.5-in Medication(s): Please see orders for medications and dosing details.  Imaging Guidance:          Type of Imaging Technique: Fluoroscopy Guidance (Non-spinal) Indication(s): Morbid obesity. Assistance in needle guidance and placement for procedures requiring needle placement in or near specific anatomical locations impossible to access without such assistance. Exposure Time: Please see nurses notes. Contrast: None used. Fluoroscopic Guidance: I was personally present during the use of fluoroscopy. "Tunnel Vision Technique" used to obtain the best possible view of the target area. Parallax error corrected before commencing the procedure. "Direction-depth-direction" technique used to introduce the needle under continuous pulsed fluoroscopy. Once target was reached, antero-posterior, oblique, and lateral fluoroscopic projection used confirm needle placement in all planes. Images permanently stored in EMR. Ultrasound Guidance: N/A Interpretation: No contrast injected. I personally interpreted the imaging intraoperatively. Adequate needle placement confirmed in multiple planes. Permanent images saved into the patient's record.  Antibiotic Prophylaxis:   Anti-infectives (From admission, onward)   None     Indication(s): None identified  Post-operative Assessment:  Post-procedure Vital Signs:  Pulse/HCG Rate: 80  Temp: 98.6 F (37 C) Resp: (!) 21 BP: (!) 142/61 SpO2: 97 %  EBL: None  Complications: No immediate post-treatment complications observed by team, or reported by patient.  Note: The patient tolerated the entire procedure well. A repeat set of vitals were taken after the procedure and the patient was kept under observation following institutional policy, for this type of procedure. Post-procedural neurological  assessment was performed, showing return to baseline, prior to discharge. The patient was provided with post-procedure discharge instructions, including a section on how to identify potential problems. Should any problems arise concerning this procedure, the patient was given instructions to immediately contact us, at any time, without hesitation. In any case, we plan to contact the patient by telephone for a follow-up status report regarding this interventional procedure.  Comments:  No additional relevant information.  Plan of Care    Imaging Orders     DG C-Arm 1-60 Min-No Report  Procedure Orders     KNEE INJECTION  Medications ordered for procedure: Meds ordered this encounter  Medications  . lidocaine (PF) (XYLOCAINE) 1 % injection 10 mL  . ropivacaine (PF) 2 mg/mL (0.2%) (NAROPIN) injection 4 mL  . methylPREDNISolone acetate (DEPO-MEDROL) injection 80 mg   Medications administered: We administered lidocaine (PF), ropivacaine (PF) 2 mg/mL (0.2%), and methylPREDNISolone acetate.  See the medical record for exact dosing, route, and time of administration.  New Prescriptions   No medications on file   Disposition: Discharge home  Discharge Date & Time: 04/12/2018; 1106 hrs.   Physician-requested Follow-up: Return for post-procedure eval (2 wks).  Future Appointments  Date Time Provider [place]  05/09/2018  8:15 AM Milinda Pointer, MD [facility] None   Primary Care Physician: Patient, No Pcp Per Location: [facility] Note by: Gaspar Cola, MD Date: 04/12/2018; Time: 11:15 AM  Disclaimer:  Medicine is not an exact science. The only guarantee in medicine is that nothing is guaranteed. It is important to note that the decision to proceed with this intervention was based on the information collected from the patient. The Data and conclusions were drawn from the patient's questionnaire, the interview, and the physical examination.  Because the information was provided in large part by the patient, it cannot be guaranteed that it has not been purposely or unconsciously manipulated. Every  effort has been made to obtain as much relevant data as possible for this evaluation. It is important to note that the conclusions that lead to this procedure are derived in large part from the available data. Always take into account that the treatment will also be dependent on availability of resources and existing treatment guidelines, considered by other Pain Management Practitioners as being common knowledge and practice, at the time of the intervention. For Medico-Legal purposes, it is also important to point out that variation in procedural techniques and pharmacological choices are the acceptable norm. The indications, contraindications, technique, and results of the above procedure should only be interpreted and judged by a Board-Certified Interventional Pain Specialist with extensive familiarity and expertise in the same exact procedure and technique.

## 2018-04-13 ENCOUNTER — Telehealth: Payer: Self-pay

## 2018-04-13 NOTE — Telephone Encounter (Signed)
Post procedure phone call.  Patient states her left knee is hurting today.  Instructed to put heat on it and to write specifics on her pain diary and to call us if she had any further questions or concerns.

## 2018-05-04 ENCOUNTER — Telehealth: Payer: Self-pay | Admitting: *Deleted

## 2018-05-07 NOTE — Telephone Encounter (Signed)
Patient has an appointment on 05-09-18.  Called and notified that this can be discussed at next appointment.
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
## 2018-05-09 ENCOUNTER — Encounter: Payer: Self-pay | Admitting: Pain Medicine

## 2018-05-09 ENCOUNTER — Ambulatory Visit: Payer: Medicaid Other | Attending: Pain Medicine | Admitting: Pain Medicine

## 2018-05-09 ENCOUNTER — Other Ambulatory Visit: Payer: Self-pay

## 2018-05-09 VITALS — BP 144/86 | HR 97 | Temp 98.4°F | Ht 64.0 in | Wt 327.0 lb

## 2018-05-09 DIAGNOSIS — Z79899 Other long term (current) drug therapy: Secondary | ICD-10-CM | POA: Insufficient documentation

## 2018-05-09 DIAGNOSIS — Z86718 Personal history of other venous thrombosis and embolism: Secondary | ICD-10-CM | POA: Insufficient documentation

## 2018-05-09 DIAGNOSIS — M79604 Pain in right leg: Secondary | ICD-10-CM | POA: Diagnosis not present

## 2018-05-09 DIAGNOSIS — Z6841 Body Mass Index (BMI) 40.0 and over, adult: Secondary | ICD-10-CM | POA: Insufficient documentation

## 2018-05-09 DIAGNOSIS — Z7982 Long term (current) use of aspirin: Secondary | ICD-10-CM | POA: Diagnosis not present

## 2018-05-09 DIAGNOSIS — F4310 Post-traumatic stress disorder, unspecified: Secondary | ICD-10-CM | POA: Insufficient documentation

## 2018-05-09 DIAGNOSIS — M5442 Lumbago with sciatica, left side: Secondary | ICD-10-CM

## 2018-05-09 DIAGNOSIS — M545 Low back pain: Secondary | ICD-10-CM | POA: Insufficient documentation

## 2018-05-09 DIAGNOSIS — J45909 Unspecified asthma, uncomplicated: Secondary | ICD-10-CM | POA: Diagnosis not present

## 2018-05-09 DIAGNOSIS — K219 Gastro-esophageal reflux disease without esophagitis: Secondary | ICD-10-CM | POA: Diagnosis not present

## 2018-05-09 DIAGNOSIS — M170 Bilateral primary osteoarthritis of knee: Secondary | ICD-10-CM | POA: Insufficient documentation

## 2018-05-09 DIAGNOSIS — M533 Sacrococcygeal disorders, not elsewhere classified: Secondary | ICD-10-CM | POA: Insufficient documentation

## 2018-05-09 DIAGNOSIS — M25511 Pain in right shoulder: Secondary | ICD-10-CM

## 2018-05-09 DIAGNOSIS — M797 Fibromyalgia: Secondary | ICD-10-CM | POA: Diagnosis not present

## 2018-05-09 DIAGNOSIS — M25512 Pain in left shoulder: Secondary | ICD-10-CM

## 2018-05-09 DIAGNOSIS — M542 Cervicalgia: Secondary | ICD-10-CM | POA: Diagnosis not present

## 2018-05-09 DIAGNOSIS — Z87891 Personal history of nicotine dependence: Secondary | ICD-10-CM | POA: Diagnosis not present

## 2018-05-09 DIAGNOSIS — M79605 Pain in left leg: Secondary | ICD-10-CM | POA: Diagnosis not present

## 2018-05-09 DIAGNOSIS — M25561 Pain in right knee: Secondary | ICD-10-CM

## 2018-05-09 DIAGNOSIS — F329 Major depressive disorder, single episode, unspecified: Secondary | ICD-10-CM | POA: Insufficient documentation

## 2018-05-09 DIAGNOSIS — I10 Essential (primary) hypertension: Secondary | ICD-10-CM | POA: Insufficient documentation

## 2018-05-09 DIAGNOSIS — G894 Chronic pain syndrome: Secondary | ICD-10-CM | POA: Diagnosis present

## 2018-05-09 DIAGNOSIS — Z79891 Long term (current) use of opiate analgesic: Secondary | ICD-10-CM | POA: Insufficient documentation

## 2018-05-09 DIAGNOSIS — G8929 Other chronic pain: Secondary | ICD-10-CM

## 2018-05-09 DIAGNOSIS — M5441 Lumbago with sciatica, right side: Secondary | ICD-10-CM

## 2018-05-09 DIAGNOSIS — M25562 Pain in left knee: Secondary | ICD-10-CM

## 2018-05-09 NOTE — Addendum Note (Signed)
Addended by: Milinda Pointer A on: 05/09/2018 03:12 PM   Modules accepted: Orders

## 2018-05-09 NOTE — Patient Instructions (Addendum)
____________________________________________________________________________________________  Pain Scale  Introduction: The pain score used by this practice is the Verbal Numerical Rating Scale (VNRS-11). This is an 11-point scale. It is for adults and children 10 years or older. There are significant differences in how the pain score is reported, used, and applied. Forget everything you learned in the past and learn this scoring system.  General Information: The scale should reflect your current level of pain. Unless you are specifically asked for the level of your worst pain, or your average pain. If you are asked for one of these two, then it should be understood that it is over the past 24 hours.  Basic Activities of Daily Living (ADL): Personal hygiene, dressing, eating, transferring, and using restroom.  Instructions: Most patients tend to report their level of pain as a combination of two factors, their physical pain and their psychosocial pain. This last one is also known as "suffering" and it is reflection of how physical pain affects you socially and psychologically. From now on, report them separately. From this point on, when asked to report your pain level, report only your physical pain. Use the following table for reference.  Pain Clinic Pain Levels (0-5/10)  Pain Level Score  Description  No Pain 0   Mild pain 1 Nagging, annoying, but does not interfere with basic activities of daily living (ADL). Patients are able to eat, bathe, get dressed, toileting (being able to get on and off the toilet and perform personal hygiene functions), transfer (move in and out of bed or a chair without assistance), and maintain continence (able to control bladder and bowel functions). Blood pressure and heart rate are unaffected. A normal heart rate for a healthy adult ranges from 60 to 100 bpm (beats per minute).   Mild to moderate pain 2 Noticeable and distracting. Impossible to hide from other  people. More frequent flare-ups. Still possible to adapt and function close to normal. It can be very annoying and may have occasional stronger flare-ups. With discipline, patients may get used to it and adapt.   Moderate pain 3 Interferes significantly with activities of daily living (ADL). It becomes difficult to feed, bathe, get dressed, get on and off the toilet or to perform personal hygiene functions. Difficult to get in and out of bed or a chair without assistance. Very distracting. With effort, it can be ignored when deeply involved in activities.   Moderately severe pain 4 Impossible to ignore for more than a few minutes. With effort, patients may still be able to manage work or participate in some social activities. Very difficult to concentrate. Signs of autonomic nervous system discharge are evident: dilated pupils (mydriasis); mild sweating (diaphoresis); sleep interference. Heart rate becomes elevated (>115 bpm). Diastolic blood pressure (lower number) rises above 100 mmHg. Patients find relief in laying down and not moving.   Severe pain 5 Intense and extremely unpleasant. Associated with frowning face and frequent crying. Pain overwhelms the senses.  Ability to do any activity or maintain social relationships becomes significantly limited. Conversation becomes difficult. Pacing back and forth is common, as getting into a comfortable position is nearly impossible. Pain wakes you up from deep sleep. Physical signs will be obvious: pupillary dilation; increased sweating; goosebumps; brisk reflexes; cold, clammy hands and feet; nausea, vomiting or dry heaves; loss of appetite; significant sleep disturbance with inability to fall asleep or to remain asleep. When persistent, significant weight loss is observed due to the complete loss of appetite and sleep deprivation.  Blood   pressure and heart rate becomes significantly elevated. Caution: If elevated blood pressure triggers a pounding headache  associated with blurred vision, then the patient should immediately seek attention at an urgent or emergency care unit, as these may be signs of an impending stroke.    Emergency Department Pain Levels (6-10/10)  Emergency Room Pain 6 Severely limiting. Requires emergency care and should not be seen or managed at an outpatient pain management facility. Communication becomes difficult and requires great effort. Assistance to reach the emergency department may be required. Facial flushing and profuse sweating along with potentially dangerous increases in heart rate and blood pressure will be evident.   Distressing pain 7 Self-care is very difficult. Assistance is required to transport, or use restroom. Assistance to reach the emergency department will be required. Tasks requiring coordination, such as bathing and getting dressed become very difficult.   Disabling pain 8 Self-care is no longer possible. At this level, pain is disabling. The individual is unable to do even the most "basic" activities such as walking, eating, bathing, dressing, transferring to a bed, or toileting. Fine motor skills are lost. It is difficult to think clearly.   Incapacitating pain 9 Pain becomes incapacitating. Thought processing is no longer possible. Difficult to remember your own name. Control of movement and coordination are lost.   The worst pain imaginable 10 At this level, most patients pass out from pain. When this level is reached, collapse of the autonomic nervous system occurs, leading to a sudden drop in blood pressure and heart rate. This in turn results in a temporary and dramatic drop in blood flow to the brain, leading to a loss of consciousness. Fainting is one of the body's self defense mechanisms. Passing out puts the brain in a calmed state and causes it to shut down for a while, in order to begin the healing process.    Summary: 1. Refer to this scale when providing us with your pain level. 2. Be  accurate and careful when reporting your pain level. This will help with your care. 3. Over-reporting your pain level will lead to loss of credibility. 4. Even a level of 1/10 means that there is pain and will be treated at our facility. 5. High, inaccurate reporting will be documented as "Symptom Exaggeration", leading to loss of credibility and suspicions of possible secondary gains such as obtaining more narcotics, or wanting to appear disabled, for fraudulent reasons. 6. Only pain levels of 5 or below will be seen at our facility. 7. Pain levels of 6 and above will be sent to the Emergency Department and the appointment cancelled. ____________________________________________________________________________________________   ____________________________________________________________________________________________  Preparing for Procedure with Sedation  Instructions: . Oral Intake: Do not eat or drink anything for at least 8 hours prior to your procedure. . Transportation: Public transportation is not allowed. Bring an adult driver. The driver must be physically present in our waiting room before any procedure can be started. . Physical Assistance: Bring an adult physically capable of assisting you, in the event you need help. This adult should keep you company at home for at least 6 hours after the procedure. . Blood Pressure Medicine: Take your blood pressure medicine with a sip of water the morning of the procedure. . Blood thinners: Notify our staff if you are taking any blood thinners. Depending on which one you take, there will be specific instructions on how and when to stop it. . Diabetics on insulin: Notify the staff so that you can be   scheduled 1st case in the morning. If your diabetes requires high dose insulin, take only  of your normal insulin dose the morning of the procedure and notify the staff that you have done so. . Preventing infections: Shower with an antibacterial soap  the morning of your procedure. . Build-up your immune system: Take 1000 mg of Vitamin C with every meal (3 times a day) the day prior to your procedure. . Antibiotics: Inform the staff if you have a condition or reason that requires you to take antibiotics before dental procedures. . Pregnancy: If you are pregnant, call and cancel the procedure. . Sickness: If you have a cold, fever, or any active infections, call and cancel the procedure. . Arrival: You must be in the facility at least 30 minutes prior to your scheduled procedure. . Children: Do not bring children with you. . Dress appropriately: Bring dark clothing that you would not mind if they get stained. . Valuables: Do not bring any jewelry or valuables.  Procedure appointments are reserved for interventional treatments only. . No Prescription Refills. . No medication changes will be discussed during procedure appointments. . No disability issues will be discussed.  Reasons to call and reschedule or cancel your procedure: (Following these recommendations will minimize the risk of a serious complication.) . Surgeries: Avoid having procedures within 2 weeks of any surgery. (Avoid for 2 weeks before or after any surgery). . Flu Shots: Avoid having procedures within 2 weeks of a flu shots or . (Avoid for 2 weeks before or after immunizations). . Barium: Avoid having a procedure within 7-10 days after having had a radiological study involving the use of radiological contrast. (Myelograms, Barium swallow or enema study). . Heart attacks: Avoid any elective procedures or surgeries for the initial 6 months after a "Myocardial Infarction" (Heart Attack). . Blood thinners: It is imperative that you stop these medications before procedures. Let us know if you if you take any blood thinner.  . Infection: Avoid procedures during or within two weeks of an infection (including chest colds or gastrointestinal problems). Symptoms associated with  infections include: Localized redness, fever, chills, night sweats or profuse sweating, burning sensation when voiding, cough, congestion, stuffiness, runny nose, sore throat, diarrhea, nausea, vomiting, cold or Flu symptoms, recent or current infections. It is specially important if the infection is over the area that we intend to treat. . Heart and lung problems: Symptoms that may suggest an active cardiopulmonary problem include: cough, chest pain, breathing difficulties or shortness of breath, dizziness, ankle swelling, uncontrolled high or unusually low blood pressure, and/or palpitations. If you are experiencing any of these symptoms, cancel your procedure and contact your primary care physician for an evaluation.  Remember:  Regular Business hours are:  Monday to Thursday 8:00 AM to 4:00 PM  Provider's Schedule: Millissa Deese, MD:  Procedure days: Tuesday and Thursday 7:30 AM to 4:00 PM  Bilal Lateef, MD:  Procedure days: Monday and Wednesday 7:30 AM to 4:00 PM ____________________________________________________________________________________________    

## 2018-05-10 ENCOUNTER — Other Ambulatory Visit (HOSPITAL_COMMUNITY)
Admission: RE | Admit: 2018-05-10 | Discharge: 2018-05-10 | Disposition: A | Discharge status: Home / Self Care | Payer: Medicaid Other | Source: Ambulatory Visit | Attending: Certified Nurse Midwife | Admitting: Certified Nurse Midwife

## 2018-05-10 ENCOUNTER — Telehealth: Payer: Self-pay | Admitting: *Deleted

## 2018-05-10 ENCOUNTER — Ambulatory Visit
Admission: RE | Admit: 2018-05-10 | Discharge: 2018-05-10 | Disposition: A | Discharge status: Home / Self Care | Payer: Medicaid Other | Source: Ambulatory Visit | Attending: Pain Medicine | Admitting: Pain Medicine

## 2018-05-10 ENCOUNTER — Ambulatory Visit (INDEPENDENT_AMBULATORY_CARE_PROVIDER_SITE_OTHER): Payer: Medicaid Other | Admitting: Certified Nurse Midwife

## 2018-05-10 ENCOUNTER — Ambulatory Visit: Payer: Medicaid Other

## 2018-05-10 ENCOUNTER — Other Ambulatory Visit: Payer: Self-pay | Admitting: Certified Nurse Midwife

## 2018-05-10 VITALS — BP 126/82 | HR 88 | Ht 64.0 in | Wt 343.1 lb

## 2018-05-10 DIAGNOSIS — Z124 Encounter for screening for malignant neoplasm of cervix: Secondary | ICD-10-CM | POA: Insufficient documentation

## 2018-05-10 DIAGNOSIS — Z1231 Encounter for screening mammogram for malignant neoplasm of breast: Secondary | ICD-10-CM

## 2018-05-10 DIAGNOSIS — M79605 Pain in left leg: Secondary | ICD-10-CM | POA: Insufficient documentation

## 2018-05-10 DIAGNOSIS — R109 Unspecified abdominal pain: Secondary | ICD-10-CM

## 2018-05-10 DIAGNOSIS — Z113 Encounter for screening for infections with a predominantly sexual mode of transmission: Secondary | ICD-10-CM

## 2018-05-10 DIAGNOSIS — G8929 Other chronic pain: Secondary | ICD-10-CM | POA: Diagnosis not present

## 2018-05-10 DIAGNOSIS — Z6841 Body Mass Index (BMI) 40.0 and over, adult: Secondary | ICD-10-CM

## 2018-05-10 DIAGNOSIS — Z01419 Encounter for gynecological examination (general) (routine) without abnormal findings: Secondary | ICD-10-CM | POA: Diagnosis present

## 2018-05-10 DIAGNOSIS — N898 Other specified noninflammatory disorders of vagina: Secondary | ICD-10-CM

## 2018-05-10 DIAGNOSIS — Z0000 Encounter for general adult medical examination without abnormal findings: Secondary | ICD-10-CM

## 2018-05-10 DIAGNOSIS — M79604 Pain in right leg: Secondary | ICD-10-CM | POA: Insufficient documentation

## 2018-05-10 DIAGNOSIS — N644 Mastodynia: Secondary | ICD-10-CM

## 2018-05-10 DIAGNOSIS — R102 Pelvic and perineal pain: Secondary | ICD-10-CM

## 2018-05-10 NOTE — Progress Notes (Signed)
ANNUAL PREVENTATIVE CARE GYN  ENCOUNTER NOTE  Subjective:       Lauren [name] is a 42 y.o. [id number] female here for a routine annual gynecologic exam.  Current complaints: 1. Requests Pap smear and STI testing 2. Breast pain 3. Abdominal pain 4. Needs mammogram 5. Asks for assistance finding new PCP  Denies difficulty breathing or respiratory distress, chest pain, excessive vaginal bleeding, and dysuria.   Sister recently diagnosed with cancer after finding abdominal mass, patient uncertain type of cancer. Patient is followed by pain management and will be having surgery on wrist and knee.    Gynecologic History  No LMP recorded. Patient has had an injection.  Contraception: Depo-Provera injections  Last Pap: 2018. Results were: normal  Last mammogram: due.   Obstetric History  OB History  Gravida Para Term Preterm AB Living  12 7 7  0 3 7  SAB TAB Ectopic Multiple Live Births  2   1   2     # Outcome Date GA Lbr Len/2nd Weight Sex Delivery Anes PTL Lv  12 Gravida           11 Term 06/09/01 [redacted]w[redacted]d  7 lb (3.175 kg) F Vag-Spont     10 Term 04/29/98 [redacted]w[redacted]d  6 lb 9 oz (2.977 kg) F Vag-Spont     9 Term 09/24/96 [redacted]w[redacted]d  7 lb 3 oz (3.26 kg) F Vag-Spont     8 Term 03/22/95 [redacted]w[redacted]d  7 lb 1 oz (3.204 kg) M Vag-Spont   LIV  7 Term 10/06/93   7 lb 2 oz (3.232 kg) M Vag-Forceps   LIV  6 SAB 1994          5 Term           4 Term           3 SAB           2 Ectopic           1 Gravida             Past Medical History:  Diagnosis Date  . Advanced maternal age in multigravida   . Asthma   . Depression   . GERD (gastroesophageal reflux disease)   . [place] multiparity   . Headache   . Hypertension   . Leg fracture   . Obesity, morbid, BMI 40.0-49.9 ([place])   . Post traumatic stress disorder   . Pre-eclampsia   . SVT (supraventricular tachycardia) ([place])    a. prior report of SVT req adenosine;  b. 2011 Holter @ [facility] reportedly showed "irregular heartbeat"; c. 05/2016 Holter:  "basically unremarkable" per Dr. Clayborn Bigness;  d. 07/2016 Echo: EF 50-55%. Nl RV fxn.  . Wrist fracture, closed    bilateral    Past Surgical History:  Procedure Laterality Date  . CARDIAC ELECTROPHYSIOLOGY STUDY AND ABLATION  01/18/2018  . CESAREAN SECTION  2010  . CESAREAN SECTION  2011  . CHOLECYSTECTOMY    . KNEE SURGERY    . ROTATOR CUFF REPAIR    . TONSILLECTOMY      Current Outpatient Medications on File Prior to Visit  Medication Sig Dispense Refill  . acetaminophen (TYLENOL) 325 MG tablet Take 650 mg by mouth every 6 (six) hours as needed.    Marland Kitchen aspirin EC 81 MG tablet Take 81 mg by mouth daily.    Marland Kitchen diltiazem (CARDIZEM CD) 180 MG 24 hr capsule Take 1 capsule by mouth daily.    Marland Kitchen esomeprazole (NEXIUM) 40 MG  capsule Take 40 mg by mouth daily at 12 noon.    . furosemide (LASIX) 40 MG tablet Take 40 mg by mouth daily.  5  . ibuprofen (ADVIL,MOTRIN) 800 MG tablet Take 800 mg by mouth every 8 (eight) hours as needed.    . medroxyPROGESTERone Acetate 150 MG/ML SUSY Inject 150 mg into the skin every 3 (three) months.  3  . STIOLTO RESPIMAT 2.5-2.5 MCG/ACT AERS Inhale 2 puffs into the lungs as needed.  5  . Cholecalciferol (VITAMIN D-3) 1000 units CAPS Take by mouth.    . diazepam (VALIUM) 5 MG tablet Take 1 tablet (5 mg total) by mouth as needed for up to 2 doses for anxiety (Take one tab 45 minutes before MRI. Take second tablet just prior to MRI scan). Do not take medication within 4 hours of taking opioid pain medications. Must have a driver. Do not drive or operate machinery x 24 hours after taking this medication. (Patient not taking: Reported on 05/09/2018) 2 tablet 0  . gabapentin (NEURONTIN) 100 MG capsule Take 1-3 capsules (100-300 mg total) by mouth at bedtime. Follow written titration schedule. 90 capsule 0  . meloxicam (MOBIC) 15 MG tablet Take 1 tablet (15 mg total) by mouth daily. 30 tablet 0  . tiZANidine (ZANAFLEX) 4 MG capsule Take 4 mg by mouth 3 (three) times daily.      No current facility-administered medications on file prior to visit.     Allergies  Allergen Reactions  . Nucynta [Tapentadol] Hives    Social History   Socioeconomic History  . Marital status: Single    Spouse name: Not on file  . Number of children: Not on file  . Years of education: Not on file  . Highest education level: Not on file  Occupational History  . Not on file  Social Needs  . Financial resource strain: Not on file  . Food insecurity:    Worry: Not on file    Inability: Not on file  . Transportation needs:    Medical: Not on file    Non-medical: Not on file  Tobacco Use  . Smoking status: Former Smoker    Types: Cigarettes  . Smokeless tobacco: Never Used  . Tobacco comment: occasional smoker  Substance and Sexual Activity  . Alcohol use: No    Comment: occ.  . Drug use: No    Types: Marijuana    Comment: last used several months ago  . Sexual activity: Yes  Lifestyle  . Physical activity:    Days per week: Not on file    Minutes per session: Not on file  . Stress: Not on file  Relationships  . Social connections:    Talks on phone: Not on file    Gets together: Not on file    Attends religious service: Not on file    Active member of club or organization: Not on file    Attends meetings of clubs or organizations: Not on file    Relationship status: Not on file  . Intimate partner violence:    Fear of current or ex partner: Not on file    Emotionally abused: Not on file    Physically abused: Not on file    Forced sexual activity: Not on file  Other Topics Concern  . Not on file  Social History Narrative  . Not on file    Family History  Problem Relation Age of Onset  . Diabetes Sister   . Migraines Maternal Aunt   .  Migraines Maternal Uncle   . Migraines Paternal Aunt   . Migraines Paternal Uncle   . Diabetes Sister   . Diabetes Sister   . Diabetes Sister   . Diabetes Sister   . Diabetes Sister   . Ovarian cancer Cousin   .  Diabetes Mother   . Diabetes Father   . Migraines Father     The following portions of the patient's history were reviewed and updated as appropriate: allergies, current medications, past family history, past medical history, past social history, past surgical history and problem list.  Review of Systems  ROS negative except as noted above. Information obtained from patient.   Objective:   BP 126/82   Pulse 88   Ht 5\' 4"  (1.626 m)   Wt (!) 343 lb 1 oz (155.6 kg)   BMI 58.89 kg/m    CONSTITUTIONAL: Well-developed, well-nourished female in no acute distress.   PSYCHIATRIC: Normal mood and affect. Normal behavior. Normal judgment and thought content.  [place]: Alert and oriented to person, place, and time. Normal muscle tone coordination. No cranial nerve deficit noted.  HENT:  Normocephalic, atraumatic, External right and left ear normal.   EYES: Conjunctivae and EOM are normal. Pupils are equal and round.   NECK: Normal range of motion, supple, no masses. Unable to assess thyroid due to haitus.    SKIN: Skin is warm and dry. No rash noted. Not diaphoretic. No erythema. No pallor.  CARDIOVASCULAR: Normal heart rate noted, regular rhythm, no murmur.  RESPIRATORY: Clear to auscultation bilaterally. Forced expiratory wheeze noted.   BREASTS: Symmetric in size. No masses, skin changes, nipple drainage, or lymphadenopathy.  ABDOMEN: Soft, normal bowel sounds, no distention noted.  No tenderness, rebound or guarding. Obese.   PELVIC:  External Genitalia: Normal  Vagina: Normal  Cervix: Normal  Uterus: Normal  Adnexa: Unable to assess due to habitus  LYMPHATIC: No Axillary, Supraclavicular, or Inguinal Adenopathy.   Assessment:   Annual gynecologic examination 42 y.o.   Contraception: Depo-Provera injections   Obesity 3   Problem List Items Addressed This Visit    None    Visit Diagnoses    Well woman exam    -  Primary   Relevant Orders   CBC   Thyroid  Panel With TSH   Comprehensive metabolic panel   Lipid panel   Hemoglobin A1c   Cytology - PAP   MM 3D SCREEN BREAST BILATERAL   NuSwab Vaginitis Plus (VG+)   Ambulatory referral to Family Practice   Screening for cervical cancer       Relevant Orders   Cytology - PAP   BMI 50.0-59.9, adult (HCC)       Relevant Orders   CBC   Thyroid Panel With TSH   Comprehensive metabolic panel   Lipid panel   Hemoglobin A1c   Ambulatory referral to Family Practice   Breast pain       Abdominal pain, unspecified abdominal location       Breast cancer screening by mammogram       Relevant Orders   MM 3D SCREEN BREAST BILATERAL   Screening examination for STD (sexually transmitted disease)       Relevant Orders   NuSwab Vaginitis Plus (VG+)      Plan:   Pap: Pap Co Test  Mammogram: Ordered  Labs: See orders  Routine preventative health maintenance measures emphasized: Exercise/Diet/Weight control, Tobacco Warnings, Alcohol/Substance use risks and Stress Management; see AVS  Referral to Family Medicine, patient  needs new PCP  RTC for ultrasound due to abdominal pain  RTC x 1 year for Annual Exam or sooner if needed   Diona Fanti, CNM [facility], [facility]

## 2018-05-10 NOTE — Patient Instructions (Signed)
Preventive Care 40-64 Years, Female Preventive care refers to lifestyle choices and visits with your health care provider that can promote health and wellness. What does preventive care include?  A yearly physical exam. This is also called an annual well check.  Dental exams once or twice a year.  Routine eye exams. Ask your health care provider how often you should have your eyes checked.  Personal lifestyle choices, including: ? Daily care of your teeth and gums. ? Regular physical activity. ? Eating a healthy diet. ? Avoiding tobacco and drug use. ? Limiting alcohol use. ? Practicing safe sex. ? Taking low-dose aspirin daily starting at age 58. ? Taking vitamin and mineral supplements as recommended by your health care provider. What happens during an annual well check? The services and screenings done by your health care provider during your annual well check will depend on your age, overall health, lifestyle risk factors, and family history of disease. Counseling Your health care provider may ask you questions about your:  Alcohol use.  Tobacco use.  Drug use.  Emotional well-being.  Home and relationship well-being.  Sexual activity.  Eating habits.  Work and work Statistician.  Method of birth control.  Menstrual cycle.  Pregnancy history.  Screening You may have the following tests or measurements:  Height, weight, and BMI.  Blood pressure.  Lipid and cholesterol levels. These may be checked every 5 years, or more frequently if you are over 81 years old.  Skin check.  Lung cancer screening. You may have this screening every year starting at age 78 if you have a 30-pack-year history of smoking and currently smoke or have quit within the past 15 years.  Fecal occult blood test (FOBT) of the stool. You may have this test every year starting at age 65.  Flexible sigmoidoscopy or colonoscopy. You may have a sigmoidoscopy every 5 years or a colonoscopy  every 10 years starting at age 30.  Hepatitis C blood test.  Hepatitis B blood test.  Sexually transmitted disease (STD) testing.  Diabetes screening. This is done by checking your blood sugar (glucose) after you have not eaten for a while (fasting). You may have this done every 1-3 years.  Mammogram. This may be done every 1-2 years. Talk to your health care provider about when you should start having regular mammograms. This may depend on whether you have a family history of breast cancer.  BRCA-related cancer screening. This may be done if you have a family history of breast, ovarian, tubal, or peritoneal cancers.  Pelvic exam and Pap test. This may be done every 3 years starting at age 80. Starting at age 36, this may be done every 5 years if you have a Pap test in combination with an HPV test.  Bone density scan. This is done to screen for osteoporosis. You may have this scan if you are at high risk for osteoporosis.  Discuss your test results, treatment options, and if necessary, the need for more tests with your health care provider. Vaccines Your health care provider may recommend certain vaccines, such as:  Influenza vaccine. This is recommended every year.  Tetanus, diphtheria, and acellular pertussis (Tdap, Td) vaccine. You may need a Td booster every 10 years.  Varicella vaccine. You may need this if you have not been vaccinated.  Zoster vaccine. You may need this after age 5.  Measles, mumps, and rubella (MMR) vaccine. You may need at least one dose of MMR if you were born in  1955 or later. You may also need a second dose.  Pneumococcal 13-valent conjugate (PCV13) vaccine. You may need this if you have certain conditions and were not previously vaccinated.  Pneumococcal polysaccharide (PPSV23) vaccine. You may need one or two doses if you smoke cigarettes or if you have certain conditions.  Meningococcal vaccine. You may need this if you have certain  conditions.  Hepatitis A vaccine. You may need this if you have certain conditions or if you travel or work in places where you may be exposed to hepatitis A.  Hepatitis B vaccine. You may need this if you have certain conditions or if you travel or work in places where you may be exposed to hepatitis B.  Haemophilus influenzae type b (Hib) vaccine. You may need this if you have certain conditions.  Talk to your health care provider about which screenings and vaccines you need and how often you need them. This information is not intended to replace advice given to you by your health care provider. Make sure you discuss any questions you have with your health care provider. Document Released: 09/25/2015 Document Revised: 05/18/2016 Document Reviewed: 06/30/2015 Elsevier Interactive Patient Education  2018 [employer]  

## 2018-05-10 NOTE — Progress Notes (Signed)
New pt is here for an annual physical. Also c/o stomach and breast pain. LPS 2018 WNL

## 2018-05-11 ENCOUNTER — Ambulatory Visit
Admission: RE | Admit: 2018-05-11 | Discharge: 2018-05-11 | Disposition: A | Discharge status: Home / Self Care | Payer: Medicaid Other | Source: Ambulatory Visit | Attending: Certified Nurse Midwife | Admitting: Certified Nurse Midwife

## 2018-05-11 ENCOUNTER — Ambulatory Visit (INDEPENDENT_AMBULATORY_CARE_PROVIDER_SITE_OTHER): Payer: Medicaid Other

## 2018-05-11 DIAGNOSIS — Z1231 Encounter for screening mammogram for malignant neoplasm of breast: Secondary | ICD-10-CM | POA: Diagnosis present

## 2018-05-11 DIAGNOSIS — Z01419 Encounter for gynecological examination (general) (routine) without abnormal findings: Secondary | ICD-10-CM

## 2018-05-11 DIAGNOSIS — N898 Other specified noninflammatory disorders of vagina: Secondary | ICD-10-CM | POA: Diagnosis not present

## 2018-05-11 DIAGNOSIS — R102 Pelvic and perineal pain: Secondary | ICD-10-CM

## 2018-05-11 LAB — HEMOGLOBIN A1C
Est. average glucose Bld gHb Est-mCnc: 134 mg/dL
Hgb A1c MFr Bld: 6.3 % — ABNORMAL HIGH (ref 4.8–5.6)

## 2018-05-11 LAB — THYROID PANEL WITH TSH
FREE THYROXINE INDEX: 1.9 (ref 1.2–4.9)
T3 UPTAKE RATIO: 23 % — AB (ref 24–39)
T4 TOTAL: 8.4 ug/dL (ref 4.5–12.0)
TSH: 1.02 u[IU]/mL (ref 0.450–4.500)

## 2018-05-11 LAB — COMPREHENSIVE METABOLIC PANEL
ALK PHOS: 66 IU/L (ref 39–117)
ALT: 31 IU/L (ref 0–32)
AST: 23 IU/L (ref 0–40)
Albumin/Globulin Ratio: 1.1 — ABNORMAL LOW (ref 1.2–2.2)
Albumin: 3.9 g/dL (ref 3.5–5.5)
BUN/Creatinine Ratio: 20 (ref 9–23)
BUN: 12 mg/dL (ref 6–24)
Bilirubin Total: <0.2 mg/dL (ref 0.0–1.2)
CALCIUM: 9.4 mg/dL (ref 8.7–10.2)
CO2: 24 mmol/L (ref 20–29)
CREATININE: 0.61 mg/dL (ref 0.57–1.00)
Chloride: 104 mmol/L (ref 96–106)
GFR calc Af Amer: 131 mL/min/{1.73_m2} (ref >59)
GFR, EST NON AFRICAN AMERICAN: 114 mL/min/{1.73_m2} (ref >59)
GLUCOSE: 83 mg/dL (ref 65–99)
Globulin, Total: 3.5 g/dL (ref 1.5–4.5)
Potassium: 4.5 mmol/L (ref 3.5–5.2)
Sodium: 141 mmol/L (ref 134–144)
Total Protein: 7.4 g/dL (ref 6.0–8.5)

## 2018-05-11 LAB — LIPID PANEL
CHOL/HDL RATIO: 3.7 ratio (ref 0.0–4.4)
Cholesterol, Total: 145 mg/dL (ref 100–199)
HDL: 39 mg/dL — AB (ref >39)
LDL CALC: 81 mg/dL (ref 0–99)
TRIGLYCERIDES: 126 mg/dL (ref 0–149)
VLDL Cholesterol Cal: 25 mg/dL (ref 5–40)

## 2018-05-11 LAB — CBC
HEMATOCRIT: 33.8 % — AB (ref 34.0–46.6)
HEMOGLOBIN: 10 g/dL — AB (ref 11.1–15.9)
MCH: 22.7 pg — ABNORMAL LOW (ref 26.6–33.0)
MCHC: 29.6 g/dL — ABNORMAL LOW (ref 31.5–35.7)
MCV: 77 fL — AB (ref 79–97)
Platelets: 517 10*3/uL — ABNORMAL HIGH (ref 150–450)
RBC: 4.41 x10E6/uL (ref 3.77–5.28)
RDW: 15.6 % — ABNORMAL HIGH (ref 12.3–15.4)
WBC: 11.7 10*3/uL — AB (ref 3.4–10.8)

## 2018-05-13 DIAGNOSIS — Z9889 Other specified postprocedural states: Secondary | ICD-10-CM

## 2018-05-13 HISTORY — DX: Other specified postprocedural states: Z98.890

## 2018-05-13 LAB — NUSWAB VAGINITIS PLUS (VG+)
CANDIDA ALBICANS, NAA: NEGATIVE
Candida glabrata, NAA: NEGATIVE
Chlamydia trachomatis, NAA: NEGATIVE
Neisseria gonorrhoeae, NAA: NEGATIVE
Trich vag by NAA: NEGATIVE

## 2018-05-15 ENCOUNTER — Ambulatory Visit: Admission: RE | Admit: 2018-05-15 | Payer: Medicaid Other | Source: Ambulatory Visit

## 2018-05-15 ENCOUNTER — Encounter: Payer: Self-pay | Admitting: Pain Medicine

## 2018-05-15 ENCOUNTER — Ambulatory Visit: Payer: Medicaid Other | Admitting: Pain Medicine

## 2018-05-15 ENCOUNTER — Other Ambulatory Visit: Payer: Self-pay

## 2018-05-15 ENCOUNTER — Ambulatory Visit
Admission: RE | Admit: 2018-05-15 | Discharge: 2018-05-15 | Disposition: A | Discharge status: Home / Self Care | Payer: Medicaid Other | Source: Ambulatory Visit | Attending: Pain Medicine | Admitting: Pain Medicine

## 2018-05-15 VITALS — BP 137/97 | HR 91 | Temp 98.3°F | Resp 32 | Ht 64.0 in | Wt 344.0 lb

## 2018-05-15 DIAGNOSIS — M25562 Pain in left knee: Secondary | ICD-10-CM

## 2018-05-15 DIAGNOSIS — M51369 Other intervertebral disc degeneration, lumbar region without mention of lumbar back pain or lower extremity pain: Secondary | ICD-10-CM

## 2018-05-15 DIAGNOSIS — M5431 Sciatica, right side: Secondary | ICD-10-CM | POA: Insufficient documentation

## 2018-05-15 DIAGNOSIS — M170 Bilateral primary osteoarthritis of knee: Secondary | ICD-10-CM

## 2018-05-15 DIAGNOSIS — M79604 Pain in right leg: Secondary | ICD-10-CM | POA: Insufficient documentation

## 2018-05-15 DIAGNOSIS — M5442 Lumbago with sciatica, left side: Secondary | ICD-10-CM

## 2018-05-15 DIAGNOSIS — M5432 Sciatica, left side: Secondary | ICD-10-CM | POA: Diagnosis not present

## 2018-05-15 DIAGNOSIS — M5136 Other intervertebral disc degeneration, lumbar region: Secondary | ICD-10-CM

## 2018-05-15 DIAGNOSIS — M79605 Pain in left leg: Secondary | ICD-10-CM | POA: Insufficient documentation

## 2018-05-15 DIAGNOSIS — G8929 Other chronic pain: Secondary | ICD-10-CM

## 2018-05-15 DIAGNOSIS — Z9889 Other specified postprocedural states: Secondary | ICD-10-CM | POA: Insufficient documentation

## 2018-05-15 DIAGNOSIS — M25561 Pain in right knee: Secondary | ICD-10-CM

## 2018-05-15 DIAGNOSIS — Z885 Allergy status to narcotic agent status: Secondary | ICD-10-CM | POA: Diagnosis not present

## 2018-05-15 DIAGNOSIS — Z6841 Body Mass Index (BMI) 40.0 and over, adult: Secondary | ICD-10-CM | POA: Insufficient documentation

## 2018-05-15 DIAGNOSIS — I10 Essential (primary) hypertension: Secondary | ICD-10-CM

## 2018-05-15 DIAGNOSIS — M5441 Lumbago with sciatica, right side: Secondary | ICD-10-CM

## 2018-05-15 DIAGNOSIS — Z9049 Acquired absence of other specified parts of digestive tract: Secondary | ICD-10-CM | POA: Insufficient documentation

## 2018-05-15 MED ORDER — MIDAZOLAM HCL 5 MG/5ML IJ SOLN
1.0000 mg | INTRAMUSCULAR | Status: DC | PRN
  Administered 2018-05-15: 4 mg via INTRAVENOUS
  Filled 2018-05-15: qty 5

## 2018-05-15 MED ORDER — LIDOCAINE HCL 2 % IJ SOLN
20.0000 mL | Freq: Once | INTRAMUSCULAR | Status: AC
  Administered 2018-05-15: 400 mg
  Filled 2018-05-15: qty 40

## 2018-05-15 MED ORDER — FENTANYL CITRATE (PF) 100 MCG/2ML IJ SOLN
25.0000 ug | INTRAMUSCULAR | Status: DC | PRN
  Administered 2018-05-15: 100 ug via INTRAVENOUS
  Filled 2018-05-15: qty 2

## 2018-05-15 MED ORDER — SODIUM HYALURONATE (VISCOSUP) 20 MG/2ML IX SOSY
2.0000 mL | PREFILLED_SYRINGE | Freq: Once | INTRA_ARTICULAR | Status: AC
  Administered 2018-05-15: 2 mL via INTRA_ARTICULAR

## 2018-05-15 MED ORDER — ROPIVACAINE HCL 2 MG/ML IJ SOLN
2.0000 mL | Freq: Once | INTRAMUSCULAR | Status: AC
  Administered 2018-05-15: 2 mL via EPIDURAL

## 2018-05-15 MED ORDER — SODIUM CHLORIDE 0.9% FLUSH
2.0000 mL | Freq: Once | INTRAVENOUS | Status: AC
  Administered 2018-05-15: 2 mL

## 2018-05-15 MED ORDER — LACTATED RINGERS IV SOLN
1000.0000 mL | Freq: Once | INTRAVENOUS | Status: AC
  Administered 2018-05-15: 1000 mL via INTRAVENOUS

## 2018-05-15 MED ORDER — ROPIVACAINE HCL 2 MG/ML IJ SOLN
4.0000 mL | Freq: Once | INTRAMUSCULAR | Status: AC
  Administered 2018-05-15: 4 mL via INTRA_ARTICULAR
  Filled 2018-05-15: qty 10

## 2018-05-15 MED ORDER — IOPAMIDOL (ISOVUE-M 200) INJECTION 41%
10.0000 mL | Freq: Once | INTRAMUSCULAR | Status: AC
  Administered 2018-05-15: 10 mL via EPIDURAL
  Filled 2018-05-15: qty 10

## 2018-05-15 MED ORDER — LIDOCAINE HCL (PF) 1 % IJ SOLN
4.0000 mL | Freq: Once | INTRAMUSCULAR | Status: AC
  Administered 2018-05-15: 4 mL
  Filled 2018-05-15: qty 5

## 2018-05-15 MED ORDER — TRIAMCINOLONE ACETONIDE 40 MG/ML IJ SUSP
40.0000 mg | Freq: Once | INTRAMUSCULAR | Status: AC
  Administered 2018-05-15: 40 mg
  Filled 2018-05-15: qty 1

## 2018-05-15 NOTE — Patient Instructions (Signed)
____________________________________________________________________________________________  Post-Procedure Discharge Instructions  Instructions:  Apply ice: Fill a plastic sandwich bag with crushed ice. Cover it with a small towel and apply to injection site. Apply for 15 minutes then remove x 15 minutes. Repeat sequence on day of procedure, until you go to bed. The purpose is to minimize swelling and discomfort after procedure.  Apply heat: Apply heat to procedure site starting the day following the procedure. The purpose is to treat any soreness and discomfort from the procedure.  Food intake: Start with clear liquids (like water) and advance to regular food, as tolerated.   Physical activities: Keep activities to a minimum for the first 8 hours after the procedure.   Driving: If you have received any sedation, you are not allowed to drive for 24 hours after your procedure.  Blood thinner: Restart your blood thinner 6 hours after your procedure. (Only for those taking blood thinners)  Insulin: As soon as you can eat, you may resume your normal dosing schedule. (Only for those taking insulin)  Infection prevention: Keep procedure site clean and dry.  Post-procedure Pain Diary: Extremely important that this be done correctly and accurately. Recorded information will be used to determine the next step in treatment.  Pain evaluated is that of treated area only. Do not include pain from an untreated area.  Complete every hour, on the hour, for the initial 8 hours. Set an alarm to help you do this part accurately.  Do not go to sleep and have it completed later. It will not be accurate.  Follow-up appointment: Keep your follow-up appointment after the procedure. Usually 2 weeks for most procedures. (6 weeks in the case of radiofrequency.) Bring you pain diary.   Expect:  From numbing medicine (AKA: Local Anesthetics): Numbness or decrease in pain.  Onset: Full effect within 15  minutes of injected.  Duration: It will depend on the type of local anesthetic used. On the average, 1 to 8 hours.   From steroids: Decrease in swelling or inflammation. Once inflammation is improved, relief of the pain will follow.  Onset of benefits: Depends on the amount of swelling present. The more swelling, the longer it will take for the benefits to be seen. In some cases, up to 10 days.  Duration: Steroids will stay in the system x 2 weeks. Duration of benefits will depend on multiple posibilities including persistent irritating factors.  Occasional side-effects: Facial flushing, cramps (if present, drink Gatorade and take over-the-counter Magnesium 450-500 mg once to twice a day).  From procedure: Some discomfort is to be expected once the numbing medicine wears off. This should be minimal if ice and heat are applied as instructed.  Call if:  You experience numbness and weakness that gets worse with time, as opposed to wearing off.  New onset bowel or bladder incontinence. (This applies to Spinal procedures only)  Emergency Numbers:  Durning business hours (Monday - Thursday, 8:00 AM - 4:00 PM) (Friday, 9:00 AM - 12:00 Noon): [phone]  After hours: [phone] ____________________________________________________________________________________________   ____________________________________________________________________________________________  Preparing for your procedure (without sedation)  Instructions: . Oral Intake: Do not eat or drink anything for at least 3 hours prior to your procedure. . Transportation: Unless otherwise stated by your physician, you may drive yourself after the procedure. . Blood Pressure Medicine: Take your blood pressure medicine with a sip of water the morning of the procedure. . Blood thinners: Notify our staff if you are taking any blood thinners. Depending on which   one you take, there will be specific instructions on how and when  to stop it. . Diabetics on insulin: Notify the staff so that you can be scheduled 1st case in the morning. If your diabetes requires high dose insulin, take only  of your normal insulin dose the morning of the procedure and notify the staff that you have done so. . Preventing infections: Shower with an antibacterial soap the morning of your procedure.  . Build-up your immune system: Take 1000 mg of Vitamin C with every meal (3 times a day) the day prior to your procedure. . Antibiotics: Inform the staff if you have a condition or reason that requires you to take antibiotics before dental procedures. . Pregnancy: If you are pregnant, call and cancel the procedure. . Sickness: If you have a cold, fever, or any active infections, call and cancel the procedure. . Arrival: You must be in the facility at least 30 minutes prior to your scheduled procedure. . Children: Do not bring any children with you. . Dress appropriately: Bring dark clothing that you would not mind if they get stained. . Valuables: Do not bring any jewelry or valuables.  Procedure appointments are reserved for interventional treatments only. . No Prescription Refills. . No medication changes will be discussed during procedure appointments. . No disability issues will be discussed.  Reasons to call and reschedule or cancel your procedure: (Following these recommendations will minimize the risk of a serious complication.) . Surgeries: Avoid having procedures within 2 weeks of any surgery. (Avoid for 2 weeks before or after any surgery). . Flu Shots: Avoid having procedures within 2 weeks of a flu shots or . (Avoid for 2 weeks before or after immunizations). . Barium: Avoid having a procedure within 7-10 days after having had a radiological study involving the use of radiological contrast. (Myelograms, Barium swallow or enema study). . Heart attacks: Avoid any elective procedures or surgeries for the initial 6 months after a  "Myocardial Infarction" (Heart Attack). . Blood thinners: It is imperative that you stop these medications before procedures. Let us know if you if you take any blood thinner.  . Infection: Avoid procedures during or within two weeks of an infection (including chest colds or gastrointestinal problems). Symptoms associated with infections include: Localized redness, fever, chills, night sweats or profuse sweating, burning sensation when voiding, cough, congestion, stuffiness, runny nose, sore throat, diarrhea, nausea, vomiting, cold or Flu symptoms, recent or current infections. It is specially important if the infection is over the area that we intend to treat. . Heart and lung problems: Symptoms that may suggest an active cardiopulmonary problem include: cough, chest pain, breathing difficulties or shortness of breath, dizziness, ankle swelling, uncontrolled high or unusually low blood pressure, and/or palpitations. If you are experiencing any of these symptoms, cancel your procedure and contact your primary care physician for an evaluation.  Remember:  Regular Business hours are:  Monday to Thursday 8:00 AM to 4:00 PM  Provider's Schedule: Abigail Marsiglia, MD:  Procedure days: Tuesday and Thursday 7:30 AM to 4:00 PM  Bilal Lateef, MD:  Procedure days: Monday and Wednesday 7:30 AM to 4:00 PM ____________________________________________________________________________________________    

## 2018-05-15 NOTE — Progress Notes (Signed)
Safety precautions to be maintained throughout the outpatient stay will include: orient to surroundings, keep bed in low position, maintain call bell within reach at all times, provide assistance with transfer out of bed and ambulation.  

## 2018-05-15 NOTE — Progress Notes (Signed)
Patient's Name: Lauren [name]  MRN: [id number]  Referring Provider: No ref. provider found  DOB: [birth year]  PCP: Patient, No Pcp Per  DOS: 05/15/2018  Note by: Gaspar Cola, MD  Service setting: Ambulatory outpatient  Specialty: Interventional Pain Management  Patient type: Established  Location: [facility] (AMB) Pain Management Facility  Visit type: Interventional Procedure   Primary Reason for Visit: Interventional Pain Management Treatment. CC: Procedure  Procedure #1:  Anesthesia, Analgesia, Anxiolysis:  Type: Therapeutic Intra-Articular Hyalgan Knee Injection #1  Region: Lateral infrapatellar Knee Region Level: Knee Joint Laterality: Bilateral  Type: Local Anesthesia Indication(s): Analgesia         Local Anesthetic: Lidocaine 1-2% Route: Infiltration (Rossiter/IM) IV Access: Declined Sedation: Declined    Indications: 1. Osteoarthritis of knee (Bilateral)   2. Chronic knee pain (Primary Area of Pain) (Bilateral) (L>R)    Procedure #2:  Anesthesia, Analgesia, Anxiolysis:  Type: Diagnostic Inter-Laminar Epidural Steroid Injection #1  Region: Lumbar Level: L4-5 Level. Laterality: Left-Sided Paramedial  Type: Moderate (Conscious) Sedation combined with Local Anesthesia Indication(s): Analgesia and Anxiety Route: Intravenous (IV) IV Access: Secured Sedation: Meaningful verbal contact was maintained at all times during the procedure  Local Anesthetic: Lidocaine 1-2%   Indications: 1. DDD (degenerative disc disease), lumbar   2. 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 Chronic lower extremity pain (Bilateral)    Pain Score: Pre-procedure: 4 /10 Post-procedure: 0-No pain/10  Pre-op Assessment:  Lauren [name] is a 42 y.o. (year old), female patient, seen today for interventional treatment. She  has a past surgical history that includes Rotator cuff repair; Knee surgery; Cholecystectomy; Cesarean section (2010); Cesarean section (2011); Tonsillectomy;  and Cardiac electrophysiology study and ablation (01/18/2018). Lauren [name] has a current medication list which includes the following prescription(s): acetaminophen, vitamin d-3, diltiazem, esomeprazole, furosemide, ibuprofen, medroxyprogesterone acetate, stiolto respimat, tizanidine, aspirin ec, diazepam, gabapentin, and meloxicam, and the following Facility-Administered Medications: fentanyl and midazolam. Her primarily concern today is the Procedure  Initial Vital Signs:  Pulse/HCG Rate: 83ECG Heart Rate: 89 Temp: 98.4 F (36.9 C) Resp: 18 BP: 135/78 SpO2: 99 %  BMI: Estimated body mass index is 59.05 kg/m as calculated from the following:   Height as of this encounter: 5\' 4"  (1.626 m).   Weight as of this encounter: 344 lb (156 kg).  Risk Assessment: Allergies: Reviewed. She is allergic to nucynta [tapentadol].  Allergy Precautions: None required Coagulopathies: Reviewed. None identified.  Blood-thinner therapy: None at this time Active Infection(s): Reviewed. None identified. Lauren [name] is afebrile  Site Confirmation: Lauren [name] was asked to confirm the procedure and laterality before marking the site Procedure checklist: Completed Consent: Before the procedure and under the influence of no sedative(s), amnesic(s), or anxiolytics, the patient was informed of the treatment options, risks and possible complications. To fulfill our ethical and legal obligations, as recommended by the American Medical Association's Code of Ethics, I have informed the patient of my clinical impression; the nature and purpose of the treatment or procedure; the risks, benefits, and possible complications of the intervention; the alternatives, including doing nothing; the risk(s) and benefit(s) of the alternative treatment(s) or procedure(s); and the risk(s) and benefit(s) of doing nothing. The patient was provided information about the general risks and possible complications associated with the  procedure. These may include, but are not limited to: failure to achieve desired goals, infection, bleeding, organ or nerve damage, allergic reactions, paralysis, and death. In addition, the patient was informed of those risks and  complications associated to the procedure, such as failure to decrease pain; infection; bleeding; organ or nerve damage with subsequent damage to sensory, motor, and/or autonomic systems, resulting in permanent pain, numbness, and/or weakness of one or several areas of the body; allergic reactions; (i.e.: anaphylactic reaction); and/or death. Furthermore, the patient was informed of those risks and complications associated with the medications. These include, but are not limited to: allergic reactions (i.e.: anaphylactic or anaphylactoid reaction(s)); adrenal axis suppression; blood sugar elevation that in diabetics may result in ketoacidosis or comma; water retention that in patients with history of congestive heart failure may result in shortness of breath, pulmonary edema, and decompensation with resultant heart failure; weight gain; swelling or edema; medication-induced neural toxicity; particulate matter embolism and blood vessel occlusion with resultant organ, and/or nervous system infarction; and/or aseptic necrosis of one or more joints. Finally, the patient was informed that Medicine is not an exact science; therefore, there is also the possibility of unforeseen or unpredictable risks and/or possible complications that may result in a catastrophic outcome. The patient indicated having understood very clearly. We have given the patient no guarantees and we have made no promises. Enough time was given to the patient to ask questions, all of which were answered to the patient's satisfaction. Lauren [name] has indicated that she wanted to continue with the procedure. Attestation: I, the ordering provider, attest that I have discussed with the patient the benefits, risks,  side-effects, alternatives, likelihood of achieving goals, and potential problems during recovery for the procedure that I have provided informed consent. Date  Time: 05/15/2018  8:31 AM  Pre-Procedure Preparation:  Monitoring: As per clinic protocol. Respiration, ETCO2, SpO2, BP, heart rate and rhythm monitor placed and checked for adequate function Safety Precautions: Patient was assessed for positional comfort and pressure points before starting the procedure. Time-out: I initiated and conducted the "Time-out" before starting the procedure, as per protocol. The patient was asked to participate by confirming the accuracy of the "Time Out" information. Verification of the correct person, site, and procedure were performed and confirmed by me, the nursing staff, and the patient. "Time-out" conducted as per Joint Commission's Universal Protocol (UP.01.01.01). Time: (LESI [phone])  Description of Procedure #1:  Position: Supine Target Area: Knee Joint Approach: Just above the Lateral tibial plateau, lateral to the infrapatellar tendon. Area Prepped: Entire knee area, from the mid-thigh to the mid-shin. Prepping solution: ChloraPrep (2% chlorhexidine gluconate and 70% isopropyl alcohol) Safety Precautions: Aspiration looking for blood return was conducted prior to all injections. At no point did we inject any substances, as a needle was being advanced. No attempts were made at seeking any paresthesias. Safe injection practices and needle disposal techniques used. Medications properly checked for expiration dates. SDV (single dose vial) medications used. Description of the Procedure: Protocol guidelines were followed. The patient was placed in position over the fluoroscopy table. The target area was identified and the area prepped in the usual manner. Skin & deeper tissues infiltrated with local anesthetic. Appropriate amount of time allowed to pass for local anesthetics to take effect. The procedure needles  were then advanced to the target area. Proper needle placement secured. Negative aspiration confirmed. Solution injected in intermittent fashion, asking for systemic symptoms every 0.5cc of injectate. The needles were then removed and the area cleansed, making sure to leave some of the prepping solution back to take advantage of its long term bactericidal properties.  Vitals:   05/15/18 0943 05/15/18 0953 05/15/18 1003 05/15/18 1013  BP: Marland Kitchen)  146/87 128/64 132/81 (!) 137/97  Pulse:  84 82 91  Resp: (!) 21 (!) 23 (!) 22 (!) 32  Temp:  98.1 F (36.7 C)  98.3 F (36.8 C)  TempSrc:    Temporal  SpO2: 94% 100% 100% 100%  Weight:      Height:          Start Time: (LESI 0937) hrs.  Materials:  Needle(s) Type: Regular needle Gauge: 22G Length: 3.5-in Medication(s): Please see orders for medications and dosing details.  Imaging Guidance for procedure #1:  Type of Imaging Technique: Fluoroscopy Guidance (Non-spinal) Indication(s): Morbid obesity. Assistance in needle guidance and placement for procedures requiring needle placement in or near specific anatomical locations impossible to access without such assistance. Exposure Time: Please see nurses notes. Contrast: None used. Fluoroscopic Guidance: I was personally present during the use of fluoroscopy. "Tunnel Vision Technique" used to obtain the best possible view of the target area. Parallax error corrected before commencing the procedure. "Direction-depth-direction" technique used to introduce the needle under continuous pulsed fluoroscopy. Once target was reached, antero-posterior, oblique, and lateral fluoroscopic projection used confirm needle placement in all planes. Images permanently stored in EMR. Ultrasound Guidance: N/A Interpretation: No contrast injected. I personally interpreted the imaging intraoperatively. Adequate needle placement confirmed in multiple planes. Permanent images saved into the patient's record.   Pre-procedure  #2:   Consent: Before the procedure and under the influence of no sedative(s), amnesic(s), or anxiolytics, the patient was informed of the treatment options, risks and possible complications. To fulfill our ethical and legal obligations, as recommended by the American Medical Association's Code of Ethics, I have informed the patient of my clinical impression; the nature and purpose of the treatment or procedure; the risks, benefits, and possible complications of the intervention; the alternatives, including doing nothing; the risk(s) and benefit(s) of the alternative treatment(s) or procedure(s); and the risk(s) and benefit(s) of doing nothing. The patient was provided information about the general risks and possible complications associated with the procedure. These may include, but are not limited to: failure to achieve desired goals, infection, bleeding, organ or nerve damage, allergic reactions, paralysis, and death. In addition, the patient was informed of those risks and complications associated to Spine-related procedures, such as failure to decrease pain; infection (i.e.: Meningitis, epidural or intraspinal abscess); bleeding (i.e.: epidural hematoma, subarachnoid hemorrhage, or any other type of intraspinal or peri-dural bleeding); organ or nerve damage (i.e.: Any type of peripheral nerve, nerve root, or spinal cord injury) with subsequent damage to sensory, motor, and/or autonomic systems, resulting in permanent pain, numbness, and/or weakness of one or several areas of the body; allergic reactions; (i.e.: anaphylactic reaction); and/or death. Furthermore, the patient was informed of those risks and complications associated with the medications. These include, but are not limited to: allergic reactions (i.e.: anaphylactic or anaphylactoid reaction(s)); adrenal axis suppression; blood sugar elevation that in diabetics may result in ketoacidosis or comma; water retention that in patients with history of  congestive heart failure may result in shortness of breath, pulmonary edema, and decompensation with resultant heart failure; weight gain; swelling or edema; medication-induced neural toxicity; particulate matter embolism and blood vessel occlusion with resultant organ, and/or nervous system infarction; and/or aseptic necrosis of one or more joints. Finally, the patient was informed that Medicine is not an exact science; therefore, there is also the possibility of unforeseen or unpredictable risks and/or possible complications that may result in a catastrophic outcome. The patient indicated having understood very clearly. We have given  the patient no guarantees and we have made no promises. Enough time was given to the patient to ask questions, all of which were answered to the patient's satisfaction. Lauren [name] has indicated that she wanted to continue with the procedure. Attestation: I, the ordering provider, attest that I have discussed with the patient the benefits, risks, side-effects, alternatives, likelihood of achieving goals, and potential problems during recovery for the procedure that I have provided informed consent.  Description of Procedure #2:  Position: Prone with head of the table was raised to facilitate breathing. Target Area: The interlaminar space, initially targeting the lower laminar border of the superior vertebral body. Approach: Paramedial approach. Area Prepped: Entire Posterior Lumbar Region Prepping solution: ChloraPrep (2% chlorhexidine gluconate and 70% isopropyl alcohol) Safety Precautions: Aspiration looking for blood return was conducted prior to all injections. At no point did we inject any substances, as a needle was being advanced. No attempts were made at seeking any paresthesias. Safe injection practices and needle disposal techniques used. Medications properly checked for expiration dates. SDV (single dose vial) medications used. Description of the Procedure:  Protocol guidelines were followed. The procedure needle was introduced through the skin, ipsilateral to the reported pain, and advanced to the target area. Bone was contacted and the needle walked caudad, until the lamina was cleared. The epidural space was identified using "loss-of-resistance technique" with 2-3 ml of PF-NaCl (0.9% NSS), in a 5cc LOR glass syringe.  Vitals:   05/15/18 0943 05/15/18 0953 05/15/18 1003 05/15/18 1013  BP: (!) 146/87 128/64 132/81 (!) 137/97  Pulse:  84 82 91  Resp: (!) 21 (!) 23 (!) 22 (!) 32  Temp:  98.1 F (36.7 C)  98.3 F (36.8 C)  TempSrc:    Temporal  SpO2: 94% 100% 100% 100%  Weight:      Height:         End Time: (0943 LESI) hrs.  Materials:  Needle(s) Type: Epidural needle Gauge: 20G Length: 15cm Medication(s): Please see orders for medications and dosing details.  Imaging Guidance (Spinal) for procedure #2:  Type of Imaging Technique: Fluoroscopy Guidance (Spinal) Indication(s): Assistance in needle guidance and placement for procedures requiring needle placement in or near specific anatomical locations not easily accessible without such assistance. Exposure Time: Please see nurses notes. Contrast: Before injecting any contrast, we confirmed that the patient did not have an allergy to iodine, shellfish, or radiological contrast. Once satisfactory needle placement was completed at the desired level, radiological contrast was injected. Contrast injected under live fluoroscopy. No contrast complications. See chart for type and volume of contrast used. Fluoroscopic Guidance: I was personally present during the use of fluoroscopy. "Tunnel Vision Technique" used to obtain the best possible view of the target area. Parallax error corrected before commencing the procedure. "Direction-depth-direction" technique used to introduce the needle under continuous pulsed fluoroscopy. Once target was reached, antero-posterior, oblique, and lateral fluoroscopic  projection used confirm needle placement in all planes. Images permanently stored in EMR. Interpretation: I personally interpreted the imaging intraoperatively. Adequate needle placement confirmed in multiple planes. Appropriate spread of contrast into desired area was observed. No evidence of afferent or efferent intravascular uptake. No intrathecal or subarachnoid spread observed. Permanent images saved into the patient's record.  Antibiotic Prophylaxis:   Anti-infectives (From admission, onward)   None     Indication(s): None identified  Post-operative Assessment:  Post-procedure Vital Signs:  Pulse/HCG Rate: 9181 Temp: 98.3 F (36.8 C) Resp: (!) 32 BP: (!) 137/97 SpO2: 100 %  EBL: None  Complications: No immediate post-treatment complications observed by team, or reported by patient.  Note: The patient tolerated the entire procedure well. A repeat set of vitals were taken after the procedure and the patient was kept under observation following institutional policy, for this type of procedure. Post-procedural neurological assessment was performed, showing return to baseline, prior to discharge. The patient was provided with post-procedure discharge instructions, including a section on how to identify potential problems. Should any problems arise concerning this procedure, the patient was given instructions to immediately contact us, at any time, without hesitation. In any case, we plan to contact the patient by telephone for a follow-up status report regarding this interventional procedure.  Comments:  No additional relevant information.  Plan of Care    Imaging Orders     DG C-Arm 1-60 Min-No Report  Procedure Orders     KNEE INJECTION     KNEE INJECTION     Lumbar Epidural Injection  Medications ordered for procedure: Meds ordered this encounter  Medications  . lidocaine (PF) (XYLOCAINE) 1 % injection 4 mL  . ropivacaine (PF) 2 mg/mL (0.2%) (NAROPIN) injection 4 mL  .  Sodium Hyaluronate SOSY 2 mL  . Sodium Hyaluronate SOSY 2 mL  . iopamidol (ISOVUE-M) 41 % intrathecal injection 10 mL    Must be Myelogram-compatible. If not available, you may substitute with a water-soluble, non-ionic, hypoallergenic, myelogram-compatible radiological contrast medium.  Marland Kitchen lidocaine (XYLOCAINE) 2 % (with pres) injection 400 mg  . midazolam (VERSED) 5 MG/5ML injection 1-2 mg    Make sure Flumazenil is available in the pyxis when using this medication. If oversedation occurs, administer 0.2 mg IV over 15 sec. If after 45 sec no response, administer 0.2 mg again over 1 min; may repeat at 1 min intervals; not to exceed 4 doses (1 mg)  . fentaNYL (SUBLIMAZE) injection 25-50 mcg    Make sure Narcan is available in the pyxis when using this medication. In the event of respiratory depression (RR< 8/min): Titrate NARCAN (naloxone) in increments of 0.1 to 0.2 mg IV at 2-3 minute intervals, until desired degree of reversal.  . lactated ringers infusion 1,000 mL  . sodium chloride flush (NS) 0.9 % injection 2 mL  . ropivacaine (PF) 2 mg/mL (0.2%) (NAROPIN) injection 2 mL  . triamcinolone acetonide (KENALOG-40) injection 40 mg   Medications administered: We administered lidocaine (PF), ropivacaine (PF) 2 mg/mL (0.2%), Sodium Hyaluronate, Sodium Hyaluronate, iopamidol, lidocaine, midazolam, fentaNYL, lactated ringers, sodium chloride flush, ropivacaine (PF) 2 mg/mL (0.2%), and triamcinolone acetonide.  See the medical record for exact dosing, route, and time of administration.  New Prescriptions   No medications on file   Disposition: Discharge home  Discharge Date & Time: 05/15/2018; 1017 hrs.   Physician-requested Follow-up: Return for PPE (2 wks) + Procedure (no sedation): (B) Hyalgan.  Future Appointments  Date Time Provider [place]  05/29/2018  9:00 AM Milinda Pointer, MD [facility] None  05/13/2019  3:00 PM Lawhorn, Lara Mulch, CNM [facility] None   Primary Care  Physician: Patient, No Pcp Per Location: [facility] Note by: Gaspar Cola, MD Date: 05/15/2018; Time: 10:56 AM  Disclaimer:  Medicine is not an exact science. The only guarantee in medicine is that nothing is guaranteed. It is important to note that the decision to proceed with this intervention was based on the information collected from the patient. The Data and conclusions were drawn from the patient's questionnaire, the interview, and the physical examination.  Because the information was provided in large part by the patient, it cannot be guaranteed that it has not been purposely or unconsciously manipulated. Every effort has been made to obtain as much relevant data as possible for this evaluation. It is important to note that the conclusions that lead to this procedure are derived in large part from the available data. Always take into account that the treatment will also be dependent on availability of resources and existing treatment guidelines, considered by other Pain Management Practitioners as being common knowledge and practice, at the time of the intervention. For Medico-Legal purposes, it is also important to point out that variation in procedural techniques and pharmacological choices are the acceptable norm. The indications, contraindications, technique, and results of the above procedure should only be interpreted and judged by a Board-Certified Interventional Pain Specialist with extensive familiarity and expertise in the same exact procedure and technique.

## 2018-05-16 ENCOUNTER — Telehealth: Payer: Self-pay

## 2018-05-16 LAB — CYTOLOGY - PAP
Adequacy: ABSENT
Diagnosis: NEGATIVE
HPV (WINDOPATH): NOT DETECTED

## 2018-05-16 NOTE — Telephone Encounter (Signed)
Post procedure phone call.  Patient states she is doing well.  

## 2018-05-29 ENCOUNTER — Ambulatory Visit: Payer: Medicaid Other | Admitting: Pain Medicine

## 2018-06-01 ENCOUNTER — Ambulatory Visit: Payer: Medicaid Other | Admitting: Dietician

## 2018-06-04 NOTE — Progress Notes (Signed)
Patient's Name: Lauren [name]  MRN: [id number]  Referring Provider: No ref. provider found  DOB: April 26, 1976  PCP: Patient, No Pcp Per  DOS: 06/05/2018  Note by: Gaspar Cola, MD  Service setting: Ambulatory outpatient  Specialty: Interventional Pain Management  Patient type: Established  Location: [facility] (AMB) Pain Management Facility  Visit type: Interventional Procedure   Primary Reason for Visit: Interventional Pain Management Treatment. CC: Procedure (Hyalgan)  Procedure:          Anesthesia, Analgesia, Anxiolysis:  Type: Therapeutic Intra-Articular Hyalgan Knee Injection #2  Region: Lateral infrapatellar Knee Region Level: Knee Joint Laterality: Bilateral  Type: Local Anesthesia Indication(s): Analgesia         Local Anesthetic: Lidocaine 1-2% Route: Infiltration (Mehama/IM) IV Access: Declined Sedation: Declined    Indications: 1. Osteoarthritis of knee (Bilateral)   2. Chronic knee pain (Primary Area of Pain) (Bilateral) (L>R)    Pain Score: Pre-procedure: 3 /10 Post-procedure: 0-No pain/10  Pre-op Assessment:  Lauren [name] is a 42 y.o. (year old), female patient, seen today for interventional treatment. She  has a past surgical history that includes Rotator cuff repair; Knee surgery; Cholecystectomy; Cesarean section (2010); Cesarean section (2011); Tonsillectomy; and Cardiac electrophysiology study and ablation (01/18/2018). Lauren [name] has a current medication list which includes the following prescription(s): acetaminophen, aspirin ec, vitamin d-3, diltiazem, esomeprazole, furosemide, ibuprofen, medroxyprogesterone acetate, stiolto respimat, tizanidine, diazepam, gabapentin, and meloxicam. Her primarily concern today is the Procedure (Hyalgan)  Initial Vital Signs:  Pulse/HCG Rate: 100ECG Heart Rate: 94 Temp: 98.5 F (36.9 C) Resp: 16 BP: (!) 150/80 SpO2: 100 %  BMI: Estimated body mass index is 58.36 kg/m as calculated from the following:   Height as of this  encounter: 5\' 4"  (1.626 m).   Weight as of this encounter: 340 lb (154.2 kg).  Risk Assessment: Allergies: Reviewed. She is allergic to nucynta [tapentadol].  Allergy Precautions: None required Coagulopathies: Reviewed. None identified.  Blood-thinner therapy: None at this time Active Infection(s): Reviewed. None identified. Lauren [name] is afebrile  Site Confirmation: Lauren [name] was asked to confirm the procedure and laterality before marking the site Procedure checklist: Completed Consent: Before the procedure and under the influence of no sedative(s), amnesic(s), or anxiolytics, the patient was informed of the treatment options, risks and possible complications. To fulfill our ethical and legal obligations, as recommended by the American Medical Association's Code of Ethics, I have informed the patient of my clinical impression; the nature and purpose of the treatment or procedure; the risks, benefits, and possible complications of the intervention; the alternatives, including doing nothing; the risk(s) and benefit(s) of the alternative treatment(s) or procedure(s); and the risk(s) and benefit(s) of doing nothing. The patient was provided information about the general risks and possible complications associated with the procedure. These may include, but are not limited to: failure to achieve desired goals, infection, bleeding, organ or nerve damage, allergic reactions, paralysis, and death. In addition, the patient was informed of those risks and complications associated to the procedure, such as failure to decrease pain; infection; bleeding; organ or nerve damage with subsequent damage to sensory, motor, and/or autonomic systems, resulting in permanent pain, numbness, and/or weakness of one or several areas of the body; allergic reactions; (i.e.: anaphylactic reaction); and/or death. Furthermore, the patient was informed of those risks and complications associated with the medications. These  include, but are not limited to: allergic reactions (i.e.: anaphylactic or anaphylactoid reaction(s)); adrenal axis suppression; blood sugar elevation that in diabetics may result in ketoacidosis  or comma; water retention that in patients with history of congestive heart failure may result in shortness of breath, pulmonary edema, and decompensation with resultant heart failure; weight gain; swelling or edema; medication-induced neural toxicity; particulate matter embolism and blood vessel occlusion with resultant organ, and/or nervous system infarction; and/or aseptic necrosis of one or more joints. Finally, the patient was informed that Medicine is not an exact science; therefore, there is also the possibility of unforeseen or unpredictable risks and/or possible complications that may result in a catastrophic outcome. The patient indicated having understood very clearly. We have given the patient no guarantees and we have made no promises. Enough time was given to the patient to ask questions, all of which were answered to the patient's satisfaction. Lauren [name] has indicated that she wanted to continue with the procedure. Attestation: I, the ordering provider, attest that I have discussed with the patient the benefits, risks, side-effects, alternatives, likelihood of achieving goals, and potential problems during recovery for the procedure that I have provided informed consent. Date  Time: 06/05/2018  8:21 AM  Pre-Procedure Preparation:  Monitoring: As per clinic protocol. Respiration, ETCO2, SpO2, BP, heart rate and rhythm monitor placed and checked for adequate function Safety Precautions: Patient was assessed for positional comfort and pressure points before starting the procedure. Time-out: I initiated and conducted the "Time-out" before starting the procedure, as per protocol. The patient was asked to participate by confirming the accuracy of the "Time Out" information. Verification of the correct  person, site, and procedure were performed and confirmed by me, the nursing staff, and the patient. "Time-out" conducted as per Joint Commission's Universal Protocol (UP.01.01.01). Time: 0909  Description of Procedure:          Position: Sitting Target Area: Knee Joint Approach: Just above the Lateral tibial plateau, lateral to the infrapatellar tendon. Area Prepped: Entire knee area, from the mid-thigh to the mid-shin. Prepping solution: ChloraPrep (2% chlorhexidine gluconate and 70% isopropyl alcohol) Safety Precautions: Aspiration looking for blood return was conducted prior to all injections. At no point did we inject any substances, as a needle was being advanced. No attempts were made at seeking any paresthesias. Safe injection practices and needle disposal techniques used. Medications properly checked for expiration dates. SDV (single dose vial) medications used. Description of the Procedure: Protocol guidelines were followed. The patient was placed in position over the fluoroscopy table. The target area was identified and the area prepped in the usual manner. Skin & deeper tissues infiltrated with local anesthetic. Appropriate amount of time allowed to pass for local anesthetics to take effect. The procedure needles were then advanced to the target area. Proper needle placement secured. Negative aspiration confirmed. Solution injected in intermittent fashion, asking for systemic symptoms every 0.5cc of injectate. The needles were then removed and the area cleansed, making sure to leave some of the prepping solution back to take advantage of its long term bactericidal properties. Vitals:   06/05/18 0905 06/05/18 0910 06/05/18 0915 06/05/18 0918  BP: 133/86 (!) 132/94 (!) 144/84 140/80  Pulse:      Resp: (!) 24 (!) 22 20 (!) 22  Temp:      SpO2: 99% 100% 100% 100%  Weight:      Height:        Start Time: 0909 hrs. End Time: 0917 hrs. Materials:  Needle(s) Type: Regular needle Gauge:  25G Length: 1.5-in Medication(s): Please see orders for medications and dosing details. Imaging Guidance:  Type of Imaging Technique: Fluoroscopy Guidance (Non-spinal) Indication(s): Morbid obesity. Assistance in needle guidance and placement for procedures requiring needle placement in or near specific anatomical locations impossible to access without such assistance. Exposure Time: Please see nurses notes. Contrast: None used. Fluoroscopic Guidance: I was personally present during the use of fluoroscopy. "Tunnel Vision Technique" used to obtain the best possible view of the target area. Parallax error corrected before commencing the procedure. "Direction-depth-direction" technique used to introduce the needle under continuous pulsed fluoroscopy. Once target was reached, antero-posterior, oblique, and lateral fluoroscopic projection used confirm needle placement in all planes. Images permanently stored in EMR. Ultrasound Guidance: N/A Interpretation: No contrast injected. I personally interpreted the imaging intraoperatively. Adequate needle placement confirmed in multiple planes. Permanent images saved into the patient's record.  Antibiotic Prophylaxis:   Anti-infectives (From admission, onward)   None     Indication(s): None identified  Post-operative Assessment:  Post-procedure Vital Signs:  Pulse/HCG Rate: 10094 Temp: 98.5 F (36.9 C) Resp: (!) 22 BP: 140/80 SpO2: 100 %  EBL: None  Complications: No immediate post-treatment complications observed by team, or reported by patient.  Note: The patient tolerated the entire procedure well. A repeat set of vitals were taken after the procedure and the patient was kept under observation following institutional policy, for this type of procedure. Post-procedural neurological assessment was performed, showing return to baseline, prior to discharge. The patient was provided with post-procedure discharge instructions, including a  section on how to identify potential problems. Should any problems arise concerning this procedure, the patient was given instructions to immediately contact us, at any time, without hesitation. In any case, we plan to contact the patient by telephone for a follow-up status report regarding this interventional procedure.  Comments:  No additional relevant information.  Plan of Care    Imaging Orders     DG C-Arm 1-60 Min-No Report  Procedure Orders     KNEE INJECTION     KNEE INJECTION  Medications ordered for procedure: Meds ordered this encounter  Medications  . DISCONTD: lidocaine (XYLOCAINE) 2 % (with pres) injection 400 mg  . lidocaine (PF) (XYLOCAINE) 1 % injection 4 mL  . ropivacaine (PF) 2 mg/mL (0.2%) (NAROPIN) injection 4 mL  . Sodium Hyaluronate SOSY 2 mL  . Sodium Hyaluronate SOSY 2 mL   Medications administered: We administered lidocaine (PF), ropivacaine (PF) 2 mg/mL (0.2%), Sodium Hyaluronate, and Sodium Hyaluronate.  See the medical record for exact dosing, route, and time of administration.  New Prescriptions   No medications on file   Disposition: Discharge home  Discharge Date & Time: 06/05/2018; 0919 hrs.   Physician-requested Follow-up: Return for PPE (2 wks) + Procedure (no sedation)(+ Fluoro): (B) Hyalgan #3.  Future Appointments  Date Time Provider [place]  06/19/2018  1:45 PM Milinda Pointer, MD [facility] None  06/26/2018  2:40 PM Hubbard Hartshorn, FNP [place] [facility]  07/03/2018  2:20 PM Glean Hess, MD [facility] None  05/13/2019  3:00 PM Lawhorn, Lara Mulch, CNM [facility] None   Primary Care Physician: Patient, No Pcp Per Location: [facility] Note by: Gaspar Cola, MD Date: 06/05/2018; Time: 9:31 AM  Disclaimer:  Medicine is not an exact science. The only guarantee in medicine is that nothing is guaranteed. It is important to note that the decision to proceed with this intervention was based  on the information collected from the patient. The Data and conclusions were drawn from the patient's questionnaire, the interview, and the  physical examination. Because the information was provided in large part by the patient, it cannot be guaranteed that it has not been purposely or unconsciously manipulated. Every effort has been made to obtain as much relevant data as possible for this evaluation. It is important to note that the conclusions that lead to this procedure are derived in large part from the available data. Always take into account that the treatment will also be dependent on availability of resources and existing treatment guidelines, considered by other Pain Management Practitioners as being common knowledge and practice, at the time of the intervention. For Medico-Legal purposes, it is also important to point out that variation in procedural techniques and pharmacological choices are the acceptable norm. The indications, contraindications, technique, and results of the above procedure should only be interpreted and judged by a Board-Certified Interventional Pain Specialist with extensive familiarity and expertise in the same exact procedure and technique.

## 2018-06-04 NOTE — Patient Instructions (Signed)
____________________________________________________________________________________________  Post-Procedure Discharge Instructions  Instructions:  Apply ice: Fill a plastic sandwich bag with crushed ice. Cover it with a small towel and apply to injection site. Apply for 15 minutes then remove x 15 minutes. Repeat sequence on day of procedure, until you go to bed. The purpose is to minimize swelling and discomfort after procedure.  Apply heat: Apply heat to procedure site starting the day following the procedure. The purpose is to treat any soreness and discomfort from the procedure.  Food intake: Start with clear liquids (like water) and advance to regular food, as tolerated.   Physical activities: Keep activities to a minimum for the first 8 hours after the procedure.   Driving: If you have received any sedation, you are not allowed to drive for 24 hours after your procedure.  Blood thinner: Restart your blood thinner 6 hours after your procedure. (Only for those taking blood thinners)  Insulin: As soon as you can eat, you may resume your normal dosing schedule. (Only for those taking insulin)  Infection prevention: Keep procedure site clean and dry.  Post-procedure Pain Diary: Extremely important that this be done correctly and accurately. Recorded information will be used to determine the next step in treatment.  Pain evaluated is that of treated area only. Do not include pain from an untreated area.  Complete every hour, on the hour, for the initial 8 hours. Set an alarm to help you do this part accurately.  Do not go to sleep and have it completed later. It will not be accurate.  Follow-up appointment: Keep your follow-up appointment after the procedure. Usually 2 weeks for most procedures. (6 weeks in the case of radiofrequency.) Bring you pain diary.   Expect:  From numbing medicine (AKA: Local Anesthetics): Numbness or decrease in pain.  Onset: Full effect within 15  minutes of injected.  Duration: It will depend on the type of local anesthetic used. On the average, 1 to 8 hours.   From steroids: Decrease in swelling or inflammation. Once inflammation is improved, relief of the pain will follow.  Onset of benefits: Depends on the amount of swelling present. The more swelling, the longer it will take for the benefits to be seen. In some cases, up to 10 days.  Duration: Steroids will stay in the system x 2 weeks. Duration of benefits will depend on multiple posibilities including persistent irritating factors.  Occasional side-effects: Facial flushing, cramps (if present, drink Gatorade and take over-the-counter Magnesium 450-500 mg once to twice a day).  From procedure: Some discomfort is to be expected once the numbing medicine wears off. This should be minimal if ice and heat are applied as instructed.  Call if:  You experience numbness and weakness that gets worse with time, as opposed to wearing off.  New onset bowel or bladder incontinence. (This applies to Spinal procedures only)  Emergency Numbers:  Durning business hours (Monday - Thursday, 8:00 AM - 4:00 PM) (Friday, 9:00 AM - 12:00 Noon): [phone]  After hours: [phone] ____________________________________________________________________________________________   ____________________________________________________________________________________________  Preparing for your procedure (without sedation)  Instructions: . Oral Intake: Do not eat or drink anything for at least 3 hours prior to your procedure. . Transportation: Unless otherwise stated by your physician, you may drive yourself after the procedure. . Blood Pressure Medicine: Take your blood pressure medicine with a sip of water the morning of the procedure. . Blood thinners: Notify our staff if you are taking any blood thinners. Depending on which   one you take, there will be specific instructions on how and when  to stop it. . Diabetics on insulin: Notify the staff so that you can be scheduled 1st case in the morning. If your diabetes requires high dose insulin, take only  of your normal insulin dose the morning of the procedure and notify the staff that you have done so. . Preventing infections: Shower with an antibacterial soap the morning of your procedure.  . Build-up your immune system: Take 1000 mg of Vitamin C with every meal (3 times a day) the day prior to your procedure. . Antibiotics: Inform the staff if you have a condition or reason that requires you to take antibiotics before dental procedures. . Pregnancy: If you are pregnant, call and cancel the procedure. . Sickness: If you have a cold, fever, or any active infections, call and cancel the procedure. . Arrival: You must be in the facility at least 30 minutes prior to your scheduled procedure. . Children: Do not bring any children with you. . Dress appropriately: Bring dark clothing that you would not mind if they get stained. . Valuables: Do not bring any jewelry or valuables.  Procedure appointments are reserved for interventional treatments only. . No Prescription Refills. . No medication changes will be discussed during procedure appointments. . No disability issues will be discussed.  Reasons to call and reschedule or cancel your procedure: (Following these recommendations will minimize the risk of a serious complication.) . Surgeries: Avoid having procedures within 2 weeks of any surgery. (Avoid for 2 weeks before or after any surgery). . Flu Shots: Avoid having procedures within 2 weeks of a flu shots or . (Avoid for 2 weeks before or after immunizations). . Barium: Avoid having a procedure within 7-10 days after having had a radiological study involving the use of radiological contrast. (Myelograms, Barium swallow or enema study). . Heart attacks: Avoid any elective procedures or surgeries for the initial 6 months after a  "Myocardial Infarction" (Heart Attack). . Blood thinners: It is imperative that you stop these medications before procedures. Let us know if you if you take any blood thinner.  . Infection: Avoid procedures during or within two weeks of an infection (including chest colds or gastrointestinal problems). Symptoms associated with infections include: Localized redness, fever, chills, night sweats or profuse sweating, burning sensation when voiding, cough, congestion, stuffiness, runny nose, sore throat, diarrhea, nausea, vomiting, cold or Flu symptoms, recent or current infections. It is specially important if the infection is over the area that we intend to treat. . Heart and lung problems: Symptoms that may suggest an active cardiopulmonary problem include: cough, chest pain, breathing difficulties or shortness of breath, dizziness, ankle swelling, uncontrolled high or unusually low blood pressure, and/or palpitations. If you are experiencing any of these symptoms, cancel your procedure and contact your primary care physician for an evaluation.  Remember:  Regular Business hours are:  Monday to Thursday 8:00 AM to 4:00 PM  Provider's Schedule: Mardy Lucier, MD:  Procedure days: Tuesday and Thursday 7:30 AM to 4:00 PM  Bilal Lateef, MD:  Procedure days: Monday and Wednesday 7:30 AM to 4:00 PM ____________________________________________________________________________________________    

## 2018-06-05 ENCOUNTER — Other Ambulatory Visit: Payer: Self-pay

## 2018-06-05 ENCOUNTER — Encounter: Payer: Self-pay | Admitting: Pain Medicine

## 2018-06-05 ENCOUNTER — Ambulatory Visit
Admission: RE | Admit: 2018-06-05 | Discharge: 2018-06-05 | Disposition: A | Discharge status: Home / Self Care | Payer: Medicaid Other | Source: Ambulatory Visit | Attending: Pain Medicine | Admitting: Pain Medicine

## 2018-06-05 ENCOUNTER — Ambulatory Visit: Payer: Medicaid Other | Admitting: Pain Medicine

## 2018-06-05 VITALS — BP 140/80 | HR 100 | Temp 98.5°F | Resp 22 | Ht 64.0 in | Wt 340.0 lb

## 2018-06-05 DIAGNOSIS — M25562 Pain in left knee: Secondary | ICD-10-CM

## 2018-06-05 DIAGNOSIS — M170 Bilateral primary osteoarthritis of knee: Secondary | ICD-10-CM | POA: Diagnosis not present

## 2018-06-05 DIAGNOSIS — M25561 Pain in right knee: Secondary | ICD-10-CM

## 2018-06-05 DIAGNOSIS — G8929 Other chronic pain: Secondary | ICD-10-CM

## 2018-06-05 MED ORDER — ROPIVACAINE HCL 2 MG/ML IJ SOLN
4.0000 mL | Freq: Once | INTRAMUSCULAR | Status: AC
  Administered 2018-06-05: 10 mL via INTRA_ARTICULAR
  Filled 2018-06-05: qty 10

## 2018-06-05 MED ORDER — LIDOCAINE HCL (PF) 1 % IJ SOLN
4.0000 mL | Freq: Once | INTRAMUSCULAR | Status: AC
  Administered 2018-06-05: 5 mL
  Filled 2018-06-05: qty 5

## 2018-06-05 MED ORDER — SODIUM HYALURONATE (VISCOSUP) 20 MG/2ML IX SOSY
2.0000 mL | PREFILLED_SYRINGE | Freq: Once | INTRA_ARTICULAR | Status: AC
  Administered 2018-06-05: 09:00:00 via INTRA_ARTICULAR

## 2018-06-05 MED ORDER — LIDOCAINE HCL 2 % IJ SOLN: 20.0000 mL | Freq: Once | INTRAMUSCULAR | Status: DC

## 2018-06-05 NOTE — Progress Notes (Signed)
Safety precautions to be maintained throughout the outpatient stay will include: orient to surroundings, keep bed in low position, maintain call bell within reach at all times, provide assistance with transfer out of bed and ambulation.  

## 2018-06-06 ENCOUNTER — Telehealth: Payer: Self-pay | Admitting: *Deleted

## 2018-06-06 NOTE — Telephone Encounter (Signed)
No answer and no voicemail cabability, :"mailbox full"

## 2018-06-19 ENCOUNTER — Encounter: Payer: Self-pay | Admitting: Pain Medicine

## 2018-06-19 ENCOUNTER — Ambulatory Visit (HOSPITAL_BASED_OUTPATIENT_CLINIC_OR_DEPARTMENT_OTHER): Payer: Medicaid Other | Admitting: Pain Medicine

## 2018-06-19 ENCOUNTER — Ambulatory Visit
Admission: RE | Admit: 2018-06-19 | Discharge: 2018-06-19 | Disposition: A | Discharge status: Home / Self Care | Payer: Medicaid Other | Source: Ambulatory Visit | Attending: Pain Medicine | Admitting: Pain Medicine

## 2018-06-19 ENCOUNTER — Other Ambulatory Visit: Payer: Self-pay

## 2018-06-19 VITALS — BP 110/89 | HR 82 | Temp 98.7°F | Resp 21 | Ht 64.0 in | Wt 340.0 lb

## 2018-06-19 DIAGNOSIS — Z79899 Other long term (current) drug therapy: Secondary | ICD-10-CM | POA: Diagnosis not present

## 2018-06-19 DIAGNOSIS — Z9049 Acquired absence of other specified parts of digestive tract: Secondary | ICD-10-CM | POA: Diagnosis not present

## 2018-06-19 DIAGNOSIS — M170 Bilateral primary osteoarthritis of knee: Secondary | ICD-10-CM

## 2018-06-19 DIAGNOSIS — Z7982 Long term (current) use of aspirin: Secondary | ICD-10-CM | POA: Diagnosis not present

## 2018-06-19 DIAGNOSIS — M25561 Pain in right knee: Secondary | ICD-10-CM | POA: Diagnosis present

## 2018-06-19 DIAGNOSIS — M5136 Other intervertebral disc degeneration, lumbar region: Secondary | ICD-10-CM

## 2018-06-19 DIAGNOSIS — Z791 Long term (current) use of non-steroidal anti-inflammatories (NSAID): Secondary | ICD-10-CM | POA: Diagnosis not present

## 2018-06-19 DIAGNOSIS — G8929 Other chronic pain: Secondary | ICD-10-CM | POA: Insufficient documentation

## 2018-06-19 DIAGNOSIS — M25562 Pain in left knee: Secondary | ICD-10-CM

## 2018-06-19 DIAGNOSIS — Z6841 Body Mass Index (BMI) 40.0 and over, adult: Secondary | ICD-10-CM | POA: Insufficient documentation

## 2018-06-19 MED ORDER — ROPIVACAINE HCL 2 MG/ML IJ SOLN
INTRAMUSCULAR | Status: AC
  Filled 2018-06-19: qty 10

## 2018-06-19 MED ORDER — LIDOCAINE HCL (PF) 1 % IJ SOLN
4.0000 mL | Freq: Once | INTRAMUSCULAR | Status: AC
  Administered 2018-06-19: 5 mL

## 2018-06-19 MED ORDER — SODIUM HYALURONATE (VISCOSUP) 20 MG/2ML IX SOSY
2.0000 mL | PREFILLED_SYRINGE | Freq: Once | INTRA_ARTICULAR | Status: AC
  Administered 2018-06-19: 09:00:00 via INTRA_ARTICULAR

## 2018-06-19 MED ORDER — LIDOCAINE HCL (PF) 1 % IJ SOLN
INTRAMUSCULAR | Status: AC
  Filled 2018-06-19: qty 10

## 2018-06-19 MED ORDER — ROPIVACAINE HCL 2 MG/ML IJ SOLN
4.0000 mL | Freq: Once | INTRAMUSCULAR | Status: AC
  Administered 2018-06-19: 10 mL via INTRA_ARTICULAR

## 2018-06-19 NOTE — Patient Instructions (Addendum)
____________________________________________________________________________________________  Post-Procedure Discharge Instructions  Instructions:  Apply ice: Fill a plastic sandwich bag with crushed ice. Cover it with a small towel and apply to injection site. Apply for 15 minutes then remove x 15 minutes. Repeat sequence on day of procedure, until you go to bed. The purpose is to minimize swelling and discomfort after procedure.  Apply heat: Apply heat to procedure site starting the day following the procedure. The purpose is to treat any soreness and discomfort from the procedure.  Food intake: Start with clear liquids (like water) and advance to regular food, as tolerated.   Physical activities: Keep activities to a minimum for the first 8 hours after the procedure.   Driving: If you have received any sedation, you are not allowed to drive for 24 hours after your procedure.  Blood thinner: Restart your blood thinner 6 hours after your procedure. (Only for those taking blood thinners)  Insulin: As soon as you can eat, you may resume your normal dosing schedule. (Only for those taking insulin)  Infection prevention: Keep procedure site clean and dry.  Post-procedure Pain Diary: Extremely important that this be done correctly and accurately. Recorded information will be used to determine the next step in treatment.  Pain evaluated is that of treated area only. Do not include pain from an untreated area.  Complete every hour, on the hour, for the initial 8 hours. Set an alarm to help you do this part accurately.  Do not go to sleep and have it completed later. It will not be accurate.  Follow-up appointment: Keep your follow-up appointment after the procedure. Usually 2 weeks for most procedures. (6 weeks in the case of radiofrequency.) Bring you pain diary.   Expect:  From numbing medicine (AKA: Local Anesthetics): Numbness or decrease in pain.  Onset: Full effect within 15  minutes of injected.  Duration: It will depend on the type of local anesthetic used. On the average, 1 to 8 hours.   From steroids: Decrease in swelling or inflammation. Once inflammation is improved, relief of the pain will follow.  Onset of benefits: Depends on the amount of swelling present. The more swelling, the longer it will take for the benefits to be seen. In some cases, up to 10 days.  Duration: Steroids will stay in the system x 2 weeks. Duration of benefits will depend on multiple posibilities including persistent irritating factors.  Occasional side-effects: Facial flushing, cramps (if present, drink Gatorade and take over-the-counter Magnesium 450-500 mg once to twice a day).  From procedure: Some discomfort is to be expected once the numbing medicine wears off. This should be minimal if ice and heat are applied as instructed.  Call if:  You experience numbness and weakness that gets worse with time, as opposed to wearing off.  New onset bowel or bladder incontinence. (This applies to Spinal procedures only)  Emergency Numbers:  Durning business hours (Monday - Thursday, 8:00 AM - 4:00 PM) (Friday, 9:00 AM - 12:00 Noon): [phone]  After hours: [phone] ____________________________________________________________________________________________   ____________________________________________________________________________________________  Preparing for your procedure (without sedation)  Instructions: . Oral Intake: Do not eat or drink anything for at least 3 hours prior to your procedure. . Transportation: Unless otherwise stated by your physician, you may drive yourself after the procedure. . Blood Pressure Medicine: Take your blood pressure medicine with a sip of water the morning of the procedure. . Blood thinners: Notify our staff if you are taking any blood thinners. Depending on which   one you take, there will be specific instructions on how and when  to stop it. . Diabetics on insulin: Notify the staff so that you can be scheduled 1st case in the morning. If your diabetes requires high dose insulin, take only  of your normal insulin dose the morning of the procedure and notify the staff that you have done so. . Preventing infections: Shower with an antibacterial soap the morning of your procedure.  . Build-up your immune system: Take 1000 mg of Vitamin C with every meal (3 times a day) the day prior to your procedure. Marland Kitchen Antibiotics: Inform the staff if you have a condition or reason that requires you to take antibiotics before dental procedures. . Pregnancy: If you are pregnant, call and cancel the procedure. . Sickness: If you have a cold, fever, or any active infections, call and cancel the procedure. . Arrival: You must be in the facility at least 30 minutes prior to your scheduled procedure. . Children: Do not bring any children with you. . Dress appropriately: Bring dark clothing that you would not mind if they get stained. . Valuables: Do not bring any jewelry or valuables.  Procedure appointments are reserved for interventional treatments only. Marland Kitchen No Prescription Refills. . No medication changes will be discussed during procedure appointments. . No disability issues will be discussed.  Reasons to call and reschedule or cancel your procedure: (Following these recommendations will minimize the risk of a serious complication.) . Surgeries: Avoid having procedures within 2 weeks of any surgery. (Avoid for 2 weeks before or after any surgery). . Flu Shots: Avoid having procedures within 2 weeks of a flu shots or . (Avoid for 2 weeks before or after immunizations). . Barium: Avoid having a procedure within 7-10 days after having had a radiological study involving the use of radiological contrast. (Myelograms, Barium swallow or enema study). . Heart attacks: Avoid any elective procedures or surgeries for the initial 6 months after a  "Myocardial Infarction" (Heart Attack). . Blood thinners: It is imperative that you stop these medications before procedures. Let us know if you if you take any blood thinner.  . Infection: Avoid procedures during or within two weeks of an infection (including chest colds or gastrointestinal problems). Symptoms associated with infections include: Localized redness, fever, chills, night sweats or profuse sweating, burning sensation when voiding, cough, congestion, stuffiness, runny nose, sore throat, diarrhea, nausea, vomiting, cold or Flu symptoms, recent or current infections. It is specially important if the infection is over the area that we intend to treat. Marland Kitchen Heart and lung problems: Symptoms that may suggest an active cardiopulmonary problem include: cough, chest pain, breathing difficulties or shortness of breath, dizziness, ankle swelling, uncontrolled high or unusually low blood pressure, and/or palpitations. If you are experiencing any of these symptoms, cancel your procedure and contact your primary care physician for an evaluation.  Remember:  Regular Business hours are:  Monday to Thursday 8:00 AM to 4:00 PM  Provider's Schedule: Milinda Pointer, MD:  Procedure days: Tuesday and Thursday 7:30 AM to 4:00 PM  Gillis Santa, MD:  Procedure days: Monday and Wednesday 7:30 AM to 4:00 PM ____________________________________________________________________________________________   ____________________________________________________________________________________________  Preparing for Procedure with Sedation  Instructions: . Oral Intake: Do not eat or drink anything for at least 8 hours prior to your procedure. . Transportation: Public transportation is not allowed. Bring an adult driver. The driver must be physically present in our waiting room before any procedure can be started. Marland Kitchen Physical Assistance:  Bring an adult physically capable of assisting you, in the event you need help.  This adult should keep you company at home for at least 6 hours after the procedure. . Blood Pressure Medicine: Take your blood pressure medicine with a sip of water the morning of the procedure. . Blood thinners: Notify our staff if you are taking any blood thinners. Depending on which one you take, there will be specific instructions on how and when to stop it. . Diabetics on insulin: Notify the staff so that you can be scheduled 1st case in the morning. If your diabetes requires high dose insulin, take only  of your normal insulin dose the morning of the procedure and notify the staff that you have done so. . Preventing infections: Shower with an antibacterial soap the morning of your procedure. . Build-up your immune system: Take 1000 mg of Vitamin C with every meal (3 times a day) the day prior to your procedure. Marland Kitchen Antibiotics: Inform the staff if you have a condition or reason that requires you to take antibiotics before dental procedures. . Pregnancy: If you are pregnant, call and cancel the procedure. . Sickness: If you have a cold, fever, or any active infections, call and cancel the procedure. . Arrival: You must be in the facility at least 30 minutes prior to your scheduled procedure. . Children: Do not bring children with you. . Dress appropriately: Bring dark clothing that you would not mind if they get stained. . Valuables: Do not bring any jewelry or valuables.  Procedure appointments are reserved for interventional treatments only. Marland Kitchen No Prescription Refills. . No medication changes will be discussed during procedure appointments. . No disability issues will be discussed.  Reasons to call and reschedule or cancel your procedure: (Following these recommendations will minimize the risk of a serious complication.) . Surgeries: Avoid having procedures within 2 weeks of any surgery. (Avoid for 2 weeks before or after any surgery). . Flu Shots: Avoid having procedures within 2 weeks of  a flu shots or . (Avoid for 2 weeks before or after immunizations). . Barium: Avoid having a procedure within 7-10 days after having had a radiological study involving the use of radiological contrast. (Myelograms, Barium swallow or enema study). . Heart attacks: Avoid any elective procedures or surgeries for the initial 6 months after a "Myocardial Infarction" (Heart Attack). . Blood thinners: It is imperative that you stop these medications before procedures. Let us know if you if you take any blood thinner.  . Infection: Avoid procedures during or within two weeks of an infection (including chest colds or gastrointestinal problems). Symptoms associated with infections include: Localized redness, fever, chills, night sweats or profuse sweating, burning sensation when voiding, cough, congestion, stuffiness, runny nose, sore throat, diarrhea, nausea, vomiting, cold or Flu symptoms, recent or current infections. It is specially important if the infection is over the area that we intend to treat. Marland Kitchen Heart and lung problems: Symptoms that may suggest an active cardiopulmonary problem include: cough, chest pain, breathing difficulties or shortness of breath, dizziness, ankle swelling, uncontrolled high or unusually low blood pressure, and/or palpitations. If you are experiencing any of these symptoms, cancel your procedure and contact your primary care physician for an evaluation.  Remember:  Regular Business hours are:  Monday to Thursday 8:00 AM to 4:00 PM  Provider's Schedule: Milinda Pointer, MD:  Procedure days: Tuesday and Thursday 7:30 AM to 4:00 PM  Gillis Santa, MD:  Procedure days: Monday and Wednesday 7:30 AM  to 4:00 PM ____________________________________________________________________________________________

## 2018-06-19 NOTE — Progress Notes (Signed)
Safety precautions to be maintained throughout the outpatient stay will include: orient to surroundings, keep bed in low position, maintain call bell within reach at all times, provide assistance with transfer out of bed and ambulation.  

## 2018-06-19 NOTE — Progress Notes (Signed)
Patient's Name: Lauren [name]  MRN: [id number]  Referring Provider: No ref. provider found  DOB: 08-24-1976  PCP: Patient, No Pcp Per  DOS: 06/19/2018  Note by: Gaspar Cola, MD  Service setting: Ambulatory outpatient  Specialty: Interventional Pain Management  Patient type: Established  Location: [facility] (AMB) Pain Management Facility  Visit type: Interventional Procedure   Primary Reason for Visit: Interventional Pain Management Treatment. CC: Knee Pain (bilateral)  Procedure:          Anesthesia, Analgesia, Anxiolysis:  Type: Therapeutic Intra-Articular Hyalgan Knee Injection #3  Region: Lateral infrapatellar Knee Region Level: Knee Joint Laterality: Bilateral  Type: Local Anesthesia Indication(s): Analgesia         Local Anesthetic: Lidocaine 1-2% Route: Infiltration ([place]/IM) IV Access: Declined Sedation: Declined   Position: Supine w/ knee bent 20 to 30 degrees   Indications: 1. Osteoarthritis of knee (Bilateral)   2. Chronic knee pain (Primary Area of Pain) (Bilateral) (L>R)    Pain Score: Pre-procedure: 3 /10 Post-procedure: 0-No pain/10  Pre-op Assessment:  Lauren [name] is a 42 y.o. (year old), female patient, seen today for interventional treatment. She  has a past surgical history that includes Rotator cuff repair; Knee surgery; Cholecystectomy; Cesarean section (2010); Cesarean section (2011); Tonsillectomy; and Cardiac electrophysiology study and ablation (01/18/2018). Lauren [name] has a current medication list which includes the following prescription(s): acetaminophen, aspirin ec, vitamin d-3, diltiazem, esomeprazole, furosemide, ibuprofen, medroxyprogesterone acetate, stiolto respimat, tizanidine, diazepam, gabapentin, and meloxicam. Her primarily concern today is the Knee Pain (bilateral)  Initial Vital Signs:  Pulse/HCG Rate: 82ECG Heart Rate: 96 Temp: 98.7 F (37.1 C) Resp: 16 BP: (!) 143/74 SpO2: 96 %  BMI: Estimated body mass index is 58.36 kg/m as  calculated from the following:   Height as of this encounter: 5\' 4"  (1.626 m).   Weight as of this encounter: 340 lb (154.2 kg).  Risk Assessment: Allergies: Reviewed. She is allergic to nucynta [tapentadol].  Allergy Precautions: None required Coagulopathies: Reviewed. None identified.  Blood-thinner therapy: None at this time Active Infection(s): Reviewed. None identified. Lauren [name] is afebrile  Site Confirmation: Lauren [name] was asked to confirm the procedure and laterality before marking the site Procedure checklist: Completed Consent: Before the procedure and under the influence of no sedative(s), amnesic(s), or anxiolytics, the patient was informed of the treatment options, risks and possible complications. To fulfill our ethical and legal obligations, as recommended by the American Medical Association's Code of Ethics, I have informed the patient of my clinical impression; the nature and purpose of the treatment or procedure; the risks, benefits, and possible complications of the intervention; the alternatives, including doing nothing; the risk(s) and benefit(s) of the alternative treatment(s) or procedure(s); and the risk(s) and benefit(s) of doing nothing. The patient was provided information about the general risks and possible complications associated with the procedure. These may include, but are not limited to: failure to achieve desired goals, infection, bleeding, organ or nerve damage, allergic reactions, paralysis, and death. In addition, the patient was informed of those risks and complications associated to the procedure, such as failure to decrease pain; infection; bleeding; organ or nerve damage with subsequent damage to sensory, motor, and/or autonomic systems, resulting in permanent pain, numbness, and/or weakness of one or several areas of the body; allergic reactions; (i.e.: anaphylactic reaction); and/or death. Furthermore, the patient was informed of those risks and  complications associated with the medications. These include, but are not limited to: allergic reactions (i.e.: anaphylactic or anaphylactoid reaction(s)); adrenal  axis suppression; blood sugar elevation that in diabetics may result in ketoacidosis or comma; water retention that in patients with history of congestive heart failure may result in shortness of breath, pulmonary edema, and decompensation with resultant heart failure; weight gain; swelling or edema; medication-induced neural toxicity; particulate matter embolism and blood vessel occlusion with resultant organ, and/or nervous system infarction; and/or aseptic necrosis of one or more joints. Finally, the patient was informed that Medicine is not an exact science; therefore, there is also the possibility of unforeseen or unpredictable risks and/or possible complications that may result in a catastrophic outcome. The patient indicated having understood very clearly. We have given the patient no guarantees and we have made no promises. Enough time was given to the patient to ask questions, all of which were answered to the patient's satisfaction. Lauren [name] has indicated that she wanted to continue with the procedure. Attestation: I, the ordering provider, attest that I have discussed with the patient the benefits, risks, side-effects, alternatives, likelihood of achieving goals, and potential problems during recovery for the procedure that I have provided informed consent. Date  Time: 06/19/2018  8:10 AM  Pre-Procedure Preparation:  Monitoring: As per clinic protocol. Respiration, ETCO2, SpO2, BP, heart rate and rhythm monitor placed and checked for adequate function Safety Precautions: Patient was assessed for positional comfort and pressure points before starting the procedure. Time-out: I initiated and conducted the "Time-out" before starting the procedure, as per protocol. The patient was asked to participate by confirming the accuracy of the  "Time Out" information. Verification of the correct person, site, and procedure were performed and confirmed by me, the nursing staff, and the patient. "Time-out" conducted as per Joint Commission's Universal Protocol (UP.01.01.01). Time: 0855  Description of Procedure:          Target Area: Knee Joint Approach: Just above the Lateral tibial plateau, lateral to the infrapatellar tendon. Area Prepped: Entire knee area, from the mid-thigh to the mid-shin. Prepping solution: ChloraPrep (2% chlorhexidine gluconate and 70% isopropyl alcohol) Safety Precautions: Aspiration looking for blood return was conducted prior to all injections. At no point did we inject any substances, as a needle was being advanced. No attempts were made at seeking any paresthesias. Safe injection practices and needle disposal techniques used. Medications properly checked for expiration dates. SDV (single dose vial) medications used. Description of the Procedure: Protocol guidelines were followed. The patient was placed in position over the fluoroscopy table. The target area was identified and the area prepped in the usual manner. Skin & deeper tissues infiltrated with local anesthetic. Appropriate amount of time allowed to pass for local anesthetics to take effect. The procedure needles were then advanced to the target area. Proper needle placement secured. Negative aspiration confirmed. Solution injected in intermittent fashion, asking for systemic symptoms every 0.5cc of injectate. The needles were then removed and the area cleansed, making sure to leave some of the prepping solution back to take advantage of its long term bactericidal properties. Vitals:   06/19/18 0855 06/19/18 0901 06/19/18 0905 06/19/18 0909  BP: (!) 141/77 (!) 153/132 109/86 110/89  Pulse:      Resp: (!) 24 (!) 29 (!) 27 (!) 21  Temp:      TempSrc:      SpO2: 100% 99% 100% 98%  Weight:      Height:        Start Time: 0855 hrs. End Time: 0906  hrs. Materials:  Needle(s) Type: Regular needle Gauge: 22G Length: 3.5-in Medication(s): Please  see orders for medications and dosing details. Imaging Guidance:          Type of Imaging Technique: Fluoroscopy Guidance (Non-spinal) Indication(s): Morbid obesity. Assistance in needle guidance and placement for procedures requiring needle placement in or near specific anatomical locations impossible to access without such assistance. Exposure Time: Please see nurses notes. Contrast: None used. Fluoroscopic Guidance: I was personally present during the use of fluoroscopy. "Tunnel Vision Technique" used to obtain the best possible view of the target area. Parallax error corrected before commencing the procedure. "Direction-depth-direction" technique used to introduce the needle under continuous pulsed fluoroscopy. Once target was reached, antero-posterior, oblique, and lateral fluoroscopic projection used confirm needle placement in all planes. Images permanently stored in EMR. Ultrasound Guidance: N/A Interpretation: No contrast injected. I personally interpreted the imaging intraoperatively. Adequate needle placement confirmed in multiple planes. Permanent images saved into the patient's record.  Antibiotic Prophylaxis:   Anti-infectives (From admission, onward)   None     Indication(s): None identified  Post-operative Assessment:  Post-procedure Vital Signs:  Pulse/HCG Rate: 8287 Temp: 98.7 F (37.1 C) Resp: (!) 21 BP: 110/89 SpO2: 98 %  EBL: None  Complications: No immediate post-treatment complications observed by team, or reported by patient.  Note: The patient tolerated the entire procedure well. A repeat set of vitals were taken after the procedure and the patient was kept under observation following institutional policy, for this type of procedure. Post-procedural neurological assessment was performed, showing return to baseline, prior to discharge. The patient was provided  with post-procedure discharge instructions, including a section on how to identify potential problems. Should any problems arise concerning this procedure, the patient was given instructions to immediately contact us, at any time, without hesitation. In any case, we plan to contact the patient by telephone for a follow-up status report regarding this interventional procedure.  Comments:  No additional relevant information.  Plan of Care    Imaging Orders     DG C-Arm 1-60 Min-No Report  Procedure Orders     KNEE INJECTION     KNEE INJECTION     Lumbar Epidural Injection  Medications ordered for procedure: Meds ordered this encounter  Medications  . lidocaine (PF) (XYLOCAINE) 1 % injection 4 mL  . ropivacaine (PF) 2 mg/mL (0.2%) (NAROPIN) injection 4 mL  . Sodium Hyaluronate SOSY 2 mL  . Sodium Hyaluronate SOSY 2 mL   Medications administered: We administered lidocaine (PF), ropivacaine (PF) 2 mg/mL (0.2%), Sodium Hyaluronate, and Sodium Hyaluronate.  See the medical record for exact dosing, route, and time of administration.  Disposition: Discharge home  Discharge Date & Time: 06/19/2018; 0914 hrs.   Physician-requested Follow-up: Return for PPE (2 wks) + Procedure (w/ sedation): (L) L4-5 LESI #2 + (B) Hyalgan #4.  Future Appointments  Date Time Provider Belt  06/26/2018  2:40 PM Hubbard Hartshorn, FNP [facility]  07/02/2018  8:30 AM Vevelyn Francois, NP [facility] None  07/03/2018  2:20 PM Glean Hess, MD [facility] None  07/05/2018  8:00 AM Milinda Pointer, MD [facility] None  05/13/2019  3:00 PM Lawhorn, Lara Mulch, CNM [facility] None   Primary Care Physician: Patient, No Pcp Per Location: [facility] Note by: Gaspar Cola, MD Date: 06/19/2018; Time: 11:27 AM  Disclaimer:  Medicine is not an exact science. The only guarantee in medicine is that nothing is guaranteed. It is important to note that the decision to  proceed with this intervention was based on the information  collected from the patient. The Data and conclusions were drawn from the patient's questionnaire, the interview, and the physical examination. Because the information was provided in large part by the patient, it cannot be guaranteed that it has not been purposely or unconsciously manipulated. Every effort has been made to obtain as much relevant data as possible for this evaluation. It is important to note that the conclusions that lead to this procedure are derived in large part from the available data. Always take into account that the treatment will also be dependent on availability of resources and existing treatment guidelines, considered by other Pain Management Practitioners as being common knowledge and practice, at the time of the intervention. For Medico-Legal purposes, it is also important to point out that variation in procedural techniques and pharmacological choices are the acceptable norm. The indications, contraindications, technique, and results of the above procedure should only be interpreted and judged by a Board-Certified Interventional Pain Specialist with extensive familiarity and expertise in the same exact procedure and technique.

## 2018-06-20 ENCOUNTER — Telehealth: Payer: Self-pay | Admitting: *Deleted

## 2018-06-20 NOTE — Telephone Encounter (Signed)
No problems post procedure. 

## 2018-06-25 ENCOUNTER — Emergency Department
Admission: EM | Admit: 2018-06-25 | Discharge: 2018-06-26 | Disposition: A | Discharge status: Home / Self Care | Payer: Medicaid Other | Attending: Emergency Medicine | Admitting: Emergency Medicine

## 2018-06-25 ENCOUNTER — Encounter: Payer: Self-pay | Admitting: *Deleted

## 2018-06-25 ENCOUNTER — Emergency Department: Payer: Medicaid Other

## 2018-06-25 ENCOUNTER — Other Ambulatory Visit: Payer: Self-pay

## 2018-06-25 DIAGNOSIS — J45909 Unspecified asthma, uncomplicated: Secondary | ICD-10-CM | POA: Diagnosis not present

## 2018-06-25 DIAGNOSIS — R0602 Shortness of breath: Secondary | ICD-10-CM | POA: Insufficient documentation

## 2018-06-25 DIAGNOSIS — R600 Localized edema: Secondary | ICD-10-CM | POA: Insufficient documentation

## 2018-06-25 DIAGNOSIS — Z87891 Personal history of nicotine dependence: Secondary | ICD-10-CM | POA: Insufficient documentation

## 2018-06-25 DIAGNOSIS — R609 Edema, unspecified: Secondary | ICD-10-CM

## 2018-06-25 DIAGNOSIS — I10 Essential (primary) hypertension: Secondary | ICD-10-CM | POA: Diagnosis not present

## 2018-06-25 MED ORDER — MORPHINE SULFATE (PF) 4 MG/ML IV SOLN
4.0000 mg | Freq: Once | INTRAVENOUS | Status: AC
  Administered 2018-06-25: 4 mg via INTRAVENOUS
  Filled 2018-06-25: qty 1

## 2018-06-25 MED ORDER — ALBUTEROL SULFATE (2.5 MG/3ML) 0.083% IN NEBU
5.0000 mg | INHALATION_SOLUTION | Freq: Once | RESPIRATORY_TRACT | Status: AC
  Administered 2018-06-25: 5 mg via RESPIRATORY_TRACT
  Filled 2018-06-25: qty 6

## 2018-06-25 MED ORDER — HALOPERIDOL LACTATE 5 MG/ML IJ SOLN
2.5000 mg | Freq: Once | INTRAMUSCULAR | Status: AC
  Administered 2018-06-25: 2.5 mg via INTRAVENOUS
  Filled 2018-06-25: qty 1

## 2018-06-25 MED ORDER — KETOROLAC TROMETHAMINE 30 MG/ML IJ SOLN
15.0000 mg | Freq: Once | INTRAMUSCULAR | Status: AC
  Administered 2018-06-25: 15 mg via INTRAVENOUS
  Filled 2018-06-25: qty 1

## 2018-06-25 NOTE — ED Triage Notes (Signed)
Pt c/o shortness of breath starting tonight. Pt c/o bilateral knee pain that is chronic and L calf pain that started today. Pt states her L calf is swollen and she has unbearable pain behind L knee and in L calf. Pt recently had surgery on 05/18/18, and a procedure done on both knees x 4 days ago where her knees were both injected with steroids.

## 2018-06-25 NOTE — ED Triage Notes (Signed)
Pt states in the past 1 day she has gained 9 lbs.
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
## 2018-06-26 ENCOUNTER — Encounter: Payer: Self-pay | Admitting: Family Medicine

## 2018-06-26 ENCOUNTER — Emergency Department: Payer: Medicaid Other

## 2018-06-26 ENCOUNTER — Ambulatory Visit: Payer: Medicaid Other | Admitting: Family Medicine

## 2018-06-26 VITALS — BP 140/76 | HR 100 | Temp 98.1°F | Resp 14 | Ht 64.0 in | Wt 340.7 lb

## 2018-06-26 DIAGNOSIS — I471 Supraventricular tachycardia: Secondary | ICD-10-CM | POA: Diagnosis not present

## 2018-06-26 DIAGNOSIS — R609 Edema, unspecified: Secondary | ICD-10-CM | POA: Diagnosis not present

## 2018-06-26 DIAGNOSIS — R002 Palpitations: Secondary | ICD-10-CM

## 2018-06-26 DIAGNOSIS — I10 Essential (primary) hypertension: Secondary | ICD-10-CM

## 2018-06-26 DIAGNOSIS — R079 Chest pain, unspecified: Secondary | ICD-10-CM

## 2018-06-26 DIAGNOSIS — R0602 Shortness of breath: Secondary | ICD-10-CM | POA: Diagnosis not present

## 2018-06-26 LAB — COMPREHENSIVE METABOLIC PANEL
ALBUMIN: 3.9 g/dL (ref 3.5–5.0)
ALK PHOS: 58 U/L (ref 38–126)
ALT: 14 U/L (ref 0–44)
AST: 18 U/L (ref 15–41)
Anion gap: 8 (ref 5–15)
BUN: 15 mg/dL (ref 6–20)
CHLORIDE: 108 mmol/L (ref 98–111)
CO2: 24 mmol/L (ref 22–32)
Calcium: 9.1 mg/dL (ref 8.9–10.3)
Creatinine, Ser: 0.74 mg/dL (ref 0.44–1.00)
GFR calc Af Amer: >60 mL/min (ref >60)
GFR calc non Af Amer: >60 mL/min (ref >60)
Glucose, Bld: 104 mg/dL — ABNORMAL HIGH (ref 70–99)
POTASSIUM: 3.9 mmol/L (ref 3.5–5.1)
SODIUM: 140 mmol/L (ref 135–145)
Total Bilirubin: 0.3 mg/dL (ref 0.3–1.2)
Total Protein: 8.2 g/dL — ABNORMAL HIGH (ref 6.5–8.1)

## 2018-06-26 LAB — FIBRIN DERIVATIVES D-DIMER (ARMC ONLY): Fibrin derivatives D-dimer (ARMC): 455.39 ng/mL (FEU) (ref 0.00–499.00)

## 2018-06-26 LAB — BRAIN NATRIURETIC PEPTIDE: B Natriuretic Peptide: 32 pg/mL (ref 0.0–100.0)

## 2018-06-26 LAB — CBC WITH DIFFERENTIAL/PLATELET
ABS IMMATURE GRANULOCYTES: 0.08 10*3/uL — AB (ref 0.00–0.07)
BASOS ABS: 0.1 10*3/uL (ref 0.0–0.1)
BASOS PCT: 0 %
Eosinophils Absolute: 0.2 10*3/uL (ref 0.0–0.5)
Eosinophils Relative: 2 %
HCT: 36.9 % (ref 36.0–46.0)
HEMOGLOBIN: 11.1 g/dL — AB (ref 12.0–15.0)
Immature Granulocytes: 1 %
LYMPHS PCT: 35 %
Lymphs Abs: 5.1 10*3/uL — ABNORMAL HIGH (ref 0.7–4.0)
MCH: 23.5 pg — ABNORMAL LOW (ref 26.0–34.0)
MCHC: 30.1 g/dL (ref 30.0–36.0)
MCV: 78 fL — ABNORMAL LOW (ref 80.0–100.0)
Monocytes Absolute: 0.8 10*3/uL (ref 0.1–1.0)
Monocytes Relative: 6 %
NEUTROS ABS: 8.4 10*3/uL — AB (ref 1.7–7.7)
NEUTROS PCT: 56 %
NRBC: 0 % (ref 0.0–0.2)
PLATELETS: 514 10*3/uL — AB (ref 150–400)
RBC: 4.73 MIL/uL (ref 3.87–5.11)
RDW: 17.2 % — ABNORMAL HIGH (ref 11.5–15.5)
WBC: 14.6 10*3/uL — ABNORMAL HIGH (ref 4.0–10.5)

## 2018-06-26 LAB — HCG, QUANTITATIVE, PREGNANCY: HCG, BETA CHAIN, QUANT, S: 1 m[IU]/mL (ref <5)

## 2018-06-26 LAB — TROPONIN I: Troponin I: <0.03 ng/mL (ref <0.03)

## 2018-06-26 MED ORDER — HYDROCODONE-ACETAMINOPHEN 5-325 MG PO TABS: 1.0000 | ORAL_TABLET | Freq: Four times a day (QID) | ORAL | 0 refills | Status: DC | PRN

## 2018-06-26 MED ORDER — IBUPROFEN 600 MG PO TABS: 600.0000 mg | ORAL_TABLET | Freq: Three times a day (TID) | ORAL | 0 refills | Status: DC | PRN

## 2018-06-26 NOTE — ED Notes (Addendum)
Patient transported to Ultrasound 

## 2018-06-26 NOTE — Patient Instructions (Signed)
Please go to see Dr. Clayborn Bigness at 9:15am tomorrow 06/27/2018

## 2018-06-26 NOTE — Discharge Instructions (Signed)
Fortunately today your blood work, your EKG, your x-ray, and your ultrasound were all reassuring.  Please take your pain medication as needed for severe symptoms and make an appointment to follow-up with primary care within the next week for recheck.  Return to the emergency department sooner for any concerns.  It was a pleasure to take care of you today, and thank you for coming to our emergency department.  If you have any questions or concerns before leaving please ask the nurse to grab me and I'm more than happy to go through your aftercare instructions again.  If you were prescribed any opioid pain medication today such as Norco, Vicodin, Percocet, morphine, hydrocodone, or oxycodone please make sure you do not drive when you are taking this medication as it can alter your ability to drive safely.  If you have any concerns once you are home that you are not improving or are in fact getting worse before you can make it to your follow-up appointment, please do not hesitate to call 911 and come back for further evaluation.  Darel Hong, MD  Results for orders placed or performed during the hospital encounter of 06/25/18  Comprehensive metabolic panel  Result Value Ref Range   Sodium 140 135 - 145 mmol/L   Potassium 3.9 3.5 - 5.1 mmol/L   Chloride 108 98 - 111 mmol/L   CO2 24 22 - 32 mmol/L   Glucose, Bld 104 (H) 70 - 99 mg/dL   BUN 15 6 - 20 mg/dL   Creatinine, Ser 0.74 0.44 - 1.00 mg/dL   Calcium 9.1 8.9 - 10.3 mg/dL   Total Protein 8.2 (H) 6.5 - 8.1 g/dL   Albumin 3.9 3.5 - 5.0 g/dL   AST 18 15 - 41 U/L   ALT 14 0 - 44 U/L   Alkaline Phosphatase 58 38 - 126 U/L   Total Bilirubin 0.3 0.3 - 1.2 mg/dL   GFR calc non Af Amer >60 >60 mL/min   GFR calc Af Amer >60 >60 mL/min   Anion gap 8 5 - 15  Brain natriuretic peptide  Result Value Ref Range   B Natriuretic Peptide 32.0 0.0 - 100.0 pg/mL  Troponin I  Result Value Ref Range   Troponin I <0.03 <0.03 ng/mL  CBC with Differential   Result Value Ref Range   WBC 14.6 (H) 4.0 - 10.5 K/uL   RBC 4.73 3.87 - 5.11 MIL/uL   Hemoglobin 11.1 (L) 12.0 - 15.0 g/dL   HCT 36.9 36.0 - 46.0 %   MCV 78.0 (L) 80.0 - 100.0 fL   MCH 23.5 (L) 26.0 - 34.0 pg   MCHC 30.1 30.0 - 36.0 g/dL   RDW 17.2 (H) 11.5 - 15.5 %   Platelets 514 (H) 150 - 400 K/uL   nRBC 0.0 0.0 - 0.2 %   Neutrophils Relative % 56 %   Neutro Abs 8.4 (H) 1.7 - 7.7 K/uL   Lymphocytes Relative 35 %   Lymphs Abs 5.1 (H) 0.7 - 4.0 K/uL   Monocytes Relative 6 %   Monocytes Absolute 0.8 0.1 - 1.0 K/uL   Eosinophils Relative 2 %   Eosinophils Absolute 0.2 0.0 - 0.5 K/uL   Basophils Relative 0 %   Basophils Absolute 0.1 0.0 - 0.1 K/uL   Immature Granulocytes 1 %   Abs Immature Granulocytes 0.08 (H) 0.00 - 0.07 K/uL  Fibrin derivatives D-Dimer  Result Value Ref Range   Fibrin derivatives D-dimer ([facility]) 455.39 0.00 - 499.00  ng/mL (FEU)  hCG, quantitative, pregnancy  Result Value Ref Range   hCG, Beta Chain, Quant, S 1 <5 mIU/mL   Dg Chest 2 View  Result Date: 06/25/2018 CLINICAL DATA:  Left-sided chest pain for several months with dyspnea. EXAM: CHEST - 2 VIEW COMPARISON:  08/25/2017 FINDINGS: Cardiomegaly without aortic aneurysm. No pulmonary consolidation, effusion or pneumothorax. No overt pulmonary edema. No acute nor suspicious osseous abnormality. IMPRESSION: No active cardiopulmonary disease.  Stable mild cardiomegaly. Electronically Signed   By: Ashley Royalty M.D.   On: 06/25/2018 23:41   US Venous Img Lower Unilateral Left  Result Date: 06/26/2018 CLINICAL DATA:  Left calf pain and edema EXAM: Left LOWER EXTREMITY VENOUS DOPPLER ULTRASOUND TECHNIQUE: Gray-scale sonography with graded compression, as well as color Doppler and duplex ultrasound were performed to evaluate the lower extremity deep venous systems from the level of the common femoral vein and including the common femoral, femoral, profunda femoral, popliteal and calf veins including the posterior  tibial, peroneal and gastrocnemius veins when visible. The superficial great saphenous vein was also interrogated. Spectral Doppler was utilized to evaluate flow at rest and with distal augmentation maneuvers in the common femoral, femoral and popliteal veins. COMPARISON:  05/10/2018 FINDINGS: Contralateral Common Femoral Vein: Respiratory phasicity is normal and symmetric with the symptomatic side. No evidence of thrombus. Normal compressibility. Common Femoral Vein: No evidence of thrombus. Normal compressibility, respiratory phasicity and response to augmentation. Saphenofemoral Junction: No evidence of thrombus. Normal compressibility and flow on color Doppler imaging. Profunda Femoral Vein: No evidence of thrombus. Normal compressibility and flow on color Doppler imaging. Femoral Vein: No evidence of thrombus. Normal compressibility, respiratory phasicity and response to augmentation. Popliteal Vein: No evidence of thrombus. Normal compressibility, respiratory phasicity and response to augmentation. Calf Veins: No evidence of thrombus. Normal compressibility and flow on color Doppler imaging. IMPRESSION: No evidence of deep venous thrombosis. Electronically Signed   By: Donavan Foil M.D.   On: 06/26/2018 01:52   Dg C-arm 1-60 Min-no Report  Result Date: 06/19/2018 Fluoroscopy was utilized by the requesting physician.  No radiographic interpretation.   Dg C-arm 1-60 Min-no Report  Result Date: 06/05/2018 Fluoroscopy was utilized by the requesting physician.  No radiographic interpretation.
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
## 2018-07-02 ENCOUNTER — Encounter: Payer: Self-pay | Admitting: Nurse Practitioner

## 2018-07-03 ENCOUNTER — Ambulatory Visit: Payer: Medicaid Other | Admitting: Internal Medicine

## 2018-07-04 NOTE — Progress Notes (Signed)
Patient's Name: Lauren [name]  MRN: [id number]  Referring Provider: No ref. provider found  DOB: [birth year]  PCP: Hubbard Hartshorn, FNP  DOS: 07/05/2018  Note by: Gaspar Cola, MD  Service setting: Ambulatory outpatient  Specialty: Interventional Pain Management  Patient type: Established  Location: [facility] (AMB) Pain Management Facility  Visit type: Interventional Procedure   Primary Reason for Visit: Interventional Pain Management Treatment. CC: Back Pain (lower) and Knee Pain (bilateral)  Procedure #1:  Anesthesia, Analgesia, Anxiolysis:  Type: Therapeutic Intra-Articular Hyalgan Knee Injection #4  Region: Lateral infrapatellar Knee Region Level: Knee Joint Laterality: Bilateral  Type: Moderate (Conscious) Sedation combined with Local Anesthesia Indication(s): Analgesia and Anxiety Local Anesthetic: Lidocaine 1-2% Route: Intravenous (IV) IV Access: Secured Sedation: Meaningful verbal contact was maintained at all times during the procedure   Position: Sitting   Indications: 1. Osteoarthritis of knee (Bilateral)   2. Chronic knee pain (Primary Area of Pain) (Bilateral) (L>R)    Procedure #2:  Anesthesia, Analgesia, Anxiolysis:  Type: Therapeutic Inter-Laminar Epidural Steroid Injection  #2  Region: Lumbar Level: L4-5 Level. Laterality: Left-Sided Paramedial  Type: Moderate (Conscious) Sedation combined with Local Anesthesia Indication(s): Analgesia and Anxiety Route: Intravenous (IV) IV Access: Secured Sedation: Meaningful verbal contact was maintained at all times during the procedure  Local Anesthetic: Lidocaine 1-2%  Position: Prone with head of the table was raised to facilitate breathing.   Indications: 1. DDD (degenerative disc disease), lumbar   2. Chronic lower extremity pain (Bilateral)    Pain Score: Pre-procedure: 3 /10 Post-procedure: 0-No pain/10  Pre-op Assessment:  [name] is a 42 y.o. (year old), female patient, seen today for interventional  treatment. She  has a past surgical history that includes Rotator cuff repair; Knee surgery; Cholecystectomy; Cesarean section (2010); Cesarean section (2011); Tonsillectomy; and Cardiac electrophysiology study and ablation (01/18/2018). [name] has a current medication list which includes the following prescription(s): acetaminophen, aspirin ec, vitamin d-3, diltiazem, esomeprazole, furosemide, hydrocodone-acetaminophen, ibuprofen, medroxyprogesterone acetate, stiolto respimat, tizanidine, and meloxicam, and the following Facility-Administered Medications: fentanyl and midazolam. Her primarily concern today is the Back Pain (lower) and Knee Pain (bilateral)  The patient indicates recently having slipped and fallen while she was getting on the tub.  She indicates that she hit her back on the right side.  She is still having some pain in that area.  She denies having had any kind of x-rays for that.  Unfortunately the x-rays that we have here are not of diagnostic quality and therefore I will scan the area, but it is doubtful that I can pick up on any type of hairline fractures or small pathology.  I have recommended to the patient to consider the possibility of having an x-ray done today.  Initial Vital Signs:  Pulse/HCG Rate: 77ECG Heart Rate: 71 Temp: 98.7 F (37.1 C) Resp: 16 BP: (!) 148/107 SpO2: 100 %  BMI: Estimated body mass index is 58.36 kg/m as calculated from the following:   Height as of this encounter: 5\' 4"  (1.626 m).   Weight as of this encounter: 340 lb (154.2 kg).  Risk Assessment: Allergies: Reviewed. She is allergic to nucynta [tapentadol].  Allergy Precautions: None required Coagulopathies: Reviewed. None identified.  Blood-thinner therapy: None at this time Active Infection(s): Reviewed. None identified. [name] is afebrile  Site Confirmation: [name] was asked to confirm the procedure and laterality before marking the site Procedure checklist:  Completed Consent: Before the procedure and under the influence of no sedative(s), amnesic(s), or  anxiolytics, the patient was informed of the treatment options, risks and possible complications. To fulfill our ethical and legal obligations, as recommended by the American Medical Association's Code of Ethics, I have informed the patient of my clinical impression; the nature and purpose of the treatment or procedure; the risks, benefits, and possible complications of the intervention; the alternatives, including doing nothing; the risk(s) and benefit(s) of the alternative treatment(s) or procedure(s); and the risk(s) and benefit(s) of doing nothing. The patient was provided information about the general risks and possible complications associated with the procedure. These may include, but are not limited to: failure to achieve desired goals, infection, bleeding, organ or nerve damage, allergic reactions, paralysis, and death.  In addition, for the purpose of the 1st procedure, the patient was informed of those risks and complications associated to the procedure, such as failure to decrease pain; infection; bleeding; organ or nerve damage with subsequent damage to sensory, motor, and/or autonomic systems, resulting in permanent pain, numbness, and/or weakness of one or several areas of the body; allergic reactions; (i.e.: anaphylactic reaction); and/or death.  Additionally, for the purpose of the 2nd procedure, the patient was informed of those risks and complications associated to Spine-related procedures, such as failure to decrease pain; infection (i.e.: Meningitis, epidural or intraspinal abscess); bleeding (i.e.: epidural hematoma, subarachnoid hemorrhage, or any other type of intraspinal or peri-dural bleeding); organ or nerve damage (i.e.: Any type of peripheral nerve, nerve root, or spinal cord injury) with subsequent damage to sensory, motor, and/or autonomic systems, resulting in permanent pain,  numbness, and/or weakness of one or several areas of the body; allergic reactions; (i.e.: anaphylactic reaction); and/or death.  Furthermore, the patient was informed of those risks and complications associated with the medications. These include, but are not limited to: allergic reactions (i.e.: anaphylactic or anaphylactoid reaction(s)); adrenal axis suppression; blood sugar elevation that in diabetics may result in ketoacidosis or comma; water retention that in patients with history of congestive heart failure may result in shortness of breath, pulmonary edema, and decompensation with resultant heart failure; weight gain; swelling or edema; medication-induced neural toxicity; particulate matter embolism and blood vessel occlusion with resultant organ, and/or nervous system infarction; and/or aseptic necrosis of one or more joints. Finally, the patient was informed that Medicine is not an exact science; therefore, there is also the possibility of unforeseen or unpredictable risks and/or possible complications that may result in a catastrophic outcome. The patient indicated having understood very clearly. We have given the patient no guarantees and we have made no promises. Enough time was given to the patient to ask questions, all of which were answered to the patient's satisfaction. Ms. Verrill has indicated that she wanted to continue with the procedure. Attestation: I, the ordering provider, attest that I have discussed with the patient the benefits, risks, side-effects, alternatives, likelihood of achieving goals, and potential problems during recovery for the procedure that I have provided informed consent. Date  Time: 07/05/2018  8:14 AM  Pre-Procedure Preparation:  Monitoring: As per clinic protocol. Respiration, ETCO2, SpO2, BP, heart rate and rhythm monitor placed and checked for adequate function Safety Precautions: Patient was assessed for positional comfort and pressure points before  starting the procedure. Time-out: I initiated and conducted the "Time-out" before starting the procedure, as per protocol. The patient was asked to participate by confirming the accuracy of the "Time Out" information. Verification of the correct person, site, and procedure were performed and confirmed by me, the nursing staff, and the patient. "Time-out"  conducted as per Joint Commission's Universal Protocol (UP.01.01.01). Time: 0903(knees 0941)  Description of Procedure #1:  Target Area: Knee Joint Approach: Just above the Lateral tibial plateau, lateral to the infrapatellar tendon. Area Prepped: Entire knee area, from the mid-thigh to the mid-shin. Prepping solution: ChloraPrep (2% chlorhexidine gluconate and 70% isopropyl alcohol) Safety Precautions: Aspiration looking for blood return was conducted prior to all injections. At no point did we inject any substances, as a needle was being advanced. No attempts were made at seeking any paresthesias. Safe injection practices and needle disposal techniques used. Medications properly checked for expiration dates. SDV (single dose vial) medications used. Description of the Procedure: Protocol guidelines were followed. The patient was placed in position over the fluoroscopy table. The target area was identified and the area prepped in the usual manner. Skin & deeper tissues infiltrated with local anesthetic. Appropriate amount of time allowed to pass for local anesthetics to take effect. The procedure needles were then advanced to the target area. Proper needle placement secured. Negative aspiration confirmed. Solution injected in intermittent fashion, asking for systemic symptoms every 0.5cc of injectate. The needles were then removed and the area cleansed, making sure to leave some of the prepping solution back to take advantage of its long term bactericidal properties.  Vitals:   07/05/18 1015 07/05/18 1024 07/05/18 1034 07/05/18 1044  BP: (!) 142/78  116/69 (!) 170/98 108/65  Pulse: 80     Resp: 15 (!) 24 19 (!) 23  Temp:  99.1 F (37.3 C)  99.7 F (37.6 C)  TempSrc:  Temporal    SpO2: 100% 100% 100% 100%  Weight:      Height:        Start Time: 1003(knees 0941) hrs. End Time: 1016(stop knee 0952) hrs. Materials:  Needle(s) Type: Regular needle Gauge: 22G Length: 3.5-in Medication(s): Please see orders for medications and dosing details.  Imaging Guidance for procedure #1:  Type of Imaging Technique: Fluoroscopy Guidance (Non-spinal) Indication(s): Morbid obesity. Assistance in needle guidance and placement for procedures requiring needle placement in or near specific anatomical locations impossible to access without such assistance. Exposure Time: Please see nurses notes. Contrast: None used. Fluoroscopic Guidance: I was personally present during the use of fluoroscopy. "Tunnel Vision Technique" used to obtain the best possible view of the target area. Parallax error corrected before commencing the procedure. "Direction-depth-direction" technique used to introduce the needle under continuous pulsed fluoroscopy. Once target was reached, antero-posterior, oblique, and lateral fluoroscopic projection used confirm needle placement in all planes. Images permanently stored in EMR. Ultrasound Guidance: N/A Interpretation: No contrast injected. I personally interpreted the imaging intraoperatively. Adequate needle placement confirmed in multiple planes. Permanent images saved into the patient's record.  Description of Procedure #2:  Target Area: The interlaminar space, initially targeting the lower laminar border of the superior vertebral body. Approach: Paramedial approach. Area Prepped: Entire Posterior Lumbar Region Prepping solution: ChloraPrep (2% chlorhexidine gluconate and 70% isopropyl alcohol) Safety Precautions: Aspiration looking for blood return was conducted prior to all injections. At no point did we inject any substances,  as a needle was being advanced. No attempts were made at seeking any paresthesias. Safe injection practices and needle disposal techniques used. Medications properly checked for expiration dates. SDV (single dose vial) medications used. Description of the Procedure: Protocol guidelines were followed. The procedure needle was introduced through the skin, ipsilateral to the reported pain, and advanced to the target area. Bone was contacted and the needle walked caudad, until the lamina was  cleared. The epidural space was identified using "loss-of-resistance technique" with 2-3 ml of PF-NaCl (0.9% NSS), in a 5cc LOR glass syringe.  Vitals:   07/05/18 1015 07/05/18 1024 07/05/18 1034 07/05/18 1044  BP: (!) 142/78 116/69 (!) 170/98 108/65  Pulse: 80     Resp: 15 (!) 24 19 (!) 23  Temp:  99.1 F (37.3 C)  99.7 F (37.6 C)  TempSrc:  Temporal    SpO2: 100% 100% 100% 100%  Weight:      Height:        Start Time: 1003(knees 0941) hrs. End Time: 1016(stop knee 0952) hrs.  Materials:  Needle(s) Type: Epidural needle Gauge: 17G Length: 20cm Medication(s): Please see orders for medications and dosing details.  Imaging Guidance (Spinal) for procedure #2:  Type of Imaging Technique: Fluoroscopy Guidance (Spinal) Indication(s): Assistance in needle guidance and placement for procedures requiring needle placement in or near specific anatomical locations not easily accessible without such assistance. Exposure Time: Please see nurses notes. Contrast: Before injecting any contrast, we confirmed that the patient did not have an allergy to iodine, shellfish, or radiological contrast. Once satisfactory needle placement was completed at the desired level, radiological contrast was injected. Contrast injected under live fluoroscopy. No contrast complications. See chart for type and volume of contrast used. Fluoroscopic Guidance: I was personally present during the use of fluoroscopy. "Tunnel Vision Technique"  used to obtain the best possible view of the target area. Parallax error corrected before commencing the procedure. "Direction-depth-direction" technique used to introduce the needle under continuous pulsed fluoroscopy. Once target was reached, antero-posterior, oblique, and lateral fluoroscopic projection used confirm needle placement in all planes. Images permanently stored in EMR. Interpretation: I personally interpreted the imaging intraoperatively. Adequate needle placement confirmed in multiple planes. Appropriate spread of contrast into desired area was observed. No evidence of afferent or efferent intravascular uptake. No intrathecal or subarachnoid spread observed. Permanent images saved into the patient's record.  Antibiotic Prophylaxis:   Anti-infectives (From admission, onward)   None     Indication(s): None identified  Post-operative Assessment:  Post-procedure Vital Signs:  Pulse/HCG Rate: 8090 Temp: 99.7 F (37.6 C) Resp: (!) 23 BP: 108/65 SpO2: 100 %  EBL: None  Complications: No immediate post-treatment complications observed by team, or reported by patient.  Note: The patient tolerated the entire procedure well. A repeat set of vitals were taken after the procedure and the patient was kept under observation following institutional policy, for this type of procedure. Post-procedural neurological assessment was performed, showing return to baseline, prior to discharge. The patient was provided with post-procedure discharge instructions, including a section on how to identify potential problems. Should any problems arise concerning this procedure, the patient was given instructions to immediately contact us, at any time, without hesitation. In any case, we plan to contact the patient by telephone for a follow-up status report regarding this interventional procedure.  Comments:  No additional relevant information.  Plan of Care    Imaging Orders     DG C-Arm 1-60 Min-No  Report  Procedure Orders     KNEE INJECTION     KNEE INJECTION     Lumbar Epidural Injection  Medications ordered for procedure: Meds ordered this encounter  Medications  . lidocaine (PF) (XYLOCAINE) 1 % injection 4 mL  . ropivacaine (PF) 2 mg/mL (0.2%) (NAROPIN) injection 4 mL  . Sodium Hyaluronate SOSY 2 mL  . Sodium Hyaluronate SOSY 2 mL  . iopamidol (ISOVUE-M) 41 % intrathecal injection 10 mL  Must be Myelogram-compatible. If not available, you may substitute with a water-soluble, non-ionic, hypoallergenic, myelogram-compatible radiological contrast medium.  Marland Kitchen lidocaine (XYLOCAINE) 2 % (with pres) injection 400 mg  . midazolam (VERSED) 5 MG/5ML injection 1-2 mg    Make sure Flumazenil is available in the pyxis when using this medication. If oversedation occurs, administer 0.2 mg IV over 15 sec. If after 45 sec no response, administer 0.2 mg again over 1 min; may repeat at 1 min intervals; not to exceed 4 doses (1 mg)  . fentaNYL (SUBLIMAZE) injection 25-50 mcg    Make sure Narcan is available in the pyxis when using this medication. In the event of respiratory depression (RR< 8/min): Titrate NARCAN (naloxone) in increments of 0.1 to 0.2 mg IV at 2-3 minute intervals, until desired degree of reversal.  . lactated ringers infusion 1,000 mL  . sodium chloride flush (NS) 0.9 % injection 2 mL  . ropivacaine (PF) 2 mg/mL (0.2%) (NAROPIN) injection 2 mL  . triamcinolone acetonide (KENALOG-40) injection 40 mg   Medications administered: We administered lidocaine (PF), ropivacaine (PF) 2 mg/mL (0.2%), Sodium Hyaluronate, Sodium Hyaluronate, iopamidol, lidocaine, midazolam, fentaNYL, lactated ringers, sodium chloride flush, ropivacaine (PF) 2 mg/mL (0.2%), and triamcinolone acetonide.  See the medical record for exact dosing, route, and time of administration.  Disposition: Discharge home  Discharge Date & Time: 07/05/2018; 1045 hrs.   Physician-requested Follow-up: Return for PPE (2  wks) + Procedure (no sedation): (B) Hyalgan #5.  Future Appointments  Date Time Provider [place]  07/06/2018  2:20 PM Hubbard Hartshorn, FNP [facility]  07/09/2018  8:30 AM Vevelyn Francois, NP [facility] None  07/19/2018 12:30 PM Milinda Pointer, MD [facility] None  05/13/2019  3:00 PM Lawhorn, Lara Mulch, CNM [facility] None   Primary Care Physician: Hubbard Hartshorn, FNP Location: [facility] Note by: Gaspar Cola, MD Date: 07/05/2018; Time: 12:16 PM  Disclaimer:  Medicine is not an Chief Strategy Officer. The only guarantee in medicine is that nothing is guaranteed. It is important to note that the decision to proceed with this intervention was based on the information collected from the patient. The Data and conclusions were drawn from the patient's questionnaire, the interview, and the physical examination. Because the information was provided in large part by the patient, it cannot be guaranteed that it has not been purposely or unconsciously manipulated. Every effort has been made to obtain as much relevant data as possible for this evaluation. It is important to note that the conclusions that lead to this procedure are derived in large part from the available data. Always take into account that the treatment will also be dependent on availability of resources and existing treatment guidelines, considered by other Pain Management Practitioners as being common knowledge and practice, at the time of the intervention. For Medico-Legal purposes, it is also important to point out that variation in procedural techniques and pharmacological choices are the acceptable norm. The indications, contraindications, technique, and results of the above procedure should only be interpreted and judged by a Board-Certified Interventional Pain Specialist with extensive familiarity and expertise in the same exact procedure and technique.

## 2018-07-04 NOTE — Patient Instructions (Addendum)
____________________________________________________________________________________________  Post-Procedure Discharge Instructions  Instructions:  Apply ice: Fill a plastic sandwich bag with crushed ice. Cover it with a small towel and apply to injection site. Apply for 15 minutes then remove x 15 minutes. Repeat sequence on day of procedure, until you go to bed. The purpose is to minimize swelling and discomfort after procedure.  Apply heat: Apply heat to procedure site starting the day following the procedure. The purpose is to treat any soreness and discomfort from the procedure.  Food intake: Start with clear liquids (like water) and advance to regular food, as tolerated.   Physical activities: Keep activities to a minimum for the first 8 hours after the procedure.   Driving: If you have received any sedation, you are not allowed to drive for 24 hours after your procedure.  Blood thinner: Restart your blood thinner 6 hours after your procedure. (Only for those taking blood thinners)  Insulin: As soon as you can eat, you may resume your normal dosing schedule. (Only for those taking insulin)  Infection prevention: Keep procedure site clean and dry.  Post-procedure Pain Diary: Extremely important that this be done correctly and accurately. Recorded information will be used to determine the next step in treatment.  Pain evaluated is that of treated area only. Do not include pain from an untreated area.  Complete every hour, on the hour, for the initial 8 hours. Set an alarm to help you do this part accurately.  Do not go to sleep and have it completed later. It will not be accurate.  Follow-up appointment: Keep your follow-up appointment after the procedure. Usually 2 weeks for most procedures. (6 weeks in the case of radiofrequency.) Bring you pain diary.   Expect:  From numbing medicine (AKA: Local Anesthetics): Numbness or decrease in pain.  Onset: Full effect within 15  minutes of injected.  Duration: It will depend on the type of local anesthetic used. On the average, 1 to 8 hours.   From steroids: Decrease in swelling or inflammation. Once inflammation is improved, relief of the pain will follow.  Onset of benefits: Depends on the amount of swelling present. The more swelling, the longer it will take for the benefits to be seen. In some cases, up to 10 days.  Duration: Steroids will stay in the system x 2 weeks. Duration of benefits will depend on multiple posibilities including persistent irritating factors.  Occasional side-effects: Facial flushing, cramps (if present, drink Gatorade and take over-the-counter Magnesium 450-500 mg once to twice a day).  From procedure: Some discomfort is to be expected once the numbing medicine wears off. This should be minimal if ice and heat are applied as instructed.  Call if:  You experience numbness and weakness that gets worse with time, as opposed to wearing off.  New onset bowel or bladder incontinence. (This applies to Spinal procedures only)  Emergency Numbers:  Durning business hours (Monday - Thursday, 8:00 AM - 4:00 PM) (Friday, 9:00 AM - 12:00 Noon): [phone]  After hours: [phone] ____________________________________________________________________________________________   ____________________________________________________________________________________________  Preparing for your procedure (without sedation)  Instructions: . Oral Intake: Do not eat or drink anything for at least 3 hours prior to your procedure. . Transportation: Unless otherwise stated by your physician, you may drive yourself after the procedure. . Blood Pressure Medicine: Take your blood pressure medicine with a sip of water the morning of the procedure. . Blood thinners: Notify our staff if you are taking any blood thinners. Depending on which   one you take, there will be specific instructions on how and when  to stop it. . Diabetics on insulin: Notify the staff so that you can be scheduled 1st case in the morning. If your diabetes requires high dose insulin, take only  of your normal insulin dose the morning of the procedure and notify the staff that you have done so. . Preventing infections: Shower with an antibacterial soap the morning of your procedure.  . Build-up your immune system: Take 1000 mg of Vitamin C with every meal (3 times a day) the day prior to your procedure. Marland Kitchen Antibiotics: Inform the staff if you have a condition or reason that requires you to take antibiotics before dental procedures. . Pregnancy: If you are pregnant, call and cancel the procedure. . Sickness: If you have a cold, fever, or any active infections, call and cancel the procedure. . Arrival: You must be in the facility at least 30 minutes prior to your scheduled procedure. . Children: Do not bring any children with you. . Dress appropriately: Bring dark clothing that you would not mind if they get stained. . Valuables: Do not bring any jewelry or valuables.  Procedure appointments are reserved for interventional treatments only. Marland Kitchen No Prescription Refills. . No medication changes will be discussed during procedure appointments. . No disability issues will be discussed.  Reasons to call and reschedule or cancel your procedure: (Following these recommendations will minimize the risk of a serious complication.) . Surgeries: Avoid having procedures within 2 weeks of any surgery. (Avoid for 2 weeks before or after any surgery). . Flu Shots: Avoid having procedures within 2 weeks of a flu shots or . (Avoid for 2 weeks before or after immunizations). . Barium: Avoid having a procedure within 7-10 days after having had a radiological study involving the use of radiological contrast. (Myelograms, Barium swallow or enema study). . Heart attacks: Avoid any elective procedures or surgeries for the initial 6 months after a  "Myocardial Infarction" (Heart Attack). . Blood thinners: It is imperative that you stop these medications before procedures. Let us know if you if you take any blood thinner.  . Infection: Avoid procedures during or within two weeks of an infection (including chest colds or gastrointestinal problems). Symptoms associated with infections include: Localized redness, fever, chills, night sweats or profuse sweating, burning sensation when voiding, cough, congestion, stuffiness, runny nose, sore throat, diarrhea, nausea, vomiting, cold or Flu symptoms, recent or current infections. It is specially important if the infection is over the area that we intend to treat. Marland Kitchen Heart and lung problems: Symptoms that may suggest an active cardiopulmonary problem include: cough, chest pain, breathing difficulties or shortness of breath, dizziness, ankle swelling, uncontrolled high or unusually low blood pressure, and/or palpitations. If you are experiencing any of these symptoms, cancel your procedure and contact your primary care physician for an evaluation.  Remember:  Regular Business hours are:  Monday to Thursday 8:00 AM to 4:00 PM  Provider's Schedule: Milinda Pointer, MD:  Procedure days: Tuesday and Thursday 7:30 AM to 4:00 PM  Gillis Santa, MD:  Procedure days: Monday and Wednesday 7:30 AM to 4:00 PM ____________________________________________________________________________________________   ____________________________________________________________________________________________  Weight Management Required  URGENT: Your weight has been found to be adversely affecting your health.  Dear Lauren [name]:  Your current Body mass index is 58.36 kg/m.Marland Kitchen Estimated body mass index is 58.36 kg/m as calculated from the following:   Height as of this encounter: 5\' 4"  (1.626 m).   Weight  as of this encounter: 340 lb (154.2 kg).  Your last four (4) weight and BMI calculations are as follows: Wt  Readings from Last 4 Encounters:  07/05/18 (!) 340 lb (154.2 kg)  06/26/18 (!) 340 lb 11.2 oz (154.5 kg)  06/25/18 (!) 339 lb (153.8 kg)  06/19/18 (!) 340 lb (154.2 kg)   BMI Readings from Last 4 Encounters:  07/05/18 58.36 kg/m  06/26/18 58.48 kg/m  06/25/18 58.19 kg/m  06/19/18 58.36 kg/m    Calculations estimate your ideal body weight to be: Ideal body weight: 54.7 kg (120 lb 9.5 oz) Adjusted ideal body weight: 94.5 kg (208 lb 5.7 oz)  Please use the table below to identify your weight category and associated incidence of chronic pain, secondary to your weight.  BMI interpretation table: BMI level Category Associated incidence of chronic pain  <18 kg/m2 Underweight   18.5-24.9 kg/m2 Ideal body weight   25-29.9 kg/m2 Overweight  20%  30-34.9 kg/m2 Obese (Class I)  68%  35-39.9 kg/m2 Severe obesity (Class II)  136%  >40 kg/m2 Extreme obesity (Class III)  254%   In addition: You will be considered "Morbidly Obese", if your BMI is above 30 and you have one or more of the following conditions that are directly associated with obesity: 1. Type 2 Diabetes (Which in turn can lead to cardiovascular diseases (CVD), stroke, peripheral vascular diseases (PVD), retinopathy, nephropathy, and neuropathy) 2. Cardiovascular Disease  3. Breathing problems (Asthma, obesity-hypoventilation syndrome, obstructive sleep apnea, chronic inflammatory airway disease) 4. Chronic kidney disease 5. Liver disease (nonalcoholic fatty liver disease) 6. High blood pressure 7. Acid reflux (gastroesophageal reflux disease) 8. Osteoarthritis (OA) 9. Low back pain (Lumbar Facet Syndrome) 10. Hip pain (Osteoarthritis of hip) 11. Knee pain (Osteoarthritis of knee) (patients with a BMI>30 kg/m2 were 6.8 times more likely to develop knee OA than normal-weight individuals) 12. Certain types of cancer. (Epidemiological studies have shown that obesity is a risk factor for: post-menopausal breast cancer; cancers of  the endometrium, colon and kidney; malignant adenomas of the oesophagus. Obese subjects have an approximately 1.5-3.5-fold increased risk of developing these cancers compared with normal-weight subjects, and it has been estimated that between 15 and 45% of these cancers can be attributed to overweight. More recent studies suggest that obesity may also increase the risk of other types of cancer, including pancreatic, hepatic and gallbladder cancer. Ref: Obesity and cancer. Pischon T, Nthlings U, [employer] H. Proc Nutr Soc. 2008 May;67(2):128-45. doi: 19.4174/Y8144818563149702.)  Recommendation: At this point it is urgent that you take a step back and concentrate in loosing weight. Because most chronic pain patients do have difficulty exercising secondary to their pain, you must rely on proper nutrition and dieting in order to lose the weight. If your BMI is above 40, you should seriously consider bariatric surgery. A realistic goal is to lose 10% of your body weight over a period of 12 months.  If over time you have unsuccessfully try to lose weight, then it is time for you to seek professional help and to enter a medically supervised weight management program.  Pain management considerations:  1. Pharmacological Problems: Be advised that the use of opioid analgesics has been associated with decreased metabolism and weight gain.  For this reason, should we see that you are unable to lose weight while taking these medications, it may become necessary for [place] to taper down and indefinitely discontinue these medicines.  2. Technical Problems: The incidence of successful interventional therapies decreases as the patient's  BMI increases. It is much more difficult to accomplish a safe and effective interventional therapy on a patient with a BMI above 35. Yours is Body mass index is 58.36 kg/m.Marland Kitchen 3. Radiation Exposure Problems: The x-rays machine, used to accomplish injection therapies, will automatically increase their  x-ray output in order to capture an appropriate bone image. This means that radiation exposure increases exponentially with the patient's BMI. (The higher the BMI, the higher the radiation exposure.) Although the level of radiation used at a given time is still safe to the patient, it is not for the physician and/or assisting staff. Unfortunately, radiation exposure is accumulative. Because physicians and the staff have to do procedures and be exposed on a daily basis, this can result in health problems such as cancer and radiation burns. Radiation exposure to the staff is monitored by the radiation batches that they wear. The exposure levels are reported back to the staff on a quarterly basis. Depending on levels of exposure, physicians and staff may be obligated by law to decrease this exposure. This means that they have the right and obligation to refuse providing therapies where they may be overexposed to radiation. For this reason, physicians may decline to offer therapies such as radiofrequency ablation or implants to patients with a BMI above 40. ____________________________________________________________________________________________

## 2018-07-05 ENCOUNTER — Encounter: Payer: Self-pay | Admitting: Pain Medicine

## 2018-07-05 ENCOUNTER — Ambulatory Visit
Admission: RE | Admit: 2018-07-05 | Discharge: 2018-07-05 | Disposition: A | Discharge status: Home / Self Care | Payer: Medicaid Other | Source: Ambulatory Visit | Attending: Pain Medicine | Admitting: Pain Medicine

## 2018-07-05 ENCOUNTER — Other Ambulatory Visit: Payer: Self-pay

## 2018-07-05 ENCOUNTER — Ambulatory Visit (HOSPITAL_BASED_OUTPATIENT_CLINIC_OR_DEPARTMENT_OTHER): Payer: Medicaid Other | Admitting: Pain Medicine

## 2018-07-05 VITALS — BP 108/65 | HR 80 | Temp 99.7°F | Resp 23 | Ht 64.0 in | Wt 340.0 lb

## 2018-07-05 DIAGNOSIS — Z6841 Body Mass Index (BMI) 40.0 and over, adult: Secondary | ICD-10-CM | POA: Insufficient documentation

## 2018-07-05 DIAGNOSIS — M79605 Pain in left leg: Secondary | ICD-10-CM

## 2018-07-05 DIAGNOSIS — G8929 Other chronic pain: Secondary | ICD-10-CM

## 2018-07-05 DIAGNOSIS — M5136 Other intervertebral disc degeneration, lumbar region: Secondary | ICD-10-CM | POA: Insufficient documentation

## 2018-07-05 DIAGNOSIS — M79604 Pain in right leg: Secondary | ICD-10-CM | POA: Insufficient documentation

## 2018-07-05 DIAGNOSIS — M170 Bilateral primary osteoarthritis of knee: Secondary | ICD-10-CM

## 2018-07-05 DIAGNOSIS — M25562 Pain in left knee: Secondary | ICD-10-CM

## 2018-07-05 DIAGNOSIS — M25561 Pain in right knee: Secondary | ICD-10-CM

## 2018-07-05 MED ORDER — LIDOCAINE HCL (PF) 1 % IJ SOLN
4.0000 mL | Freq: Once | INTRAMUSCULAR | Status: AC
  Administered 2018-07-05: 4 mL

## 2018-07-05 MED ORDER — ROPIVACAINE HCL 2 MG/ML IJ SOLN
2.0000 mL | Freq: Once | INTRAMUSCULAR | Status: AC
  Administered 2018-07-05: 2 mL via EPIDURAL

## 2018-07-05 MED ORDER — SODIUM CHLORIDE 0.9 % IJ SOLN
INTRAMUSCULAR | Status: AC
  Filled 2018-07-05: qty 20

## 2018-07-05 MED ORDER — TRIAMCINOLONE ACETONIDE 40 MG/ML IJ SUSP
INTRAMUSCULAR | Status: AC
  Filled 2018-07-05: qty 1

## 2018-07-05 MED ORDER — LIDOCAINE HCL 2 % IJ SOLN
20.0000 mL | Freq: Once | INTRAMUSCULAR | Status: AC
  Administered 2018-07-05: 400 mg

## 2018-07-05 MED ORDER — MIDAZOLAM HCL 5 MG/5ML IJ SOLN
INTRAMUSCULAR | Status: AC
  Filled 2018-07-05: qty 5

## 2018-07-05 MED ORDER — FENTANYL CITRATE (PF) 100 MCG/2ML IJ SOLN
25.0000 ug | INTRAMUSCULAR | Status: DC | PRN
  Administered 2018-07-05: 100 ug via INTRAVENOUS

## 2018-07-05 MED ORDER — FENTANYL CITRATE (PF) 100 MCG/2ML IJ SOLN
INTRAMUSCULAR | Status: AC
  Filled 2018-07-05: qty 2

## 2018-07-05 MED ORDER — LIDOCAINE HCL (PF) 1 % IJ SOLN
INTRAMUSCULAR | Status: AC
  Filled 2018-07-05: qty 5

## 2018-07-05 MED ORDER — SODIUM HYALURONATE (VISCOSUP) 20 MG/2ML IX SOSY
2.0000 mL | PREFILLED_SYRINGE | Freq: Once | INTRA_ARTICULAR | Status: AC
  Administered 2018-07-05: 2 mL via INTRA_ARTICULAR

## 2018-07-05 MED ORDER — ROPIVACAINE HCL 2 MG/ML IJ SOLN
4.0000 mL | Freq: Once | INTRAMUSCULAR | Status: AC
  Administered 2018-07-05: 4 mL via INTRA_ARTICULAR

## 2018-07-05 MED ORDER — ROPIVACAINE HCL 2 MG/ML IJ SOLN
INTRAMUSCULAR | Status: AC
  Filled 2018-07-05: qty 10

## 2018-07-05 MED ORDER — MIDAZOLAM HCL 5 MG/5ML IJ SOLN
1.0000 mg | INTRAMUSCULAR | Status: DC | PRN
  Administered 2018-07-05: 5 mg via INTRAVENOUS

## 2018-07-05 MED ORDER — TRIAMCINOLONE ACETONIDE 40 MG/ML IJ SUSP
40.0000 mg | Freq: Once | INTRAMUSCULAR | Status: AC
  Administered 2018-07-05: 40 mg

## 2018-07-05 MED ORDER — LACTATED RINGERS IV SOLN
1000.0000 mL | Freq: Once | INTRAVENOUS | Status: AC
  Administered 2018-07-05: 1000 mL via INTRAVENOUS

## 2018-07-05 MED ORDER — IOPAMIDOL (ISOVUE-M 200) INJECTION 41%
INTRAMUSCULAR | Status: AC
  Filled 2018-07-05: qty 10

## 2018-07-05 MED ORDER — LIDOCAINE HCL 2 % IJ SOLN
INTRAMUSCULAR | Status: AC
  Filled 2018-07-05: qty 20

## 2018-07-05 MED ORDER — SODIUM CHLORIDE 0.9% FLUSH
2.0000 mL | Freq: Once | INTRAVENOUS | Status: AC
  Administered 2018-07-05: 2 mL

## 2018-07-05 MED ORDER — IOPAMIDOL (ISOVUE-M 200) INJECTION 41%
10.0000 mL | Freq: Once | INTRAMUSCULAR | Status: AC
  Administered 2018-07-05: 10 mL via EPIDURAL

## 2018-07-05 NOTE — Progress Notes (Signed)
Safety precautions to be maintained throughout the outpatient stay will include: orient to surroundings, keep bed in low position, maintain call bell within reach at all times, provide assistance with transfer out of bed and ambulation.  

## 2018-07-06 ENCOUNTER — Ambulatory Visit: Payer: Medicaid Other | Admitting: Nurse Practitioner

## 2018-07-06 ENCOUNTER — Encounter: Payer: Self-pay | Admitting: Nurse Practitioner

## 2018-07-06 ENCOUNTER — Telehealth: Payer: Self-pay

## 2018-07-06 VITALS — BP 140/100 | HR 109 | Temp 98.9°F | Resp 16 | Ht 64.0 in | Wt 346.4 lb

## 2018-07-06 DIAGNOSIS — R0683 Snoring: Secondary | ICD-10-CM | POA: Diagnosis not present

## 2018-07-06 DIAGNOSIS — F329 Major depressive disorder, single episode, unspecified: Secondary | ICD-10-CM

## 2018-07-06 DIAGNOSIS — F4310 Post-traumatic stress disorder, unspecified: Secondary | ICD-10-CM

## 2018-07-06 DIAGNOSIS — F32A Depression, unspecified: Secondary | ICD-10-CM

## 2018-07-06 DIAGNOSIS — Z23 Encounter for immunization: Secondary | ICD-10-CM | POA: Diagnosis not present

## 2018-07-06 DIAGNOSIS — I10 Essential (primary) hypertension: Secondary | ICD-10-CM

## 2018-07-06 DIAGNOSIS — J45909 Unspecified asthma, uncomplicated: Secondary | ICD-10-CM | POA: Diagnosis not present

## 2018-07-06 DIAGNOSIS — K219 Gastro-esophageal reflux disease without esophagitis: Secondary | ICD-10-CM

## 2018-07-06 DIAGNOSIS — T148XXA Other injury of unspecified body region, initial encounter: Secondary | ICD-10-CM

## 2018-07-06 MED ORDER — ALBUTEROL SULFATE HFA 108 (90 BASE) MCG/ACT IN AERS: 2.0000 | INHALATION_SPRAY | Freq: Four times a day (QID) | RESPIRATORY_TRACT | 0 refills | Status: DC | PRN

## 2018-07-06 MED ORDER — STIOLTO RESPIMAT 2.5-2.5 MCG/ACT IN AERS: 2.0000 | INHALATION_SPRAY | Freq: Every day | RESPIRATORY_TRACT | 0 refills | Status: DC

## 2018-07-06 MED ORDER — TIZANIDINE HCL 4 MG PO TABS: 4.0000 mg | ORAL_TABLET | Freq: Four times a day (QID) | ORAL | 0 refills | Status: DC | PRN

## 2018-07-06 MED ORDER — LOSARTAN POTASSIUM-HCTZ 50-12.5 MG PO TABS: 1.0000 | ORAL_TABLET | Freq: Every day | ORAL | 3 refills | Status: DC

## 2018-07-06 MED ORDER — ESOMEPRAZOLE MAGNESIUM 40 MG PO CPDR: 40.0000 mg | DELAYED_RELEASE_CAPSULE | Freq: Every day | ORAL | 1 refills | Status: DC
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
## 2018-07-06 NOTE — Telephone Encounter (Signed)
Post procedure phone call. Patient states she is doing good.  

## 2018-07-09 ENCOUNTER — Other Ambulatory Visit: Payer: Self-pay

## 2018-07-09 ENCOUNTER — Encounter: Payer: Self-pay | Admitting: Nurse Practitioner

## 2018-07-09 ENCOUNTER — Ambulatory Visit: Payer: Medicaid Other | Attending: Nurse Practitioner | Admitting: Nurse Practitioner

## 2018-07-09 VITALS — BP 137/86 | HR 79 | Temp 99.1°F | Ht 64.0 in | Wt 346.0 lb

## 2018-07-09 DIAGNOSIS — M5136 Other intervertebral disc degeneration, lumbar region: Secondary | ICD-10-CM | POA: Insufficient documentation

## 2018-07-09 DIAGNOSIS — Z7982 Long term (current) use of aspirin: Secondary | ICD-10-CM | POA: Diagnosis not present

## 2018-07-09 DIAGNOSIS — M79604 Pain in right leg: Secondary | ICD-10-CM | POA: Insufficient documentation

## 2018-07-09 DIAGNOSIS — Z86718 Personal history of other venous thrombosis and embolism: Secondary | ICD-10-CM | POA: Diagnosis not present

## 2018-07-09 DIAGNOSIS — K219 Gastro-esophageal reflux disease without esophagitis: Secondary | ICD-10-CM | POA: Diagnosis not present

## 2018-07-09 DIAGNOSIS — F4310 Post-traumatic stress disorder, unspecified: Secondary | ICD-10-CM | POA: Diagnosis not present

## 2018-07-09 DIAGNOSIS — G8929 Other chronic pain: Secondary | ICD-10-CM | POA: Diagnosis not present

## 2018-07-09 DIAGNOSIS — I10 Essential (primary) hypertension: Secondary | ICD-10-CM | POA: Insufficient documentation

## 2018-07-09 DIAGNOSIS — Z6841 Body Mass Index (BMI) 40.0 and over, adult: Secondary | ICD-10-CM | POA: Insufficient documentation

## 2018-07-09 DIAGNOSIS — R7982 Elevated C-reactive protein (CRP): Secondary | ICD-10-CM | POA: Insufficient documentation

## 2018-07-09 DIAGNOSIS — M25512 Pain in left shoulder: Secondary | ICD-10-CM | POA: Diagnosis not present

## 2018-07-09 DIAGNOSIS — M797 Fibromyalgia: Secondary | ICD-10-CM | POA: Diagnosis not present

## 2018-07-09 DIAGNOSIS — M792 Neuralgia and neuritis, unspecified: Secondary | ICD-10-CM | POA: Insufficient documentation

## 2018-07-09 DIAGNOSIS — M25511 Pain in right shoulder: Secondary | ICD-10-CM | POA: Insufficient documentation

## 2018-07-09 DIAGNOSIS — Z79899 Other long term (current) drug therapy: Secondary | ICD-10-CM | POA: Diagnosis not present

## 2018-07-09 DIAGNOSIS — M545 Low back pain: Secondary | ICD-10-CM | POA: Diagnosis present

## 2018-07-09 DIAGNOSIS — F329 Major depressive disorder, single episode, unspecified: Secondary | ICD-10-CM | POA: Insufficient documentation

## 2018-07-09 DIAGNOSIS — M79605 Pain in left leg: Secondary | ICD-10-CM | POA: Diagnosis not present

## 2018-07-09 DIAGNOSIS — R262 Difficulty in walking, not elsewhere classified: Secondary | ICD-10-CM | POA: Insufficient documentation

## 2018-07-09 DIAGNOSIS — Z87891 Personal history of nicotine dependence: Secondary | ICD-10-CM | POA: Diagnosis not present

## 2018-07-09 MED ORDER — GABAPENTIN 300 MG PO CAPS: 300.0000 mg | ORAL_CAPSULE | Freq: Three times a day (TID) | ORAL | 0 refills | Status: DC

## 2018-07-09 MED ORDER — HYDROCODONE-ACETAMINOPHEN 5-325 MG PO TABS: 1.0000 | ORAL_TABLET | Freq: Three times a day (TID) | ORAL | 0 refills | Status: DC | PRN

## 2018-07-09 NOTE — Progress Notes (Signed)
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 cervical epidural steroid injection Possible bilateral cervical facet radiofrequency ablation Diagnostic left intra-articular shoulder injection Diagnostic bilateral suprascapular nerve block Possible bilateral suprascapular nerve radiofrequency ablation Diagnostic bilateral lumbar epidural steroid injection Diagnostic bilateral lumbar facet nerve block Possible bilateral lumbar facet radiofrequency ablation   Palliative PRN treatment(s):   None at this     Provider-requested follow-up: Return in about 4 weeks (around 08/06/2018) for MedMgmt.  Future Appointments  Date Time Provider [place]  07/12/2018  2:00 PM Hubbard Hartshorn, FNP [facility]  07/19/2018  1:00 PM Milinda Pointer, MD [facility] None  08/07/2018  2:30 PM Vevelyn Francois, NP [facility] None  05/13/2019  3:00 PM Lawhorn, Lara Mulch, CNM [facility] None    Primary Care Physician: Hubbard Hartshorn, FNP Location: [facility] Note by: Vevelyn Francois NP Date: 07/09/2018; Time: 3:34 PM  Pain Score Disclaimer: We use the NRS-11 scale. This is a self-reported, subjective measurement of pain severity with only modest accuracy. It is used primarily to identify changes within a particular patient. It must be understood that outpatient pain scales are significantly less accurate that those used for research, where they can be applied under ideal controlled circumstances with minimal exposure to variables. In reality, the score is likely to be a combination of pain intensity and pain affect, where pain affect describes the degree of emotional arousal or changes in action readiness caused by the sensory experience of pain. Factors such as social and work situation, setting, emotional state, anxiety levels, expectation, and prior pain experience may influence pain perception and show large inter-individual differences that may also be affected by time variables.  Patient instructions provided during this appointment: Patient Instructions   ____________________________________________________________________________________________  Medication Rules  Applies to: All patients receiving prescriptions (written or electronic).  Pharmacy of record: Pharmacy  where electronic prescriptions will be sent. If written prescriptions are taken to a different pharmacy, please inform the nursing staff. The pharmacy listed in the electronic medical record should be the one where you would like electronic prescriptions to be sent.  Prescription refills: Only during scheduled appointments. Applies to both, written and electronic prescriptions.  NOTE: The following applies primarily to controlled substances (Opioid* Pain Medications).   Patient's responsibilities: 1. Pain Pills: Bring all pain pills to every appointment (except for procedure  appointments). 2. Pill Bottles: Bring pills in original pharmacy bottle. Always bring newest bottle. Bring bottle, even if empty. 3. Medication refills: You are responsible for knowing and keeping track of what medications you need refilled. The day before your appointment, write a list of all prescriptions that need to be refilled. Bring that list to your appointment and give it to the admitting nurse. Prescriptions will be written only during appointments. If you forget a medication, it will not be "Called in", "Faxed", or "electronically sent". You will need to get another appointment to get these prescribed. 4. Prescription Accuracy: You are responsible for carefully inspecting your prescriptions before leaving our office. Have the discharge nurse carefully go over each prescription with you, before taking them home. Make sure that your name is accurately spelled, that your address is correct. Check the name and dose of your medication to make sure it is accurate. Check the number of pills, and the written instructions to make sure they are clear and accurate. Make sure that you are given enough medication to last until your next medication refill appointment. 5. Taking Medication: Take medication as prescribed. Never take more pills than instructed. Never take medication more frequently than prescribed. Taking less pills or less frequently is permitted and encouraged, when it comes to controlled substances (written prescriptions).  6. Inform other Doctors: Always inform, all of your healthcare providers, of all the medications you take. 7. Pain Medication from other Providers: You are not allowed to accept any additional pain medication from any other Doctor or Healthcare provider. There are two exceptions to this rule. (see below) In the event that you require additional pain medication, you are responsible for notifying us, as stated below. 8. Medication Agreement: You are responsible for carefully  reading and following our Medication Agreement. This must be signed before receiving any prescriptions from our practice. Safely store a copy of your signed Agreement. Violations to the Agreement will result in no further prescriptions. (Additional copies of our Medication Agreement are available upon request.) 9. Laws, Rules, & Regulations: All patients are expected to follow all Federal and [employer], [employer], Rules, [employer]. Ignorance of the Laws does not constitute a valid excuse. The use of any illegal substances is prohibited. 10. Adopted CDC guidelines & recommendations: Target dosing levels will be at or below 60 MME/day. Use of benzodiazepines** is not recommended.  Exceptions: There are only two exceptions to the rule of not receiving pain medications from other Healthcare Providers. 1. Exception #1 (Emergencies): In the event of an emergency (i.e.: accident requiring emergency care), you are allowed to receive additional pain medication. However, you are responsible for: As soon as you are able, call our office [phone], at any time of the day or night, and leave a message stating your name, the date and nature of the emergency, and the name and dose of the medication prescribed. In the event that your call is answered by a member of our staff, make sure to document  and save the date, time, and the name of the person that took your information.  2. Exception #2 (Planned Surgery): In the event that you are scheduled by another doctor or dentist to have any type of surgery or procedure, you are allowed (for a period no longer than 30 days), to receive additional pain medication, for the acute post-op pain. However, in this case, you are responsible for picking up a copy of our "Post-op Pain Management for Surgeons" handout, and giving it to your surgeon or dentist. This document is available at our office, and does not require an appointment to obtain it. Simply go to our office during  business hours (Monday-Thursday from 8:00 AM to 4:00 PM) (Friday 8:00 AM to 12:00 Noon) or if you have a scheduled appointment with [place], prior to your surgery, and ask for it by name. In addition, you will need to provide [place] with your name, name of your surgeon, type of surgery, and date of procedure or surgery.  *Opioid medications include: morphine, codeine, oxycodone, oxymorphone, hydrocodone, hydromorphone, meperidine, tramadol, tapentadol, buprenorphine, fentanyl, methadone. **Benzodiazepine medications include: diazepam (Valium), alprazolam (Xanax), clonazepam (Klonopine), lorazepam (Ativan), clorazepate (Tranxene), chlordiazepoxide (Librium), estazolam (Prosom), oxazepam (Serax), temazepam (Restoril), triazolam (Halcion) (Last updated: 11/09/2017) ____________________________________________________________________________________________   ____________________________________________________________________________________________  Weight Management Required  URGENT: Your weight has been found to be adversely affecting your health.  Dear Ms. Sardinha:  Your current Body mass index is 59.39 kg/m.Marland Kitchen Estimated body mass index is 59.39 kg/m as calculated from the following:   Height as of this encounter: '5\' 4"'$  (1.626 m).   Weight as of this encounter: 346 lb (156.9 kg).  Your last four (4) weight and BMI calculations are as follows: Wt Readings from Last 4 Encounters:  07/09/18 (!) 346 lb (156.9 kg)  07/06/18 (!) 346 lb 6.4 oz (157.1 kg)  07/05/18 (!) 340 lb (154.2 kg)  06/26/18 (!) 340 lb 11.2 oz (154.5 kg)   BMI Readings from Last 4 Encounters:  07/09/18 59.39 kg/m  07/06/18 59.46 kg/m  07/05/18 58.36 kg/m  06/26/18 58.48 kg/m    Calculations estimate your ideal body weight to be: Ideal body weight: 54.7 kg (120 lb 9.5 oz) Adjusted ideal body weight: 95.6 kg (210 lb 12.1 oz)  Please use the table below to identify your weight category and associated incidence of  chronic pain, secondary to your weight.  BMI interpretation table: BMI level Category Associated incidence of chronic pain  <18 kg/m2 Underweight   18.5-24.9 kg/m2 Ideal body weight   25-29.9 kg/m2 Overweight  20%  30-34.9 kg/m2 Obese (Class I)  68%  35-39.9 kg/m2 Severe obesity (Class II)  136%  >40 kg/m2 Extreme obesity (Class III)  254%   In addition: You will be considered "Morbidly Obese", if your BMI is above 30 and you have one or more of the following conditions that are directly associated with obesity: 1. Type 2 Diabetes (Which in turn can lead to cardiovascular diseases (CVD), stroke, peripheral vascular diseases (PVD), retinopathy, nephropathy, and neuropathy) 2. Cardiovascular Disease  3. Breathing problems (Asthma, obesity-hypoventilation syndrome, obstructive sleep apnea, chronic inflammatory airway disease) 4. Chronic kidney disease 5. Liver disease (nonalcoholic fatty liver disease) 6. High blood pressure 7. Acid reflux (gastroesophageal reflux disease) 8. Osteoarthritis (OA) 9. Low back pain (Lumbar Facet Syndrome) 10. Hip pain (Osteoarthritis of hip) 11. Knee pain (Osteoarthritis of knee) (patients with a BMI>30 kg/m2 were 6.8 times more likely to develop knee OA than normal-weight individuals) 12. Certain types of cancer. (Epidemiological  studies have shown that obesity is a risk factor for: post-menopausal breast cancer; cancers of the endometrium, colon and kidney; malignant adenomas of the oesophagus. Obese subjects have an approximately 1.5-3.5-fold increased risk of developing these cancers compared with normal-weight subjects, and it has been estimated that between 15 and 45% of these cancers can be attributed to overweight. More recent studies suggest that obesity may also increase the risk of other types of cancer, including pancreatic, hepatic and gallbladder cancer. Ref: Obesity and cancer. Pischon T, Nthlings U, [employer] H. Proc Nutr Soc. 2008 May;67(2):128-45.  doi: 37.9444/[id number].)  Recommendation: At this point it is urgent that you take a step back and concentrate in loosing weight. Because most chronic pain patients do have difficulty exercising secondary to their pain, you must rely on proper nutrition and dieting in order to lose the weight. If your BMI is above 40, you should seriously consider bariatric surgery. A realistic goal is to lose 10% of your body weight over a period of 12 months.  If over time you have unsuccessfully try to lose weight, then it is time for you to seek professional help and to enter a medically supervised weight management program.  Pain management considerations:  1. Pharmacological Problems: Be advised that the use of opioid analgesics has been associated with decreased metabolism and weight gain.  For this reason, should we see that you are unable to lose weight while taking these medications, it may become necessary for [place] to taper down and indefinitely discontinue these medicines.  2. Technical Problems: The incidence of successful interventional therapies decreases as the patient's BMI increases. It is much more difficult to accomplish a safe and effective interventional therapy on a patient with a BMI above 35. Yours is Body mass index is 59.39 kg/m.Marland Kitchen 3. Radiation Exposure Problems: The x-rays machine, used to accomplish injection therapies, will automatically increase their x-ray output in order to capture an appropriate bone image. This means that radiation exposure increases exponentially with the patient's BMI. (The higher the BMI, the higher the radiation exposure.) Although the level of radiation used at a given time is still safe to the patient, it is not for the physician and/or assisting staff. Unfortunately, radiation exposure is accumulative. Because physicians and the staff have to do procedures and be exposed on a daily basis, this can result in health problems such as cancer and radiation burns.  Radiation exposure to the staff is monitored by the radiation batches that they wear. The exposure levels are reported back to the staff on a quarterly basis. Depending on levels of exposure, physicians and staff may be obligated by law to decrease this exposure. This means that they have the right and obligation to refuse providing therapies where they may be overexposed to radiation. For this reason, physicians may decline to offer therapies such as radiofrequency ablation or implants to patients with a BMI above 40. ____________________________________________________________________________________________

## 2018-07-09 NOTE — Patient Instructions (Addendum)
____________________________________________________________________________________________  Medication Rules  Applies to: All patients receiving prescriptions (written or electronic).  Pharmacy of record: Pharmacy where electronic prescriptions will be sent. If written prescriptions are taken to a different pharmacy, please inform the nursing staff. The pharmacy listed in the electronic medical record should be the one where you would like electronic prescriptions to be sent.  Prescription refills: Only during scheduled appointments. Applies to both, written and electronic prescriptions.  NOTE: The following applies primarily to controlled substances (Opioid* Pain Medications).   Patient's responsibilities: 1. Pain Pills: Bring all pain pills to every appointment (except for procedure appointments). 2. Pill Bottles: Bring pills in original pharmacy bottle. Always bring newest bottle. Bring bottle, even if empty. 3. Medication refills: You are responsible for knowing and keeping track of what medications you need refilled. The day before your appointment, write a list of all prescriptions that need to be refilled. Bring that list to your appointment and give it to the admitting nurse. Prescriptions will be written only during appointments. If you forget a medication, it will not be "Called in", "Faxed", or "electronically sent". You will need to get another appointment to get these prescribed. 4. Prescription Accuracy: You are responsible for carefully inspecting your prescriptions before leaving our office. Have the discharge nurse carefully go over each prescription with you, before taking them home. Make sure that your name is accurately spelled, that your address is correct. Check the name and dose of your medication to make sure it is accurate. Check the number of pills, and the written instructions to make sure they are clear and accurate. Make sure that you are given enough medication to last  until your next medication refill appointment. 5. Taking Medication: Take medication as prescribed. Never take more pills than instructed. Never take medication more frequently than prescribed. Taking less pills or less frequently is permitted and encouraged, when it comes to controlled substances (written prescriptions).  6. Inform other Doctors: Always inform, all of your healthcare providers, of all the medications you take. 7. Pain Medication from other Providers: You are not allowed to accept any additional pain medication from any other Doctor or Healthcare provider. There are two exceptions to this rule. (see below) In the event that you require additional pain medication, you are responsible for notifying us, as stated below. 8. Medication Agreement: You are responsible for carefully reading and following our Medication Agreement. This must be signed before receiving any prescriptions from our practice. Safely store a copy of your signed Agreement. Violations to the Agreement will result in no further prescriptions. (Additional copies of our Medication Agreement are available upon request.) 9. Laws, Rules, & Regulations: All patients are expected to follow all Federal and State Laws, Statutes, Rules, & Regulations. Ignorance of the Laws does not constitute a valid excuse. The use of any illegal substances is prohibited. 10. Adopted CDC guidelines & recommendations: Target dosing levels will be at or below 60 MME/day. Use of benzodiazepines** is not recommended.  Exceptions: There are only two exceptions to the rule of not receiving pain medications from other Healthcare Providers. 1. Exception #1 (Emergencies): In the event of an emergency (i.e.: accident requiring emergency care), you are allowed to receive additional pain medication. However, you are responsible for: As soon as you are able, call our office [phone], at any time of the day or night, and leave a message stating your name, the  date and nature of the emergency, and the name and dose of the medication   prescribed. In the event that your call is answered by a member of our staff, make sure to document and save the date, time, and the name of the person that took your information.  2. Exception #2 (Planned Surgery): In the event that you are scheduled by another doctor or dentist to have any type of surgery or procedure, you are allowed (for a period no longer than 30 days), to receive additional pain medication, for the acute post-op pain. However, in this case, you are responsible for picking up a copy of our "Post-op Pain Management for Surgeons" handout, and giving it to your surgeon or dentist. This document is available at our office, and does not require an appointment to obtain it. Simply go to our office during business hours (Monday-Thursday from 8:00 AM to 4:00 PM) (Friday 8:00 AM to 12:00 Noon) or if you have a scheduled appointment with [place], prior to your surgery, and ask for it by name. In addition, you will need to provide [place] with your name, name of your surgeon, type of surgery, and date of procedure or surgery.  *Opioid medications include: morphine, codeine, oxycodone, oxymorphone, hydrocodone, hydromorphone, meperidine, tramadol, tapentadol, buprenorphine, fentanyl, methadone. **Benzodiazepine medications include: diazepam (Valium), alprazolam (Xanax), clonazepam (Klonopine), lorazepam (Ativan), clorazepate (Tranxene), chlordiazepoxide (Librium), estazolam (Prosom), oxazepam (Serax), temazepam (Restoril), triazolam (Halcion) (Last updated: 11/09/2017) ____________________________________________________________________________________________   ____________________________________________________________________________________________  Weight Management Required  URGENT: Your weight has been found to be adversely affecting your health.  Dear Lauren [name]:  Your current Body mass index is 59.39 kg/m.Marland Kitchen  Estimated body mass index is 59.39 kg/m as calculated from the following:   Height as of this encounter: 5\' 4"  (1.626 m).   Weight as of this encounter: 346 lb (156.9 kg).  Your last four (4) weight and BMI calculations are as follows: Wt Readings from Last 4 Encounters:  07/09/18 (!) 346 lb (156.9 kg)  07/06/18 (!) 346 lb 6.4 oz (157.1 kg)  07/05/18 (!) 340 lb (154.2 kg)  06/26/18 (!) 340 lb 11.2 oz (154.5 kg)   BMI Readings from Last 4 Encounters:  07/09/18 59.39 kg/m  07/06/18 59.46 kg/m  07/05/18 58.36 kg/m  06/26/18 58.48 kg/m    Calculations estimate your ideal body weight to be: Ideal body weight: 54.7 kg (120 lb 9.5 oz) Adjusted ideal body weight: 95.6 kg (210 lb 12.1 oz)  Please use the table below to identify your weight category and associated incidence of chronic pain, secondary to your weight.  BMI interpretation table: BMI level Category Associated incidence of chronic pain  <18 kg/m2 Underweight   18.5-24.9 kg/m2 Ideal body weight   25-29.9 kg/m2 Overweight  20%  30-34.9 kg/m2 Obese (Class I)  68%  35-39.9 kg/m2 Severe obesity (Class II)  136%  >40 kg/m2 Extreme obesity (Class III)  254%   In addition: You will be considered "Morbidly Obese", if your BMI is above 30 and you have one or more of the following conditions that are directly associated with obesity: 1. Type 2 Diabetes (Which in turn can lead to cardiovascular diseases (CVD), stroke, peripheral vascular diseases (PVD), retinopathy, nephropathy, and neuropathy) 2. Cardiovascular Disease  3. Breathing problems (Asthma, obesity-hypoventilation syndrome, obstructive sleep apnea, chronic inflammatory airway disease) 4. Chronic kidney disease 5. Liver disease (nonalcoholic fatty liver disease) 6. High blood pressure 7. Acid reflux (gastroesophageal reflux disease) 8. Osteoarthritis (OA) 9. Low back pain (Lumbar Facet Syndrome) 10. Hip pain (Osteoarthritis of hip) 11. Knee pain (Osteoarthritis of  knee) (patients with a BMI>30  kg/m2 were 6.8 times more likely to develop knee OA than normal-weight individuals) 12. Certain types of cancer. (Epidemiological studies have shown that obesity is a risk factor for: post-menopausal breast cancer; cancers of the endometrium, colon and kidney; malignant adenomas of the oesophagus. Obese subjects have an approximately 1.5-3.5-fold increased risk of developing these cancers compared with normal-weight subjects, and it has been estimated that between 15 and 45% of these cancers can be attributed to overweight. More recent studies suggest that obesity may also increase the risk of other types of cancer, including pancreatic, hepatic and gallbladder cancer. Ref: Obesity and cancer. Pischon T, Nthlings U, [employer] H. Proc Nutr Soc. 2008 May;67(2):128-45. doi: 17.6160/V3710626948546270.)  Recommendation: At this point it is urgent that you take a step back and concentrate in loosing weight. Because most chronic pain patients do have difficulty exercising secondary to their pain, you must rely on proper nutrition and dieting in order to lose the weight. If your BMI is above 40, you should seriously consider bariatric surgery. A realistic goal is to lose 10% of your body weight over a period of 12 months.  If over time you have unsuccessfully try to lose weight, then it is time for you to seek professional help and to enter a medically supervised weight management program.  Pain management considerations:  1. Pharmacological Problems: Be advised that the use of opioid analgesics has been associated with decreased metabolism and weight gain.  For this reason, should we see that you are unable to lose weight while taking these medications, it may become necessary for [place] to taper down and indefinitely discontinue these medicines.  2. Technical Problems: The incidence of successful interventional therapies decreases as the patient's BMI increases. It is much more difficult to  accomplish a safe and effective interventional therapy on a patient with a BMI above 35. Yours is Body mass index is 59.39 kg/m.Marland Kitchen 3. Radiation Exposure Problems: The x-rays machine, used to accomplish injection therapies, will automatically increase their x-ray output in order to capture an appropriate bone image. This means that radiation exposure increases exponentially with the patient's BMI. (The higher the BMI, the higher the radiation exposure.) Although the level of radiation used at a given time is still safe to the patient, it is not for the physician and/or assisting staff. Unfortunately, radiation exposure is accumulative. Because physicians and the staff have to do procedures and be exposed on a daily basis, this can result in health problems such as cancer and radiation burns. Radiation exposure to the staff is monitored by the radiation batches that they wear. The exposure levels are reported back to the staff on a quarterly basis. Depending on levels of exposure, physicians and staff may be obligated by law to decrease this exposure. This means that they have the right and obligation to refuse providing therapies where they may be overexposed to radiation. For this reason, physicians may decline to offer therapies such as radiofrequency ablation or implants to patients with a BMI above 40. ____________________________________________________________________________________________

## 2018-07-10 ENCOUNTER — Telehealth: Payer: Self-pay | Admitting: *Deleted

## 2018-07-10 ENCOUNTER — Ambulatory Visit
Admission: RE | Admit: 2018-07-10 | Discharge: 2018-07-10 | Disposition: A | Discharge status: Home / Self Care | Payer: Medicaid Other | Source: Ambulatory Visit | Attending: Nurse Practitioner | Admitting: Nurse Practitioner

## 2018-07-10 DIAGNOSIS — M25512 Pain in left shoulder: Secondary | ICD-10-CM | POA: Insufficient documentation

## 2018-07-10 DIAGNOSIS — M25511 Pain in right shoulder: Principal | ICD-10-CM

## 2018-07-10 DIAGNOSIS — G8929 Other chronic pain: Secondary | ICD-10-CM

## 2018-07-10 NOTE — Telephone Encounter (Signed)
Sent yesterday 07/09/2018 per Nonnie Done. Patient called and made aware.

## 2018-07-11 ENCOUNTER — Telehealth: Payer: Self-pay | Admitting: Family Medicine

## 2018-07-11 NOTE — Telephone Encounter (Signed)
Hi Lauren [name] - could you please update Lauren [name]'s pulmonology referral to include evaluation for possible reactive airway dz - she is taking stiolto for what she says is "asthma", however we do not have any diagnosis on file and she would benefit from pulmonology consult for this in addition to eval for her OSA. Thanks!

## 2018-07-11 NOTE — Progress Notes (Signed)
Results were reviewed and found to be: normal  No acute injury or pathology identified  Review would suggest interventional pain management techniques may be of benefit

## 2018-07-11 NOTE — Progress Notes (Signed)
  No acute injury or pathology identified  Review would suggest interventional pain management techniques may be of benefit

## 2018-07-12 ENCOUNTER — Telehealth: Payer: Self-pay | Admitting: *Deleted

## 2018-07-12 ENCOUNTER — Encounter: Payer: Self-pay | Admitting: Family Medicine

## 2018-07-12 ENCOUNTER — Ambulatory Visit (INDEPENDENT_AMBULATORY_CARE_PROVIDER_SITE_OTHER): Payer: Medicaid Other | Admitting: Internal Medicine

## 2018-07-12 ENCOUNTER — Encounter: Payer: Self-pay | Admitting: Internal Medicine

## 2018-07-12 ENCOUNTER — Ambulatory Visit (INDEPENDENT_AMBULATORY_CARE_PROVIDER_SITE_OTHER): Payer: Medicaid Other | Admitting: Family Medicine

## 2018-07-12 VITALS — BP 126/86 | HR 104 | Temp 98.1°F | Resp 16 | Ht 64.0 in | Wt 338.9 lb

## 2018-07-12 VITALS — BP 124/82 | HR 106 | Resp 16 | Ht 64.0 in | Wt 342.0 lb

## 2018-07-12 DIAGNOSIS — I10 Essential (primary) hypertension: Secondary | ICD-10-CM | POA: Diagnosis not present

## 2018-07-12 DIAGNOSIS — J683 Other acute and subacute respiratory conditions due to chemicals, gases, fumes and vapors: Secondary | ICD-10-CM | POA: Diagnosis not present

## 2018-07-12 DIAGNOSIS — G4719 Other hypersomnia: Secondary | ICD-10-CM | POA: Diagnosis not present

## 2018-07-12 DIAGNOSIS — R590 Localized enlarged lymph nodes: Secondary | ICD-10-CM | POA: Insufficient documentation

## 2018-07-12 DIAGNOSIS — I471 Supraventricular tachycardia, unspecified: Secondary | ICD-10-CM

## 2018-07-12 DIAGNOSIS — R519 Headache, unspecified: Secondary | ICD-10-CM

## 2018-07-12 DIAGNOSIS — R7303 Prediabetes: Secondary | ICD-10-CM

## 2018-07-12 DIAGNOSIS — R51 Headache: Secondary | ICD-10-CM

## 2018-07-12 DIAGNOSIS — D473 Essential (hemorrhagic) thrombocythemia: Secondary | ICD-10-CM

## 2018-07-12 DIAGNOSIS — Z6841 Body Mass Index (BMI) 40.0 and over, adult: Secondary | ICD-10-CM

## 2018-07-12 DIAGNOSIS — R0683 Snoring: Secondary | ICD-10-CM | POA: Diagnosis not present

## 2018-07-12 DIAGNOSIS — D649 Anemia, unspecified: Secondary | ICD-10-CM

## 2018-07-12 DIAGNOSIS — J45909 Unspecified asthma, uncomplicated: Secondary | ICD-10-CM

## 2018-07-12 DIAGNOSIS — D72829 Elevated white blood cell count, unspecified: Secondary | ICD-10-CM

## 2018-07-12 DIAGNOSIS — M797 Fibromyalgia: Secondary | ICD-10-CM

## 2018-07-12 DIAGNOSIS — D75839 Thrombocytosis, unspecified: Secondary | ICD-10-CM

## 2018-07-12 MED ORDER — AMOXICILLIN-POT CLAVULANATE 875-125 MG PO TABS: 1.0000 | ORAL_TABLET | Freq: Two times a day (BID) | ORAL | 0 refills | Status: AC

## 2018-07-12 MED ORDER — METFORMIN HCL ER 750 MG PO TB24: 750.0000 mg | ORAL_TABLET | Freq: Every day | ORAL | 1 refills | Status: DC

## 2018-07-12 MED ORDER — BECLOMETHASONE DIPROP HFA 80 MCG/ACT IN AERB: 1.0000 | INHALATION_SPRAY | Freq: Two times a day (BID) | RESPIRATORY_TRACT | 2 refills | Status: AC

## 2018-07-12 NOTE — Patient Instructions (Addendum)
Headache Tips:  Keep a headache log: Record the date and time your headaches start and end, the severity of your pain (0-10), any possible triggers, what treatments you tried, and if any of those treatments worked.  Common Headache Triggers: Certain food additives, alcohol, artificial sweeteners (like aspartame), caffeine (overuse and when you stop suddenly), delayed or skipped meals, exercise, certain foods (chocolate, soft cheeses, red wine), bright lights, loud noises, menses, certain odors, oral contraceptives, depression and anxiety, sleep issues, smoke inhalation (smoking or second-hand smoke), stress, weather changes, and NSAIDS such as ibuprofen (advil) and Aleve (naproxen), and BC/Goodies powders.  Supplements known to reduce headaches and migraines are: Riboflavin - Vitamin B2 - goal is 200mg /day twice per day with food Magnesium - goal is 200mg  twice per day with food OR MigreLief - 1 tablet twice per day with food. This is a combination of the Riboflavin and Magnesium. It is only available at some health food stores and from [employer].   Please work to reduce your stress by taking at least 30 minutes to exercise, stretch, and/or meditate every day.  Drink plenty of water throughout the day, do not skip meals, and eat healthy foods that are not fried, fatty, high in sugar, or processed.    Diabetes Mellitus and Nutrition When you have diabetes (diabetes mellitus), it is very important to have healthy eating habits because your blood sugar (glucose) levels are greatly affected by what you eat and drink. Eating healthy foods in the appropriate amounts, at about the same times every day, can help you:  Control your blood glucose.  Lower your risk of heart disease.  Improve your blood pressure.  Reach or maintain a healthy weight.  Every person with diabetes is different, and each person has different needs for a meal plan. Your health care provider may recommend that you work with a  diet and nutrition specialist (dietitian) to make a meal plan that is best for you. Your meal plan may vary depending on factors such as:  The calories you need.  The medicines you take.  Your weight.  Your blood glucose, blood pressure, and cholesterol levels.  Your activity level.  Other health conditions you have, such as heart or kidney disease.  How do carbohydrates affect me? Carbohydrates affect your blood glucose level more than any other type of food. Eating carbohydrates naturally increases the amount of glucose in your blood. Carbohydrate counting is a method for keeping track of how many carbohydrates you eat. Counting carbohydrates is important to keep your blood glucose at a healthy level, especially if you use insulin or take certain oral diabetes medicines. It is important to know how many carbohydrates you can safely have in each meal. This is different for every person. Your dietitian can help you calculate how many carbohydrates you should have at each meal and for snack. Foods that contain carbohydrates include:  Bread, cereal, rice, pasta, and crackers.  Potatoes and corn.  Peas, beans, and lentils.  Milk and yogurt.  Fruit and juice.  Desserts, such as cakes, cookies, ice cream, and candy.  How does alcohol affect me? Alcohol can cause a sudden decrease in blood glucose (hypoglycemia), especially if you use insulin or take certain oral diabetes medicines. Hypoglycemia can be a life-threatening condition. Symptoms of hypoglycemia (sleepiness, dizziness, and confusion) are similar to symptoms of having too much alcohol. If your health care provider says that alcohol is safe for you, follow these guidelines:  Limit alcohol intake to no more  than 1 drink per day for nonpregnant women and 2 drinks per day for men. One drink equals 12 oz of beer, 5 oz of wine, or 1 oz of hard liquor.  Do not drink on an empty stomach.  Keep yourself hydrated with water, diet  soda, or unsweetened iced tea.  Keep in mind that regular soda, juice, and other mixers may contain a lot of sugar and must be counted as carbohydrates.  What are tips for following this plan? Reading food labels  Start by checking the serving size on the label. The amount of calories, carbohydrates, fats, and other nutrients listed on the label are based on one serving of the food. Many foods contain more than one serving per package.  Check the total grams (g) of carbohydrates in one serving. You can calculate the number of servings of carbohydrates in one serving by dividing the total carbohydrates by 15. For example, if a food has 30 g of total carbohydrates, it would be equal to 2 servings of carbohydrates.  Check the number of grams (g) of saturated and trans fats in one serving. Choose foods that have low or no amount of these fats.  Check the number of milligrams (mg) of sodium in one serving. Most people should limit total sodium intake to less than 2,300 mg per day.  Always check the nutrition information of foods labeled as "low-fat" or "nonfat". These foods may be higher in added sugar or refined carbohydrates and should be avoided.  Talk to your dietitian to identify your daily goals for nutrients listed on the label. Shopping  Avoid buying canned, premade, or processed foods. These foods tend to be high in fat, sodium, and added sugar.  Shop around the outside edge of the grocery store. This includes fresh fruits and vegetables, bulk grains, fresh meats, and fresh dairy. Cooking  Use low-heat cooking methods, such as baking, instead of high-heat cooking methods like deep frying.  Cook using healthy oils, such as olive, canola, or sunflower oil.  Avoid cooking with butter, cream, or high-fat meats. Meal planning  Eat meals and snacks regularly, preferably at the same times every day. Avoid going long periods of time without eating.  Eat foods high in fiber, such as  fresh fruits, vegetables, beans, and whole grains. Talk to your dietitian about how many servings of carbohydrates you can eat at each meal.  Eat 4-6 ounces of lean protein each day, such as lean meat, chicken, fish, eggs, or tofu. 1 ounce is equal to 1 ounce of meat, chicken, or fish, 1 egg, or 1/4 cup of tofu.  Eat some foods each day that contain healthy fats, such as avocado, nuts, seeds, and fish. Lifestyle   Check your blood glucose regularly.  Exercise at least 30 minutes 5 or more days each week, or as told by your health care provider.  Take medicines as told by your health care provider.  Do not use any products that contain nicotine or tobacco, such as cigarettes and e-cigarettes. If you need help quitting, ask your health care provider.  Work with a Social worker or diabetes educator to identify strategies to manage stress and any emotional and social challenges. What are some questions to ask my health care provider?  Do I need to meet with a diabetes educator?  Do I need to meet with a dietitian?  What number can I call if I have questions?  When are the best times to check my blood glucose? Where to  find more information:  American Diabetes Association: diabetes.org/food-and-fitness/food  Academy of Nutrition and Dietetics: PokerClues.dk  [employer] of Diabetes and Digestive and Kidney Diseases (NIH): ContactWire.be Summary  A healthy meal plan will help you control your blood glucose and maintain a healthy lifestyle.  Working with a diet and nutrition specialist (dietitian) can help you make a meal plan that is best for you.  Keep in mind that carbohydrates and alcohol have immediate effects on your blood glucose levels. It is important to count carbohydrates and to use alcohol carefully. This information is not intended to replace  advice given to you by your health care provider. Make sure you discuss any questions you have with your health care provider. Document Released: 05/26/2005 Document Revised: 10/03/2016 Document Reviewed: 10/03/2016 Elsevier Interactive Patient Education  2018 [employer] Eating Plan DASH stands for "Dietary Approaches to Stop Hypertension." The DASH eating plan is a healthy eating plan that has been shown to reduce high blood pressure (hypertension). It may also reduce your risk for type 2 diabetes, heart disease, and stroke. The DASH eating plan may also help with weight loss. What are tips for following this plan? General guidelines  Avoid eating more than 2,300 mg (milligrams) of salt (sodium) a day. If you have hypertension, you may need to reduce your sodium intake to 1,500 mg a day.  Limit alcohol intake to no more than 1 drink a day for nonpregnant women and 2 drinks a day for men. One drink equals 12 oz of beer, 5 oz of wine, or 1 oz of hard liquor.  Work with your health care provider to maintain a healthy body weight or to lose weight. Ask what an ideal weight is for you.  Get at least 30 minutes of exercise that causes your heart to beat faster (aerobic exercise) most days of the week. Activities may include walking, swimming, or biking.  Work with your health care provider or diet and nutrition specialist (dietitian) to adjust your eating plan to your individual calorie needs. Reading food labels  Check food labels for the amount of sodium per serving. Choose foods with less than 5 percent of the Daily Value of sodium. Generally, foods with less than 300 mg of sodium per serving fit into this eating plan.  To find whole grains, look for the word "whole" as the first word in the ingredient list. Shopping  Buy products labeled as "low-sodium" or "no salt added."  Buy fresh foods. Avoid canned foods and premade or frozen meals. Cooking  Avoid adding salt when  cooking. Use salt-free seasonings or herbs instead of table salt or sea salt. Check with your health care provider or pharmacist before using salt substitutes.  Do not fry foods. Cook foods using healthy methods such as baking, boiling, grilling, and broiling instead.  Cook with heart-healthy oils, such as olive, canola, soybean, or sunflower oil. Meal planning   Eat a balanced diet that includes: ? 5 or more servings of fruits and vegetables each day. At each meal, try to fill half of your plate with fruits and vegetables. ? Up to 6-8 servings of whole grains each day. ? Less than 6 oz of lean meat, poultry, or fish each day. A 3-oz serving of meat is about the same size as a deck of cards. One egg equals 1 oz. ? 2 servings of low-fat dairy each day. ? A serving of nuts, seeds, or beans 5 times each week. ? Heart-healthy fats. Healthy  fats called Omega-3 fatty acids are found in foods such as flaxseeds and coldwater fish, like sardines, salmon, and mackerel.  Limit how much you eat of the following: ? Canned or prepackaged foods. ? Food that is high in trans fat, such as fried foods. ? Food that is high in saturated fat, such as fatty meat. ? Sweets, desserts, sugary drinks, and other foods with added sugar. ? Full-fat dairy products.  Do not salt foods before eating.  Try to eat at least 2 vegetarian meals each week.  Eat more home-cooked food and less restaurant, buffet, and fast food.  When eating at a restaurant, ask that your food be prepared with less salt or no salt, if possible. What foods are recommended? The items listed may not be a complete list. Talk with your dietitian about what dietary choices are best for you. Grains Whole-grain or whole-wheat bread. Whole-grain or whole-wheat pasta. Brown rice. [place]. Bulgur. Whole-grain and low-sodium cereals. Pita bread. Low-fat, low-sodium crackers. Whole-wheat flour tortillas. Vegetables Fresh or frozen vegetables  (raw, steamed, roasted, or grilled). Low-sodium or reduced-sodium tomato and vegetable juice. Low-sodium or reduced-sodium tomato sauce and tomato paste. Low-sodium or reduced-sodium canned vegetables. Fruits All fresh, dried, or frozen fruit. Canned fruit in natural juice (without added sugar). Meat and other protein foods Skinless chicken or [place]. Ground chicken or [place]. Pork with fat trimmed off. Fish and seafood. Egg whites. Dried beans, peas, or lentils. Unsalted nuts, nut butters, and seeds. Unsalted canned beans. Lean cuts of beef with fat trimmed off. Low-sodium, lean deli meat. Dairy Low-fat (1%) or fat-free (skim) milk. Fat-free, low-fat, or reduced-fat cheeses. Nonfat, low-sodium ricotta or cottage cheese. Low-fat or nonfat yogurt. Low-fat, low-sodium cheese. Fats and oils Soft margarine without trans fats. Vegetable oil. Low-fat, reduced-fat, or light mayonnaise and salad dressings (reduced-sodium). Canola, safflower, olive, soybean, and sunflower oils. Avocado. Seasoning and other foods Herbs. Spices. Seasoning mixes without salt. Unsalted popcorn and pretzels. Fat-free sweets. What foods are not recommended? The items listed may not be a complete list. Talk with your dietitian about what dietary choices are best for you. Grains Baked goods made with fat, such as croissants, muffins, or some breads. Dry pasta or rice meal packs. Vegetables Creamed or fried vegetables. Vegetables in a cheese sauce. Regular canned vegetables (not low-sodium or reduced-sodium). Regular canned tomato sauce and paste (not low-sodium or reduced-sodium). Regular tomato and vegetable juice (not low-sodium or reduced-sodium). Angie Fava. Olives. Fruits Canned fruit in a light or heavy syrup. Fried fruit. Fruit in cream or butter sauce. Meat and other protein foods Fatty cuts of meat. Ribs. Fried meat. Berniece Salines. Sausage. Bologna and other processed lunch meats. Salami. Fatback. Hotdogs. Bratwurst. Salted nuts  and seeds. Canned beans with added salt. Canned or smoked fish. Whole eggs or egg yolks. Chicken or [place] with skin. Dairy Whole or 2% milk, cream, and half-and-half. Whole or full-fat cream cheese. Whole-fat or sweetened yogurt. Full-fat cheese. Nondairy creamers. Whipped toppings. Processed cheese and cheese spreads. Fats and oils Butter. Stick margarine. Lard. Shortening. Ghee. Bacon fat. Tropical oils, such as coconut, palm kernel, or palm oil. Seasoning and other foods Salted popcorn and pretzels. Onion salt, garlic salt, seasoned salt, table salt, and sea salt. Worcestershire sauce. Tartar sauce. Barbecue sauce. Teriyaki sauce. Soy sauce, including reduced-sodium. Steak sauce. Canned and packaged gravies. Fish sauce. Oyster sauce. Cocktail sauce. Horseradish that you find on the shelf. Ketchup. Mustard. Meat flavorings and tenderizers. Bouillon cubes. Hot sauce and Tabasco sauce. Premade or packaged  marinades. Premade or packaged taco seasonings. Relishes. Regular salad dressings. Where to find more information:  National Heart, Lung, and [place]: https://wilson-eaton.com/  American Heart Association: www.heart.org Summary  The DASH eating plan is a healthy eating plan that has been shown to reduce high blood pressure (hypertension). It may also reduce your risk for type 2 diabetes, heart disease, and stroke.  With the DASH eating plan, you should limit salt (sodium) intake to 2,300 mg a day. If you have hypertension, you may need to reduce your sodium intake to 1,500 mg a day.  When on the DASH eating plan, aim to eat more fresh fruits and vegetables, whole grains, lean proteins, low-fat dairy, and heart-healthy fats.  Work with your health care provider or diet and nutrition specialist (dietitian) to adjust your eating plan to your individual calorie needs. This information is not intended to replace advice given to you by your health care provider. Make sure you discuss any questions  you have with your health care provider. Document Released: 08/18/2011 Document Revised: 08/22/2016 Document Reviewed: 08/22/2016 Elsevier Interactive Patient Education  [employer].
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
## 2018-07-12 NOTE — Progress Notes (Signed)
Name: Lauren [name] MRN: [id number] DOB: 12-May-1976     CONSULTATION DATE: 10.31.19 REFERRING MD : poulose  CHIEF COMPLAINT: excessive daytime sleepiness  STUDIES:    10.14.19  CXR independently reviewed by Me today No edema No effusions No pneumonia    HISTORY OF PRESENT ILLNESS: 42 year old pleasant African-American female morbidly obese seen today for excessive daytime sleepiness fatigue and tiredness  Patient  has been having sleep problems for may years Patient has been having excessive daytime sleepiness Patient has been having extreme fatigue and tiredness, lack of energy +  very Loud snoring every night + struggling breathe at night and gasps for air  Patient is morbidly obese she weighs 342 pounds She has gained over 100 pounds in the last 18 months Patient has 9 children  Patient states that she has had a sleep study in the past several years ago but it was not a good reading Patient is also on oxygen therapy that was prescribed several years ago  Patient currently has intermittent wheezing and cough associated with her obesity She has used Qvar in the past and has helped but is currently on Stiolto Respimat which does not seem to help  At this time there is no signs of infection at this time No signs of heart failure  Patient with a diagnosis of SVT and sees cardiology Patient had a previous cardiac ablation in May 2019 however her SVT has returned Patient currently on calcium channel blocker and is being treated by Dr. Clayborn Bigness Currently has a Holter monitor for assessment  Discussed sleep data and reviewed with patient.  Encouraged proper weight management.  Discussed driving precautions and its relationship with hypersomnolence.  Discussed operating dangerous equipment and its relationship with hypersomnolence.  Discussed sleep hygiene, and benefits of a fixed sleep waked time.  The importance of getting eight or more hours of sleep discussed with  patient.  Discussed limiting the use of the computer and television before bedtime.  Decrease naps during the day, so night time sleep will become enhanced.  Limit caffeine, and sleep deprivation.  HTN, stroke, and heart failure are potential risk factors.   PAST MEDICAL HISTORY :   has a past medical history of Advanced maternal age in multigravida, Asthma, Depression, GERD (gastroesophageal reflux disease), [place] multiparity, Headache, Hypertension, Leg fracture, Obesity, morbid, BMI 40.0-49.9 ([place]), Post traumatic stress disorder, Pre-eclampsia, SVT (supraventricular tachycardia) ([place]), and Wrist fracture, closed.  has a past surgical history that includes Rotator cuff repair; Knee surgery; Cholecystectomy; Cesarean section (2010); Cesarean section (2011); Tonsillectomy; and Cardiac electrophysiology study and ablation (01/18/2018). Prior to Admission medications   Medication Sig Start Date End Date Taking? Authorizing Provider  acetaminophen (TYLENOL) 325 MG tablet Take 650 mg by mouth every 6 (six) hours as needed.    [provider]  albuterol (PROVENTIL HFA;VENTOLIN HFA) 108 (90 Base) MCG/ACT inhaler Inhale 2 puffs into the lungs every 6 (six) hours as needed for wheezing or shortness of breath. 07/06/18   Poulose, Bethel Born, NP  amoxicillin-clavulanate (AUGMENTIN) 875-125 MG tablet Take 1 tablet by mouth 2 (two) times daily for 10 days. 07/12/18 07/22/18  Hubbard Hartshorn, FNP  aspirin EC 81 MG tablet Take 81 mg by mouth daily.    [provider]  diltiazem (CARDIZEM CD) 180 MG 24 hr capsule Take 1 capsule by mouth daily.    [provider]  esomeprazole (NEXIUM) 40 MG capsule Take 1 capsule (40 mg total) by mouth daily at 12 noon. 07/06/18  Poulose, Bethel Born, NP  furosemide (LASIX) 40 MG tablet Take 40 mg by mouth daily. 04/09/18   [provider]  gabapentin (NEURONTIN) 300 MG capsule Take 1 capsule (300 mg total) by mouth 3 (three) times daily.  07/09/18 08/08/18  Vevelyn Francois, NP  HYDROcodone-acetaminophen (NORCO) 5-325 MG tablet Take 1 tablet by mouth every 6 (six) hours as needed for up to 7 doses for severe pain. Patient not taking: Reported on 07/12/2018 06/26/18   Darel Hong, MD  HYDROcodone-acetaminophen (NORCO) 5-325 MG tablet Take 1 tablet by mouth every 8 (eight) hours as needed for moderate pain. Patient not taking: Reported on 07/12/2018 07/09/18 08/08/18  Vevelyn Francois, NP  ibuprofen (ADVIL,MOTRIN) 600 MG tablet Take 1 tablet (600 mg total) by mouth every 8 (eight) hours as needed. 06/26/18   Darel Hong, MD  losartan-hydrochlorothiazide (HYZAAR) 50-12.5 MG tablet Take 1 tablet by mouth daily. 07/06/18   Fredderick Severance, NP  metFORMIN (GLUCOPHAGE XR) 750 MG 24 hr tablet Take 1 tablet (750 mg total) by mouth daily with breakfast. 07/12/18   Hubbard Hartshorn, FNP  metoprolol succinate (TOPROL-XL) 100 MG 24 hr tablet Take 100 mg by mouth daily. Take with or immediately following a meal.    [provider]  STIOLTO RESPIMAT 2.5-2.5 MCG/ACT AERS Inhale 2 puffs into the lungs daily. 07/06/18   Poulose, Bethel Born, NP  tiZANidine (ZANAFLEX) 4 MG tablet Take 1 tablet (4 mg total) by mouth every 6 (six) hours as needed for muscle spasms. Patient not taking: Reported on 07/12/2018 07/06/18   Fredderick Severance, NP   Allergies  Allergen Reactions  . Nucynta [Tapentadol] Hives    FAMILY HISTORY:  family history includes Diabetes in her father, mother, sister, sister, sister, sister, sister, and sister; Migraines in her father, maternal aunt, maternal uncle, paternal aunt, and paternal uncle; Ovarian cancer in her cousin. SOCIAL HISTORY:  reports that she has quit smoking. Her smoking use included cigarettes. She has never used smokeless tobacco. She reports that she does not drink alcohol or use drugs.  REVIEW OF SYSTEMS:   Constitutional: Negative for fever, chills, weight loss, malaise/fatigue and  diaphoresis.  HENT: Negative for hearing loss, ear pain, nosebleeds, congestion, sore throat, neck pain, tinnitus and ear discharge.   Eyes: Negative for blurred vision, double vision, photophobia, pain, discharge and redness.  Respiratory: Negative for cough, hemoptysis, sputum production, +shortness of breath, +wheezing  Cardiovascular: Negative for chest pain, palpitations, orthopnea, claudication, leg swelling and PND.  Gastrointestinal: Negative for heartburn, nausea, vomiting, abdominal pain, diarrhea, constipation, blood in stool and melena.  Genitourinary: Negative for dysuria, urgency, frequency, hematuria and flank pain.  Musculoskeletal: Negative for myalgias, back pain, joint pain and falls.  Skin: Negative for itching and rash.  Neurological: Negative for dizziness, tingling, tremors, sensory change, speech change, focal weakness, seizures, loss of consciousness, weakness and headaches.  Endo/Heme/Allergies: Negative for environmental allergies and polydipsia. Does not bruise/bleed easily.  ALL OTHER ROS ARE NEGATIVE   BP 124/82 (BP Location: Right Arm, Cuff Size: Large)   Pulse (!) 106   Resp 16   Ht 5\' 4"  (1.626 m)   Wt (!) 342 lb (155.1 kg)   SpO2 94%   BMI 58.70 kg/m    Physical Examination:   GENERAL:NAD, no fevers, chills, no weakness no fatigue HEAD: Normocephalic, atraumatic.  EYES: Pupils equal, round, reactive to light. Extraocular muscles intact. No scleral icterus.  MOUTH: Moist mucosal membrane.   EAR, NOSE, THROAT: Clear without  exudates. No external lesions.  NECK: Supple. No thyromegaly. No nodules. No JVD.  PULMONARY:CTA B/L no wheezes, no crackles, no rhonchi CARDIOVASCULAR: S1 and S2. Regular rate and rhythm. No murmurs, rubs, or gallops. No edema.  GASTROINTESTINAL: Soft, nontender, nondistended. No masses. Positive bowel sounds.  MUSCULOSKELETAL: No swelling, clubbing, or edema. Range of motion full in all extremities.  NEUROLOGIC: Cranial nerves  II through XII are intact. No gross focal neurological deficits.  SKIN: No ulceration, lesions, rashes, or cyanosis. Skin warm and dry. Turgor intact.  PSYCHIATRIC: Mood, affect within normal limits. The patient is awake, alert and oriented x 3. Insight, judgment intact.      ASSESSMENT / PLAN: 42 year old morbidly obese African-American female with signs and symptoms of sleep apnea with snoring excessive daytime sleepiness gasping for air at night in the setting of chronic hypoxic respiratory failure most likely related to her morbid obesity in the setting of deconditioned state with a history of cardiac disease with SVT and underlying reactive airways disease from her obesity  #1 signs and symptoms of sleep apnea At this time I would highly recommend obtaining a sleep study as soon as possible to assess her sleep apnea  #2 chronic hypoxic respiratory failure Continue oxygen as prescribed  #3 reactive airways disease from obesity and pro-inflammatory state Start Qvar therapy which is an inhaled steroid which has helped in the past Albuterol as needed  #4 obesity -recommend significant weight loss -recommend changing diet  #5Deconditioned state -Recommend increased daily activity and exercise  #6 SVT Patient currently has a Holter monitor and will follow-up with cardiology  Patient  satisfied with Plan of action and management. All questions answered Follow-up after test completed   Corrin Parker, M.D.  [facility]  Medical Director [place] Director [facility] Cardio-Pulmonary Department

## 2018-07-12 NOTE — Patient Instructions (Signed)
Start Qvar therapy for wheezing  Obtain SLeep Study ASAP  Recommend Weight loss

## 2018-07-12 NOTE — Telephone Encounter (Signed)
Patient notified PA request resubmitted.

## 2018-07-13 ENCOUNTER — Other Ambulatory Visit: Payer: Self-pay | Admitting: Family Medicine

## 2018-07-13 DIAGNOSIS — R590 Localized enlarged lymph nodes: Secondary | ICD-10-CM

## 2018-07-13 DIAGNOSIS — D473 Essential (hemorrhagic) thrombocythemia: Secondary | ICD-10-CM | POA: Insufficient documentation

## 2018-07-13 DIAGNOSIS — D72829 Elevated white blood cell count, unspecified: Secondary | ICD-10-CM | POA: Insufficient documentation

## 2018-07-13 DIAGNOSIS — R7303 Prediabetes: Secondary | ICD-10-CM | POA: Insufficient documentation

## 2018-07-13 DIAGNOSIS — D649 Anemia, unspecified: Secondary | ICD-10-CM | POA: Insufficient documentation

## 2018-07-13 DIAGNOSIS — D75839 Thrombocytosis, unspecified: Secondary | ICD-10-CM | POA: Insufficient documentation

## 2018-07-13 LAB — CBC WITH DIFFERENTIAL/PLATELET
BASOS PCT: 0.3 %
Basophils Absolute: 53 cells/uL (ref 0–200)
EOS ABS: 175 {cells}/uL (ref 15–500)
Eosinophils Relative: 1 %
HEMATOCRIT: 37.2 % (ref 35.0–45.0)
HEMOGLOBIN: 11.2 g/dL — AB (ref 11.7–15.5)
LYMPHS ABS: 4918 {cells}/uL — AB (ref 850–3900)
MCH: 23.4 pg — AB (ref 27.0–33.0)
MCHC: 30.1 g/dL — ABNORMAL LOW (ref 32.0–36.0)
MCV: 77.7 fL — ABNORMAL LOW (ref 80.0–100.0)
MPV: 11 fL (ref 7.5–12.5)
Monocytes Relative: 5.7 %
NEUTROS ABS: 11358 {cells}/uL — AB (ref 1500–7800)
Neutrophils Relative %: 64.9 %
Platelets: 611 10*3/uL — ABNORMAL HIGH (ref 140–400)
RBC: 4.79 10*6/uL (ref 3.80–5.10)
RDW: 15.1 % — ABNORMAL HIGH (ref 11.0–15.0)
Total Lymphocyte: 28.1 %
WBC mixed population: 998 cells/uL — ABNORMAL HIGH (ref 200–950)
WBC: 17.5 10*3/uL — ABNORMAL HIGH (ref 3.8–10.8)

## 2018-07-13 LAB — COMPLETE METABOLIC PANEL WITH GFR
AG Ratio: 1 (calc) (ref 1.0–2.5)
ALT: 25 U/L (ref 6–29)
AST: 21 U/L (ref 10–30)
Albumin: 3.9 g/dL (ref 3.6–5.1)
Alkaline phosphatase (APISO): 77 U/L (ref 33–115)
BUN: 16 mg/dL (ref 7–25)
CALCIUM: 9.8 mg/dL (ref 8.6–10.2)
CO2: 26 mmol/L (ref 20–32)
CREATININE: 0.77 mg/dL (ref 0.50–1.10)
Chloride: 105 mmol/L (ref 98–110)
GFR, Est African American: 112 mL/min/{1.73_m2} (ref >=60)
GFR, Est Non African American: 97 mL/min/{1.73_m2} (ref >=60)
GLUCOSE: 85 mg/dL (ref 65–139)
Globulin: 3.9 g/dL (calc) — ABNORMAL HIGH (ref 1.9–3.7)
Potassium: 4.4 mmol/L (ref 3.5–5.3)
Sodium: 141 mmol/L (ref 135–146)
Total Bilirubin: 0.3 mg/dL (ref 0.2–1.2)
Total Protein: 7.8 g/dL (ref 6.1–8.1)

## 2018-07-13 LAB — IRON,TIBC AND FERRITIN PANEL
%SAT: 5 % — AB (ref 16–45)
Ferritin: 18 ng/mL (ref 16–154)
Iron: 20 ug/dL — ABNORMAL LOW (ref 40–190)
TIBC: 429 mcg/dL (calc) (ref 250–450)

## 2018-07-17 ENCOUNTER — Other Ambulatory Visit: Payer: Self-pay

## 2018-07-17 ENCOUNTER — Encounter: Payer: Self-pay | Admitting: Oncology

## 2018-07-17 ENCOUNTER — Inpatient Hospital Stay: Payer: Medicaid Other | Attending: Oncology | Admitting: Oncology

## 2018-07-17 ENCOUNTER — Inpatient Hospital Stay: Payer: Medicaid Other

## 2018-07-17 VITALS — BP 160/85 | HR 108 | Temp 96.7°F | Resp 18 | Ht 65.75 in | Wt 342.8 lb

## 2018-07-17 DIAGNOSIS — Z7982 Long term (current) use of aspirin: Secondary | ICD-10-CM | POA: Diagnosis not present

## 2018-07-17 DIAGNOSIS — M25519 Pain in unspecified shoulder: Secondary | ICD-10-CM | POA: Insufficient documentation

## 2018-07-17 DIAGNOSIS — D72829 Elevated white blood cell count, unspecified: Secondary | ICD-10-CM

## 2018-07-17 DIAGNOSIS — I10 Essential (primary) hypertension: Secondary | ICD-10-CM | POA: Diagnosis not present

## 2018-07-17 DIAGNOSIS — D509 Iron deficiency anemia, unspecified: Secondary | ICD-10-CM | POA: Diagnosis present

## 2018-07-17 DIAGNOSIS — Z79899 Other long term (current) drug therapy: Secondary | ICD-10-CM | POA: Insufficient documentation

## 2018-07-17 NOTE — Progress Notes (Signed)
Here for new pt evaluation.  

## 2018-07-18 ENCOUNTER — Other Ambulatory Visit: Payer: Medicaid Other

## 2018-07-18 DIAGNOSIS — D509 Iron deficiency anemia, unspecified: Secondary | ICD-10-CM | POA: Insufficient documentation

## 2018-07-18 NOTE — Progress Notes (Signed)
[place]  [phone] [phone]  ID: Lauren [name] OB: 1975/09/20  MR#: [id number]  MBT#:[id number]  Patient Care Team: Hubbard Hartshorn, FNP as PCP - General (Family Medicine) Yolonda Kida, MD as Consulting Physician (Cardiology)  CHIEF COMPLAINT: Iron deficiency anemia, leukocytosis, thrombocytosis.  INTERVAL HISTORY: Patient is a 42 year old female who was noted to have iron deficiency anemia as well as elevated white blood cell count and platelets on routine blood work.  She currently feels well and is asymptomatic.  She has no neurologic complaints.  She does not complain of weakness or fatigue.  She has good appetite and denies weight loss.  She has no chest pain or shortness of breath.  She denies any nausea, vomiting, constipation, or diarrhea.  She has no urinary complaints.  Patient feels at her baseline offers no specific complaints today.  REVIEW OF SYSTEMS:   Review of Systems  Constitutional: Negative.  Negative for fever, malaise/fatigue and weight loss.  Respiratory: Negative.  Negative for cough and shortness of breath.   Cardiovascular: Negative.  Negative for chest pain and leg swelling.  Gastrointestinal: Negative.  Negative for abdominal pain, blood in stool and melena.  Genitourinary: Negative.  Negative for dysuria and hematuria.  Musculoskeletal: Positive for joint pain. Negative for back pain.  Skin: Negative.  Negative for rash.  Neurological: Negative.  Negative for focal weakness, weakness and headaches.  Psychiatric/Behavioral: Negative.  The patient is not nervous/anxious.     As per HPI. Otherwise, a complete review of systems is negative.  PAST MEDICAL HISTORY: Past Medical History:  Diagnosis Date  . Advanced maternal age in multigravida   . Asthma   . Depression   . GERD (gastroesophageal reflux disease)   . [place] multiparity   . Headache   . History of carpal tunnel surgery of left wrist 05/2018    . Hypertension   . Leg fracture   . Obesity, morbid, BMI 40.0-49.9 ([place])   . Post traumatic stress disorder   . Pre-eclampsia   . SVT (supraventricular tachycardia) ([place])    a. prior report of SVT req adenosine;  b. 2011 Holter @ [facility] reportedly showed "irregular heartbeat"; c. 05/2016 Holter: "basically unremarkable" per Dr. Clayborn Bigness;  d. 07/2016 Echo: EF 50-55%. Nl RV fxn.  . Wrist fracture, closed    bilateral    PAST SURGICAL HISTORY: Past Surgical History:  Procedure Laterality Date  . CARDIAC ELECTROPHYSIOLOGY STUDY AND ABLATION  01/18/2018  . CESAREAN SECTION  2010  . CESAREAN SECTION  2011  . CHOLECYSTECTOMY    . KNEE SURGERY    . ROTATOR CUFF REPAIR    . TONSILLECTOMY      FAMILY HISTORY: Family History  Problem Relation Age of Onset  . Diabetes Sister   . Migraines Maternal Aunt   . Throat cancer Maternal Aunt   . Migraines Maternal Uncle   . Migraines Paternal Aunt   . Migraines Paternal Uncle   . Diabetes Sister   . Stomach cancer Sister   . Diabetes Sister   . Diabetes Sister   . Diabetes Sister   . Diabetes Sister   . Ovarian cancer Cousin   . Diabetes Mother   . Diabetes Father   . Migraines Father   . Hypertension Brother   . Coronary artery disease Brother   . Hypertension Brother   . Diabetes Brother   . Breast cancer Neg Hx     ADVANCED DIRECTIVES (Y/N):  N  HEALTH MAINTENANCE: Social  History   Tobacco Use  . Smoking status: Never Smoker  . Smokeless tobacco: Never Used  . Tobacco comment: occasional smoker  Substance Use Topics  . Alcohol use: No    Comment: occ.  . Drug use: No    Comment: last used several months ago     Colonoscopy:  PAP:  Bone density:  Lipid panel:  Allergies  Allergen Reactions  . Nucynta [Tapentadol] Hives    Current Outpatient Medications  Medication Sig Dispense Refill  . albuterol (PROVENTIL HFA;VENTOLIN HFA) 108 (90 Base) MCG/ACT inhaler Inhale 2 puffs into the lungs every 6 (six) hours as  needed for wheezing or shortness of breath. 1 Inhaler 0  . amoxicillin-clavulanate (AUGMENTIN) 875-125 MG tablet Take 1 tablet by mouth 2 (two) times daily for 10 days. 20 tablet 0  . aspirin EC 81 MG tablet Take 81 mg by mouth daily.    . beclomethasone (QVAR REDIHALER) 80 MCG/ACT inhaler Inhale 1 puff into the lungs 2 (two) times daily. 1 Inhaler 2  . diltiazem (CARDIZEM CD) 180 MG 24 hr capsule Take 1 capsule by mouth daily.    Marland Kitchen esomeprazole (NEXIUM) 40 MG capsule Take 1 capsule (40 mg total) by mouth daily at 12 noon. 90 capsule 1  . furosemide (LASIX) 40 MG tablet Take 40 mg by mouth daily.  5  . gabapentin (NEURONTIN) 300 MG capsule Take 1 capsule (300 mg total) by mouth 3 (three) times daily. 90 capsule 0  . HYDROcodone-acetaminophen (NORCO) 5-325 MG tablet Take 1 tablet by mouth every 8 (eight) hours as needed for moderate pain. 90 tablet 0  . ibuprofen (ADVIL,MOTRIN) 600 MG tablet Take 1 tablet (600 mg total) by mouth every 8 (eight) hours as needed. 30 tablet 0  . losartan-hydrochlorothiazide (HYZAAR) 50-12.5 MG tablet Take 1 tablet by mouth daily. 90 tablet 3  . metoprolol succinate (TOPROL-XL) 100 MG 24 hr tablet Take 100 mg by mouth daily. Take with or immediately following a meal.    . STIOLTO RESPIMAT 2.5-2.5 MCG/ACT AERS Inhale 2 puffs into the lungs daily. 1 Inhaler 0  . acetaminophen (TYLENOL) 325 MG tablet Take 650 mg by mouth every 6 (six) hours as needed.    . metFORMIN (GLUCOPHAGE XR) 750 MG 24 hr tablet Take 1 tablet (750 mg total) by mouth daily with breakfast. (Patient not taking: Reported on 07/17/2018) 90 tablet 1  . tiZANidine (ZANAFLEX) 4 MG tablet Take 1 tablet (4 mg total) by mouth every 6 (six) hours as needed for muscle spasms. (Patient not taking: Reported on 07/17/2018) 30 tablet 0   No current facility-administered medications for this visit.     OBJECTIVE: Vitals:   07/17/18 1515  BP: (!) 160/85  Pulse: (!) 108  Resp: 18  Temp: (!) 96.7 F (35.9 C)      Body mass index is 55.75 kg/m.    ECOG FS:0 - Asymptomatic  General: Well-developed, well-nourished, no acute distress. Eyes: Pink conjunctiva, anicteric sclera. HEENT: Normocephalic, moist mucous membranes, clear oropharnyx. Lungs: Clear to auscultation bilaterally. Heart: Regular rate and rhythm. No rubs, murmurs, or gallops. Abdomen: Soft, nontender, nondistended. No organomegaly noted, normoactive bowel sounds. Musculoskeletal: No edema, cyanosis, or clubbing. Neuro: Alert, answering all questions appropriately. Cranial nerves grossly intact. Skin: No rashes or petechiae noted. Psych: Normal affect. Lymphatics: No cervical, calvicular, axillary or inguinal LAD.   LAB RESULTS:  Lab Results  Component Value Date   NA 141 07/12/2018   K 4.4 07/12/2018   CL 105 07/12/2018  CO2 26 07/12/2018   GLUCOSE 85 07/12/2018   BUN 16 07/12/2018   CREATININE 0.77 07/12/2018   CALCIUM 9.8 07/12/2018   PROT 7.8 07/12/2018   ALBUMIN 3.9 06/25/2018   AST 21 07/12/2018   ALT 25 07/12/2018   ALKPHOS 58 06/25/2018   BILITOT 0.3 07/12/2018   GFRNONAA 97 07/12/2018   GFRAA 112 07/12/2018    Lab Results  Component Value Date   WBC 17.5 (H) 07/12/2018   NEUTROABS 11,358 (H) 07/12/2018   HGB 11.2 (L) 07/12/2018   HCT 37.2 07/12/2018   MCV 77.7 (L) 07/12/2018   PLT 611 (H) 07/12/2018   Lab Results  Component Value Date   IRON 20 (L) 07/12/2018   TIBC 429 07/12/2018   IRONPCTSAT 5 (L) 07/12/2018   Lab Results  Component Value Date   FERRITIN 18 07/12/2018     STUDIES: Dg Chest 2 View  Result Date: 06/25/2018 CLINICAL DATA:  Left-sided chest pain for several months with dyspnea. EXAM: CHEST - 2 VIEW COMPARISON:  08/25/2017 FINDINGS: Cardiomegaly without aortic aneurysm. No pulmonary consolidation, effusion or pneumothorax. No overt pulmonary edema. No acute nor suspicious osseous abnormality. IMPRESSION: No active cardiopulmonary disease.  Stable mild cardiomegaly. Electronically  Signed   By: Ashley Royalty M.D.   On: 06/25/2018 23:41   Dg Clavicle Left  Result Date: 07/11/2018 CLINICAL DATA:  Eight month history of left shoulder and clavicular pain. Symptoms have been worse since a fall 6 weeks ago. EXAM: LEFT CLAVICLE - 2+ VIEWS COMPARISON:  Left shoulder series of today's date FINDINGS: The left clavicle is subjectively adequately mineralized. The sternoclavicular and AC joints are grossly normal. There is no acute or healing fracture. IMPRESSION: There is no acute or chronic bony abnormality of the left clavicle. Electronically Signed   By: David  Martinique M.D.   On: 07/11/2018 08:19   US Venous Img Lower Unilateral Left  Result Date: 06/26/2018 CLINICAL DATA:  Left calf pain and edema EXAM: Left LOWER EXTREMITY VENOUS DOPPLER ULTRASOUND TECHNIQUE: Gray-scale sonography with graded compression, as well as color Doppler and duplex ultrasound were performed to evaluate the lower extremity deep venous systems from the level of the common femoral vein and including the common femoral, femoral, profunda femoral, popliteal and calf veins including the posterior tibial, peroneal and gastrocnemius veins when visible. The superficial great saphenous vein was also interrogated. Spectral Doppler was utilized to evaluate flow at rest and with distal augmentation maneuvers in the common femoral, femoral and popliteal veins. COMPARISON:  05/10/2018 FINDINGS: Contralateral Common Femoral Vein: Respiratory phasicity is normal and symmetric with the symptomatic side. No evidence of thrombus. Normal compressibility. Common Femoral Vein: No evidence of thrombus. Normal compressibility, respiratory phasicity and response to augmentation. Saphenofemoral Junction: No evidence of thrombus. Normal compressibility and flow on color Doppler imaging. Profunda Femoral Vein: No evidence of thrombus. Normal compressibility and flow on color Doppler imaging. Femoral Vein: No evidence of thrombus. Normal  compressibility, respiratory phasicity and response to augmentation. Popliteal Vein: No evidence of thrombus. Normal compressibility, respiratory phasicity and response to augmentation. Calf Veins: No evidence of thrombus. Normal compressibility and flow on color Doppler imaging. IMPRESSION: No evidence of deep venous thrombosis. Electronically Signed   By: Donavan Foil M.D.   On: 06/26/2018 01:52   Dg Shoulder Left  Result Date: 07/11/2018 CLINICAL DATA:  Chronic left shoulder and clavicular pain for the past 8 months. The patient reports a fall 6 weeks ago and has had increased pain since then. EXAM: LEFT  SHOULDER - 2+ VIEW COMPARISON:  Left clavicle series of today's date and left shoulder series of March 14, 2018. FINDINGS: The bones are subjectively adequately mineralized. The joint spaces are well maintained. The subacromial subdeltoid space is normal. There is no acute or healing fracture. The observed portions of the upper left ribs are normal. IMPRESSION: There is no acute bony abnormality of the left shoulder nor evidence of significant degenerative change. Electronically Signed   By: David  Martinique M.D.   On: 07/11/2018 08:18   Dg C-arm 1-60 Min-no Report  Result Date: 07/05/2018 Fluoroscopy was utilized by the requesting physician.  No radiographic interpretation.   Dg C-arm 1-60 Min-no Report  Result Date: 06/19/2018 Fluoroscopy was utilized by the requesting physician.  No radiographic interpretation.    ASSESSMENT: Iron deficiency anemia, leukocytosis, thrombocytosis.  PLAN:    1.  Iron deficiency anemia: Patient noted to have mildly decreased hemoglobin with decreased iron stores.  Return to clinic in 2 weeks to receive 510 mg IV Feraheme.  Patient will then receive an infusion 1 week later.  If her hemoglobin and iron stores do not improve with IV iron, will initiate full anemia work-up at that time. 2.  Thrombocytosis: Likely second to iron deficiency.  IV iron as above. 3.   Leukocytosis: Patient noted to have a lymphocyte predominance.  Will send peripheral blood flow cytometry for further evaluation.  Return to clinic in 2 weeks as above to discuss the results. 4.  Shoulder pain: Imaging as above.  Continue monitoring and treatment per primary care.  I spent a total of 45 minutes face-to-face with the patient of which greater than 50% of the visit was spent in counseling and coordination of care as detailed above.   Patient expressed understanding and was in agreement with this plan. She also understands that She can call clinic at any time with any questions, concerns, or complaints.   Cancer Staging No matching staging information was found for the patient.  Lloyd Huger, MD   07/18/2018 12:33 PM

## 2018-07-19 ENCOUNTER — Telehealth: Payer: Self-pay | Admitting: *Deleted

## 2018-07-19 ENCOUNTER — Ambulatory Visit
Admission: RE | Admit: 2018-07-19 | Discharge: 2018-07-19 | Disposition: A | Discharge status: Home / Self Care | Payer: Medicaid Other | Source: Ambulatory Visit | Attending: Pain Medicine | Admitting: Pain Medicine

## 2018-07-19 ENCOUNTER — Encounter: Payer: Self-pay | Admitting: Pain Medicine

## 2018-07-19 ENCOUNTER — Ambulatory Visit (HOSPITAL_BASED_OUTPATIENT_CLINIC_OR_DEPARTMENT_OTHER): Payer: Medicaid Other | Admitting: Pain Medicine

## 2018-07-19 ENCOUNTER — Inpatient Hospital Stay: Payer: Medicaid Other

## 2018-07-19 ENCOUNTER — Other Ambulatory Visit: Payer: Self-pay

## 2018-07-19 ENCOUNTER — Ambulatory Visit: Payer: Medicaid Other | Admitting: Pain Medicine

## 2018-07-19 ENCOUNTER — Encounter: Payer: Self-pay | Admitting: Family Medicine

## 2018-07-19 VITALS — BP 133/81 | HR 92 | Temp 98.9°F | Resp 23 | Ht 64.0 in | Wt 342.0 lb

## 2018-07-19 DIAGNOSIS — Z79891 Long term (current) use of opiate analgesic: Secondary | ICD-10-CM | POA: Diagnosis not present

## 2018-07-19 DIAGNOSIS — M5136 Other intervertebral disc degeneration, lumbar region: Secondary | ICD-10-CM | POA: Insufficient documentation

## 2018-07-19 DIAGNOSIS — Z7984 Long term (current) use of oral hypoglycemic drugs: Secondary | ICD-10-CM | POA: Insufficient documentation

## 2018-07-19 DIAGNOSIS — M25562 Pain in left knee: Secondary | ICD-10-CM

## 2018-07-19 DIAGNOSIS — Z79899 Other long term (current) drug therapy: Secondary | ICD-10-CM | POA: Diagnosis not present

## 2018-07-19 DIAGNOSIS — M25561 Pain in right knee: Secondary | ICD-10-CM

## 2018-07-19 DIAGNOSIS — M545 Low back pain, unspecified: Secondary | ICD-10-CM

## 2018-07-19 DIAGNOSIS — G8929 Other chronic pain: Secondary | ICD-10-CM | POA: Diagnosis present

## 2018-07-19 DIAGNOSIS — Z7951 Long term (current) use of inhaled steroids: Secondary | ICD-10-CM | POA: Insufficient documentation

## 2018-07-19 DIAGNOSIS — Z888 Allergy status to other drugs, medicaments and biological substances status: Secondary | ICD-10-CM | POA: Diagnosis not present

## 2018-07-19 DIAGNOSIS — D509 Iron deficiency anemia, unspecified: Secondary | ICD-10-CM | POA: Diagnosis not present

## 2018-07-19 DIAGNOSIS — M170 Bilateral primary osteoarthritis of knee: Secondary | ICD-10-CM | POA: Diagnosis not present

## 2018-07-19 DIAGNOSIS — Z7982 Long term (current) use of aspirin: Secondary | ICD-10-CM | POA: Insufficient documentation

## 2018-07-19 DIAGNOSIS — M79604 Pain in right leg: Secondary | ICD-10-CM

## 2018-07-19 DIAGNOSIS — F419 Anxiety disorder, unspecified: Secondary | ICD-10-CM | POA: Insufficient documentation

## 2018-07-19 DIAGNOSIS — M79605 Pain in left leg: Secondary | ICD-10-CM

## 2018-07-19 DIAGNOSIS — D72829 Elevated white blood cell count, unspecified: Secondary | ICD-10-CM

## 2018-07-19 DIAGNOSIS — M51369 Other intervertebral disc degeneration, lumbar region without mention of lumbar back pain or lower extremity pain: Secondary | ICD-10-CM

## 2018-07-19 LAB — CBC WITH DIFFERENTIAL/PLATELET
ABS IMMATURE GRANULOCYTES: 0.06 10*3/uL (ref 0.00–0.07)
BASOS PCT: 0 %
Basophils Absolute: 0 10*3/uL (ref 0.0–0.1)
EOS ABS: 0.2 10*3/uL (ref 0.0–0.5)
Eosinophils Relative: 2 %
HCT: 33.1 % — ABNORMAL LOW (ref 36.0–46.0)
Hemoglobin: 9.8 g/dL — ABNORMAL LOW (ref 12.0–15.0)
Immature Granulocytes: 0 %
Lymphocytes Relative: 29 %
Lymphs Abs: 4 10*3/uL (ref 0.7–4.0)
MCH: 23.3 pg — ABNORMAL LOW (ref 26.0–34.0)
MCHC: 29.6 g/dL — AB (ref 30.0–36.0)
MCV: 78.8 fL — ABNORMAL LOW (ref 80.0–100.0)
Monocytes Absolute: 0.7 10*3/uL (ref 0.1–1.0)
Monocytes Relative: 5 %
NEUTROS ABS: 8.7 10*3/uL — AB (ref 1.7–7.7)
Neutrophils Relative %: 64 %
PLATELETS: 459 10*3/uL — AB (ref 150–400)
RBC: 4.2 MIL/uL (ref 3.87–5.11)
RDW: 17 % — AB (ref 11.5–15.5)
WBC: 13.7 10*3/uL — AB (ref 4.0–10.5)
nRBC: 0 % (ref 0.0–0.2)

## 2018-07-19 MED ORDER — ROPIVACAINE HCL 2 MG/ML IJ SOLN
4.0000 mL | Freq: Once | INTRAMUSCULAR | Status: AC
  Administered 2018-07-19: 4 mL via INTRA_ARTICULAR

## 2018-07-19 MED ORDER — FENTANYL CITRATE (PF) 100 MCG/2ML IJ SOLN
25.0000 ug | INTRAMUSCULAR | Status: DC | PRN
  Administered 2018-07-19: 50 ug via INTRAVENOUS

## 2018-07-19 MED ORDER — SODIUM CHLORIDE (PF) 0.9 % IJ SOLN
INTRAMUSCULAR | Status: AC
  Filled 2018-07-19: qty 10

## 2018-07-19 MED ORDER — LIDOCAINE HCL (PF) 1 % IJ SOLN
4.0000 mL | Freq: Once | INTRAMUSCULAR | Status: AC
  Administered 2018-07-19: 4 mL
  Filled 2018-07-19: qty 5

## 2018-07-19 MED ORDER — TRIAMCINOLONE ACETONIDE 40 MG/ML IJ SUSP
INTRAMUSCULAR | Status: AC
  Filled 2018-07-19: qty 1

## 2018-07-19 MED ORDER — SODIUM HYALURONATE (VISCOSUP) 20 MG/2ML IX SOSY
2.0000 mL | PREFILLED_SYRINGE | Freq: Once | INTRA_ARTICULAR | Status: AC
  Administered 2018-07-19: 2 mL via INTRA_ARTICULAR

## 2018-07-19 MED ORDER — LIDOCAINE HCL 2 % IJ SOLN
20.0000 mL | Freq: Once | INTRAMUSCULAR | Status: AC
  Administered 2018-07-19: 400 mg

## 2018-07-19 MED ORDER — ROPIVACAINE HCL 2 MG/ML IJ SOLN
INTRAMUSCULAR | Status: AC
  Filled 2018-07-19: qty 10

## 2018-07-19 MED ORDER — IOPAMIDOL (ISOVUE-M 200) INJECTION 41%
10.0000 mL | Freq: Once | INTRAMUSCULAR | Status: AC
  Administered 2018-07-19: 10 mL via EPIDURAL
  Filled 2018-07-19: qty 10

## 2018-07-19 MED ORDER — FENTANYL CITRATE (PF) 100 MCG/2ML IJ SOLN
INTRAMUSCULAR | Status: AC
  Filled 2018-07-19: qty 2

## 2018-07-19 MED ORDER — MIDAZOLAM HCL 5 MG/5ML IJ SOLN
1.0000 mg | INTRAMUSCULAR | Status: DC | PRN
  Administered 2018-07-19: 3 mg via INTRAVENOUS

## 2018-07-19 MED ORDER — MIDAZOLAM HCL 5 MG/5ML IJ SOLN
INTRAMUSCULAR | Status: AC
  Filled 2018-07-19: qty 5

## 2018-07-19 MED ORDER — LIDOCAINE HCL 2 % IJ SOLN
INTRAMUSCULAR | Status: AC
  Filled 2018-07-19: qty 20

## 2018-07-19 MED ORDER — ROPIVACAINE HCL 2 MG/ML IJ SOLN
2.0000 mL | Freq: Once | INTRAMUSCULAR | Status: AC
  Administered 2018-07-19: 2 mL via EPIDURAL

## 2018-07-19 MED ORDER — LIDOCAINE HCL (PF) 1 % IJ SOLN
INTRAMUSCULAR | Status: AC
  Filled 2018-07-19: qty 5

## 2018-07-19 MED ORDER — LACTATED RINGERS IV SOLN
1000.0000 mL | Freq: Once | INTRAVENOUS | Status: AC
  Administered 2018-07-19: 1000 mL via INTRAVENOUS

## 2018-07-19 MED ORDER — SODIUM CHLORIDE 0.9% FLUSH
2.0000 mL | Freq: Once | INTRAVENOUS | Status: AC
  Administered 2018-07-19: 2 mL

## 2018-07-19 MED ORDER — TRIAMCINOLONE ACETONIDE 40 MG/ML IJ SUSP
40.0000 mg | Freq: Once | INTRAMUSCULAR | Status: AC
  Administered 2018-07-19: 40 mg

## 2018-07-19 NOTE — Progress Notes (Signed)
Patient's Name: Lauren [name]  MRN: [id number]  Referring Provider: Vevelyn Francois, NP  DOB: [birth year]  PCP: Hubbard Hartshorn, FNP  DOS: 07/19/2018  Note by: Gaspar Cola, MD  Service setting: Ambulatory outpatient  Specialty: Interventional Pain Management  Patient type: Established  Location: [facility] (AMB) Pain Management Facility  Visit type: Interventional Procedure   Primary Reason for Visit: Interventional Pain Management Treatment. CC: Procedure  Procedure #1:  Anesthesia, Analgesia, Anxiolysis:  Type: Therapeutic Intra-Articular Hyalgan Knee Injection #5  Region: Lateral infrapatellar Knee Region Level: Knee Joint Laterality: Bilateral  Type: Local Anesthesia Indication(s): Analgesia         Local Anesthetic: Lidocaine 1-2% Route: Infiltration ([place]/IM) IV Access: Declined Sedation: Declined   Position: Sitting   Indications: 1. Osteoarthritis of knee (Bilateral)   2. Chronic knee pain (Primary Area of Pain) (Bilateral) (L>R)    Procedure #2:  Anesthesia, Analgesia, Anxiolysis:  Type: Therapeutic Inter-Laminar Epidural Steroid Injection  #3  Region: Lumbar Level: L4-5 Level. Laterality: Left-Sided Paramedial  Type: Moderate (Conscious) Sedation combined with Local Anesthesia Indication(s): Analgesia and Anxiety Route: Intravenous (IV) IV Access: Secured Sedation: Meaningful verbal contact was maintained at all times during the procedure  Local Anesthetic: Lidocaine 1-2%  Position: Prone with head of the table was raised to facilitate breathing.   Indications: 1. DDD (degenerative disc disease), lumbar   2. Chronic lower extremity pain (Bilateral)   3. Chronic low back pain (Fifth Area of Pain) (Bilateral) (R>L) w/o sciatica    Pain Score: Pre-procedure: 3 /10 Post-procedure: 0-No pain/10  Pre-op Assessment:  Lauren [name] is a 42 y.o. (year old), female patient, seen today for interventional treatment. She  has a past surgical history that includes Rotator  cuff repair; Knee surgery; Cholecystectomy; Cesarean section (2010); Cesarean section (2011); Tonsillectomy; and Cardiac electrophysiology study and ablation (01/18/2018). [name] has a current medication list which includes the following prescription(s): acetaminophen, albuterol, amoxicillin-clavulanate, aspirin ec, beclomethasone, diltiazem, esomeprazole, furosemide, gabapentin, hydrocodone-acetaminophen, ibuprofen, losartan-hydrochlorothiazide, metoprolol succinate, stiolto respimat, metformin, and tizanidine, and the following Facility-Administered Medications: fentanyl and midazolam. Her primarily concern today is the Procedure  Initial Vital Signs:  Pulse/HCG Rate: 90ECG Heart Rate: (!) 102 Temp: 98.5 F (36.9 C) Resp: 18 BP: (!) 144/77 SpO2: 99 %  BMI: Estimated body mass index is 58.7 kg/m as calculated from the following:   Height as of this encounter: 5\' 4"  (1.626 m).   Weight as of this encounter: 342 lb (155.1 kg).  Risk Assessment: Allergies: Reviewed. She is allergic to nucynta [tapentadol].  Allergy Precautions: None required Coagulopathies: Reviewed. None identified.  Blood-thinner therapy: None at this time Active Infection(s): Reviewed. None identified. Lauren [name] is afebrile  Site Confirmation: Lauren [name] was asked to confirm the procedure and laterality before marking the site Procedure checklist: Completed Consent: Before the procedure and under the influence of no sedative(s), amnesic(s), or anxiolytics, the patient was informed of the treatment options, risks and possible complications. To fulfill our ethical and legal obligations, as recommended by the American Medical Association's Code of Ethics, I have informed the patient of my clinical impression; the nature and purpose of the treatment or procedure; the risks, benefits, and possible complications of the intervention; the alternatives, including doing nothing; the risk(s) and benefit(s) of the  alternative treatment(s) or procedure(s); and the risk(s) and benefit(s) of doing nothing. The patient was provided information about the general risks and possible complications associated with the procedure. These may include, but are not limited to: failure to  achieve desired goals, infection, bleeding, organ or nerve damage, allergic reactions, paralysis, and death. In addition, the patient was informed of those risks and complications associated to the procedure, such as failure to decrease pain; infection; bleeding; organ or nerve damage with subsequent damage to sensory, motor, and/or autonomic systems, resulting in permanent pain, numbness, and/or weakness of one or several areas of the body; allergic reactions; (i.e.: anaphylactic reaction); and/or death. Furthermore, the patient was informed of those risks and complications associated with the medications. These include, but are not limited to: allergic reactions (i.e.: anaphylactic or anaphylactoid reaction(s)); adrenal axis suppression; blood sugar elevation that in diabetics may result in ketoacidosis or comma; water retention that in patients with history of congestive heart failure may result in shortness of breath, pulmonary edema, and decompensation with resultant heart failure; weight gain; swelling or edema; medication-induced neural toxicity; particulate matter embolism and blood vessel occlusion with resultant organ, and/or nervous system infarction; and/or aseptic necrosis of one or more joints. Finally, the patient was informed that Medicine is not an exact science; therefore, there is also the possibility of unforeseen or unpredictable risks and/or possible complications that may result in a catastrophic outcome. The patient indicated having understood very clearly. We have given the patient no guarantees and we have made no promises. Enough time was given to the patient to ask questions, all of which were answered to the patient's  satisfaction. Lauren [name] has indicated that she wanted to continue with the procedure. Attestation: I, the ordering provider, attest that I have discussed with the patient the benefits, risks, side-effects, alternatives, likelihood of achieving goals, and potential problems during recovery for the procedure that I have provided informed consent. Date  Time: 07/19/2018  1:28 PM  Pre-Procedure Preparation:  Monitoring: As per clinic protocol. Respiration, ETCO2, SpO2, BP, heart rate and rhythm monitor placed and checked for adequate function Safety Precautions: Patient was assessed for positional comfort and pressure points before starting the procedure. Time-out: I initiated and conducted the "Time-out" before starting the procedure, as per protocol. The patient was asked to participate by confirming the accuracy of the "Time Out" information. Verification of the correct person, site, and procedure were performed and confirmed by me, the nursing staff, and the patient. "Time-out" conducted as per Joint Commission's Universal Protocol (UP.01.01.01). Time: 8786(7672 epid timeout)  Description of Procedure #1:  Target Area: Knee Joint Approach: Just above the Lateral tibial plateau, lateral to the infrapatellar tendon. Area Prepped: Entire knee area, from the mid-thigh to the mid-shin. Prepping solution: ChloraPrep (2% chlorhexidine gluconate and 70% isopropyl alcohol) Safety Precautions: Aspiration looking for blood return was conducted prior to all injections. At no point did we inject any substances, as a needle was being advanced. No attempts were made at seeking any paresthesias. Safe injection practices and needle disposal techniques used. Medications properly checked for expiration dates. SDV (single dose vial) medications used. Description of the Procedure: Protocol guidelines were followed. The patient was placed in position over the fluoroscopy table. The target area was identified and the  area prepped in the usual manner. Skin & deeper tissues infiltrated with local anesthetic. Appropriate amount of time allowed to pass for local anesthetics to take effect. The procedure needles were then advanced to the target area. Proper needle placement secured. Negative aspiration confirmed. Solution injected in intermittent fashion, asking for systemic symptoms every 0.5cc of injectate. The needles were then removed and the area cleansed, making sure to leave some of the prepping solution back to  take advantage of its long term bactericidal properties. Vitals:   07/19/18 1435 07/19/18 1440 07/19/18 1449 07/19/18 1459  BP: (!) 131/58 (!) 145/75 (!) 120/96 133/81  Pulse: 92     Resp: 16 17 20  (!) 23  Temp:  98.7 F (37.1 C)  98.9 F (37.2 C)  TempSrc:  Temporal  Temporal  SpO2: 100% 100% 100% 100%  Weight:      Height:        Start Time: 1410 hrs. End Time: 1420(epidural stop 1432) hrs. Materials:  Needle(s) Type: Regular needle Gauge: 25G Length: 1.5-in Medication(s): Please see orders for medications and dosing details.  Imaging Guidance for procedure #1:  Type of Imaging Technique: None used Indication(s): N/A Exposure Time: No patient exposure Contrast: None used. Fluoroscopic Guidance: N/A Ultrasound Guidance: N/A Interpretation: N/A  Description of Procedure #2:  Target Area: The interlaminar space, initially targeting the lower laminar border of the superior vertebral body. Approach: Paramedial approach. Area Prepped: Entire Posterior Lumbar Region Prepping solution: ChloraPrep (2% chlorhexidine gluconate and 70% isopropyl alcohol) Safety Precautions: Aspiration looking for blood return was conducted prior to all injections. At no point did we inject any substances, as a needle was being advanced. No attempts were made at seeking any paresthesias. Safe injection practices and needle disposal techniques used. Medications properly checked for expiration dates. SDV (single  dose vial) medications used. Description of the Procedure: Protocol guidelines were followed. The procedure needle was introduced through the skin, ipsilateral to the reported pain, and advanced to the target area. Bone was contacted and the needle walked caudad, until the lamina was cleared. The epidural space was identified using "loss-of-resistance technique" with 2-3 ml of PF-NaCl (0.9% NSS), in a 5cc LOR glass syringe.  Vitals:   07/19/18 1435 07/19/18 1440 07/19/18 1449 07/19/18 1459  BP: (!) 131/58 (!) 145/75 (!) 120/96 133/81  Pulse: 92     Resp: 16 17 20  (!) 23  Temp:  98.7 F (37.1 C)  98.9 F (37.2 C)  TempSrc:  Temporal  Temporal  SpO2: 100% 100% 100% 100%  Weight:      Height:        Start Time: 1410 hrs. End Time: 1420(epidural stop 1432) hrs.  Materials:  Needle(s) Type: Epidural needle Gauge: 17G Length: 3.5-in Medication(s): Please see orders for medications and dosing details.  Imaging Guidance (Spinal) for procedure #2:  Type of Imaging Technique: Fluoroscopy Guidance (Spinal) Indication(s): Assistance in needle guidance and placement for procedures requiring needle placement in or near specific anatomical locations not easily accessible without such assistance. Exposure Time: Please see nurses notes. Contrast: Before injecting any contrast, we confirmed that the patient did not have an allergy to iodine, shellfish, or radiological contrast. Once satisfactory needle placement was completed at the desired level, radiological contrast was injected. Contrast injected under live fluoroscopy. No contrast complications. See chart for type and volume of contrast used. Fluoroscopic Guidance: I was personally present during the use of fluoroscopy. "Tunnel Vision Technique" used to obtain the best possible view of the target area. Parallax error corrected before commencing the procedure. "Direction-depth-direction" technique used to introduce the needle under continuous pulsed  fluoroscopy. Once target was reached, antero-posterior, oblique, and lateral fluoroscopic projection used confirm needle placement in all planes. Images permanently stored in EMR. Interpretation: I personally interpreted the imaging intraoperatively. Adequate needle placement confirmed in multiple planes. Appropriate spread of contrast into desired area was observed. No evidence of afferent or efferent intravascular uptake. No intrathecal or subarachnoid spread  observed. Permanent images saved into the patient's record.  Antibiotic Prophylaxis:   Anti-infectives (From admission, onward)   None     Indication(s): None identified  Post-operative Assessment:  Post-procedure Vital Signs:  Pulse/HCG Rate: 9299 Temp: 98.9 F (37.2 C) Resp: (!) 23 BP: 133/81 SpO2: 100 %  EBL: None  Complications: No immediate post-treatment complications observed by team, or reported by patient.  Note: The patient tolerated the entire procedure well. A repeat set of vitals were taken after the procedure and the patient was kept under observation following institutional policy, for this type of procedure. Post-procedural neurological assessment was performed, showing return to baseline, prior to discharge. The patient was provided with post-procedure discharge instructions, including a section on how to identify potential problems. Should any problems arise concerning this procedure, the patient was given instructions to immediately contact us, at any time, without hesitation. In any case, we plan to contact the patient by telephone for a follow-up status report regarding this interventional procedure.  Comments:  No additional relevant information.  Plan of Care    Imaging Orders     DG C-Arm 1-60 Min-No Report     MR LUMBAR SPINE WO CONTRAST  Procedure Orders     KNEE INJECTION     Lumbar Epidural Injection  Medications ordered for procedure: Meds ordered this encounter  Medications  . lidocaine  (PF) (XYLOCAINE) 1 % injection 4 mL  . ropivacaine (PF) 2 mg/mL (0.2%) (NAROPIN) injection 4 mL  . Sodium Hyaluronate SOSY 2 mL  . Sodium Hyaluronate SOSY 2 mL  . iopamidol (ISOVUE-M) 41 % intrathecal injection 10 mL    Must be Myelogram-compatible. If not available, you may substitute with a water-soluble, non-ionic, hypoallergenic, myelogram-compatible radiological contrast medium.  Marland Kitchen lidocaine (XYLOCAINE) 2 % (with pres) injection 400 mg  . midazolam (VERSED) 5 MG/5ML injection 1-2 mg    Make sure Flumazenil is available in the pyxis when using this medication. If oversedation occurs, administer 0.2 mg IV over 15 sec. If after 45 sec no response, administer 0.2 mg again over 1 min; may repeat at 1 min intervals; not to exceed 4 doses (1 mg)  . fentaNYL (SUBLIMAZE) injection 25-50 mcg    Make sure Narcan is available in the pyxis when using this medication. In the event of respiratory depression (RR< 8/min): Titrate NARCAN (naloxone) in increments of 0.1 to 0.2 mg IV at 2-3 minute intervals, until desired degree of reversal.  . lactated ringers infusion 1,000 mL  . sodium chloride flush (NS) 0.9 % injection 2 mL  . ropivacaine (PF) 2 mg/mL (0.2%) (NAROPIN) injection 2 mL  . triamcinolone acetonide (KENALOG-40) injection 40 mg   Medications administered: We administered lidocaine (PF), ropivacaine (PF) 2 mg/mL (0.2%), Sodium Hyaluronate, Sodium Hyaluronate, iopamidol, lidocaine, midazolam, fentaNYL, lactated ringers, sodium chloride flush, ropivacaine (PF) 2 mg/mL (0.2%), and triamcinolone acetonide.  See the medical record for exact dosing, route, and time of administration.  Disposition: Discharge home  Discharge Date & Time: 07/19/2018; 1501 hrs.   Physician-requested Follow-up: Return for post-procedure eval (2 wks), w/ Dr. Dossie Arbour.  Future Appointments  Date Time Provider [place]  07/30/2018  2:15 PM Lloyd Huger, MD [facility] None  07/30/2018  2:45 PM [facility] [place]  INFUSION CHAIR 5 [facility] None  07/31/2018  3:30 PM [facility]  07/31/2018  4:15 PM [facility]  08/02/2018  2:00 PM Hubbard Hartshorn, FNP [place] [facility]  08/07/2018  2:30 PM Vevelyn Francois, NP [facility] None  08/15/2018  8:15 AM Milinda Pointer, MD [facility] None  05/13/2019  3:00 PM Lawhorn, Lara Mulch, CNM [facility] None   Primary Care Physician: Hubbard Hartshorn, FNP Location: [facility] Note by: Gaspar Cola, MD Date: 07/19/2018; Time: 3:30 PM  Disclaimer:  Medicine is not an Chief Strategy Officer. The only guarantee in medicine is that nothing is guaranteed. It is important to note that the decision to proceed with this intervention was based on the information collected from the patient. The Data and conclusions were drawn from the patient's questionnaire, the interview, and the physical examination. Because the information was provided in large part by the patient, it cannot be guaranteed that it has not been purposely or unconsciously manipulated. Every effort has been made to obtain as much relevant data as possible for this evaluation. It is important to note that the conclusions that lead to this procedure are derived in large part from the available data. Always take into account that the treatment will also be dependent on availability of resources and existing treatment guidelines, considered by other Pain Management Practitioners as being common knowledge and practice, at the time of the intervention. For Medico-Legal purposes, it is also important to point out that variation in procedural techniques and pharmacological choices are the acceptable norm. The indications, contraindications, technique, and results of the above procedure should only be interpreted and judged by a Board-Certified Interventional Pain Specialist with extensive familiarity and expertise in the same exact procedure and technique.

## 2018-07-19 NOTE — Progress Notes (Signed)
Safety precautions to be maintained throughout the outpatient stay will include: orient to surroundings, keep bed in low position, maintain call bell within reach at all times, provide assistance with transfer out of bed and ambulation.  

## 2018-07-19 NOTE — Telephone Encounter (Signed)
[place] [employer] has denied Qvar Pt must try and fail Flovent HFA and or Pulmicort Respule Please advise of change?

## 2018-07-19 NOTE — Patient Instructions (Signed)

## 2018-07-20 ENCOUNTER — Telehealth: Payer: Self-pay

## 2018-07-20 ENCOUNTER — Ambulatory Visit: Payer: Medicaid Other | Attending: Internal Medicine

## 2018-07-20 DIAGNOSIS — G4710 Hypersomnia, unspecified: Secondary | ICD-10-CM | POA: Diagnosis not present

## 2018-07-20 DIAGNOSIS — R0683 Snoring: Secondary | ICD-10-CM | POA: Insufficient documentation

## 2018-07-20 DIAGNOSIS — Z6841 Body Mass Index (BMI) 40.0 and over, adult: Secondary | ICD-10-CM | POA: Diagnosis present

## 2018-07-20 MED ORDER — FLUTICASONE PROPIONATE HFA 220 MCG/ACT IN AERO: 2.0000 | INHALATION_SPRAY | Freq: Two times a day (BID) | RESPIRATORY_TRACT | 2 refills | Status: DC

## 2018-07-20 NOTE — Telephone Encounter (Signed)
Try Flovent HFA

## 2018-07-20 NOTE — Telephone Encounter (Signed)
Pt aware.ss 

## 2018-07-20 NOTE — Telephone Encounter (Signed)
Post procedure phone call. Patient states she is doing good.  

## 2018-07-20 NOTE — Telephone Encounter (Signed)
rx sent

## 2018-07-23 LAB — COMP PANEL: LEUKEMIA/LYMPHOMA

## 2018-07-23 LAB — JAK2 GENOTYPR

## 2018-07-25 DIAGNOSIS — G4710 Hypersomnia, unspecified: Secondary | ICD-10-CM

## 2018-07-26 ENCOUNTER — Ambulatory Visit: Payer: Medicaid Other | Admitting: Dietician

## 2018-07-26 ENCOUNTER — Telehealth: Payer: Self-pay | Admitting: *Deleted

## 2018-07-26 NOTE — Telephone Encounter (Signed)
Patient notified that she does not have OSA. Sleep study normal AHI 3.3 Nothing further needed.

## 2018-07-28 NOTE — Progress Notes (Signed)
[place]  [phone] [phone]  ID: Maresha Anastos Swinford OB: 1976/02/07  MR#: [id number]  NKN#:[id number]  Patient Care Team: Hubbard Hartshorn, FNP as PCP - General (Family Medicine) Yolonda Kida, MD as Consulting Physician (Cardiology)  CHIEF COMPLAINT: Iron deficiency anemia, leukocytosis, thrombocytosis.  INTERVAL HISTORY: Patient returns to clinic today for further evaluation and initiation of IV Feraheme.  She continues to have chronic weakness and fatigue.  She also has bilateral knee pain.  She otherwise feels well. She has no neurologic complaints. She has good appetite and denies weight loss.  She denies chest pain, but admits to dyspnea on exertion.  She denies any nausea, vomiting, constipation, or diarrhea.  She has no urinary complaints.  Patient offers no further specific complaints today.  REVIEW OF SYSTEMS:   Review of Systems  Constitutional: Positive for malaise/fatigue. Negative for fever and weight loss.  Respiratory: Negative.  Negative for cough and shortness of breath.   Cardiovascular: Negative.  Negative for chest pain and leg swelling.  Gastrointestinal: Negative.  Negative for abdominal pain, blood in stool and melena.  Genitourinary: Negative.  Negative for dysuria and hematuria.  Musculoskeletal: Positive for joint pain. Negative for back pain.  Skin: Negative.  Negative for rash.  Neurological: Positive for weakness. Negative for focal weakness and headaches.  Psychiatric/Behavioral: Negative.  The patient is not nervous/anxious.     As per HPI. Otherwise, a complete review of systems is negative.  PAST MEDICAL HISTORY: Past Medical History:  Diagnosis Date  . Advanced maternal age in multigravida   . Asthma   . Depression   . GERD (gastroesophageal reflux disease)   . [place] multiparity   . Headache   . History of carpal tunnel surgery of left wrist 05/2018  . Hypertension   . Leg fracture   . Obesity,  morbid, BMI 40.0-49.9 ([place])   . Post traumatic stress disorder   . Pre-eclampsia   . SVT (supraventricular tachycardia) ([place])    a. prior report of SVT req adenosine;  b. 2011 Holter @ [facility] reportedly showed "irregular heartbeat"; c. 05/2016 Holter: "basically unremarkable" per Dr. Clayborn Bigness;  d. 07/2016 Echo: EF 50-55%. Nl RV fxn.  . Wrist fracture, closed    bilateral    PAST SURGICAL HISTORY: Past Surgical History:  Procedure Laterality Date  . CARDIAC ELECTROPHYSIOLOGY STUDY AND ABLATION  01/18/2018  . CESAREAN SECTION  2010  . CESAREAN SECTION  2011  . CHOLECYSTECTOMY    . KNEE SURGERY    . ROTATOR CUFF REPAIR    . TONSILLECTOMY      FAMILY HISTORY: Family History  Problem Relation Age of Onset  . Diabetes Sister   . Migraines Maternal Aunt   . Throat cancer Maternal Aunt   . Migraines Maternal Uncle   . Migraines Paternal Aunt   . Migraines Paternal Uncle   . Diabetes Sister   . Stomach cancer Sister   . Diabetes Sister   . Diabetes Sister   . Diabetes Sister   . Diabetes Sister   . Ovarian cancer Cousin   . Diabetes Mother   . Diabetes Father   . Migraines Father   . Hypertension Brother   . Coronary artery disease Brother   . Hypertension Brother   . Diabetes Brother   . Breast cancer Neg Hx     ADVANCED DIRECTIVES (Y/N):  N  HEALTH MAINTENANCE: Social History   Tobacco Use  . Smoking status: Never Smoker  . Smokeless tobacco:  Never Used  Substance Use Topics  . Alcohol use: No    Comment: occ.  . Drug use: No    Comment: last used several months ago     Colonoscopy:  PAP:  Bone density:  Lipid panel:  Allergies  Allergen Reactions  . Nucynta [Tapentadol] Hives    Current Outpatient Medications  Medication Sig Dispense Refill  . albuterol (PROVENTIL HFA;VENTOLIN HFA) 108 (90 Base) MCG/ACT inhaler Inhale 2 puffs into the lungs every 6 (six) hours as needed for wheezing or shortness of breath. 1 Inhaler 0  . aspirin EC 81 MG tablet Take 81  mg by mouth daily.    . beclomethasone (QVAR REDIHALER) 80 MCG/ACT inhaler Inhale 1 puff into the lungs 2 (two) times daily. 1 Inhaler 2  . diltiazem (CARDIZEM CD) 180 MG 24 hr capsule Take 1 capsule by mouth daily.    Marland Kitchen esomeprazole (NEXIUM) 40 MG capsule Take 1 capsule (40 mg total) by mouth daily at 12 noon. 90 capsule 1  . fluticasone (FLOVENT HFA) 220 MCG/ACT inhaler Inhale 2 puffs into the lungs 2 (two) times daily. 1 Inhaler 2  . gabapentin (NEURONTIN) 300 MG capsule Take 1 capsule (300 mg total) by mouth 3 (three) times daily. 90 capsule 0  . HYDROcodone-acetaminophen (NORCO) 5-325 MG tablet Take 1 tablet by mouth every 8 (eight) hours as needed for moderate pain. 90 tablet 0  . ibuprofen (ADVIL,MOTRIN) 600 MG tablet Take 1 tablet (600 mg total) by mouth every 8 (eight) hours as needed. 30 tablet 0  . losartan-hydrochlorothiazide (HYZAAR) 50-12.5 MG tablet Take 1 tablet by mouth daily. 90 tablet 3  . metFORMIN (GLUCOPHAGE XR) 750 MG 24 hr tablet Take 1 tablet (750 mg total) by mouth daily with breakfast. 90 tablet 1  . metoprolol succinate (TOPROL-XL) 100 MG 24 hr tablet Take 100 mg by mouth daily. Take with or immediately following a meal.    . STIOLTO RESPIMAT 2.5-2.5 MCG/ACT AERS Inhale 2 puffs into the lungs daily. 1 Inhaler 0  . acetaminophen (TYLENOL) 325 MG tablet Take 650 mg by mouth every 6 (six) hours as needed.    . furosemide (LASIX) 40 MG tablet Take 40 mg by mouth daily.  5  . tiZANidine (ZANAFLEX) 4 MG tablet Take 1 tablet (4 mg total) by mouth every 6 (six) hours as needed for muscle spasms. (Patient not taking: Reported on 07/30/2018) 30 tablet 0   No current facility-administered medications for this visit.     OBJECTIVE: Vitals:   07/30/18 1421  Pulse: 97  Resp: 20  Temp: (!) 96.3 F (35.7 C)     Body mass index is 59.25 kg/m.    ECOG FS:0 - Asymptomatic  General: Well-developed, well-nourished, no acute distress. Eyes: Pink conjunctiva, anicteric  sclera. HEENT: Normocephalic, moist mucous membranes. Lungs: Clear to auscultation bilaterally. Heart: Regular rate and rhythm. No rubs, murmurs, or gallops. Abdomen: Soft, nontender, nondistended. No organomegaly noted, normoactive bowel sounds. Musculoskeletal: No edema, cyanosis, or clubbing. Neuro: Alert, answering all questions appropriately. Cranial nerves grossly intact. Skin: No rashes or petechiae noted. Psych: Normal affect.  LAB RESULTS:  Lab Results  Component Value Date   NA 141 07/12/2018   K 4.4 07/12/2018   CL 105 07/12/2018   CO2 26 07/12/2018   GLUCOSE 85 07/12/2018   BUN 16 07/12/2018   CREATININE 0.77 07/12/2018   CALCIUM 9.8 07/12/2018   PROT 7.8 07/12/2018   ALBUMIN 3.9 06/25/2018   AST 21 07/12/2018   ALT 25  07/12/2018   ALKPHOS 58 06/25/2018   BILITOT 0.3 07/12/2018   GFRNONAA 97 07/12/2018   GFRAA 112 07/12/2018    Lab Results  Component Value Date   WBC 13.7 (H) 07/19/2018   NEUTROABS 8.7 (H) 07/19/2018   HGB 9.8 (L) 07/19/2018   HCT 33.1 (L) 07/19/2018   MCV 78.8 (L) 07/19/2018   PLT 459 (H) 07/19/2018   Lab Results  Component Value Date   IRON 20 (L) 07/12/2018   TIBC 429 07/12/2018   IRONPCTSAT 5 (L) 07/12/2018   Lab Results  Component Value Date   FERRITIN 18 07/12/2018     STUDIES: Dg Clavicle Left  Result Date: 07/11/2018 CLINICAL DATA:  Eight month history of left shoulder and clavicular pain. Symptoms have been worse since a fall 6 weeks ago. EXAM: LEFT CLAVICLE - 2+ VIEWS COMPARISON:  Left shoulder series of today's date FINDINGS: The left clavicle is subjectively adequately mineralized. The sternoclavicular and AC joints are grossly normal. There is no acute or healing fracture. IMPRESSION: There is no acute or chronic bony abnormality of the left clavicle. Electronically Signed   By: David  Martinique M.D.   On: 07/11/2018 08:19   Dg Shoulder Left  Result Date: 07/11/2018 CLINICAL DATA:  Chronic left shoulder and  clavicular pain for the past 8 months. The patient reports a fall 6 weeks ago and has had increased pain since then. EXAM: LEFT SHOULDER - 2+ VIEW COMPARISON:  Left clavicle series of today's date and left shoulder series of March 14, 2018. FINDINGS: The bones are subjectively adequately mineralized. The joint spaces are well maintained. The subacromial subdeltoid space is normal. There is no acute or healing fracture. The observed portions of the upper left ribs are normal. IMPRESSION: There is no acute bony abnormality of the left shoulder nor evidence of significant degenerative change. Electronically Signed   By: David  Martinique M.D.   On: 07/11/2018 08:18   Dg C-arm 1-60 Min-no Report  Result Date: 07/19/2018 Fluoroscopy was utilized by the requesting physician.  No radiographic interpretation.   Dg C-arm 1-60 Min-no Report  Result Date: 07/05/2018 Fluoroscopy was utilized by the requesting physician.  No radiographic interpretation.    ASSESSMENT: Iron deficiency anemia, leukocytosis, thrombocytosis.  PLAN:    1.  Iron deficiency anemia: Patient noted to have mildly decreased hemoglobin with decreased iron stores.  All of her other laboratory work is either negative or within normal limits.  Proceed with 510 mg IV Feraheme today.  Patient will then return to clinic in 2 weeks because of the Thanksgiving holiday for second infusion.  She will then return to clinic in 3 months with repeat laboratory work and further evaluation. 2.  Thrombocytosis: Secondary to iron deficiency.  Proceed with Feraheme as above. 3.  Leukocytosis: Patient's white blood cell count is now trending down.  Peripheral blood flow cytometry is negative.  No further intervention is needed.  Monitor.    Patient expressed understanding and was in agreement with this plan. She also understands that She can call clinic at any time with any questions, concerns, or complaints.   Cancer Staging No matching staging information  was found for the patient.  Lloyd Huger, MD   07/31/2018 9:20 AM

## 2018-07-30 ENCOUNTER — Inpatient Hospital Stay: Payer: Medicaid Other

## 2018-07-30 ENCOUNTER — Inpatient Hospital Stay (HOSPITAL_BASED_OUTPATIENT_CLINIC_OR_DEPARTMENT_OTHER): Payer: Medicaid Other | Admitting: Oncology

## 2018-07-30 ENCOUNTER — Other Ambulatory Visit: Payer: Self-pay

## 2018-07-30 VITALS — BP 132/85 | HR 98

## 2018-07-30 VITALS — HR 97 | Temp 96.3°F | Resp 20 | Wt 345.2 lb

## 2018-07-30 DIAGNOSIS — D72829 Elevated white blood cell count, unspecified: Secondary | ICD-10-CM

## 2018-07-30 DIAGNOSIS — D509 Iron deficiency anemia, unspecified: Secondary | ICD-10-CM | POA: Diagnosis not present

## 2018-07-30 MED ORDER — SODIUM CHLORIDE 0.9 % IV SOLN
510.0000 mg | Freq: Once | INTRAVENOUS | Status: AC
  Administered 2018-07-30: 510 mg via INTRAVENOUS
  Filled 2018-07-30: qty 17

## 2018-07-30 MED ORDER — SODIUM CHLORIDE 0.9 % IV SOLN
Freq: Once | INTRAVENOUS | Status: AC
  Administered 2018-07-30: 15:00:00 via INTRAVENOUS
  Filled 2018-07-30: qty 250

## 2018-07-30 NOTE — Progress Notes (Signed)
Here for follow up. BP 182/ 108 w repeat after 10 min of 160/108 p 97   Pt stated she took all antihypertensive medications this am. Stated her Dr Uvaldo Rising is " still trying to figure out how to treat my high BP "  Pt stated mild headache at this time ( level 3 ) and mild light headed ( better than earlier ) she stated. MD aware.

## 2018-07-31 ENCOUNTER — Ambulatory Visit: Admission: RE | Admit: 2018-07-31 | Payer: Medicaid Other | Source: Ambulatory Visit

## 2018-07-31 ENCOUNTER — Ambulatory Visit: Payer: Medicaid Other

## 2018-08-02 ENCOUNTER — Encounter: Payer: Self-pay | Admitting: Family Medicine

## 2018-08-02 ENCOUNTER — Ambulatory Visit: Payer: Medicaid Other | Admitting: Family Medicine

## 2018-08-02 VITALS — BP 134/82 | HR 112 | Temp 98.3°F | Resp 16 | Ht 64.0 in | Wt 343.3 lb

## 2018-08-02 DIAGNOSIS — R109 Unspecified abdominal pain: Secondary | ICD-10-CM

## 2018-08-02 DIAGNOSIS — I10 Essential (primary) hypertension: Secondary | ICD-10-CM | POA: Diagnosis not present

## 2018-08-02 DIAGNOSIS — R590 Localized enlarged lymph nodes: Secondary | ICD-10-CM

## 2018-08-02 DIAGNOSIS — R252 Cramp and spasm: Secondary | ICD-10-CM

## 2018-08-02 DIAGNOSIS — N200 Calculus of kidney: Secondary | ICD-10-CM
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
## 2018-08-04 LAB — BASIC METABOLIC PANEL WITH GFR
BUN: 9 mg/dL (ref 7–25)
CHLORIDE: 103 mmol/L (ref 98–110)
CO2: 25 mmol/L (ref 20–32)
Calcium: 9.6 mg/dL (ref 8.6–10.2)
Creat: 0.78 mg/dL (ref 0.50–1.10)
GFR, Est African American: 110 mL/min/{1.73_m2} (ref >=60)
GFR, Est Non African American: 95 mL/min/{1.73_m2} (ref >=60)
GLUCOSE: 67 mg/dL (ref 65–139)
Potassium: 4 mmol/L (ref 3.5–5.3)
Sodium: 139 mmol/L (ref 135–146)

## 2018-08-04 LAB — URINALYSIS, MICROSCOPIC ONLY

## 2018-08-04 LAB — MAGNESIUM: MAGNESIUM: 1.7 mg/dL (ref 1.5–2.5)

## 2018-08-06 ENCOUNTER — Ambulatory Visit: Payer: Medicaid Other | Admitting: Pain Medicine

## 2018-08-06 ENCOUNTER — Ambulatory Visit: Payer: Medicaid Other

## 2018-08-06 ENCOUNTER — Ambulatory Visit: Payer: Medicaid Other | Admitting: Oncology

## 2018-08-07 ENCOUNTER — Ambulatory Visit: Payer: Medicaid Other | Admitting: Dietician

## 2018-08-07 ENCOUNTER — Encounter: Payer: Medicaid Other | Admitting: Nurse Practitioner

## 2018-08-07 ENCOUNTER — Ambulatory Visit: Payer: Medicaid Other

## 2018-08-13 ENCOUNTER — Ambulatory Visit: Payer: Self-pay

## 2018-08-13 ENCOUNTER — Encounter: Payer: Self-pay | Admitting: General Surgery

## 2018-08-13 ENCOUNTER — Encounter: Payer: Medicaid Other | Admitting: Dietician

## 2018-08-13 ENCOUNTER — Encounter: Payer: Self-pay | Admitting: Nurse Practitioner

## 2018-08-13 ENCOUNTER — Ambulatory Visit (INDEPENDENT_AMBULATORY_CARE_PROVIDER_SITE_OTHER): Payer: Medicaid Other | Admitting: General Surgery

## 2018-08-13 ENCOUNTER — Other Ambulatory Visit: Payer: Self-pay

## 2018-08-13 ENCOUNTER — Ambulatory Visit: Payer: Medicaid Other | Attending: Nurse Practitioner | Admitting: Nurse Practitioner

## 2018-08-13 ENCOUNTER — Encounter: Payer: Self-pay | Admitting: Dietician

## 2018-08-13 VITALS — BP 146/98 | HR 99 | Temp 98.6°F | Ht 64.0 in | Wt 345.0 lb

## 2018-08-13 VITALS — BP 148/86 | HR 101 | Temp 97.2°F | Resp 18 | Ht 64.0 in | Wt 346.0 lb

## 2018-08-13 DIAGNOSIS — M542 Cervicalgia: Secondary | ICD-10-CM | POA: Insufficient documentation

## 2018-08-13 DIAGNOSIS — G894 Chronic pain syndrome: Secondary | ICD-10-CM | POA: Diagnosis not present

## 2018-08-13 DIAGNOSIS — M170 Bilateral primary osteoarthritis of knee: Secondary | ICD-10-CM | POA: Insufficient documentation

## 2018-08-13 DIAGNOSIS — R7303 Prediabetes: Secondary | ICD-10-CM | POA: Diagnosis not present

## 2018-08-13 DIAGNOSIS — M797 Fibromyalgia: Secondary | ICD-10-CM | POA: Insufficient documentation

## 2018-08-13 DIAGNOSIS — M47816 Spondylosis without myelopathy or radiculopathy, lumbar region: Secondary | ICD-10-CM | POA: Diagnosis not present

## 2018-08-13 DIAGNOSIS — M25561 Pain in right knee: Secondary | ICD-10-CM | POA: Diagnosis not present

## 2018-08-13 DIAGNOSIS — Z79899 Other long term (current) drug therapy: Secondary | ICD-10-CM | POA: Diagnosis not present

## 2018-08-13 DIAGNOSIS — I471 Supraventricular tachycardia: Secondary | ICD-10-CM | POA: Insufficient documentation

## 2018-08-13 DIAGNOSIS — M25562 Pain in left knee: Secondary | ICD-10-CM

## 2018-08-13 DIAGNOSIS — M792 Neuralgia and neuritis, unspecified: Secondary | ICD-10-CM

## 2018-08-13 DIAGNOSIS — M25511 Pain in right shoulder: Secondary | ICD-10-CM | POA: Diagnosis not present

## 2018-08-13 DIAGNOSIS — M533 Sacrococcygeal disorders, not elsewhere classified: Secondary | ICD-10-CM | POA: Diagnosis not present

## 2018-08-13 DIAGNOSIS — F4310 Post-traumatic stress disorder, unspecified: Secondary | ICD-10-CM | POA: Insufficient documentation

## 2018-08-13 DIAGNOSIS — Z7984 Long term (current) use of oral hypoglycemic drugs: Secondary | ICD-10-CM | POA: Insufficient documentation

## 2018-08-13 DIAGNOSIS — Z8249 Family history of ischemic heart disease and other diseases of the circulatory system: Secondary | ICD-10-CM | POA: Insufficient documentation

## 2018-08-13 DIAGNOSIS — Z5181 Encounter for therapeutic drug level monitoring: Secondary | ICD-10-CM | POA: Insufficient documentation

## 2018-08-13 DIAGNOSIS — R590 Localized enlarged lymph nodes: Secondary | ICD-10-CM | POA: Diagnosis not present

## 2018-08-13 DIAGNOSIS — I10 Essential (primary) hypertension: Secondary | ICD-10-CM | POA: Insufficient documentation

## 2018-08-13 DIAGNOSIS — F329 Major depressive disorder, single episode, unspecified: Secondary | ICD-10-CM | POA: Insufficient documentation

## 2018-08-13 DIAGNOSIS — R079 Chest pain, unspecified: Secondary | ICD-10-CM | POA: Insufficient documentation

## 2018-08-13 DIAGNOSIS — M25532 Pain in left wrist: Secondary | ICD-10-CM | POA: Diagnosis not present

## 2018-08-13 DIAGNOSIS — Z7982 Long term (current) use of aspirin: Secondary | ICD-10-CM | POA: Diagnosis not present

## 2018-08-13 DIAGNOSIS — M79606 Pain in leg, unspecified: Secondary | ICD-10-CM | POA: Diagnosis not present

## 2018-08-13 DIAGNOSIS — K219 Gastro-esophageal reflux disease without esophagitis: Secondary | ICD-10-CM | POA: Diagnosis not present

## 2018-08-13 DIAGNOSIS — Z9889 Other specified postprocedural states: Secondary | ICD-10-CM | POA: Insufficient documentation

## 2018-08-13 DIAGNOSIS — Z791 Long term (current) use of non-steroidal anti-inflammatories (NSAID): Secondary | ICD-10-CM | POA: Insufficient documentation

## 2018-08-13 DIAGNOSIS — Z9049 Acquired absence of other specified parts of digestive tract: Secondary | ICD-10-CM | POA: Insufficient documentation

## 2018-08-13 DIAGNOSIS — M5136 Other intervertebral disc degeneration, lumbar region: Secondary | ICD-10-CM | POA: Diagnosis not present

## 2018-08-13 DIAGNOSIS — J45909 Unspecified asthma, uncomplicated: Secondary | ICD-10-CM | POA: Insufficient documentation

## 2018-08-13 DIAGNOSIS — R51 Headache: Secondary | ICD-10-CM | POA: Insufficient documentation

## 2018-08-13 DIAGNOSIS — G8929 Other chronic pain: Secondary | ICD-10-CM

## 2018-08-13 DIAGNOSIS — F1190 Opioid use, unspecified, uncomplicated: Secondary | ICD-10-CM

## 2018-08-13 DIAGNOSIS — D170 Benign lipomatous neoplasm of skin and subcutaneous tissue of head, face and neck: Secondary | ICD-10-CM

## 2018-08-13 MED ORDER — GABAPENTIN 300 MG PO CAPS: 300.0000 mg | ORAL_CAPSULE | Freq: Three times a day (TID) | ORAL | 0 refills | Status: DC

## 2018-08-13 MED ORDER — HYDROCODONE-ACETAMINOPHEN 5-325 MG PO TABS: 1.0000 | ORAL_TABLET | Freq: Three times a day (TID) | ORAL | 0 refills | Status: DC | PRN

## 2018-08-13 NOTE — Progress Notes (Signed)
Nursing Pain Medication Assessment:  Safety precautions to be maintained throughout the outpatient stay will include: orient to surroundings, keep bed in low position, maintain call bell within reach at all times, provide assistance with transfer out of bed and ambulation.  Medication Inspection Compliance: Lauren [name] did not comply with our request to bring her pills to be counted. She was reminded that bringing the medication bottles, even when empty, is a requirement.  Medication: None brought in. Pill/Patch Count: None available to be counted. Bottle Appearance: No container available. Did not bring bottle(s) to appointment. Filled Date: N/A Last Medication intake:  Today

## 2018-08-13 NOTE — Patient Instructions (Addendum)
We will see you back in 3 weeks.

## 2018-08-13 NOTE — Patient Instructions (Addendum)
____________________________________________________________________________________________  Medication Rules  Purpose: To inform patients, and their family members, of our rules and regulations.  Applies to: All patients receiving prescriptions (written or electronic).  Pharmacy of record: Pharmacy where electronic prescriptions will be sent. If written prescriptions are taken to a different pharmacy, please inform the nursing staff. The pharmacy listed in the electronic medical record should be the one where you would like electronic prescriptions to be sent.  Electronic prescriptions: In compliance with the [place] Strengthen Opioid Misuse Prevention (STOP) Act of 2017 (Session Law 2017-74/H243), effective September 12, 2018, all controlled substances must be electronically prescribed. Calling prescriptions to the pharmacy will cease to exist.  Prescription refills: Only during scheduled appointments. Applies to all prescriptions.  NOTE: The following applies primarily to controlled substances (Opioid* Pain Medications).   Patient's responsibilities: 1. Pain Pills: Bring all pain pills to every appointment (except for procedure appointments). 2. Pill Bottles: Bring pills in original pharmacy bottle. Always bring the newest bottle. Bring bottle, even if empty. 3. Medication refills: You are responsible for knowing and keeping track of what medications you take and those you need refilled. The day before your appointment: write a list of all prescriptions that need to be refilled. The day of the appointment: give the list to the admitting nurse. Prescriptions will be written only during appointments. If you forget a medication: it will not be "Called in", "Faxed", or "electronically sent". You will need to get another appointment to get these prescribed. No early refills. Do not call asking to have your prescription filled early. 4. Prescription Accuracy: You are responsible for  carefully inspecting your prescriptions before leaving our office. Have the discharge nurse carefully go over each prescription with you, before taking them home. Make sure that your name is accurately spelled, that your address is correct. Check the name and dose of your medication to make sure it is accurate. Check the number of pills, and the written instructions to make sure they are clear and accurate. Make sure that you are given enough medication to last until your next medication refill appointment. 5. Taking Medication: Take medication as prescribed. When it comes to controlled substances, taking less pills or less frequently than prescribed is permitted and encouraged. Never take more pills than instructed. Never take medication more frequently than prescribed.  6. Inform other Doctors: Always inform, all of your healthcare providers, of all the medications you take. 7. Pain Medication from other Providers: You are not allowed to accept any additional pain medication from any other Doctor or Healthcare provider. There are two exceptions to this rule. (see below) In the event that you require additional pain medication, you are responsible for notifying us, as stated below. 8. Medication Agreement: You are responsible for carefully reading and following our Medication Agreement. This must be signed before receiving any prescriptions from our practice. Safely store a copy of your signed Agreement. Violations to the Agreement will result in no further prescriptions. (Additional copies of our Medication Agreement are available upon request.) 9. Laws, Rules, & Regulations: All patients are expected to follow all Federal and State Laws, Statutes, Rules, & Regulations. Ignorance of the Laws does not constitute a valid excuse. The use of any illegal substances is prohibited. 10. Adopted CDC guidelines & recommendations: Target dosing levels will be at or below 60 MME/day. Use of benzodiazepines** is not  recommended.  Exceptions: There are only two exceptions to the rule of not receiving pain medications from other Healthcare Providers. 1.   Exception #1 (Emergencies): In the event of an emergency (i.e.: accident requiring emergency care), you are allowed to receive additional pain medication. However, you are responsible for: As soon as you are able, call our office [phone], at any time of the day or night, and leave a message stating your name, the date and nature of the emergency, and the name and dose of the medication prescribed. In the event that your call is answered by a member of our staff, make sure to document and save the date, time, and the name of the person that took your information.  2. Exception #2 (Planned Surgery): In the event that you are scheduled by another doctor or dentist to have any type of surgery or procedure, you are allowed (for a period no longer than 30 days), to receive additional pain medication, for the acute post-op pain. However, in this case, you are responsible for picking up a copy of our "Post-op Pain Management for Surgeons" handout, and giving it to your surgeon or dentist. This document is available at our office, and does not require an appointment to obtain it. Simply go to our office during business hours (Monday-Thursday from 8:00 AM to 4:00 PM) (Friday 8:00 AM to 12:00 Noon) or if you have a scheduled appointment with [place], prior to your surgery, and ask for it by name. In addition, you will need to provide [place] with your name, name of your surgeon, type of surgery, and date of procedure or surgery.  *Opioid medications include: morphine, codeine, oxycodone, oxymorphone, hydrocodone, hydromorphone, meperidine, tramadol, tapentadol, buprenorphine, fentanyl, methadone. **Benzodiazepine medications include: diazepam (Valium), alprazolam (Xanax), clonazepam (Klonopine), lorazepam (Ativan), clorazepate (Tranxene), chlordiazepoxide (Librium), estazolam (Prosom),  oxazepam (Serax), temazepam (Restoril), triazolam (Halcion) (Last updated: 11/09/2017) ____________________________________________________________________________________________  BMI Assessment: Estimated body mass index is 59.22 kg/m as calculated from the following:   Height as of this encounter: 5\' 4"  (1.626 m).   Weight as of this encounter: 345 lb (156.5 kg).  BMI interpretation table: BMI level Category Range association with higher incidence of chronic pain  <18 kg/m2 Underweight   18.5-24.9 kg/m2 Ideal body weight   25-29.9 kg/m2 Overweight Increased incidence by 20%  30-34.9 kg/m2 Obese (Class I) Increased incidence by 68%  35-39.9 kg/m2 Severe obesity (Class II) Increased incidence by 136%  >40 kg/m2 Extreme obesity (Class III) Increased incidence by 254%   Patient's current BMI Ideal Body weight  Body mass index is 59.22 kg/m. Ideal body weight: 54.7 kg (120 lb 9.5 oz) Adjusted ideal body weight: 95.4 kg (210 lb 5.7 oz)   BMI Readings from Last 4 Encounters:  08/13/18 59.22 kg/m  08/02/18 58.93 kg/m  07/30/18 59.25 kg/m  07/19/18 58.70 kg/m   Wt Readings from Last 4 Encounters:  08/13/18 (!) 345 lb (156.5 kg)  08/02/18 (!) 343 lb 4.8 oz (155.7 kg)  07/30/18 (!) 345 lb 3.2 oz (156.6 kg)  07/19/18 (!) 342 lb (155.1 kg)

## 2018-08-13 NOTE — Progress Notes (Signed)
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 rule. (see below) In the event that you require additional pain medication, you are responsible for notifying us, as stated below. 8. Medication Agreement: You are responsible for carefully reading and following our Medication Agreement. This must be signed before receiving any prescriptions from our practice. Safely store a copy of your signed Agreement. Violations to the Agreement will result in no further prescriptions. (Additional copies of our Medication Agreement are available upon request.) 9. Laws, Rules, & Regulations: All patients are expected to follow all Federal and [employer], [employer], Rules, [employer]. Ignorance of the Laws does not constitute a valid excuse. The use of any illegal substances is prohibited. 10. Adopted CDC guidelines & recommendations: Target dosing levels  will be at or below 60 MME/day. Use of benzodiazepines** is not recommended.  Exceptions: There are only two exceptions to the rule of not receiving pain medications from other Healthcare Providers. 1. Exception #1 (Emergencies): In the event of an emergency (i.e.: accident requiring emergency care), you are allowed to receive additional pain medication. However, you are responsible for: As soon as you are able, call our office [phone], at any time of the day or night, and leave a message stating your name, the date and nature of the emergency, and the name and dose of the medication prescribed. In the event that your call is answered by a member of our staff, make sure to document and save the date, time, and the name of the person that took your information.  2. Exception #2 (Planned Surgery): In the event that you are scheduled by another doctor or dentist to have any type of surgery or procedure, you are allowed (for a period no longer than 30 days), to receive additional pain medication, for the acute post-op pain. However, in this case, you are responsible for picking up a copy of our "Post-op Pain Management for Surgeons" handout, and giving it to your surgeon or dentist. This document is available at our office, and does not require an appointment to obtain it. Simply go to our office during business hours (Monday-Thursday from 8:00 AM to 4:00 PM) (Friday 8:00 AM to 12:00 Noon) or if you have a scheduled appointment with [place], prior to your surgery, and ask for it by name. In addition, you will need to provide [place] with your name, name of your surgeon, type of surgery, and  date of procedure or surgery.  *Opioid medications include: morphine, codeine, oxycodone, oxymorphone, hydrocodone, hydromorphone, meperidine, tramadol, tapentadol, buprenorphine, fentanyl, methadone. **Benzodiazepine medications include: diazepam (Valium), alprazolam (Xanax), clonazepam (Klonopine), lorazepam (Ativan),  clorazepate (Tranxene), chlordiazepoxide (Librium), estazolam (Prosom), oxazepam (Serax), temazepam (Restoril), triazolam (Halcion) (Last updated: 11/09/2017) ____________________________________________________________________________________________  BMI Assessment: Estimated body mass index is 59.22 kg/m as calculated from the following:   Height as of this encounter: '5\' 4"'$  (1.626 m).   Weight as of this encounter: 345 lb (156.5 kg).  BMI interpretation table: BMI level Category Range association with higher incidence of chronic pain  <18 kg/m2 Underweight   18.5-24.9 kg/m2 Ideal body weight   25-29.9 kg/m2 Overweight Increased incidence by 20%  30-34.9 kg/m2 Obese (Class I) Increased incidence by 68%  35-39.9 kg/m2 Severe obesity (Class II) Increased incidence by 136%  >40 kg/m2 Extreme obesity (Class III) Increased incidence by 254%   Patient's current BMI Ideal Body weight  Body mass index is 59.22 kg/m. Ideal body weight: 54.7 kg (120 lb 9.5 oz) Adjusted ideal body weight: 95.4 kg (210 lb 5.7 oz)   BMI Readings from Last 4 Encounters:  08/13/18 59.22 kg/m  08/02/18 58.93 kg/m  07/30/18 59.25 kg/m  07/19/18 58.70 kg/m   Wt Readings from Last 4 Encounters:  08/13/18 (!) 345 lb (156.5 kg)  08/02/18 (!) 343 lb 4.8 oz (155.7 kg)  07/30/18 (!) 345 lb 3.2 oz (156.6 kg)  07/19/18 (!) 342 lb (155.1 kg)

## 2018-08-13 NOTE — Progress Notes (Signed)
Patient ID: Lauren [name], female   DOB: Aug 25, 1976, 42 y.o.   MRN: [id number]  Chief Complaint  Patient presents with  . New Patient (Initial Visit)    cervical lymphadeopathy    HPI Lauren [name] is a 42 y.o. female.   She was referred by Raelyn Ensign, nurse practitioner, for evaluation of a soft tissue mass on her neck.  She states that it has been present for about 3 months.  She says that it has slowly gotten bigger over that time.  It has never decreased in size.  She says it hurts if she turns her neck or moves her arm.  There has never been any erythema fluctuance or drainage from the area.  She denies any history of similar masses anywhere else on her body.  She was placed on a 10-day course of Augmentin without any change.  She says that her temperature was up to 100.3 last week.  She has had no other fevers or chills associated with this mass.  Of note, she is super morbidly obese, and has recently been evaluated for possible bariatric surgery.  She is also wearing a cardiac monitor today for evaluation of supraventricular tachycardia.  She says that her brother recently had a lipoma removed from a similar area on his neck.   Past Medical History:  Diagnosis Date  . Advanced maternal age in multigravida   . Asthma   . Depression   . GERD (gastroesophageal reflux disease)   . [place] multiparity   . Headache   . History of carpal tunnel surgery of left wrist 05/2018  . Hypertension   . Leg fracture   . Obesity, morbid, BMI 40.0-49.9 ([place])   . Post traumatic stress disorder   . Pre-eclampsia   . SVT (supraventricular tachycardia) ([place])    a. prior report of SVT req adenosine;  b. 2011 Holter @ [facility] reportedly showed "irregular heartbeat"; c. 05/2016 Holter: "basically unremarkable" per Dr. Clayborn Bigness;  d. 07/2016 Echo: EF 50-55%. Nl RV fxn.  . Wrist fracture, closed    bilateral    Past Surgical History:  Procedure Laterality Date  . CARDIAC ELECTROPHYSIOLOGY STUDY AND ABLATION   01/18/2018  . CESAREAN SECTION  2010  . CESAREAN SECTION  2011  . CHOLECYSTECTOMY    . KNEE SURGERY    . ROTATOR CUFF REPAIR    . TONSILLECTOMY      Family History  Problem Relation Age of Onset  . Diabetes Sister   . Migraines Maternal Aunt   . Throat cancer Maternal Aunt   . Migraines Maternal Uncle   . Migraines Paternal Aunt   . Migraines Paternal Uncle   . Diabetes Sister   . Stomach cancer Sister   . Diabetes Sister   . Diabetes Sister   . Diabetes Sister   . Diabetes Sister   . Ovarian cancer Cousin   . Diabetes Mother   . Diabetes Father   . Migraines Father   . Hypertension Brother   . Coronary artery disease Brother   . Hypertension Brother   . Diabetes Brother   . Breast cancer Neg Hx     Social History Social History   Tobacco Use  . Smoking status: Never Smoker  . Smokeless tobacco: Never Used  Substance Use Topics  . Alcohol use: No    Comment: occ.  . Drug use: No    Comment: last used several months ago    Allergies  Allergen Reactions  . Nucynta [Tapentadol]  Hives    Current Outpatient Medications  Medication Sig Dispense Refill  . acetaminophen (TYLENOL) 325 MG tablet Take 650 mg by mouth every 6 (six) hours as needed.    Marland Kitchen albuterol (PROVENTIL HFA;VENTOLIN HFA) 108 (90 Base) MCG/ACT inhaler Inhale 2 puffs into the lungs every 6 (six) hours as needed for wheezing or shortness of breath. 1 Inhaler 0  . aspirin EC 81 MG tablet Take 81 mg by mouth daily.    . beclomethasone (QVAR REDIHALER) 80 MCG/ACT inhaler Inhale 1 puff into the lungs 2 (two) times daily. 1 Inhaler 2  . diltiazem (CARDIZEM CD) 180 MG 24 hr capsule Take 1 capsule by mouth daily.    Marland Kitchen esomeprazole (NEXIUM) 40 MG capsule Take 1 capsule (40 mg total) by mouth daily at 12 noon. 90 capsule 1  . fluticasone (FLOVENT HFA) 220 MCG/ACT inhaler Inhale 2 puffs into the lungs 2 (two) times daily. 1 Inhaler 2  . furosemide (LASIX) 40 MG tablet Take 40 mg by mouth daily.  5  .  gabapentin (NEURONTIN) 300 MG capsule Take 1 capsule (300 mg total) by mouth 3 (three) times daily. 90 capsule 0  . HYDROcodone-acetaminophen (NORCO) 5-325 MG tablet Take 1 tablet by mouth every 8 (eight) hours as needed for moderate pain. 90 tablet 0  . ibuprofen (ADVIL,MOTRIN) 600 MG tablet Take 1 tablet (600 mg total) by mouth every 8 (eight) hours as needed. 30 tablet 0  . losartan-hydrochlorothiazide (HYZAAR) 50-12.5 MG tablet Take 1 tablet by mouth daily. 90 tablet 3  . metFORMIN (GLUCOPHAGE XR) 750 MG 24 hr tablet Take 1 tablet (750 mg total) by mouth daily with breakfast. 90 tablet 1  . metoprolol succinate (TOPROL-XL) 100 MG 24 hr tablet Take 100 mg by mouth daily. Take with or immediately following a meal.    . STIOLTO RESPIMAT 2.5-2.5 MCG/ACT AERS Inhale 2 puffs into the lungs daily. 1 Inhaler 0  . tiZANidine (ZANAFLEX) 4 MG tablet Take 1 tablet (4 mg total) by mouth every 6 (six) hours as needed for muscle spasms. 30 tablet 0   No current facility-administered medications for this visit.     Review of Systems Review of Systems  Respiratory: Positive for shortness of breath.   Gastrointestinal:       Reflux  Hematological:       Anemia  All other systems reviewed and are negative.   Blood pressure (!) 148/86, pulse (!) 101, temperature (!) 97.2 F (36.2 C), temperature source Temporal, resp. rate 18, height 5\' 4"  (1.626 m), weight (!) 346 lb (156.9 kg), SpO2 99 %.  Physical Exam Physical Exam  Constitutional: She is oriented to person, place, and time. No distress.  Super-morbidly obese (BMI 59.4)  HENT:  Head: Normocephalic and atraumatic.  Mouth/Throat: No oropharyngeal exudate.  Eyes: Pupils are equal, round, and reactive to light. No scleral icterus.  Wearing glasses  Neck: No tracheal deviation present. No thyromegaly present.    Difficult exam secondary to patient body habitus.  Soft tissue palpated in the area of patient concern.  Mobile, not consistent with  lymph nodes.  No fluctuance, erythema, or drainage noted. No bruit in mass.  Cardiovascular:  Tachycardic, regular rhythm.  Distant heart tones due to obesity. Cardiac monitor in place.  Pulmonary/Chest: Effort normal and breath sounds normal.  Abdominal: Soft. Bowel sounds are normal.  Musculoskeletal: She exhibits no tenderness or deformity.  Lymphadenopathy:    She has no cervical adenopathy.  Neurological: She is alert and oriented to  person, place, and time.  Skin: Skin is warm and dry.  Skin irritation at site of cardiac monitor.  Psychiatric: She has a normal mood and affect.    Data Reviewed EMR reviewed.  Appears to have chronically elevated WBC for the past year.   Assessment    Soft tissue assymetry appreciated on exam. Bedside ultrasound performed in clinic.  Technically limited by patient body habitus, but possibly consistent with a small lipoma in the area.     Plan    We discussed the findings from my exam and imaging. I'm not convinced that her reported symptoms are entirely due to this small area and cautioned her that surgical removal may not improve anything for her.  She wants to think about whether to pursue more aggressive intervention (operative removal) versus continued observation.  Will return in 3 weeks for further discussion.       Fredirick Maudlin 08/13/2018, 10:28 AM

## 2018-08-14 ENCOUNTER — Inpatient Hospital Stay: Payer: Medicaid Other | Attending: Oncology

## 2018-08-14 VITALS — BP 137/86 | HR 91 | Temp 96.0°F | Resp 18

## 2018-08-14 DIAGNOSIS — D509 Iron deficiency anemia, unspecified: Secondary | ICD-10-CM | POA: Diagnosis present

## 2018-08-14 MED ORDER — SODIUM CHLORIDE 0.9 % IV SOLN
510.0000 mg | Freq: Once | INTRAVENOUS | Status: AC
  Administered 2018-08-14: 510 mg via INTRAVENOUS
  Filled 2018-08-14: qty 17

## 2018-08-14 MED ORDER — SODIUM CHLORIDE 0.9 % IV SOLN
Freq: Once | INTRAVENOUS | Status: AC
  Administered 2018-08-14: 14:00:00 via INTRAVENOUS
  Filled 2018-08-14: qty 250

## 2018-08-14 NOTE — Progress Notes (Signed)
Patient's Name: Lauren [name]  MRN: [id number]  Referring Provider: Hubbard Hartshorn, FNP  DOB: [birth year]  PCP: Hubbard Hartshorn, FNP  DOS: 08/15/2018  Note by: Gaspar Cola, MD  Service setting: Ambulatory outpatient  Specialty: Interventional Pain Management  Location: [facility] (AMB) Pain Management Facility    Patient type: Established   Primary Reason(s) for Visit: Encounter for post-procedure evaluation of chronic illness with mild to moderate exacerbation CC: Leg Pain (left leg)  HPI  [name] is a 42 y.o. year old, female patient, who comes today for a post-procedure evaluation. She has Morbid obesity ([place]); Depression; Chest pain; SVT (supraventricular tachycardia) ([place]); Chronic knee pain (Primary Area of Pain) (Bilateral) (L>R); Chronic daily headache; Chronic pain in right shoulder; Chronic generalized pain; Chronic, continuous use of opioids; HTN (hypertension), benign; Fibromyalgia; Gastro-esophageal reflux disease without esophagitis; Heart palpitations; History of maternal deep vein thrombosis (DVT); Hx of abnormal cervical Pap smear; PTSD (post-traumatic stress disorder); Snoring; Stress incontinence; Transaminitis; Chronic wrist pain (Secondary Area of Pain) (Left); Chronic shoulder pain (Fourth Area of Pain) (Bilateral) (L>R); 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 (Bilateral); Chronic pain of left upper extremity (R>L); Chronic neck pain (Tertiary Area of Pain) (Bilateral) (L>R); Chronic lower extremity pain (Bilateral); Chronic sacroiliac joint pain; Chronic pain syndrome; Opiate use; Pharmacologic therapy; Disorder of skeletal system; Problems influencing health status; Elevated C-reactive protein (CRP); Elevated sed rate; Chronic musculoskeletal pain; Osteoarthritis of knee (Bilateral); Cervicalgia; Chronic low back pain (Fifth Area of Pain) (Bilateral) (R>L) w/o sciatica; DDD (degenerative disc disease), lumbar; Morbid obesity with BMI of 50.0-59.9, adult ([place]);  Neurogenic pain; Impaired ambulation; Asthma; Lymphadenopathy, cervical; Thrombocytosis ([place]); Leukocytosis; Anemia; Prediabetes; Iron deficiency anemia; Lumbar spondylosis; Chronic knee pain (Primary Area of Pain) (Left); Lipoma of left shoulder; and Right forearm pain on their problem list. Her primarily concern today is the Leg Pain (left leg)  Pain Assessment: Location: Right, Left Leg Radiating: dENIES Onset: More than a month ago Duration: Chronic pain Quality:   Severity: 1 /10 (subjective, self-reported pain score)  Note: Reported level is compatible with observation.                         When using our objective Pain Scale, levels between 6 and 10/10 are said to belong in an emergency room, as it progressively worsens from a 6/10, described as severely limiting, requiring emergency care not usually available at an outpatient pain management facility. At a 6/10 level, communication becomes difficult and requires great effort. Assistance to reach the emergency department may be required. Facial flushing and profuse sweating along with potentially dangerous increases in heart rate and blood pressure will be evident. Effect on ADL: i have alot of pain while walking and getting out of the car, unable to sleep at night Timing: Constant Modifying factors: Procedures and heating pad BP: 135/82  HR: 81  [name] comes in today for post-procedure evaluation.  On 07/19/2018 we completed the series of 5 bilateral intra-articular Hyalgan knee injections for this patient.  In addition, we completed a left-sided L4-5 interlaminar LESI #3, under fluoroscopic guidance and IV sedation.  Further details on both, my assessment(s), as well as the proposed treatment plan, please see below.  Post-Procedure Assessment  07/19/2018 Procedure: Therapeutic series #1 of 5 intra-articular, bilateral, Hyalgan knee injections completed.  In addition, therapeutic left-sided L4-5 interlaminar LESI #3 under  fluoroscopic guidance and IV sedation. Pre-procedure pain score:  3/10 Post-procedure pain score: 0/10 (  100% relief) Influential Factors: BMI: 42.40 kg/m Intra-procedural challenges: None observed.         Assessment challenges: None detected.              Reported side-effects: None.        Post-procedural adverse reactions or complications: None reported         Sedation: Sedation provided. When no sedatives are used, the analgesic levels obtained are directly associated to the effectiveness of the local anesthetics. However, when sedation is provided, the level of analgesia obtained during the initial 1 hour following the intervention, is believed to be the result of a combination of factors. These factors may include, but are not limited to: 1. The effectiveness of the local anesthetics used. 2. The effects of the analgesic(s) and/or anxiolytic(s) used. 3. The degree of discomfort experienced by the patient at the time of the procedure. 4. The patients ability and reliability in recalling and recording the events. 5. The presence and influence of possible secondary gains and/or psychosocial factors. Reported result: Relief experienced during the 1st hour after the procedure: 100 %(100 ) (Ultra-Short Term Relief)            Interpretative annotation: Clinically appropriate result. Analgesia during this period is likely to be Local Anesthetic and/or IV Sedative (Analgesic/Anxiolytic) related.          Effects of local anesthetic: The analgesic effects attained during this period are directly associated to the localized infiltration of local anesthetics and therefore cary significant diagnostic value as to the etiological location, or anatomical origin, of the pain. Expected duration of relief is directly dependent on the pharmacodynamics of the local anesthetic used. Long-acting (4-6 hours) anesthetics used.  Reported result: Relief during the next 4 to 6 hour after the procedure: 100 %  (Short-Term Relief)            Interpretative annotation: Clinically appropriate result. Analgesia during this period is likely to be Local Anesthetic-related.          Long-term benefit: Defined as the period of time past the expected duration of local anesthetics (1 hour for short-acting and 4-6 hours for long-acting). With the possible exception of prolonged sympathetic blockade from the local anesthetics, benefits during this period are typically attributed to, or associated with, other factors such as analgesic sensory neuropraxia, antiinflammatory effects, or beneficial biochemical changes provided by agents other than the local anesthetics.  Reported result: Extended relief following procedure: 100 %(100 for right knee, 0 for left knee pain is worse) (Long-Term Relief)            Interpretative annotation: Clinically possible results. Good relief. No permanent benefit expected. Inflammation plays a part in the etiology to the pain.          Laboratory Chemistry  Inflammation Markers (CRP: Acute Phase) (ESR: Chronic Phase) Lab Results  Component Value Date   CRP 46 (H) 08/16/2018   ESRSEDRATE 111 (H) 08/16/2018                         Rheumatology Markers Lab Results  Component Value Date   LABURIC 4.3 09/05/2017                        Renal Markers Lab Results  Component Value Date   BUN 9 08/02/2018   CREATININE 0.78 33/29/5188   BCR NOT APPLICABLE 41/66/0630   GFRAA 110 08/02/2018   GFRNONAA 95 08/02/2018  Hepatic Markers Lab Results  Component Value Date   AST 21 07/12/2018   ALT 25 07/12/2018   ALBUMIN 3.9 06/25/2018                        Neuropathy Markers Lab Results  Component Value Date   VITAMINB12 564 03/14/2018   HGBA1C 6.3 (H) 05/10/2018                        Hematology Parameters Lab Results  Component Value Date   PLT 459 (H) 07/19/2018   HGB 9.8 (L) 07/19/2018   HCT 33.1 (L) 07/19/2018                        CV  Markers Lab Results  Component Value Date   BNP 32.0 06/25/2018   CKTOTAL 119 01/20/2012   CKMB < 0.5 (L) 01/20/2012   TROPONINI <0.03 06/25/2018                         Note: Lab results reviewed.  Recent Imaging Results   Results for orders placed in visit on 07/19/18  DG C-Arm 1-60 Min-No Report   Narrative Fluoroscopy was utilized by the requesting physician.  No radiographic  interpretation.    Interpretation Report: Fluoroscopy was used during the procedure to assist with needle guidance. The images were interpreted intraoperatively by the requesting physician.  Meds   Current Outpatient Medications:  .  acetaminophen (TYLENOL) 325 MG tablet, Take 650 mg by mouth every 6 (six) hours as needed., Disp: , Rfl:  .  albuterol (PROVENTIL HFA;VENTOLIN HFA) 108 (90 Base) MCG/ACT inhaler, Inhale 2 puffs into the lungs every 6 (six) hours as needed for wheezing or shortness of breath., Disp: 1 Inhaler, Rfl: 0 .  aspirin EC 81 MG tablet, Take 81 mg by mouth daily., Disp: , Rfl:  .  beclomethasone (QVAR REDIHALER) 80 MCG/ACT inhaler, Inhale 1 puff into the lungs 2 (two) times daily., Disp: 1 Inhaler, Rfl: 2 .  diltiazem (CARDIZEM CD) 180 MG 24 hr capsule, Take 1 capsule by mouth daily., Disp: , Rfl:  .  esomeprazole (NEXIUM) 40 MG capsule, Take 1 capsule (40 mg total) by mouth daily at 12 noon., Disp: 90 capsule, Rfl: 1 .  fluticasone (FLOVENT HFA) 220 MCG/ACT inhaler, Inhale 2 puffs into the lungs 2 (two) times daily., Disp: 1 Inhaler, Rfl: 2 .  furosemide (LASIX) 40 MG tablet, Take 40 mg by mouth daily., Disp: , Rfl: 5 .  gabapentin (NEURONTIN) 300 MG capsule, Take 1 capsule (300 mg total) by mouth 3 (three) times daily., Disp: 90 capsule, Rfl: 0 .  HYDROcodone-acetaminophen (NORCO) 5-325 MG tablet, Take 1 tablet by mouth every 8 (eight) hours as needed for moderate pain., Disp: 90 tablet, Rfl: 0 .  ibuprofen (ADVIL,MOTRIN) 600 MG tablet, Take 1 tablet (600 mg total) by mouth every 8  (eight) hours as needed., Disp: 30 tablet, Rfl: 0 .  losartan-hydrochlorothiazide (HYZAAR) 50-12.5 MG tablet, Take 1 tablet by mouth daily., Disp: 90 tablet, Rfl: 3 .  metoprolol succinate (TOPROL-XL) 100 MG 24 hr tablet, Take 100 mg by mouth daily. Take with or immediately following a meal., Disp: , Rfl:  .  STIOLTO RESPIMAT 2.5-2.5 MCG/ACT AERS, Inhale 2 puffs into the lungs daily., Disp: 1 Inhaler, Rfl: 0 .  tiZANidine (ZANAFLEX) 4 MG tablet, Take 1 tablet (4 mg total) by mouth  every 6 (six) hours as needed for muscle spasms., Disp: 30 tablet, Rfl: 0 .  metFORMIN (GLUCOPHAGE XR) 750 MG 24 hr tablet, Take 2 tablets (1,500 mg total) by mouth daily with breakfast., Disp: 90 tablet, Rfl: 1  ROS  Constitutional: Denies any fever or chills Gastrointestinal: No reported hemesis, hematochezia, vomiting, or acute GI distress Musculoskeletal: Denies any acute onset joint swelling, redness, loss of ROM, or weakness Neurological: No reported episodes of acute onset apraxia, aphasia, dysarthria, agnosia, amnesia, paralysis, loss of coordination, or loss of consciousness  Allergies  Ms. Walkup is allergic to nucynta [tapentadol].  PFSH  Drug: Ms. Thorington  reports that she does not use drugs. Alcohol:  reports that she does not drink alcohol. Tobacco:  reports that she has never smoked. She has never used smokeless tobacco. Medical:  has a past medical history of Advanced maternal age in multigravida, Asthma, Depression, GERD (gastroesophageal reflux disease), Grand multiparity, Headache, History of carpal tunnel surgery of left wrist (05/2018), Hypertension, Leg fracture, Obesity, morbid, BMI 40.0-49.9 ([place]), Post traumatic stress disorder, Pre-eclampsia, SVT (supraventricular tachycardia) ([place]), and Wrist fracture, closed. Surgical: Ms. Pettaway  has a past surgical history that includes Rotator cuff repair; Knee surgery; Cholecystectomy; Cesarean section (2010); Cesarean section (2011);  Tonsillectomy; and Cardiac electrophysiology study and ablation (01/18/2018). Family: family history includes Coronary artery disease in her brother; Diabetes in her brother, father, mother, sister, sister, sister, sister, sister, and sister; Hypertension in her brother and brother; Migraines in her father, maternal aunt, maternal uncle, paternal aunt, and paternal uncle; Ovarian cancer in her cousin; Stomach cancer in her sister; Throat cancer in her maternal aunt.  Constitutional Exam  General appearance: Well nourished, well developed, and well hydrated. In no apparent acute distress Vitals:   08/15/18 0830  BP: 135/82  Pulse: 81  Temp: 98.3 F (36.8 C)  SpO2: 100%  Weight: 247 lb (112 kg)  Height: '5\' 4"'$  (1.626 m)   BMI Assessment: Estimated body mass index is 42.4 kg/m as calculated from the following:   Height as of this encounter: '5\' 4"'$  (1.626 m).   Weight as of this encounter: 247 lb (112 kg).  BMI interpretation table: BMI level Category Range association with higher incidence of chronic pain  <18 kg/m2 Underweight   18.5-24.9 kg/m2 Ideal body weight   25-29.9 kg/m2 Overweight Increased incidence by 20%  30-34.9 kg/m2 Obese (Class I) Increased incidence by 68%  35-39.9 kg/m2 Severe obesity (Class II) Increased incidence by 136%  >40 kg/m2 Extreme obesity (Class III) Increased incidence by 254%   Patient's current BMI Ideal Body weight  Body mass index is 42.4 kg/m. Ideal body weight: 54.7 kg (120 lb 9.5 oz) Adjusted ideal body weight: 77.6 kg (171 lb 2.5 oz)   BMI Readings from Last 4 Encounters:  08/15/18 42.40 kg/m  08/13/18 59.39 kg/m  08/13/18 59.22 kg/m  08/02/18 58.93 kg/m   Wt Readings from Last 4 Encounters:  08/15/18 247 lb (112 kg)  08/13/18 (!) 346 lb (156.9 kg)  08/13/18 (!) 345 lb (156.5 kg)  08/02/18 (!) 343 lb 4.8 oz (155.7 kg)  Psych/Mental status: Alert, oriented x 3 (person, place, & time)       Eyes: PERLA Respiratory: No evidence of acute  respiratory distress  Cervical Spine Area Exam  Skin & Axial Inspection: No masses, redness, edema, swelling, or associated skin lesions Alignment: Symmetrical Functional ROM: Unrestricted ROM      Stability: No instability detected Muscle Tone/Strength: Functionally intact. No obvious neuro-muscular anomalies  detected. Sensory (Neurological): Unimpaired Palpation: No palpable anomalies              Upper Extremity (UE) Exam    Side: Right upper extremity  Side: Left upper extremity  Skin & Extremity Inspection: Skin color, temperature, and hair growth are WNL. No peripheral edema or cyanosis. No masses, redness, swelling, asymmetry, or associated skin lesions. No contractures.  Skin & Extremity Inspection: Skin color, temperature, and hair growth are WNL. No peripheral edema or cyanosis. No masses, redness, swelling, asymmetry, or associated skin lesions. No contractures.  Functional ROM: Unrestricted ROM          Functional ROM: Unrestricted ROM          Muscle Tone/Strength: Functionally intact. No obvious neuro-muscular anomalies detected.  Muscle Tone/Strength: Functionally intact. No obvious neuro-muscular anomalies detected.  Sensory (Neurological): Unimpaired          Sensory (Neurological): Unimpaired          Palpation: No palpable anomalies              Palpation: No palpable anomalies              Provocative Test(s):  Phalen's test: deferred Tinel's test: deferred Apley's scratch test (touch opposite shoulder):  Action 1 (Across chest): deferred Action 2 (Overhead): deferred Action 3 (LB reach): deferred   Provocative Test(s):  Phalen's test: deferred Tinel's test: deferred Apley's scratch test (touch opposite shoulder):  Action 1 (Across chest): deferred Action 2 (Overhead): deferred Action 3 (LB reach): deferred    Thoracic Spine Area Exam  Skin & Axial Inspection: No masses, redness, or swelling Alignment: Symmetrical Functional ROM: Unrestricted ROM Stability:  No instability detected Muscle Tone/Strength: Functionally intact. No obvious neuro-muscular anomalies detected. Sensory (Neurological): Unimpaired Muscle strength & Tone: No palpable anomalies  Lumbar Spine Area Exam  Skin & Axial Inspection: No masses, redness, or swelling Alignment: Symmetrical Functional ROM: Unrestricted ROM       Stability: No instability detected Muscle Tone/Strength: Functionally intact. No obvious neuro-muscular anomalies detected. Sensory (Neurological): Unimpaired Palpation: No palpable anomalies       Provocative Tests: Hyperextension/rotation test: deferred today       Lumbar quadrant test (Kemp's test): deferred today       Lateral bending test: deferred today       Patrick's Maneuver: deferred today                   FABER test: deferred today                   S-I anterior distraction/compression test: deferred today         S-I lateral compression test: deferred today         S-I Thigh-thrust test: deferred today         S-I Gaenslen's test: deferred today          Gait & Posture Assessment  Ambulation: Unassisted Gait: Relatively normal for age and body habitus Posture: WNL   Lower Extremity Exam    Side: Right lower extremity  Side: Left lower extremity  Stability: No instability observed          Stability: No instability observed          Skin & Extremity Inspection: Skin color, temperature, and hair growth are WNL. No peripheral edema or cyanosis. No masses, redness, swelling, asymmetry, or associated skin lesions. No contractures.  Skin & Extremity Inspection: Skin color, temperature, and hair growth are WNL. No  peripheral edema or cyanosis. No masses, redness, swelling, asymmetry, or associated skin lesions. No contractures.  Functional ROM: Improved after treatment for knee joint          Functional ROM: Decreased ROM for knee joint          Muscle Tone/Strength: Functionally intact. No obvious neuro-muscular anomalies detected.  Muscle  Tone/Strength: Functionally intact. No obvious neuro-muscular anomalies detected.  Sensory (Neurological): Unimpaired        Sensory (Neurological): Unimpaired        DTR: Patellar: deferred today Achilles: deferred today Plantar: deferred today  DTR: Patellar: deferred today Achilles: deferred today Plantar: deferred today  Palpation: No palpable anomalies  Palpation: No palpable anomalies   Assessment  Primary Diagnosis & Pertinent Problem List: The primary encounter diagnosis was Chronic knee pain (Primary Area of Pain) (Left). Diagnoses of Osteoarthritis of knee (Bilateral), Chronic knee pain (Primary Area of Pain) (Bilateral) (L>R), Lipoma of left shoulder, Right forearm pain, Chronic wrist pain (Secondary Area of Pain) (Left), Chronic neck pain (Tertiary Area of Pain) (Bilateral) (L>R), Chronic shoulder pain (Fourth Area of Pain) (Bilateral) (L>R), Chronic low back pain (Fifth Area of Pain) (Bilateral) (R>L) w/o sciatica, Elevated sed rate, Elevated C-reactive protein (CRP), Disorder of skeletal system, Problems influencing health status, Chronic pain syndrome, and Morbid obesity with BMI of 50.0-59.9, adult ([facility]) were also pertinent to this visit.  Status Diagnosis  Unimproved Stable Improving 1. Chronic knee pain (Primary Area of Pain) (Left)   2. Osteoarthritis of knee (Bilateral)   3. Chronic knee pain (Primary Area of Pain) (Bilateral) (L>R)   4. Lipoma of left shoulder   5. Right forearm pain   6. Chronic wrist pain (Secondary Area of Pain) (Left)   7. Chronic neck pain (Tertiary Area of Pain) (Bilateral) (L>R)   8. Chronic shoulder pain (Fourth Area of Pain) (Bilateral) (L>R)   9. Chronic low back pain (Fifth Area of Pain) (Bilateral) (R>L) w/o sciatica   10. Elevated sed rate   11. Elevated C-reactive protein (CRP)   12. Disorder of skeletal system   13. Problems influencing health status   14. Chronic pain syndrome   15. Morbid obesity with BMI of 50.0-59.9, adult ([place])      Problems updated and reviewed during this visit: No problems updated. Plan of Care  Pharmacotherapy (Medications Ordered): No orders of the defined types were placed in this encounter.  Medications administered today: Rotha N. Stoermer had no medications administered during this visit.   Procedure Orders     TRIGGER POINT INJECTION  Lab Orders     Sedimentation rate     C-reactive protein  Imaging Orders     MR KNEE LEFT WO CONTRAST Referral Orders  No referral(s) requested today   Interventional management options: Planned, scheduled, and/or pending:   NOTE: NO RFA until depression brings her BMI below 35. No further interventional therapies at this time. The patient has already been referred to a bariatric surgery and a medical nutritional consult to work on bringing her weight down since it is the primary cause of all of her problems.   Considering:   Diagnostic left knee genicular nerve block Diagnostic left intra-articular wrist injection Diagnostic bilateral cervical facet nerve block Diagnostic bilateral cervical epidural steroid injection Possible bilateral cervical facet radiofrequency ablation Diagnostic left intra-articular shoulder injection Diagnostic bilateral suprascapular nerve block Possible bilateral suprascapular nerve radiofrequency ablation Diagnostic bilateral lumbar epidural steroid injection Diagnostic bilateral lumbar facet nerve block Possible bilateral lumbar facet radiofrequency ablation  Palliative PRN treatment(s):   Palliative bilateral intra-articular Hyalgan knee injection series #2 under fluoroscopic guidance   Provider-requested follow-up: Return for Procedure (no sedation): (L) Shoulder Lipoma + (R) forearm MNB #1.  Future Appointments  Date Time Provider [place]  08/29/2018  2:30 PM Fredirick Maudlin, MD [facility] None  08/30/2018  7:45 AM Milinda Pointer, MD [facility] None  09/07/2018  3:30 PM [facility]  09/07/2018  4:15 PM [facility]  09/13/2018  8:00 AM Vevelyn Francois, NP [facility] None  11/02/2018  2:00 PM Hubbard Hartshorn, FNP [facility] [facility]  11/06/2018  1:00 PM [facility] None  11/06/2018  1:15 PM Lloyd Huger, MD [facility] None  11/06/2018  1:45 PM [facility] INFUSION CHAIR 1 [facility] None  05/13/2019  3:00 PM Lawhorn, Lara Mulch, CNM [facility] None   Primary Care Physician: Hubbard Hartshorn, FNP Location: [facility] Out[facility] Management Facility Note by: Gaspar Cola, MD Date: 08/15/2018; Time: 9:54 AM

## 2018-08-14 NOTE — Progress Notes (Signed)
Pt refused to stay for 30 min observation post fereheme infusion

## 2018-08-15 ENCOUNTER — Ambulatory Visit: Payer: Medicaid Other | Attending: Pain Medicine | Admitting: Pain Medicine

## 2018-08-15 ENCOUNTER — Other Ambulatory Visit: Payer: Self-pay

## 2018-08-15 ENCOUNTER — Encounter: Payer: Self-pay | Admitting: Pain Medicine

## 2018-08-15 VITALS — BP 135/82 | HR 81 | Temp 98.3°F | Ht 64.0 in | Wt 247.0 lb

## 2018-08-15 DIAGNOSIS — M25512 Pain in left shoulder: Secondary | ICD-10-CM

## 2018-08-15 DIAGNOSIS — M79631 Pain in right forearm: Secondary | ICD-10-CM | POA: Insufficient documentation

## 2018-08-15 DIAGNOSIS — M25561 Pain in right knee: Secondary | ICD-10-CM | POA: Diagnosis not present

## 2018-08-15 DIAGNOSIS — M170 Bilateral primary osteoarthritis of knee: Secondary | ICD-10-CM | POA: Diagnosis not present

## 2018-08-15 DIAGNOSIS — Z6841 Body Mass Index (BMI) 40.0 and over, adult: Secondary | ICD-10-CM | POA: Diagnosis not present

## 2018-08-15 DIAGNOSIS — Z8249 Family history of ischemic heart disease and other diseases of the circulatory system: Secondary | ICD-10-CM | POA: Insufficient documentation

## 2018-08-15 DIAGNOSIS — D1722 Benign lipomatous neoplasm of skin and subcutaneous tissue of left arm: Secondary | ICD-10-CM | POA: Diagnosis not present

## 2018-08-15 DIAGNOSIS — Z791 Long term (current) use of non-steroidal anti-inflammatories (NSAID): Secondary | ICD-10-CM | POA: Insufficient documentation

## 2018-08-15 DIAGNOSIS — G8929 Other chronic pain: Secondary | ICD-10-CM | POA: Insufficient documentation

## 2018-08-15 DIAGNOSIS — Z9049 Acquired absence of other specified parts of digestive tract: Secondary | ICD-10-CM | POA: Insufficient documentation

## 2018-08-15 DIAGNOSIS — Z789 Other specified health status: Secondary | ICD-10-CM

## 2018-08-15 DIAGNOSIS — Z7984 Long term (current) use of oral hypoglycemic drugs: Secondary | ICD-10-CM | POA: Insufficient documentation

## 2018-08-15 DIAGNOSIS — M25511 Pain in right shoulder: Secondary | ICD-10-CM

## 2018-08-15 DIAGNOSIS — Z79899 Other long term (current) drug therapy: Secondary | ICD-10-CM | POA: Diagnosis not present

## 2018-08-15 DIAGNOSIS — G894 Chronic pain syndrome: Secondary | ICD-10-CM | POA: Diagnosis not present

## 2018-08-15 DIAGNOSIS — Z7982 Long term (current) use of aspirin: Secondary | ICD-10-CM | POA: Diagnosis not present

## 2018-08-15 DIAGNOSIS — M25562 Pain in left knee: Secondary | ICD-10-CM | POA: Diagnosis not present

## 2018-08-15 DIAGNOSIS — M899 Disorder of bone, unspecified: Secondary | ICD-10-CM

## 2018-08-15 DIAGNOSIS — M79605 Pain in left leg: Secondary | ICD-10-CM | POA: Insufficient documentation

## 2018-08-15 DIAGNOSIS — M542 Cervicalgia: Secondary | ICD-10-CM | POA: Diagnosis not present

## 2018-08-15 DIAGNOSIS — R7982 Elevated C-reactive protein (CRP): Secondary | ICD-10-CM | POA: Diagnosis not present

## 2018-08-15 DIAGNOSIS — Z9889 Other specified postprocedural states: Secondary | ICD-10-CM | POA: Insufficient documentation

## 2018-08-15 DIAGNOSIS — M545 Low back pain: Secondary | ICD-10-CM

## 2018-08-15 DIAGNOSIS — R700 Elevated erythrocyte sedimentation rate: Secondary | ICD-10-CM

## 2018-08-15 DIAGNOSIS — M25532 Pain in left wrist: Secondary | ICD-10-CM | POA: Insufficient documentation

## 2018-08-15 NOTE — Patient Instructions (Addendum)
_You have been instructed to get labwork drawn and will return tomorrow to get it done in [employer] office.  You have been ordered and MRI and a  TRigger point injectionPreparing for your procedure (without sedation) Instructions: . Oral Intake: Do not eat or drink anything for at least 3 hours prior to your procedure. . Transportation: Unless otherwise stated by your physician, you may drive yourself after the procedure. . Blood Pressure Medicine: Take your blood pressure medicine with a sip of water the morning of the procedure. . Insulin: Take only  of your normal insulin dose. . Preventing infections: Shower with an antibacterial soap the morning of your procedure. . Build-up your immune system: Take 1000 mg of Vitamin C with every meal (3 times a day) the day prior to your procedure. . Pregnancy: If you are pregnant, call and cancel the procedure. . Sickness: If you have a cold, fever, or any active infections, call and cancel the procedure. . Arrival: You must be in the facility at least 30 minutes prior to your scheduled procedure. . Children: Do not bring any children with you. . Dress appropriately: Bring dark clothing that you would not mind if they get stained. . Valuables: Do not bring any jewelry or valuables. Procedure appointments are reserved for interventional treatments only. Marland Kitchen No Prescription Refills. . No medication changes will be discussed during procedure appointments. No disability issues will be discussed.___________________________________________________________________________________________  Weight Management Required  URGENT: Your weight has been found to be adversely affecting your health.  Dear [name]:  Your current There is no height or weight on file to calculate BMI.. Estimated body mass index is 59.39 kg/m as calculated from the following:   Height as of 08/13/18: 5\' 4"  (1.626 m).   Weight as of 08/13/18: 346 lb (156.9 kg).  Your last four  (4) weight and BMI calculations are as follows: Wt Readings from Last 4 Encounters:  08/13/18 (!) 346 lb (156.9 kg)  08/13/18 (!) 345 lb (156.5 kg)  08/02/18 (!) 343 lb 4.8 oz (155.7 kg)  07/30/18 (!) 345 lb 3.2 oz (156.6 kg)   BMI Readings from Last 4 Encounters:  08/13/18 59.39 kg/m  08/13/18 59.22 kg/m  08/02/18 58.93 kg/m  07/30/18 59.25 kg/m    Calculations estimate your ideal body weight to be: Ideal body weight: 54.7 kg (120 lb 9.5 oz) Adjusted ideal body weight: 95.6 kg (210 lb 12.1 oz)  Please use the table below to identify your weight category and associated incidence of chronic pain, secondary to your weight.  BMI interpretation table: BMI level Category Associated incidence of chronic pain  <18 kg/m2 Underweight   18.5-24.9 kg/m2 Ideal body weight   25-29.9 kg/m2 Overweight  20%  30-34.9 kg/m2 Obese (Class I)  68%  35-39.9 kg/m2 Severe obesity (Class II)  136%  >40 kg/m2 Extreme obesity (Class III)  254%   In addition: You will be considered "Morbidly Obese", if your BMI is above 30 and you have one or more of the following conditions that are directly associated with obesity: 1. Type 2 Diabetes (Which in turn can lead to cardiovascular diseases (CVD), stroke, peripheral vascular diseases (PVD), retinopathy, nephropathy, and neuropathy) 2. Cardiovascular Disease  3. Breathing problems (Asthma, obesity-hypoventilation syndrome, obstructive sleep apnea, chronic inflammatory airway disease) 4. Chronic kidney disease 5. Liver disease (nonalcoholic fatty liver disease) 6. High blood pressure 7. Acid reflux (gastroesophageal reflux disease) 8. Osteoarthritis (OA) 9. Low back pain (Lumbar Facet Syndrome) 10. Hip pain (Osteoarthritis of  hip) 11. Knee pain (Osteoarthritis of knee) (patients with a BMI>30 kg/m2 were 6.8 times more likely to develop knee OA than normal-weight individuals) 12. Certain types of cancer. (Epidemiological studies have shown that obesity is a  risk factor for: post-menopausal breast cancer; cancers of the endometrium, colon and kidney; malignant adenomas of the oesophagus. Obese subjects have an approximately 1.5-3.5-fold increased risk of developing these cancers compared with normal-weight subjects, and it has been estimated that between 15 and 45% of these cancers can be attributed to overweight. More recent studies suggest that obesity may also increase the risk of other types of cancer, including pancreatic, hepatic and gallbladder cancer. Ref: Obesity and cancer. Pischon T, Nthlings U, [employer] H. Proc Nutr Soc. 2008 May;67(2):128-45. doi: 73.4193/[id number].)  Recommendation: At this point it is urgent that you take a step back and concentrate in loosing weight. Because most chronic pain patients do have difficulty exercising secondary to their pain, you must rely on proper nutrition and dieting in order to lose the weight. If your BMI is above 40, you should seriously consider bariatric surgery. A realistic goal is to lose 10% of your body weight over a period of 12 months.  If over time you have unsuccessfully try to lose weight, then it is time for you to seek professional help and to enter a medically supervised weight management program.  Pain management considerations:  1. Pharmacological Problems: Be advised that the use of opioid analgesics has been associated with decreased metabolism and weight gain.  For this reason, should we see that you are unable to lose weight while taking these medications, it may become necessary for [place] to taper down and indefinitely discontinue these medicines.  2. Technical Problems: The incidence of successful interventional therapies decreases as the patient's BMI increases. It is much more difficult to accomplish a safe and effective interventional therapy on a patient with a BMI above 35. Yours is There is no height or weight on file to calculate BMI.. 3. Radiation Exposure Problems: The x-rays  machine, used to accomplish injection therapies, will automatically increase their x-ray output in order to capture an appropriate bone image. This means that radiation exposure increases exponentially with the patient's BMI. (The higher the BMI, the higher the radiation exposure.) Although the level of radiation used at a given time is still safe to the patient, it is not for the physician and/or assisting staff. Unfortunately, radiation exposure is accumulative. Because physicians and the staff have to do procedures and be exposed on a daily basis, this can result in health problems such as cancer and radiation burns. Radiation exposure to the staff is monitored by the radiation batches that they wear. The exposure levels are reported back to the staff on a quarterly basis. Depending on levels of exposure, physicians and staff may be obligated by law to decrease this exposure. This means that they have the right and obligation to refuse providing therapies where they may be overexposed to radiation. For this reason, physicians may decline to offer therapies such as radiofrequency ablation or implants to patients with a BMI above 40. ____________________________________________________________________________________________  ____________________________________________________________________________________________  Pain Prevention Technique  Definition:   A technique used to minimize the effects of an activity known to cause inflammation or swelling, which in turn leads to an increase in pain.  Purpose: To prevent swelling from occurring. It is based on the fact that it is easier to prevent swelling from happening than it is to get rid of it, once it  occurs.  Contraindications: 1. Anyone with allergy or hypersensitivity to the recommended medications. 2. Anyone taking anticoagulants (Blood Thinners) (e.g., Coumadin, Warfarin, Plavix, etc.). 3. Patients in Renal Failure.  Technique: Before you  undertake an activity known to cause pain, or a flare-up of your chronic pain, and before you experience any pain, do the following:  1. On a full stomach, take 4 (four) over the counter Ibuprofens 200mg  tablets (Motrin), for a total of 800 mg. 2. In addition, take over the counter Magnesium 400 to 500 mg, before doing the activity.  3. Six (6) hours later, again on a full stomach, repeat the Ibuprofen. 4. That night, take a warm shower and stretch under the running warm water.  This technique may be sufficient to abort the pain and discomfort before it happens. Keep in mind that it takes a lot less medication to prevent swelling than it takes to eliminate it once it occurs.  ____________________________________________________________________________________________   ____________________________________________________________________________________________  Preparing for your procedure (without sedation)  Instructions: . Oral Intake: Do not eat or drink anything for at least 3 hours prior to your procedure. . Transportation: Unless otherwise stated by your physician, you may drive yourself after the procedure. . Blood Pressure Medicine: Take your blood pressure medicine with a sip of water the morning of the procedure. . Blood thinners: Notify our staff if you are taking any blood thinners. Depending on which one you take, there will be specific instructions on how and when to stop it. . Diabetics on insulin: Notify the staff so that you can be scheduled 1st case in the morning. If your diabetes requires high dose insulin, take only  of your normal insulin dose the morning of the procedure and notify the staff that you have done so. . Preventing infections: Shower with an antibacterial soap the morning of your procedure.  . Build-up your immune system: Take 1000 mg of Vitamin C with every meal (3 times a day) the day prior to your procedure. Marland Kitchen Antibiotics: Inform the staff if you have a  condition or reason that requires you to take antibiotics before dental procedures. . Pregnancy: If you are pregnant, call and cancel the procedure. . Sickness: If you have a cold, fever, or any active infections, call and cancel the procedure. . Arrival: You must be in the facility at least 30 minutes prior to your scheduled procedure. . Children: Do not bring any children with you. . Dress appropriately: Bring dark clothing that you would not mind if they get stained. . Valuables: Do not bring any jewelry or valuables.  Procedure appointments are reserved for interventional treatments only. Marland Kitchen No Prescription Refills. . No medication changes will be discussed during procedure appointments. . No disability issues will be discussed.  Reasons to call and reschedule or cancel your procedure: (Following these recommendations will minimize the risk of a serious complication.) . Surgeries: Avoid having procedures within 2 weeks of any surgery. (Avoid for 2 weeks before or after any surgery). . Flu Shots: Avoid having procedures within 2 weeks of a flu shots or . (Avoid for 2 weeks before or after immunizations). . Barium: Avoid having a procedure within 7-10 days after having had a radiological study involving the use of radiological contrast. (Myelograms, Barium swallow or enema study). . Heart attacks: Avoid any elective procedures or surgeries for the initial 6 months after a "Myocardial Infarction" (Heart Attack). . Blood thinners: It is imperative that you stop these medications before procedures. Let us know if  you if you take any blood thinner.  . Infection: Avoid procedures during or within two weeks of an infection (including chest colds or gastrointestinal problems). Symptoms associated with infections include: Localized redness, fever, chills, night sweats or profuse sweating, burning sensation when voiding, cough, congestion, stuffiness, runny nose, sore throat, diarrhea, nausea, vomiting,  cold or Flu symptoms, recent or current infections. It is specially important if the infection is over the area that we intend to treat. Marland Kitchen Heart and lung problems: Symptoms that may suggest an active cardiopulmonary problem include: cough, chest pain, breathing difficulties or shortness of breath, dizziness, ankle swelling, uncontrolled high or unusually low blood pressure, and/or palpitations. If you are experiencing any of these symptoms, cancel your procedure and contact your primary care physician for an evaluation.  Remember:  Regular Business hours are:  Monday to Thursday 8:00 AM to 4:00 PM  Provider's Schedule: Milinda Pointer, MD:  Procedure days: Tuesday and Thursday 7:30 AM to 4:00 PM  Gillis Santa, MD:  Procedure days: Monday and Wednesday 7:30 AM to 4:00 PM ____________________________________________________________________________________________

## 2018-08-17 ENCOUNTER — Ambulatory Visit: Admission: RE | Admit: 2018-08-17 | Payer: Medicaid Other | Source: Ambulatory Visit

## 2018-08-17 ENCOUNTER — Telehealth: Payer: Self-pay | Admitting: Family Medicine

## 2018-08-17 ENCOUNTER — Ambulatory Visit: Payer: Medicaid Other

## 2018-08-17 DIAGNOSIS — R7303 Prediabetes: Secondary | ICD-10-CM

## 2018-08-17 LAB — SEDIMENTATION RATE: SED RATE: 111 mm/h — AB (ref 0–32)

## 2018-08-17 LAB — C-REACTIVE PROTEIN: CRP: 46 mg/L — ABNORMAL HIGH (ref 0–10)

## 2018-08-17 MED ORDER — METFORMIN HCL ER 750 MG PO TB24: 1500.0000 mg | ORAL_TABLET | Freq: Every day | ORAL | 1 refills | Status: DC

## 2018-08-17 NOTE — Telephone Encounter (Signed)
Received message that [facility] is out of Metformin 750XR.  Could you please ask the patient if there is an alternative pharmacy that she could use for metformin and que up a refill to that pharmacy.

## 2018-08-17 NOTE — Telephone Encounter (Signed)
Patient states we could send it into [facility].

## 2018-08-29 ENCOUNTER — Telehealth: Payer: Self-pay | Admitting: *Deleted

## 2018-08-29 ENCOUNTER — Ambulatory Visit: Payer: Medicaid Other | Admitting: General Surgery

## 2018-08-29 NOTE — Telephone Encounter (Signed)
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

## 2018-08-30 ENCOUNTER — Other Ambulatory Visit: Payer: Self-pay

## 2018-08-30 ENCOUNTER — Encounter: Payer: Self-pay | Admitting: Pain Medicine

## 2018-08-30 ENCOUNTER — Ambulatory Visit: Payer: Medicaid Other | Attending: Pain Medicine | Admitting: Pain Medicine

## 2018-08-30 VITALS — BP 120/63 | HR 97 | Temp 99.2°F | Ht 64.0 in | Wt 346.0 lb

## 2018-08-30 DIAGNOSIS — I471 Supraventricular tachycardia: Secondary | ICD-10-CM | POA: Insufficient documentation

## 2018-08-30 DIAGNOSIS — Z791 Long term (current) use of non-steroidal anti-inflammatories (NSAID): Secondary | ICD-10-CM | POA: Insufficient documentation

## 2018-08-30 DIAGNOSIS — D509 Iron deficiency anemia, unspecified: Secondary | ICD-10-CM | POA: Insufficient documentation

## 2018-08-30 DIAGNOSIS — M79672 Pain in left foot: Secondary | ICD-10-CM | POA: Diagnosis not present

## 2018-08-30 DIAGNOSIS — M545 Low back pain: Secondary | ICD-10-CM | POA: Diagnosis not present

## 2018-08-30 DIAGNOSIS — M25531 Pain in right wrist: Secondary | ICD-10-CM | POA: Insufficient documentation

## 2018-08-30 DIAGNOSIS — M533 Sacrococcygeal disorders, not elsewhere classified: Secondary | ICD-10-CM | POA: Insufficient documentation

## 2018-08-30 DIAGNOSIS — Z8249 Family history of ischemic heart disease and other diseases of the circulatory system: Secondary | ICD-10-CM | POA: Insufficient documentation

## 2018-08-30 DIAGNOSIS — Z7984 Long term (current) use of oral hypoglycemic drugs: Secondary | ICD-10-CM | POA: Insufficient documentation

## 2018-08-30 DIAGNOSIS — G894 Chronic pain syndrome: Secondary | ICD-10-CM | POA: Diagnosis not present

## 2018-08-30 DIAGNOSIS — M25561 Pain in right knee: Secondary | ICD-10-CM

## 2018-08-30 DIAGNOSIS — M25512 Pain in left shoulder: Secondary | ICD-10-CM

## 2018-08-30 DIAGNOSIS — K219 Gastro-esophageal reflux disease without esophagitis: Secondary | ICD-10-CM | POA: Insufficient documentation

## 2018-08-30 DIAGNOSIS — M542 Cervicalgia: Secondary | ICD-10-CM | POA: Insufficient documentation

## 2018-08-30 DIAGNOSIS — M25532 Pain in left wrist: Secondary | ICD-10-CM | POA: Insufficient documentation

## 2018-08-30 DIAGNOSIS — M25562 Pain in left knee: Secondary | ICD-10-CM

## 2018-08-30 DIAGNOSIS — G8929 Other chronic pain: Secondary | ICD-10-CM

## 2018-08-30 DIAGNOSIS — Z5181 Encounter for therapeutic drug level monitoring: Secondary | ICD-10-CM | POA: Diagnosis not present

## 2018-08-30 DIAGNOSIS — M47896 Other spondylosis, lumbar region: Secondary | ICD-10-CM | POA: Diagnosis not present

## 2018-08-30 DIAGNOSIS — M170 Bilateral primary osteoarthritis of knee: Secondary | ICD-10-CM | POA: Diagnosis not present

## 2018-08-30 DIAGNOSIS — R51 Headache: Secondary | ICD-10-CM | POA: Insufficient documentation

## 2018-08-30 DIAGNOSIS — M79631 Pain in right forearm: Secondary | ICD-10-CM | POA: Diagnosis not present

## 2018-08-30 DIAGNOSIS — Z833 Family history of diabetes mellitus: Secondary | ICD-10-CM | POA: Insufficient documentation

## 2018-08-30 DIAGNOSIS — M797 Fibromyalgia: Secondary | ICD-10-CM | POA: Diagnosis not present

## 2018-08-30 DIAGNOSIS — Z79899 Other long term (current) drug therapy: Secondary | ICD-10-CM | POA: Insufficient documentation

## 2018-08-30 DIAGNOSIS — Z86718 Personal history of other venous thrombosis and embolism: Secondary | ICD-10-CM | POA: Insufficient documentation

## 2018-08-30 DIAGNOSIS — I10 Essential (primary) hypertension: Secondary | ICD-10-CM | POA: Insufficient documentation

## 2018-08-30 DIAGNOSIS — D1722 Benign lipomatous neoplasm of skin and subcutaneous tissue of left arm: Secondary | ICD-10-CM | POA: Insufficient documentation

## 2018-08-30 DIAGNOSIS — R7303 Prediabetes: Secondary | ICD-10-CM | POA: Diagnosis not present

## 2018-08-30 DIAGNOSIS — Z6841 Body Mass Index (BMI) 40.0 and over, adult: Secondary | ICD-10-CM | POA: Insufficient documentation

## 2018-08-30 DIAGNOSIS — M25511 Pain in right shoulder: Secondary | ICD-10-CM

## 2018-08-30 DIAGNOSIS — Z79891 Long term (current) use of opiate analgesic: Secondary | ICD-10-CM | POA: Diagnosis not present

## 2018-08-30 DIAGNOSIS — J45909 Unspecified asthma, uncomplicated: Secondary | ICD-10-CM | POA: Insufficient documentation

## 2018-08-30 DIAGNOSIS — M79632 Pain in left forearm: Secondary | ICD-10-CM | POA: Diagnosis not present

## 2018-08-30 DIAGNOSIS — F329 Major depressive disorder, single episode, unspecified: Secondary | ICD-10-CM | POA: Insufficient documentation

## 2018-08-30 DIAGNOSIS — M19011 Primary osteoarthritis, right shoulder: Secondary | ICD-10-CM

## 2018-08-30 DIAGNOSIS — Z7982 Long term (current) use of aspirin: Secondary | ICD-10-CM | POA: Insufficient documentation

## 2018-08-30 DIAGNOSIS — R079 Chest pain, unspecified: Secondary | ICD-10-CM | POA: Diagnosis not present

## 2018-08-30 DIAGNOSIS — M19012 Primary osteoarthritis, left shoulder: Secondary | ICD-10-CM | POA: Insufficient documentation

## 2018-08-30 DIAGNOSIS — M79642 Pain in left hand: Secondary | ICD-10-CM | POA: Insufficient documentation

## 2018-08-30 DIAGNOSIS — F4310 Post-traumatic stress disorder, unspecified: Secondary | ICD-10-CM | POA: Insufficient documentation

## 2018-08-30 NOTE — Progress Notes (Signed)
Safety precautions to be maintained throughout the outpatient stay will include: orient to surroundings, keep bed in low position, maintain call bell within reach at all times, provide assistance with transfer out of bed and ambulation.  
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
## 2018-08-30 NOTE — Patient Instructions (Signed)
____________________________________________________________________________________________  Preparing for Procedure with Sedation  Instructions: . Oral Intake: Do not eat or drink anything for at least 8 hours prior to your procedure. . Transportation: Public transportation is not allowed. Bring an adult driver. The driver must be physically present in our waiting room before any procedure can be started. . Physical Assistance: Bring an adult physically capable of assisting you, in the event you need help. This adult should keep you company at home for at least 6 hours after the procedure. . Blood Pressure Medicine: Take your blood pressure medicine with a sip of water the morning of the procedure. . Blood thinners: Notify our staff if you are taking any blood thinners. Depending on which one you take, there will be specific instructions on how and when to stop it. . Diabetics on insulin: Notify the staff so that you can be scheduled 1st case in the morning. If your diabetes requires high dose insulin, take only  of your normal insulin dose the morning of the procedure and notify the staff that you have done so. . Preventing infections: Shower with an antibacterial soap the morning of your procedure. . Build-up your immune system: Take 1000 mg of Vitamin C with every meal (3 times a day) the day prior to your procedure. . Antibiotics: Inform the staff if you have a condition or reason that requires you to take antibiotics before dental procedures. . Pregnancy: If you are pregnant, call and cancel the procedure. . Sickness: If you have a cold, fever, or any active infections, call and cancel the procedure. . Arrival: You must be in the facility at least 30 minutes prior to your scheduled procedure. . Children: Do not bring children with you. . Dress appropriately: Bring dark clothing that you would not mind if they get stained. . Valuables: Do not bring any jewelry or valuables.  Procedure  appointments are reserved for interventional treatments only. . No Prescription Refills. . No medication changes will be discussed during procedure appointments. . No disability issues will be discussed.  Reasons to call and reschedule or cancel your procedure: (Following these recommendations will minimize the risk of a serious complication.) . Surgeries: Avoid having procedures within 2 weeks of any surgery. (Avoid for 2 weeks before or after any surgery). . Flu Shots: Avoid having procedures within 2 weeks of a flu shots or . (Avoid for 2 weeks before or after immunizations). . Barium: Avoid having a procedure within 7-10 days after having had a radiological study involving the use of radiological contrast. (Myelograms, Barium swallow or enema study). . Heart attacks: Avoid any elective procedures or surgeries for the initial 6 months after a "Myocardial Infarction" (Heart Attack). . Blood thinners: It is imperative that you stop these medications before procedures. Let us know if you if you take any blood thinner.  . Infection: Avoid procedures during or within two weeks of an infection (including chest colds or gastrointestinal problems). Symptoms associated with infections include: Localized redness, fever, chills, night sweats or profuse sweating, burning sensation when voiding, cough, congestion, stuffiness, runny nose, sore throat, diarrhea, nausea, vomiting, cold or Flu symptoms, recent or current infections. It is specially important if the infection is over the area that we intend to treat. . Heart and lung problems: Symptoms that may suggest an active cardiopulmonary problem include: cough, chest pain, breathing difficulties or shortness of breath, dizziness, ankle swelling, uncontrolled high or unusually low blood pressure, and/or palpitations. If you are experiencing any of these symptoms, cancel   your procedure and contact your primary care physician for an evaluation.  Remember:   Regular Business hours are:  Monday to Thursday 8:00 AM to 4:00 PM  Provider's Schedule: Carisma Troupe, MD:  Procedure days: Tuesday and Thursday 7:30 AM to 4:00 PM  Bilal Lateef, MD:  Procedure days: Monday and Wednesday 7:30 AM to 4:00 PM ____________________________________________________________________________________________    

## 2018-09-07 ENCOUNTER — Ambulatory Visit: Payer: Medicaid Other

## 2018-09-10 ENCOUNTER — Encounter: Payer: Self-pay | Admitting: *Deleted

## 2018-09-10 ENCOUNTER — Other Ambulatory Visit: Payer: Self-pay

## 2018-09-10 ENCOUNTER — Encounter: Payer: Self-pay | Admitting: General Surgery

## 2018-09-10 ENCOUNTER — Ambulatory Visit (INDEPENDENT_AMBULATORY_CARE_PROVIDER_SITE_OTHER): Payer: Medicaid Other | Admitting: General Surgery

## 2018-09-10 VITALS — BP 153/88 | HR 94 | Temp 97.5°F | Resp 18 | Ht 64.0 in | Wt 345.6 lb

## 2018-09-10 DIAGNOSIS — D170 Benign lipomatous neoplasm of skin and subcutaneous tissue of head, face and neck: Secondary | ICD-10-CM

## 2018-09-10 DIAGNOSIS — R221 Localized swelling, mass and lump, neck: Secondary | ICD-10-CM | POA: Diagnosis not present

## 2018-09-10 NOTE — Progress Notes (Signed)
Referral will be made to [facility] Surgery Department per Dr. Glenford Peers request.   Their office was contacted today and due to the location of the diagnosis, our office needs to fax records so their MD can review to see if they are willing to see patient. Their office will be in contact with patient once this has been done.   Patient aware of the above.   Note to Dr. Celine Ahr to complete office note from today so we can get records faxed.

## 2018-09-10 NOTE — Patient Instructions (Signed)
WE will refer you to General surgeon at [facility]. Please call our office if you have questions or concerns.

## 2018-09-10 NOTE — Progress Notes (Signed)
Lauren [name] is here today to discuss possible surgery for a soft tissue area on her neck.  I last saw her on 2 December.  At that time there was no grossly evident mass.  I performed an ultrasound that was somewhat technically limited by her body habitus. I felt that possibly there may be a small lipoma in the area.  At that visit, I discussed with her the possibility of operative intervention, however I could not really ascribe her reported symptoms to this area.  I cautioned her that surgical removal may not improve anything.  My initial consult note is partially copied here:  "Lauren [name] is a 42 y.o. female.   She was referred by Raelyn Ensign, nurse practitioner, for evaluation of a soft tissue mass on her neck.  She states that it has been present for about 3 months.  She says that it has slowly gotten bigger over that time.  It has never decreased in size.  She says it hurts if she turns her neck or moves her arm.  There has never been any erythema fluctuance or drainage from the area.  She denies any history of similar masses anywhere else on her body.  She was placed on a 10-day course of Augmentin without any change.  She says that her temperature was up to 100.3 last week.  She has had no other fevers or chills associated with this mass.  Of note, she is super morbidly obese, and has recently been evaluated for possible bariatric surgery.  She is also wearing a cardiac monitor today for evaluation of supraventricular tachycardia.  She says that her brother recently had a lipoma removed from a similar area on his neck."  Today she reports ongoing pain in the area.  She says it hurts just sitting still.  She is unable to move her left arm without pain.    Past Medical History:  Diagnosis Date  . Advanced maternal age in multigravida   . Asthma   . Depression   . GERD (gastroesophageal reflux disease)   . Dozier multiparity   . Headache   . History of carpal tunnel surgery of left wrist  05/2018  . Hypertension   . Leg fracture   . Obesity, morbid, BMI 40.0-49.9 ([place])   . Post traumatic stress disorder   . Pre-eclampsia   . SVT (supraventricular tachycardia) ([place])    a. prior report of SVT req adenosine;  b. 2011 Holter @ [facility] reportedly showed "irregular heartbeat"; c. 05/2016 Holter: "basically unremarkable" per Dr. Clayborn Bigness;  d. 07/2016 Echo: EF 50-55%. Nl RV fxn.  . Wrist fracture, closed    bilateral   Past Surgical History:  Procedure Laterality Date  . CARDIAC ELECTROPHYSIOLOGY STUDY AND ABLATION  01/18/2018  . CESAREAN SECTION  2010  . CESAREAN SECTION  2011  . CHOLECYSTECTOMY    . KNEE SURGERY    . ROTATOR CUFF REPAIR    . TONSILLECTOMY     Family History  Problem Relation Age of Onset  . Diabetes Sister   . Migraines Maternal Aunt   . Throat cancer Maternal Aunt   . Migraines Maternal Uncle   . Migraines Paternal Aunt   . Migraines Paternal Uncle   . Diabetes Sister   . Stomach cancer Sister   . Diabetes Sister   . Diabetes Sister   . Diabetes Sister   . Diabetes Sister   . Ovarian cancer Cousin   . Diabetes Mother   . Diabetes Father   .  Migraines Father   . Hypertension Brother   . Coronary artery disease Brother   . Hypertension Brother   . Diabetes Brother   . Breast cancer Neg Hx    Social History   Tobacco Use  . Smoking status: Never Smoker  . Smokeless tobacco: Never Used  Substance Use Topics  . Alcohol use: No    Comment: occ.  . Drug use: No    Comment: last used several months ago   Current Meds  Medication Sig  . acetaminophen (TYLENOL) 325 MG tablet Take 650 mg by mouth every 6 (six) hours as needed.  Marland Kitchen albuterol (PROVENTIL HFA;VENTOLIN HFA) 108 (90 Base) MCG/ACT inhaler Inhale 2 puffs into the lungs every 6 (six) hours as needed for wheezing or shortness of breath.  Marland Kitchen aspirin EC 81 MG tablet Take 81 mg by mouth daily.  . beclomethasone (QVAR REDIHALER) 80 MCG/ACT inhaler Inhale 1 puff into the lungs 2 (two) times  daily.  Marland Kitchen diltiazem (CARDIZEM CD) 180 MG 24 hr capsule Take 1 capsule by mouth daily.  Marland Kitchen esomeprazole (NEXIUM) 40 MG capsule Take 1 capsule (40 mg total) by mouth daily at 12 noon.  . fluticasone (FLOVENT HFA) 220 MCG/ACT inhaler Inhale 2 puffs into the lungs 2 (two) times daily.  . furosemide (LASIX) 40 MG tablet Take 40 mg by mouth daily.  Marland Kitchen gabapentin (NEURONTIN) 300 MG capsule Take 1 capsule (300 mg total) by mouth 3 (three) times daily.  Marland Kitchen HYDROcodone-acetaminophen (NORCO) 5-325 MG tablet Take 1 tablet by mouth every 8 (eight) hours as needed for moderate pain.  Marland Kitchen ibuprofen (ADVIL,MOTRIN) 600 MG tablet Take 1 tablet (600 mg total) by mouth every 8 (eight) hours as needed.  Marland Kitchen losartan-hydrochlorothiazide (HYZAAR) 50-12.5 MG tablet Take 1 tablet by mouth daily.  . metFORMIN (GLUCOPHAGE XR) 750 MG 24 hr tablet Take 2 tablets (1,500 mg total) by mouth daily with breakfast.  . metoprolol succinate (TOPROL-XL) 100 MG 24 hr tablet Take 100 mg by mouth daily. Take with or immediately following a meal.  . STIOLTO RESPIMAT 2.5-2.5 MCG/ACT AERS Inhale 2 puffs into the lungs daily.  Marland Kitchen tiZANidine (ZANAFLEX) 4 MG tablet Take 1 tablet (4 mg total) by mouth every 6 (six) hours as needed for muscle spasms.   Allergies  Allergen Reactions  . Nucynta [Tapentadol] Hives   Vitals:   09/10/18 1453  BP: (!) 153/88  Pulse: 94  Resp: 18  Temp: (!) 97.5 F (36.4 C)  SpO2: 99%   Focused examination of the patient's neck: She is super morbidly obese.  There is questionable asymmetry between the left and right sides of her neck at the level of the sternocleidomastoid insertion.  I do not appreciate a palpable mass or other abnormality in the area.  All I am able to feel is fatty tissue.  This is present bilaterally however it may be somewhat more dominant on the left compared to the right.  A/P: This is a 42 year old super morbidly obese female complaining of pain in her left neck and shoulder.  She believes it  is due to a mass in this area.  On my initial evaluation at the beginning of December, I thought that perhaps there might be a small lipoma however I was not entirely convinced of this.  I am even less convinced today based on my exam.  I told her that I could certainly operate on her and explore the area, however I could not guarantee that anything would be found nor  that surgery would improve her symptoms.  I am concerned that general anesthesia in someone with her degree of obesity is risky in light of the limited or even non-existent possibility of improvement.  She was somewhat dissatisfied with this response and requested a second opinion.  I offered to either let her see somebody in our office or gave her the option of a referral to the [facility].  She prefers to be seen at [facility] and we have made an appropriate referral.  No follow-up with me is made at this time

## 2018-09-11 ENCOUNTER — Ambulatory Visit: Payer: Medicaid Other

## 2018-09-13 ENCOUNTER — Encounter: Payer: Self-pay | Admitting: *Deleted

## 2018-09-13 ENCOUNTER — Other Ambulatory Visit: Payer: Self-pay

## 2018-09-13 ENCOUNTER — Encounter: Payer: Self-pay | Admitting: Nurse Practitioner

## 2018-09-13 ENCOUNTER — Ambulatory Visit: Payer: Medicaid Other | Attending: Nurse Practitioner | Admitting: Nurse Practitioner

## 2018-09-13 VITALS — BP 108/97 | HR 84 | Temp 98.3°F | Resp 18 | Ht 64.0 in | Wt 345.0 lb

## 2018-09-13 DIAGNOSIS — M25532 Pain in left wrist: Secondary | ICD-10-CM | POA: Diagnosis not present

## 2018-09-13 DIAGNOSIS — M25561 Pain in right knee: Secondary | ICD-10-CM | POA: Diagnosis present

## 2018-09-13 DIAGNOSIS — M792 Neuralgia and neuritis, unspecified: Secondary | ICD-10-CM | POA: Diagnosis not present

## 2018-09-13 DIAGNOSIS — M47896 Other spondylosis, lumbar region: Secondary | ICD-10-CM | POA: Insufficient documentation

## 2018-09-13 DIAGNOSIS — I10 Essential (primary) hypertension: Secondary | ICD-10-CM | POA: Insufficient documentation

## 2018-09-13 DIAGNOSIS — M5136 Other intervertebral disc degeneration, lumbar region: Secondary | ICD-10-CM | POA: Insufficient documentation

## 2018-09-13 DIAGNOSIS — G894 Chronic pain syndrome: Secondary | ICD-10-CM | POA: Diagnosis not present

## 2018-09-13 DIAGNOSIS — Z6841 Body Mass Index (BMI) 40.0 and over, adult: Secondary | ICD-10-CM | POA: Diagnosis not present

## 2018-09-13 DIAGNOSIS — R7303 Prediabetes: Secondary | ICD-10-CM | POA: Diagnosis not present

## 2018-09-13 DIAGNOSIS — Z7984 Long term (current) use of oral hypoglycemic drugs: Secondary | ICD-10-CM | POA: Insufficient documentation

## 2018-09-13 DIAGNOSIS — M170 Bilateral primary osteoarthritis of knee: Secondary | ICD-10-CM

## 2018-09-13 DIAGNOSIS — M797 Fibromyalgia: Secondary | ICD-10-CM | POA: Insufficient documentation

## 2018-09-13 DIAGNOSIS — M47816 Spondylosis without myelopathy or radiculopathy, lumbar region: Secondary | ICD-10-CM

## 2018-09-13 DIAGNOSIS — M25511 Pain in right shoulder: Secondary | ICD-10-CM | POA: Insufficient documentation

## 2018-09-13 DIAGNOSIS — M542 Cervicalgia: Secondary | ICD-10-CM | POA: Insufficient documentation

## 2018-09-13 DIAGNOSIS — Z7982 Long term (current) use of aspirin: Secondary | ICD-10-CM | POA: Insufficient documentation

## 2018-09-13 DIAGNOSIS — Z86718 Personal history of other venous thrombosis and embolism: Secondary | ICD-10-CM | POA: Diagnosis not present

## 2018-09-13 DIAGNOSIS — M25512 Pain in left shoulder: Secondary | ICD-10-CM | POA: Insufficient documentation

## 2018-09-13 DIAGNOSIS — Z79899 Other long term (current) drug therapy: Secondary | ICD-10-CM | POA: Diagnosis not present

## 2018-09-13 DIAGNOSIS — Z79891 Long term (current) use of opiate analgesic: Secondary | ICD-10-CM

## 2018-09-13 DIAGNOSIS — G8929 Other chronic pain: Secondary | ICD-10-CM

## 2018-09-13 MED ORDER — HYDROCODONE-ACETAMINOPHEN 5-325 MG PO TABS: 1.0000 | ORAL_TABLET | Freq: Three times a day (TID) | ORAL | 0 refills | Status: DC | PRN

## 2018-09-13 NOTE — Progress Notes (Signed)
Per [facility] Surgery Department, they did receive referral and have contacted patient regarding an appointment. She is scheduled for an appointment next Wednesday, 09-19-18.

## 2018-09-13 NOTE — Progress Notes (Signed)
Patient's Name: Lauren [name]  MRN: [id number]  Referring Provider: Hubbard Hartshorn, FNP  DOB: 1975-10-16  PCP: Hubbard Hartshorn, FNP  DOS: 09/13/2018  Note by: Vevelyn Francois NP  Service setting: Ambulatory outpatient  Specialty: Interventional Pain Management  Location: [facility] (AMB) Pain Management Facility    Patient type: Established    Primary Reason(s) for Visit: Encounter for prescription drug management. (Level of risk: moderate)  CC: Knee Pain (bilateral); Wrist Pain (left); Neck Pain (left radiating to shoulder); and Back Pain (lower)  HPI  Lauren [name] is a 43 y.o. year old, female patient, who comes today for a medication management evaluation. She has Morbid obesity ([place]); Depression; Chest pain; SVT (supraventricular tachycardia) ([place]); Chronic knee pain (Primary Area of Pain) (Bilateral) (L>R); Chronic daily headache; Chronic pain in right shoulder; Chronic generalized pain; Chronic, continuous use of opioids; HTN (hypertension), benign; Fibromyalgia; Gastro-esophageal reflux disease without esophagitis; Heart palpitations; History of maternal deep vein thrombosis (DVT); Hx of abnormal cervical Pap smear; PTSD (post-traumatic stress disorder); Snoring; Stress incontinence; Transaminitis; Chronic wrist pain (Secondary Area of Pain) (Left); Chronic shoulder pain (Fourth Area of Pain) (Bilateral) (L>R); 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 (Bilateral); Chronic pain of left upper extremity (R>L); Chronic neck pain (Tertiary Area of Pain) (Bilateral) (L>R); Chronic lower extremity pain (Bilateral); Chronic sacroiliac joint pain; Chronic pain syndrome; Opiate use; Pharmacologic therapy; Disorder of skeletal system; Problems influencing health status; Elevated C-reactive protein (CRP); Elevated sed rate; Chronic musculoskeletal pain; Osteoarthritis of knee (Bilateral); Cervicalgia; Chronic low back pain (Fifth Area of Pain) (Bilateral) (R>L) w/o sciatica; DDD (degenerative disc  disease), lumbar; Morbid obesity with BMI of 50.0-59.9, adult ([place]); Neurogenic pain; Impaired ambulation; Asthma; Lymphadenopathy, cervical; Thrombocytosis ([place]); Leukocytosis; Anemia; Prediabetes; Iron deficiency anemia; Lumbar spondylosis; Chronic knee pain (Primary Area of Pain) (Left); Lipoma of left shoulder; Right forearm pain; and Osteoarthritis of shoulder (Bilateral) on their problem list. Her primarily concern today is the Knee Pain (bilateral); Wrist Pain (left); Neck Pain (left radiating to shoulder); and Back Pain (lower)  Pain Assessment: Location: Left Neck Radiating: left shoulder, knees, lowback, left wrist Onset: More than a month ago Duration: Chronic pain Quality: Constant(pulling sensation) Severity: 3 /10 (subjective, self-reported pain score)  Note: Reported level is compatible with observation.                          Timing: Constant Modifying factors: medications, heat, topicals BP: (!) 108/97  HR: 84  Lauren [name] was last scheduled for an appointment on 08/13/2018 for medication management. During today's appointment we reviewed Lauren [name]'s chronic pain status, as well as her outpatient medication regimen. She states that she is going to have surgery on her hematoma on the left clavicle/shoulder area. She admits that she has problems turning her neck. She is unable to sleep on her left side due to the pain. She is scheduled for an She is going to have [facility] now. She is going to have the gastric sleeve verse the gastric bypass. She admits that she has increased dizziness and goes to sleep after the use of the Tizanidine,. She does only use this at night prn.   The patient  reports no history of drug use. Her body mass index is 59.22 kg/m.  Further details on both, my assessment(s), as well as the proposed treatment plan, please see below.  Controlled Substance Pharmacotherapy Assessment REMS (Risk Evaluation and Mitigation Strategy)  Analgesic:  Hydrocodone/acetaminophen 5/325  1 tablet 3 times daily MME/day: 15 mg/day.   Lauren Rochester, RN  09/13/2018  8:27 AM  Sign when Signing Visit Nursing Pain Medication Assessment:  Safety precautions to be maintained throughout the outpatient stay will include: orient to surroundings, keep bed in low position, maintain call bell within reach at all times, provide assistance with transfer out of bed and ambulation.  Medication Inspection Compliance: Pill count conducted under aseptic conditions, in front of the patient. Neither the pills nor the bottle was removed from the patient's sight at any time. Once count was completed pills were immediately returned to the patient in their original bottle.  Medication: Hydrocodone/APAP Pill/Patch Count: 2 of 90 pills remain Pill/Patch Appearance: Markings consistent with prescribed medication Bottle Appearance: Standard pharmacy container. Clearly labeled. Filled Date: 2 / 02 / 2019 Last Medication intake:  Yesterday   Pharmacokinetics: Liberation and absorption (onset of action): WNL Distribution (time to peak effect): WNL Metabolism and excretion (duration of action): WNL         Pharmacodynamics: Desired effects: Analgesia: [name] reports >50% benefit. Functional ability: Patient reports that medication allows her to accomplish basic ADLs Clinically meaningful improvement in function (CMIF): Sustained CMIF goals met Perceived effectiveness: Described as relatively effective, allowing for increase in activities of daily living (ADL) Undesirable effects: Side-effects or Adverse reactions: None reported Monitoring: [place] PMP: Online review of the past 31-monthperiod conducted. Compliant with practice rules and regulations Last UDS on record: Summary  Date Value Ref Range Status  03/14/2018 FINAL  Final    Comment:    ==================================================================== TOXAS[facility]RE COMP DRUG  ANALYSIS,UR ==================================================================== Test                             Result       Flag       Units Drug Present and Declared for Prescription Verification   Acetaminophen                  PRESENT      EXPECTED   Ibuprofen                      PRESENT      EXPECTED   Naproxen                       PRESENT      EXPECTED   Diltiazem                      PRESENT      EXPECTED Drug Absent but Declared for Prescription Verification   Topiramate                     Not Detected UNEXPECTED   Tizanidine                     Not Detected UNEXPECTED    Tizanidine, as indicated in the declared medication list, is not    always detected even when used as directed.   Salicylate                     Not Detected UNEXPECTED    Aspirin, as indicated in the declared medication list, is not    always detected even when used as directed. ==================================================================== Test  Result    Flag   Units      Ref Range   Creatinine              402              mg/dL      >=20 ==================================================================== Declared Medications:  The flagging and interpretation on this report are based on the  following declared medications.  Unexpected results may arise from  inaccuracies in the declared medications.  **Note: The testing scope of this panel includes these medications:  Diltiazem  Naproxen  Topiramate  **Note: The testing scope of this panel does not include small to  moderate amounts of these reported medications:  Acetaminophen  Aspirin (Aspirin 81)  Ibuprofen  Tizanidine  **Note: The testing scope of this panel does not include following  reported medications:  Esomeprazole  Magnesium Oxide  Multivitamin (Prenatal Vitamin)  Vitamin D3 ==================================================================== For clinical consultation, please call [phone]. ====================================================================    UDS interpretation: Compliant          Medication Assessment Form: Reviewed. Patient indicates being compliant with therapy Treatment compliance: Compliant Risk Assessment Profile: Aberrant behavior: See prior evaluations. None observed or detected today Comorbid factors increasing risk of overdose: See prior notes. No additional risks detected today Opioid risk tool (ORT) (Total Score): 1 Personal History of Substance Abuse (SUD-Substance use disorder):  Alcohol: Negative  Illegal Drugs: Negative  Rx Drugs: Negative  ORT Risk Level calculation: Low Risk Risk of substance use disorder (SUD): Low Opioid Risk Tool - 09/13/18 0826      Family History of Substance Abuse   Alcohol  Negative    Illegal Drugs  Negative    Rx Drugs  Negative      Personal History of Substance Abuse   Alcohol  Negative    Illegal Drugs  Negative    Rx Drugs  Negative      Age   Age between 82-45 years   Yes      History of Preadolescent Sexual Abuse   History of Preadolescent Sexual Abuse  Negative or Female      Psychological Disease   Psychological Disease  Negative    Depression  Negative      Total Score   Opioid Risk Tool Scoring  1    Opioid Risk Interpretation  Low Risk      ORT Scoring interpretation table:  Score <3 = Low Risk for SUD  Score between 4-7 = Moderate Risk for SUD  Score >8 = High Risk for Opioid Abuse   Risk Mitigation Strategies:  Patient Counseling: Covered Patient-Prescriber Agreement (PPA): Present and active  Notification to other healthcare providers: Done  Pharmacologic Plan: No change in therapy, at this time.             Laboratory Chemistry  Inflammation Markers (CRP: Acute Phase) (ESR: Chronic Phase) Lab Results  Component Value Date   CRP 46 (H) 08/16/2018   ESRSEDRATE 111 (H) 08/16/2018                         Rheumatology Markers Lab Results  Component Value  Date   LABURIC 4.3 09/05/2017                        Renal Function Markers Lab Results  Component Value Date   BUN 9 08/02/2018   CREATININE 0.78 36/14/4315   BCR NOT APPLICABLE 40/04/6760  GFRAA 110 08/02/2018   GFRNONAA 95 08/02/2018                             Hepatic Function Markers Lab Results  Component Value Date   AST 21 07/12/2018   ALT 25 07/12/2018   ALBUMIN 3.9 06/25/2018   ALKPHOS 58 06/25/2018   LIPASE 27 09/05/2017                        Electrolytes Lab Results  Component Value Date   NA 139 08/02/2018   K 4.0 08/02/2018   CL 103 08/02/2018   CALCIUM 9.6 08/02/2018   MG 1.7 08/02/2018                        Neuropathy Markers Lab Results  Component Value Date   XFGHWEXH37 169 03/14/2018   HGBA1C 6.3 (H) 05/10/2018                        CNS Tests No results found for: COLORCSF, APPEARCSF, RBCCOUNTCSF, WBCCSF, POLYSCSF, LYMPHSCSF, EOSCSF, PROTEINCSF, GLUCCSF, JCVIRUS, CSFOLI, IGGCSF                      Bone Pathology Markers Lab Results  Component Value Date   25OHVITD1 32 03/14/2018   25OHVITD2 <1.0 03/14/2018   25OHVITD3 32 03/14/2018                         Coagulation Parameters Lab Results  Component Value Date   PLT 459 (H) 07/19/2018                        Cardiovascular Markers Lab Results  Component Value Date   BNP 32.0 06/25/2018   CKTOTAL 119 01/20/2012   CKMB < 0.5 (L) 01/20/2012   TROPONINI <0.03 06/25/2018   HGB 9.8 (L) 07/19/2018   HCT 33.1 (L) 07/19/2018                         CA Markers No results found for: CEA, CA125, LABCA2                      Note: Lab results reviewed.  Recent Diagnostic Imaging Results  US SOFT TISSUE HEAD & NECK (NON-THYROID) Ultrasound Head/Neck  Date of Exam: 12.2.19  Indication(s):  Soft tissue mass of neck  Prior Studies/Comparison:  none  Using the GE Logiq ultrasound machine and a 12 MHz linear array  transducer, dedicated imaging of the head and neck of the  patient was  performed.  Findings:  Right Lobe:  Normal size, no nodules seen  Isthmus:  Thickened without discrete nodule   Left Lobe:  Normal size, no nodules seen  Lymph Nodes:  No cervical LAD  Other:  In the area of patient concern, there is no distinct mass clearly  appreciated.  There is perhaps an area of increased density, without  increased vascular flow or concerning features.  Impression:  Essentially normal thyroid, no lymphadenopathy.  No discrete  mass identified in area of patient concern.  Complexity Note: Imaging results reviewed. Results shared with Ms. Kronberg, using Layman's terms.  Meds   Current Outpatient Medications:  .  acetaminophen (TYLENOL) 325 MG tablet, Take 650 mg by mouth every 6 (six) hours as needed., Disp: , Rfl:  .  albuterol (PROVENTIL HFA;VENTOLIN HFA) 108 (90 Base) MCG/ACT inhaler, Inhale 2 puffs into the lungs every 6 (six) hours as needed for wheezing or shortness of breath., Disp: 1 Inhaler, Rfl: 0 .  aspirin EC 81 MG tablet, Take 81 mg by mouth daily., Disp: , Rfl:  .  beclomethasone (QVAR REDIHALER) 80 MCG/ACT inhaler, Inhale 1 puff into the lungs 2 (two) times daily., Disp: 1 Inhaler, Rfl: 2 .  diltiazem (CARDIZEM CD) 180 MG 24 hr capsule, Take 1 capsule by mouth daily., Disp: , Rfl:  .  esomeprazole (NEXIUM) 40 MG capsule, Take 1 capsule (40 mg total) by mouth daily at 12 noon., Disp: 90 capsule, Rfl: 1 .  fluticasone (FLOVENT HFA) 220 MCG/ACT inhaler, Inhale 2 puffs into the lungs 2 (two) times daily., Disp: 1 Inhaler, Rfl: 2 .  furosemide (LASIX) 40 MG tablet, Take 40 mg by mouth daily., Disp: , Rfl: 5 .  gabapentin (NEURONTIN) 300 MG capsule, Take 1 capsule (300 mg total) by mouth 3 (three) times daily., Disp: 90 capsule, Rfl: 0 .  ibuprofen (ADVIL,MOTRIN) 600 MG tablet, Take 1 tablet (600 mg total) by mouth every 8 (eight) hours as needed., Disp: 30 tablet, Rfl: 0 .  losartan-hydrochlorothiazide (HYZAAR)  50-12.5 MG tablet, Take 1 tablet by mouth daily., Disp: 90 tablet, Rfl: 3 .  metFORMIN (GLUCOPHAGE XR) 750 MG 24 hr tablet, Take 2 tablets (1,500 mg total) by mouth daily with breakfast., Disp: 90 tablet, Rfl: 1 .  metoprolol succinate (TOPROL-XL) 100 MG 24 hr tablet, Take 100 mg by mouth daily. Take with or immediately following a meal., Disp: , Rfl:  .  STIOLTO RESPIMAT 2.5-2.5 MCG/ACT AERS, Inhale 2 puffs into the lungs daily., Disp: 1 Inhaler, Rfl: 0 .  tiZANidine (ZANAFLEX) 4 MG tablet, Take 1 tablet (4 mg total) by mouth every 6 (six) hours as needed for muscle spasms., Disp: 30 tablet, Rfl: 0 .  HYDROcodone-acetaminophen (NORCO) 5-325 MG tablet, Take 1 tablet by mouth every 8 (eight) hours as needed for moderate pain., Disp: 90 tablet, Rfl: 0  ROS  Constitutional: Denies any fever or chills Gastrointestinal: No reported hemesis, hematochezia, vomiting, or acute GI distress Musculoskeletal: Denies any acute onset joint swelling, redness, loss of ROM, or weakness Neurological: No reported episodes of acute onset apraxia, aphasia, dysarthria, agnosia, amnesia, paralysis, loss of coordination, or loss of consciousness  Allergies  Ms. Moris is allergic to nucynta [tapentadol].  PFSH  Drug: Ms. Mealor  reports no history of drug use. Alcohol:  reports no history of alcohol use. Tobacco:  reports that she has never smoked. She has never used smokeless tobacco. Medical:  has a past medical history of Advanced maternal age in multigravida, Asthma, Depression, GERD (gastroesophageal reflux disease), Grand multiparity, Headache, History of carpal tunnel surgery of left wrist (05/2018), Hypertension, Leg fracture, Obesity, morbid, BMI 40.0-49.9 ([place]), Post traumatic stress disorder, Pre-eclampsia, SVT (supraventricular tachycardia) ([place]), and Wrist fracture, closed. Surgical: Ms. Kneisley  has a past surgical history that includes Rotator cuff repair; Knee surgery; Cholecystectomy; Cesarean  section (2010); Cesarean section (2011); Tonsillectomy; and Cardiac electrophysiology study and ablation (01/18/2018). Family: family history includes Coronary artery disease in her brother; Diabetes in her brother, father, mother, sister, sister, sister, sister, sister, and sister; Hypertension in her brother and brother; Migraines in her father, maternal aunt, maternal  uncle, paternal aunt, and paternal uncle; Ovarian cancer in her cousin; Stomach cancer in her sister; Throat cancer in her maternal aunt.  Constitutional Exam  General appearance: Well nourished, well developed, and well hydrated. In no apparent acute distress Vitals:   09/13/18 0820  BP: (!) 108/97  Pulse: 84  Resp: 18  Temp: 98.3 F (36.8 C)  TempSrc: Oral  SpO2: 97%  Weight: (!) 345 lb (156.5 kg)  Height: '5\' 4"'$  (1.626 m)   Psych/Mental status: Alert, oriented x 3 (person, place, & time)       Eyes: PERLA Respiratory: No evidence of acute respiratory distress  Cervical Spine Area Exam  Skin & Axial Inspection: No masses, redness, edema, swelling, or associated skin lesions Alignment: Symmetrical Functional ROM: Guarding      Stability: No instability detected Muscle Tone/Strength: Functionally intact. No obvious neuro-muscular anomalies detected. Sensory (Neurological): Unimpaired Palpation: Tender             clavicle   Upper Extremity (UE) Exam    Side: Right upper extremity  Side: Left upper extremity  Skin & Extremity Inspection: Skin color, temperature, and hair growth are WNL. No peripheral edema or cyanosis. No masses, redness, swelling, asymmetry, or associated skin lesions. No contractures.  Skin & Extremity Inspection: Edema  Functional ROM: Unrestricted ROM          Functional ROM: Guarding          Muscle Tone/Strength: Functionally intact. No obvious neuro-muscular anomalies detected.  Muscle Tone/Strength: Functionally intact. No obvious neuro-muscular anomalies detected.  Sensory (Neurological):  Unimpaired          Sensory (Neurological): Unimpaired          Palpation: No palpable anomalies              Palpation: Tender              Provocative Test(s):  Phalen's test: deferred Tinel's test: deferred Apley's scratch test (touch opposite shoulder):  Action 1 (Across chest): deferred Action 2 (Overhead): deferred Action 3 (LB reach): deferred   Provocative Test(s):  Phalen's test: deferred Tinel's test: deferred Apley's scratch test (touch opposite shoulder):  Action 1 (Across chest): Decreased ROM Action 2 (Overhead): Decreased ROM Action 3 (LB reach): Decreased ROM    Lumbar Spine Area Exam  Skin & Axial Inspection: No masses, redness, or swelling Alignment: Symmetrical Functional ROM: Unrestricted ROM       Stability: No instability detected Muscle Tone/Strength: Functionally intact. No obvious neuro-muscular anomalies detected. Sensory (Neurological): Unimpaired Palpation: No palpable anomalies        Gait & Posture Assessment  Ambulation: Unassisted Gait: Relatively normal for age and body habitus Posture: WNL   Lower Extremity Exam    Side: Right lower extremity  Side: Left lower extremity  Stability: No instability observed          Stability: No instability observed          Skin & Extremity Inspection: Skin color, temperature, and hair growth are WNL. No peripheral edema or cyanosis. No masses, redness, swelling, asymmetry, or associated skin lesions. No contractures.  Skin & Extremity Inspection: Skin color, temperature, and hair growth are WNL. No peripheral edema or cyanosis. No masses, redness, swelling, asymmetry, or associated skin lesions. No contractures.  Functional ROM: Unrestricted ROM                  Functional ROM: Unrestricted ROM  Muscle Tone/Strength: Functionally intact. No obvious neuro-muscular anomalies detected.  Muscle Tone/Strength: Functionally intact. No obvious neuro-muscular anomalies detected.  Sensory  (Neurological): Unimpaired        Sensory (Neurological): Unimpaired            Palpation: No palpable anomalies  Palpation: No palpable anomalies   Assessment  Primary Diagnosis & Pertinent Problem List: The primary encounter diagnosis was Chronic shoulder pain (Fourth Area of Pain) (Bilateral) (L>R). Diagnoses of Lumbar spondylosis, Fibromyalgia, Osteoarthritis of knee (Bilateral), Neurogenic pain, and Long term prescription opiate use were also pertinent to this visit.  Status Diagnosis  Persistent Persistent Persistent 1. Chronic shoulder pain (Fourth Area of Pain) (Bilateral) (L>R)   2. Lumbar spondylosis   3. Fibromyalgia   4. Osteoarthritis of knee (Bilateral)   5. Neurogenic pain   6. Long term prescription opiate use     Problems updated and reviewed during this visit: No problems updated. Plan of Care  Pharmacotherapy (Medications Ordered): Meds ordered this encounter  Medications  . HYDROcodone-acetaminophen (NORCO) 5-325 MG tablet    Sig: Take 1 tablet by mouth every 8 (eight) hours as needed for moderate pain.    Dispense:  90 tablet    Refill:  0    Do not place this medication, or any other prescription from our practice, on "Automatic Refill". Patient may have prescription filled one day early if pharmacy is closed on scheduled refill date.    Order Specific Question:   Supervising Provider    Answer:   Milinda Pointer [[id number]]   New Prescriptions   No medications on file   Medications administered today: Adessa N. Gumbs had no medications administered during this visit. Lab-work, procedure(s), and/or referral(s): Orders Placed This Encounter  Procedures  . ToxASSURE Select 13 (MW), Urine   Imaging and/or referral(s): None  Interventional management options: Planned, scheduled, and/or pending  NOTE:NO RFAuntil depression brings her BMIbelow 35.  Diagnostic bilateral shoulder injection #1 under fluoro and IV sedation    Considering: Diagnostic left knee genicular nerve block Diagnostic left intra-articular wrist injection Diagnostic bilateral cervical facet nerve block Diagnostic bilateral cervical epidural steroid injection Possible bilateral cervical facet radiofrequency ablation Diagnostic left intra-articular shoulder injection Diagnostic bilateral suprascapular nerve block Possible bilateral suprascapular nerve radiofrequency ablation Diagnostic bilateral lumbar epidural steroid injection Diagnostic bilateral lumbar facet nerve block Possible bilateral lumbar facet radiofrequency ablation   Palliative PRN treatment(s): Palliative bilateral intra-articular Hyalgan knee injection series #2under fluoroscopic guidance    Provider-requested follow-up: Return in about 4 weeks (around 10/11/2018) for MedMgmt.  Future Appointments  Date Time Provider [place]  09/22/2018  1:00 PM [facility]  09/22/2018  2:00 PM [facility]  09/27/2018  7:45 AM Milinda Pointer, MD [facility] None  10/10/2018  8:00 AM Vevelyn Francois, NP [facility] None  11/02/2018  2:00 PM Hubbard Hartshorn, FNP [place] [facility]  11/06/2018  1:00 PM [facility] None  11/06/2018  1:15 PM Lloyd Huger, MD [facility] None  11/06/2018  1:45 PM [facility] INFUSION CHAIR 1 [facility] None  05/13/2019  3:00 PM Lawhorn, Lara Mulch, CNM [facility] None   Primary Care Physician: Hubbard Hartshorn, FNP Location: [facility] Note by: Vevelyn Francois NP Date: 09/13/2018; Time: 10:10 AM  Pain Score Disclaimer: We use the NRS-11 scale. This is a self-reported, subjective measurement of pain severity with only modest accuracy. It is used primarily to identify changes within a particular patient. It must be  understood that outpatient pain scales are significantly less accurate that those used for research, where they can be applied under ideal controlled  circumstances with minimal exposure to variables. In reality, the score is likely to be a combination of pain intensity and pain affect, where pain affect describes the degree of emotional arousal or changes in action readiness caused by the sensory experience of pain. Factors such as social and work situation, setting, emotional state, anxiety levels, expectation, and prior pain experience may influence pain perception and show large inter-individual differences that may also be affected by time variables.  Patient instructions provided during this appointment: Patient Instructions   ____________________________________________________________________________________________  Medication Rules  Purpose: To inform patients, and their family members, of our rules and regulations.  Applies to: All patients receiving prescriptions (written or electronic).  Pharmacy of record: Pharmacy where electronic prescriptions will be sent. If written prescriptions are taken to a different pharmacy, please inform the nursing staff. The pharmacy listed in the electronic medical record should be the one where you would like electronic prescriptions to be sent.  Electronic prescriptions: In compliance with the [place] (STOP) Act of 2017 (Session Lanny Cramp [phone]), effective September 12, 2018, all controlled substances must be electronically prescribed. Calling prescriptions to the pharmacy will cease to exist.  Prescription refills: Only during scheduled appointments. Applies to all prescriptions.  NOTE: The following applies primarily to controlled substances (Opioid* Pain Medications).   Patient's responsibilities: 1. Pain Pills: Bring all pain pills to every appointment (except for procedure appointments). 2. Pill Bottles: Bring pills in original pharmacy bottle. Always bring the newest bottle. Bring bottle, even if empty. 3. Medication refills: You are responsible  for knowing and keeping track of what medications you take and those you need refilled. The day before your appointment: write a list of all prescriptions that need to be refilled. The day of the appointment: give the list to the admitting nurse. Prescriptions will be written only during appointments. If you forget a medication: it will not be "Called in", "Faxed", or "electronically sent". You will need to get another appointment to get these prescribed. No early refills. Do not call asking to have your prescription filled early. 4. Prescription Accuracy: You are responsible for carefully inspecting your prescriptions before leaving our office. Have the discharge nurse carefully go over each prescription with you, before taking them home. Make sure that your name is accurately spelled, that your address is correct. Check the name and dose of your medication to make sure it is accurate. Check the number of pills, and the written instructions to make sure they are clear and accurate. Make sure that you are given enough medication to last until your next medication refill appointment. 5. Taking Medication: Take medication as prescribed. When it comes to controlled substances, taking less pills or less frequently than prescribed is permitted and encouraged. Never take more pills than instructed. Never take medication more frequently than prescribed.  6. Inform other Doctors: Always inform, all of your healthcare providers, of all the medications you take. 7. Pain Medication from other Providers: You are not allowed to accept any additional pain medication from any other Doctor or Healthcare provider. There are two exceptions to this rule. (see below) In the event that you require additional pain medication, you are responsible for notifying us, as stated below. 8. Medication Agreement: You are responsible for carefully reading and following our Medication Agreement. This must be signed before receiving any  prescriptions  from our practice. Safely store a copy of your signed Agreement. Violations to the Agreement will result in no further prescriptions. (Additional copies of our Medication Agreement are available upon request.) 9. Laws, Rules, & Regulations: All patients are expected to follow all Federal and [employer], [employer], Rules, [employer] [facility] [employer]. Ignorance of the Laws does not constitute a valid excuse. The use of any illegal substances is prohibited. 10. Adopted CDC guidelines & recommendations: Target dosing levels will be at or below 60 MME/day. Use of benzodiazepines** is not recommended.  Exceptions: There are only two exceptions to the rule of not receiving pain medications from other Healthcare Providers. 1. Exception #1 (Emergencies): In the event of an emergency (i.e.: accident requiring emergency care), you are allowed to receive additional pain medication. However, you are responsible for: As soon as you are able, call our office [phone], at any time of the day or night, and leave a message stating your name, the date and nature of the emergency, and the name and dose of the medication prescribed. In the event that your call is answered by a member of our staff, make sure to document and save the date, time, and the name of the person that took your information.  2. Exception #2 (Planned Surgery): In the event that you are scheduled by another doctor or dentist to have any type of surgery or procedure, you are allowed (for a period no longer than 30 days), to receive additional pain medication, for the acute post-op pain. However, in this case, you are responsible for picking up a copy of our "Post-op Pain Management for Surgeons" handout, and giving it to your surgeon or dentist. This document is available at our office, and does not require an appointment to obtain it. Simply go to our office during business hours (Monday-Thursday from 8:00 AM to 4:00 PM) (Friday 8:00 AM to 12:00 Noon) or  if you have a scheduled appointment with [place], prior to your surgery, and ask for it by name. In addition, you will need to provide [place] with your name, name of your surgeon, type of surgery, and date of procedure or surgery.  *Opioid medications include: morphine, codeine, oxycodone, oxymorphone, hydrocodone, hydromorphone, meperidine, tramadol, tapentadol, buprenorphine, fentanyl, methadone. **Benzodiazepine medications include: diazepam (Valium), alprazolam (Xanax), clonazepam (Klonopine), lorazepam (Ativan), clorazepate (Tranxene), chlordiazepoxide (Librium), estazolam (Prosom), oxazepam (Serax), temazepam (Restoril), triazolam (Halcion) (Last updated: 11/09/2017) ____________________________________________________________________________________________   BMI Assessment: Estimated body mass index is 59.22 kg/m as calculated from the following:   Height as of this encounter: '5\' 4"'$  (1.626 m).   Weight as of this encounter: 345 lb (156.5 kg).  BMI interpretation table: BMI level Category Range association with higher incidence of chronic pain  <18 kg/m2 Underweight   18.5-24.9 kg/m2 Ideal body weight   25-29.9 kg/m2 Overweight Increased incidence by 20%  30-34.9 kg/m2 Obese (Class I) Increased incidence by 68%  35-39.9 kg/m2 Severe obesity (Class II) Increased incidence by 136%  >40 kg/m2 Extreme obesity (Class III) Increased incidence by 254%   Patient's current BMI Ideal Body weight  Body mass index is 59.22 kg/m. Ideal body weight: 54.7 kg (120 lb 9.5 oz) Adjusted ideal body weight: 95.4 kg (210 lb 5.7 oz)   BMI Readings from Last 4 Encounters:  09/13/18 59.22 kg/m  09/10/18 59.32 kg/m  08/30/18 59.39 kg/m  08/15/18 42.40 kg/m   Wt Readings from Last 4 Encounters:  09/13/18 (!) 345 lb (156.5 kg)  09/10/18 (!) 345 lb 9.6 oz (156.8 kg)  08/30/18 (!) 346 lb (156.9 kg)  08/15/18 247 lb (112 kg)

## 2018-09-13 NOTE — Patient Instructions (Addendum)
____________________________________________________________________________________________  Medication Rules  Purpose: To inform patients, and their family members, of our rules and regulations.  Applies to: All patients receiving prescriptions (written or electronic).  Pharmacy of record: Pharmacy where electronic prescriptions will be sent. If written prescriptions are taken to a different pharmacy, please inform the nursing staff. The pharmacy listed in the electronic medical record should be the one where you would like electronic prescriptions to be sent.  Electronic prescriptions: In compliance with the [place] Strengthen Opioid Misuse Prevention (STOP) Act of 2017 (Session Law 2017-74/H243), effective September 12, 2018, all controlled substances must be electronically prescribed. Calling prescriptions to the pharmacy will cease to exist.  Prescription refills: Only during scheduled appointments. Applies to all prescriptions.  NOTE: The following applies primarily to controlled substances (Opioid* Pain Medications).   Patient's responsibilities: 1. Pain Pills: Bring all pain pills to every appointment (except for procedure appointments). 2. Pill Bottles: Bring pills in original pharmacy bottle. Always bring the newest bottle. Bring bottle, even if empty. 3. Medication refills: You are responsible for knowing and keeping track of what medications you take and those you need refilled. The day before your appointment: write a list of all prescriptions that need to be refilled. The day of the appointment: give the list to the admitting nurse. Prescriptions will be written only during appointments. If you forget a medication: it will not be "Called in", "Faxed", or "electronically sent". You will need to get another appointment to get these prescribed. No early refills. Do not call asking to have your prescription filled early. 4. Prescription Accuracy: You are responsible for  carefully inspecting your prescriptions before leaving our office. Have the discharge nurse carefully go over each prescription with you, before taking them home. Make sure that your name is accurately spelled, that your address is correct. Check the name and dose of your medication to make sure it is accurate. Check the number of pills, and the written instructions to make sure they are clear and accurate. Make sure that you are given enough medication to last until your next medication refill appointment. 5. Taking Medication: Take medication as prescribed. When it comes to controlled substances, taking less pills or less frequently than prescribed is permitted and encouraged. Never take more pills than instructed. Never take medication more frequently than prescribed.  6. Inform other Doctors: Always inform, all of your healthcare providers, of all the medications you take. 7. Pain Medication from other Providers: You are not allowed to accept any additional pain medication from any other Doctor or Healthcare provider. There are two exceptions to this rule. (see below) In the event that you require additional pain medication, you are responsible for notifying us, as stated below. 8. Medication Agreement: You are responsible for carefully reading and following our Medication Agreement. This must be signed before receiving any prescriptions from our practice. Safely store a copy of your signed Agreement. Violations to the Agreement will result in no further prescriptions. (Additional copies of our Medication Agreement are available upon request.) 9. Laws, Rules, & Regulations: All patients are expected to follow all Federal and State Laws, Statutes, Rules, & Regulations. Ignorance of the Laws does not constitute a valid excuse. The use of any illegal substances is prohibited. 10. Adopted CDC guidelines & recommendations: Target dosing levels will be at or below 60 MME/day. Use of benzodiazepines** is not  recommended.  Exceptions: There are only two exceptions to the rule of not receiving pain medications from other Healthcare Providers. 1.   Exception #1 (Emergencies): In the event of an emergency (i.e.: accident requiring emergency care), you are allowed to receive additional pain medication. However, you are responsible for: As soon as you are able, call our office [phone], at any time of the day or night, and leave a message stating your name, the date and nature of the emergency, and the name and dose of the medication prescribed. In the event that your call is answered by a member of our staff, make sure to document and save the date, time, and the name of the person that took your information.  2. Exception #2 (Planned Surgery): In the event that you are scheduled by another doctor or dentist to have any type of surgery or procedure, you are allowed (for a period no longer than 30 days), to receive additional pain medication, for the acute post-op pain. However, in this case, you are responsible for picking up a copy of our "Post-op Pain Management for Surgeons" handout, and giving it to your surgeon or dentist. This document is available at our office, and does not require an appointment to obtain it. Simply go to our office during business hours (Monday-Thursday from 8:00 AM to 4:00 PM) (Friday 8:00 AM to 12:00 Noon) or if you have a scheduled appointment with [place], prior to your surgery, and ask for it by name. In addition, you will need to provide [place] with your name, name of your surgeon, type of surgery, and date of procedure or surgery.  *Opioid medications include: morphine, codeine, oxycodone, oxymorphone, hydrocodone, hydromorphone, meperidine, tramadol, tapentadol, buprenorphine, fentanyl, methadone. **Benzodiazepine medications include: diazepam (Valium), alprazolam (Xanax), clonazepam (Klonopine), lorazepam (Ativan), clorazepate (Tranxene), chlordiazepoxide (Librium), estazolam (Prosom),  oxazepam (Serax), temazepam (Restoril), triazolam (Halcion) (Last updated: 11/09/2017) ____________________________________________________________________________________________   BMI Assessment: Estimated body mass index is 59.22 kg/m as calculated from the following:   Height as of this encounter: 5\' 4"  (1.626 m).   Weight as of this encounter: 345 lb (156.5 kg).  BMI interpretation table: BMI level Category Range association with higher incidence of chronic pain  <18 kg/m2 Underweight   18.5-24.9 kg/m2 Ideal body weight   25-29.9 kg/m2 Overweight Increased incidence by 20%  30-34.9 kg/m2 Obese (Class I) Increased incidence by 68%  35-39.9 kg/m2 Severe obesity (Class II) Increased incidence by 136%  >40 kg/m2 Extreme obesity (Class III) Increased incidence by 254%   Patient's current BMI Ideal Body weight  Body mass index is 59.22 kg/m. Ideal body weight: 54.7 kg (120 lb 9.5 oz) Adjusted ideal body weight: 95.4 kg (210 lb 5.7 oz)   BMI Readings from Last 4 Encounters:  09/13/18 59.22 kg/m  09/10/18 59.32 kg/m  08/30/18 59.39 kg/m  08/15/18 42.40 kg/m   Wt Readings from Last 4 Encounters:  09/13/18 (!) 345 lb (156.5 kg)  09/10/18 (!) 345 lb 9.6 oz (156.8 kg)  08/30/18 (!) 346 lb (156.9 kg)  08/15/18 247 lb (112 kg)

## 2018-09-13 NOTE — Progress Notes (Signed)
Nursing Pain Medication Assessment:  Safety precautions to be maintained throughout the outpatient stay will include: orient to surroundings, keep bed in low position, maintain call bell within reach at all times, provide assistance with transfer out of bed and ambulation.  Medication Inspection Compliance: Pill count conducted under aseptic conditions, in front of the patient. Neither the pills nor the bottle was removed from the patient's sight at any time. Once count was completed pills were immediately returned to the patient in their original bottle.  Medication: Hydrocodone/APAP Pill/Patch Count: 2 of 90 pills remain Pill/Patch Appearance: Markings consistent with prescribed medication Bottle Appearance: Standard pharmacy container. Clearly labeled. Filled Date: 34 / 02 / 2019 Last Medication intake:  Yesterday

## 2018-09-20 LAB — TOXASSURE SELECT 13 (MW), URINE

## 2018-09-22 ENCOUNTER — Ambulatory Visit: Payer: Medicaid Other

## 2018-09-25 ENCOUNTER — Telehealth: Payer: Self-pay | Admitting: *Deleted

## 2018-09-25 ENCOUNTER — Ambulatory Visit: Payer: Medicaid Other

## 2018-09-26 NOTE — Progress Notes (Signed)
[name]'s Name: Lauren [name]  MRN: [id number]  Referring Provider: Hubbard Hartshorn, FNP  DOB: [birth year]  PCP: Hubbard Hartshorn, FNP  DOS: 09/27/2018  Note by: Gaspar Cola, MD  Service setting: Ambulatory outpatient  Specialty: Interventional Pain Management  [name]  Location: [facility] (AMB) Pain Management Facility  Visit type: Interventional Procedure   Primary Reason for Visit: Interventional Pain Management Treatment. CC: Shoulder Pain (left) and Wrist Pain (left)  Procedure #1:  Anesthesia, Analgesia, Anxiolysis:  Type: Diagnostic Glenohumeral Joint (shoulder) Injection          Primary Purpose: Diagnostic Region: Superior Shoulder Area Level:  Shoulder Target Area: Glenohumeral Joint (shoulder) Approach: Anterior approach. Laterality: Bilateral  Type: Moderate (Conscious) Sedation combined with Local Anesthesia Indication(s): Analgesia and Anxiety Route: Intravenous (IV) IV Access: Secured Sedation: Meaningful verbal contact was maintained at all times during Lauren procedure  Local Anesthetic: Lidocaine 1-2%  Position: Supine   Indications: 1. Chronic shoulder pain (Fourth Area of Pain) (Bilateral) (L>R)   2. Osteoarthritis of shoulder (Bilateral)    Procedure #2:  Anesthesia, Analgesia, Anxiolysis:  Type: Ligament/Tendon sheath (41324) Injection.  #1  Purpose: Diagnostic Target Area: Extensor policies longus and extensor indices Region: Dorsal aspect of left forearm Approach: Percutanous Level: Distal third Laterality: Left-side  Type: Moderate (Conscious) Sedation combined with Local Anesthesia Indication(s): Analgesia and Anxiety Local Anesthetic: Lidocaine 1-2% Route: Intravenous (IV) IV Access: Secured Sedation: Meaningful verbal contact was maintained at all times during Lauren procedure   Position: Supine   Indications: 1. Chronic wrist pain (Secondary Area of Pain) (Left)   2. Chronic forearm pain (Left)    Pain Score: Pre-procedure: 3  /10 Post-procedure: 0-No pain/10  Pre-op Assessment:  Lauren [name] is a 43 y.o. (year old), female [name], seen today for interventional treatment. She  has a past surgical history that includes Rotator cuff repair; Knee surgery; Cholecystectomy; Cesarean section (2010); Cesarean section (2011); Tonsillectomy; and Cardiac electrophysiology study and ablation (01/18/2018). Lauren [name] has a current medication list which includes Lauren following prescription(s): acetaminophen, albuterol, aspirin ec, beclomethasone, diltiazem, esomeprazole, fluticasone, furosemide, hydrocodone-acetaminophen, ibuprofen, losartan-hydrochlorothiazide, metformin, metoprolol succinate, stiolto respimat, tizanidine, and gabapentin, and Lauren following Facility-Administered Medications: fentanyl and midazolam. Her primarily concern today is Lauren Shoulder Pain (left) and Wrist Pain (left)  Initial Vital Signs:  Pulse/HCG Rate: 93ECG Heart Rate: (!) 104 Temp: 98.5 F (36.9 C) Resp: 16 BP: (!) 143/76 SpO2: 100 %  BMI: Estimated body mass index is 59.22 kg/m as calculated from Lauren following:   Height as of this encounter: 5\' 4"  (1.626 m).   Weight as of this encounter: 345 lb (156.5 kg).  Risk Assessment: Allergies: Reviewed. She is allergic to nucynta [tapentadol].  Allergy Precautions: None required Coagulopathies: Reviewed. None identified.  Blood-thinner therapy: None at this time Active Infection(s): Reviewed. None identified. Lauren [name] is afebrile  Site Confirmation: Lauren [name] was asked to confirm Lauren procedure and laterality before marking Lauren site Procedure checklist: Completed Consent: Before Lauren procedure and under Lauren influence of no sedative(s), amnesic(s), or anxiolytics, Lauren [name] was informed of Lauren treatment options, risks and possible complications. To fulfill our ethical and legal obligations, as recommended by Lauren [employer] Association's Code of Ethics, I have informed Lauren [name] of  my clinical impression; Lauren nature and purpose of Lauren treatment or procedure; Lauren risks, benefits, and possible complications of Lauren intervention; Lauren alternatives, including doing nothing; Lauren risk(s) and benefit(s) of Lauren alternative treatment(s) or procedure(s); and Lauren risk(s) and benefit(s)  of doing nothing. Lauren [name] was provided information about Lauren general risks and possible complications associated with Lauren procedure. These may include, but are not limited to: failure to achieve desired goals, infection, bleeding, organ or nerve damage, allergic reactions, paralysis, and death. In addition, Lauren [name] was informed of those risks and complications associated to Lauren procedure, such as failure to decrease pain; infection; bleeding; organ or nerve damage with subsequent damage to sensory, motor, and/or autonomic systems, resulting in permanent pain, numbness, and/or weakness of one or several areas of Lauren body; allergic reactions; (i.e.: anaphylactic reaction); and/or death. Furthermore, Lauren [name] was informed of those risks and complications associated with Lauren medications. These include, but are not limited to: allergic reactions (i.e.: anaphylactic or anaphylactoid reaction(s)); adrenal axis suppression; blood sugar elevation that in diabetics may result in ketoacidosis or comma; water retention that in patients with history of congestive heart failure may result in shortness of breath, pulmonary edema, and decompensation with resultant heart failure; weight gain; swelling or edema; medication-induced neural toxicity; particulate matter embolism and blood vessel occlusion with resultant organ, and/or nervous system infarction; and/or aseptic necrosis of one or more joints. Finally, Lauren [name] was informed that Medicine is not an exact science; therefore, there is also Lauren possibility of unforeseen or unpredictable risks and/or possible complications that may result in a catastrophic outcome. Lauren  [name] indicated having understood very clearly. We have given Lauren [name] no guarantees and we have made no promises. Enough time was given to Lauren [name] to ask questions, all of which were answered to Lauren [name]'s satisfaction. Lauren [name] has indicated that she wanted to continue with Lauren procedure. Attestation: I, Lauren ordering provider, attest that I have discussed with Lauren [name] Lauren benefits, risks, side-effects, alternatives, likelihood of achieving goals, and potential problems during recovery for Lauren procedure that I have provided informed consent. Date  Time: 09/27/2018  8:05 AM  Pre-Procedure Preparation:  Monitoring: As per clinic protocol. Respiration, ETCO2, SpO2, BP, heart rate and rhythm monitor placed and checked for adequate function Safety Precautions: [name] was assessed for positional comfort and pressure points before starting Lauren procedure. Time-out: I initiated and conducted Lauren "Time-out" before starting Lauren procedure, as per protocol. Lauren [name] was asked to participate by confirming Lauren accuracy of Lauren "Time Out" information. Verification of Lauren correct person, site, and procedure were performed and confirmed by me, Lauren nursing staff, and Lauren [name]. "Time-out" conducted as per Joint Commission's Universal Protocol (UP.01.01.01). Time: 0853  Description of Procedure #1:  Area Prepped: Entire shoulder Area Prepping solution: ChloraPrep (2% chlorhexidine gluconate and 70% isopropyl alcohol) Safety Precautions: Aspiration looking for blood return was conducted prior to all injections. At no point did we inject any substances, as a needle was being advanced. No attempts were made at seeking any paresthesias. Safe injection practices and needle disposal techniques used. Medications properly checked for expiration dates. SDV (single dose vial) medications used. Description of Lauren Procedure: Protocol guidelines were followed. Lauren [name] was placed in position over Lauren  procedure table. Lauren target area was identified and Lauren area prepped in Lauren usual manner. Skin & deeper tissues infiltrated with local anesthetic. Appropriate amount of time allowed to pass for local anesthetics to take effect. Lauren procedure needles were then advanced to Lauren target area. Proper needle placement secured. Negative aspiration confirmed. Solution injected in intermittent fashion, asking for systemic symptoms every 0.5cc of injectate. Lauren needles were then removed and Lauren area cleansed, making sure to leave some of  Lauren prepping solution back to take advantage of its long term bactericidal properties.    Vitals:   09/27/18 0905 09/27/18 0913 09/27/18 0923 09/27/18 0933  BP: 140/64 121/72 125/76 (!) 141/82  Pulse: 92     Resp: (!) 24 (!) 21 20 (!) 26  Temp:  99.1 F (37.3 C)  98.4 F (36.9 C)  TempSrc:  Temporal  Temporal  SpO2: 97% 100% 95% 98%  Weight:      Height:        Start Time: 0853 hrs. End Time: 0905 hrs. Materials:  Needle(s) Type: Spinal Needle Gauge: 22G Length: 3.5-in Medication(s): Please see orders for medications and dosing details.  Imaging Guidance (Non-Spinal) for procedure #1:  Type of Imaging Technique: Fluoroscopy Guidance (Non-Spinal) Indication(s): Assistance in needle guidance and placement for procedures requiring needle placement in or near specific anatomical locations not easily accessible without such assistance. Exposure Time: Please see nurses notes. Contrast: None used. Fluoroscopic Guidance: I was personally present during Lauren use of fluoroscopy. "Tunnel Vision Technique" used to obtain Lauren best possible view of Lauren target area. Parallax error corrected before commencing Lauren procedure. "Direction-depth-direction" technique used to introduce Lauren needle under continuous pulsed fluoroscopy. Once target was reached, antero-posterior, oblique, and lateral fluoroscopic projection used confirm needle placement in all planes. Images permanently  stored in EMR. Interpretation: No contrast injected. I personally interpreted Lauren imaging intraoperatively. Adequate needle placement confirmed in multiple planes. Permanent images saved into Lauren [name]'s record.   Description of Procedure #2:  Area Prepped: Left forearm Prepping solution: ChloraPrep (2% chlorhexidine gluconate and 70% isopropyl alcohol) Safety Precautions: Aspiration looking for blood return was conducted prior to all injections. At no point did we inject any substances, as a needle was being advanced. No attempts were made at seeking any paresthesias. Safe injection practices and needle disposal techniques used. Medications properly checked for expiration dates. SDV (single dose vial) medications used. Description of Lauren Procedure: Protocol guidelines were followed. Lauren [name] was placed in position. Lauren target area was identified and prepped in Lauren usual manner. Skin & deeper tissues infiltrated with local anesthetic. Appropriate time provided for local anesthetics to take effect. Lauren procedure needle was slowly advanced to target area. Proper needle placement secured. Negative aspiration confirmed. Solution injected in intermittent fashion, asking for systemic symptoms every 0.5cc. Needle(s) removed and area cleaned, making sure to leave some prepping solution back to take advantage of its long term bactericidal properties.  Vitals:   09/27/18 0905 09/27/18 0913 09/27/18 0923 09/27/18 0933  BP: 140/64 121/72 125/76 (!) 141/82  Pulse: 92     Resp: (!) 24 (!) 21 20 (!) 26  Temp:  99.1 F (37.3 C)  98.4 F (36.9 C)  TempSrc:  Temporal  Temporal  SpO2: 97% 100% 95% 98%  Weight:      Height:        Start Time: 0853 hrs. End Time: 0905 hrs. Materials:  Needle(s) Type: Regular needle Gauge: 25G Length: 1.5-in Medication(s): Please see orders for medications and dosing details.  Imaging Guidance for procedure #2:  Type of Imaging Technique: None used Indication(s):  N/A Exposure Time: No [name] exposure Contrast: None used. Fluoroscopic Guidance: N/A Ultrasound Guidance: N/A Interpretation: N/A  Post-operative Assessment:  Post-procedure Vital Signs:  Pulse/HCG Rate: 92(!) 101 Temp: 98.4 F (36.9 C) Resp: (!) 26 BP: (!) 141/82 SpO2: 98 %  EBL: None  Complications: No immediate post-treatment complications observed by team, or reported by [name].  Note: Lauren [name] tolerated Lauren  entire procedure well. A repeat set of vitals were taken after Lauren procedure and Lauren [name] was kept under observation following institutional policy, for this type of procedure. Post-procedural neurological assessment was performed, showing return to baseline, prior to discharge. Lauren [name] was provided with post-procedure discharge instructions, including a section on how to identify potential problems. Should any problems arise concerning this procedure, Lauren [name] was given instructions to immediately contact us, at any time, without hesitation. In any case, we plan to contact Lauren [name] by telephone for a follow-up status report regarding this interventional procedure.  Comments:  No additional relevant information.  Plan of Care   Imaging Orders     DG C-Arm 1-60 Min-No Report  Procedure Orders     SHOULDER INJECTION     Injection tendon or ligament  Medications ordered for procedure: Meds ordered this encounter  Medications  . lidocaine (XYLOCAINE) 2 % (with pres) injection 400 mg  . midazolam (VERSED) 5 MG/5ML injection 1-2 mg    Make sure Flumazenil is available in Lauren pyxis when using this medication. If oversedation occurs, administer 0.2 mg IV over 15 sec. If after 45 sec no response, administer 0.2 mg again over 1 min; may repeat at 1 min intervals; not to exceed 4 doses (1 mg)  . fentaNYL (SUBLIMAZE) injection 25-50 mcg    Make sure Narcan is available in Lauren pyxis when using this medication. In Lauren event of respiratory depression (RR< 8/min):  Titrate NARCAN (naloxone) in increments of 0.1 to 0.2 mg IV at 2-3 minute intervals, until desired degree of reversal.  . lactated ringers infusion 1,000 mL  . ropivacaine (PF) 2 mg/mL (0.2%) (NAROPIN) injection 9 mL  . methylPREDNISolone acetate (DEPO-MEDROL) injection 80 mg  . dexamethasone (DECADRON) injection 10 mg  . ropivacaine (PF) 2 mg/mL (0.2%) (NAROPIN) injection 4 mL   Medications administered: We administered lidocaine, midazolam, fentaNYL, lactated ringers, ropivacaine (PF) 2 mg/mL (0.2%), methylPREDNISolone acetate, dexamethasone, and ropivacaine (PF) 2 mg/mL (0.2%).  See Lauren medical record for exact dosing, route, and time of administration.  Disposition: Discharge home  Discharge Date & Time: 09/27/2018; 0941 hrs.   Physician-requested Follow-up: Return for post-procedure eval (2 wks), w/ Dr. Dossie Arbour.  Future Appointments  Date Time Provider [place]  10/06/2018  3:00 PM [facility] [place]  10/06/2018  4:00 PM [facility]  10/10/2018  8:00 AM Vevelyn Francois, NP [facility] None  10/17/2018  8:15 AM Milinda Pointer, MD [facility] None  11/02/2018  2:00 PM Hubbard Hartshorn, FNP [place] [facility]  11/06/2018  1:00 PM [facility] None  11/06/2018  1:15 PM Lloyd Huger, MD [facility] None  11/06/2018  1:45 PM [facility] INFUSION CHAIR 1 [facility] None  05/13/2019  3:00 PM Lawhorn, Lara Mulch, CNM [facility] None   Primary Care Physician: Hubbard Hartshorn, FNP Location: [facility] Note by: Gaspar Cola, MD Date: 09/27/2018; Time: 10:01 AM  Disclaimer:  Medicine is not an Chief Strategy Officer. Lauren only guarantee in medicine is that nothing is guaranteed. It is important to note that Lauren decision to proceed with this intervention was based on Lauren information collected from Lauren [name]. Lauren Data and conclusions were drawn from Lauren [name]'s questionnaire, Lauren interview, and Lauren physical examination. Because Lauren  information was provided in large part by Lauren [name], it cannot be guaranteed that it has not been purposely or unconsciously manipulated. Every effort has been made to obtain as much relevant data  as possible for this evaluation. It is important to note that Lauren conclusions that lead to this procedure are derived in large part from Lauren available data. Always take into account that Lauren treatment will also be dependent on availability of resources and existing treatment guidelines, considered by other Pain Management Practitioners as being common knowledge and practice, at Lauren time of Lauren intervention. For Medico-Legal purposes, it is also important to point out that variation in procedural techniques and pharmacological choices are Lauren acceptable norm. Lauren indications, contraindications, technique, and results of Lauren above procedure should only be interpreted and judged by a Board-Certified Interventional Pain Specialist with extensive familiarity and expertise in Lauren same exact procedure and technique.

## 2018-09-26 NOTE — Patient Instructions (Signed)

## 2018-09-27 ENCOUNTER — Ambulatory Visit
Admission: RE | Admit: 2018-09-27 | Discharge: 2018-09-27 | Disposition: A | Discharge status: Home / Self Care | Payer: Medicaid Other | Source: Ambulatory Visit | Attending: Pain Medicine | Admitting: Pain Medicine

## 2018-09-27 ENCOUNTER — Ambulatory Visit (HOSPITAL_BASED_OUTPATIENT_CLINIC_OR_DEPARTMENT_OTHER): Payer: Medicaid Other | Admitting: Pain Medicine

## 2018-09-27 ENCOUNTER — Encounter: Payer: Self-pay | Admitting: Pain Medicine

## 2018-09-27 ENCOUNTER — Other Ambulatory Visit: Payer: Self-pay

## 2018-09-27 VITALS — BP 141/82 | HR 92 | Temp 98.4°F | Resp 26 | Ht 64.0 in | Wt 345.0 lb

## 2018-09-27 DIAGNOSIS — M79632 Pain in left forearm: Secondary | ICD-10-CM | POA: Diagnosis present

## 2018-09-27 DIAGNOSIS — M19012 Primary osteoarthritis, left shoulder: Secondary | ICD-10-CM

## 2018-09-27 DIAGNOSIS — M25511 Pain in right shoulder: Secondary | ICD-10-CM | POA: Diagnosis present

## 2018-09-27 DIAGNOSIS — M19011 Primary osteoarthritis, right shoulder: Secondary | ICD-10-CM | POA: Diagnosis present

## 2018-09-27 DIAGNOSIS — M25532 Pain in left wrist: Secondary | ICD-10-CM | POA: Insufficient documentation

## 2018-09-27 DIAGNOSIS — G8929 Other chronic pain: Secondary | ICD-10-CM | POA: Insufficient documentation

## 2018-09-27 DIAGNOSIS — M25512 Pain in left shoulder: Secondary | ICD-10-CM | POA: Diagnosis present

## 2018-09-27 MED ORDER — FENTANYL CITRATE (PF) 100 MCG/2ML IJ SOLN
INTRAMUSCULAR | Status: AC
  Filled 2018-09-27: qty 2

## 2018-09-27 MED ORDER — LACTATED RINGERS IV SOLN
1000.0000 mL | Freq: Once | INTRAVENOUS | Status: AC
  Administered 2018-09-27: 1000 mL via INTRAVENOUS

## 2018-09-27 MED ORDER — ROPIVACAINE HCL 2 MG/ML IJ SOLN
9.0000 mL | Freq: Once | INTRAMUSCULAR | Status: AC
  Administered 2018-09-27: 9 mL via INTRA_ARTICULAR

## 2018-09-27 MED ORDER — METHYLPREDNISOLONE ACETATE 80 MG/ML IJ SUSP
INTRAMUSCULAR | Status: AC
  Filled 2018-09-27: qty 1

## 2018-09-27 MED ORDER — FENTANYL CITRATE (PF) 100 MCG/2ML IJ SOLN
25.0000 ug | INTRAMUSCULAR | Status: DC | PRN
  Administered 2018-09-27: 100 ug via INTRAVENOUS

## 2018-09-27 MED ORDER — DEXAMETHASONE SODIUM PHOSPHATE 10 MG/ML IJ SOLN
10.0000 mg | Freq: Once | INTRAMUSCULAR | Status: AC
  Administered 2018-09-27: 10 mg
  Filled 2018-09-27: qty 1

## 2018-09-27 MED ORDER — LIDOCAINE HCL 2 % IJ SOLN
20.0000 mL | Freq: Once | INTRAMUSCULAR | Status: AC
  Administered 2018-09-27: 400 mg

## 2018-09-27 MED ORDER — LIDOCAINE HCL 2 % IJ SOLN
INTRAMUSCULAR | Status: AC
  Filled 2018-09-27: qty 20

## 2018-09-27 MED ORDER — METHYLPREDNISOLONE ACETATE 80 MG/ML IJ SUSP
80.0000 mg | Freq: Once | INTRAMUSCULAR | Status: AC
  Administered 2018-09-27: 80 mg via INTRA_ARTICULAR

## 2018-09-27 MED ORDER — MIDAZOLAM HCL 5 MG/5ML IJ SOLN
1.0000 mg | INTRAMUSCULAR | Status: DC | PRN
  Administered 2018-09-27: 3 mg via INTRAVENOUS

## 2018-09-27 MED ORDER — MIDAZOLAM HCL 5 MG/5ML IJ SOLN
INTRAMUSCULAR | Status: AC
  Filled 2018-09-27: qty 5

## 2018-09-27 MED ORDER — ROPIVACAINE HCL 2 MG/ML IJ SOLN
4.0000 mL | Freq: Once | INTRAMUSCULAR | Status: AC
  Administered 2018-09-27: 4 mL
  Filled 2018-09-27: qty 10

## 2018-09-27 MED ORDER — ROPIVACAINE HCL 2 MG/ML IJ SOLN
INTRAMUSCULAR | Status: AC
  Filled 2018-09-27: qty 10

## 2018-09-27 NOTE — Progress Notes (Signed)
Safety precautions to be maintained throughout the outpatient stay will include: orient to surroundings, keep bed in low position, maintain call bell within reach at all times, provide assistance with transfer out of bed and ambulation.  

## 2018-09-28 ENCOUNTER — Telehealth: Payer: Self-pay

## 2018-10-02 ENCOUNTER — Ambulatory Visit: Payer: Self-pay | Admitting: Surgery

## 2018-10-03 ENCOUNTER — Encounter
Admission: RE | Admit: 2018-10-03 | Discharge: 2018-10-03 | Disposition: A | Discharge status: Home / Self Care | Payer: Medicaid Other | Source: Ambulatory Visit | Attending: Surgery | Admitting: Surgery

## 2018-10-03 ENCOUNTER — Other Ambulatory Visit: Payer: Self-pay

## 2018-10-03 ENCOUNTER — Other Ambulatory Visit: Payer: Medicaid Other

## 2018-10-03 DIAGNOSIS — Z01812 Encounter for preprocedural laboratory examination: Secondary | ICD-10-CM | POA: Diagnosis not present

## 2018-10-03 HISTORY — DX: Prediabetes: R73.03

## 2018-10-03 NOTE — Patient Instructions (Signed)
Your procedure is scheduled on: Thursday 10/11/18  Report to [place]. To find out your arrival time please call [phone] between 1PM - 3PM on Wednesday 10/10/18.   Remember: Instructions that are not followed completely may result in serious medical risk, up to and including death, or upon the discretion of your surgeon and anesthesiologist your surgery may need to be rescheduled.      _X__ 1. Do not eat food after midnight the night before your procedure.                 No gum chewing or hard candies. You may drink water up to 2 hours                 before you are scheduled to arrive for your surgery.   __X__2.  On the morning of surgery brush your teeth with toothpaste and water, you may rinse your mouth with mouthwash if you wish.  Do not swallow any toothpaste or mouthwash.     _X__ 3.  No Alcohol for 24 hours before or after surgery.   __X__4.  Notify your doctor if there is any change in your medical condition      (cold, fever, infections).     Do not wear jewelry, make-up, hairpins, clips or nail polish. Do not wear lotions, powders, or perfumes. Do not wear deodorant.  Do not shave 48 hours prior to surgery. Men may shave face and neck. Do not bring valuables to the hospital.    [facility] is not responsible for any belongings or valuables.  Contacts, dentures/partials or body piercings may not be worn into surgery. Bring a case for your contacts, glasses or hearing aids, a denture cup will be supplied.    Patients discharged the day of surgery will not be allowed to drive home.    Please read over the following fact sheets that you were given:   MRSA Information  __X__ Take these medicines the morning of surgery with A SIP OF WATER:     1. albuterol (PROVENTIL HFA;VENTOLIN HFA) 108 (90 Base) MCG/ACT inhaler  2. beclomethasone (QVAR REDIHALER) 80 MCG/ACT inhaler: Use the night before and morning of  your surgery.  3. esomeprazole (NEXIUM) 40 MG capsule: Take the night before and morning of your surgery.  4. metoprolol succinate (TOPROL-XL) 100 MG 24 hr tablet  5. diltiazem (CARDIZEM CD) 180 MG 24 hr capsule: Take if your blood pressure is high morning of surgery.      __X__ Use CHG Soap as directed    _ X___ Use inhalers on the day of surgery. Also bring the inhaler with you to the hospital on the morning of surgery.   __X__ Stop Blood Thinners: Aspirin. Last dose Saturday 10/06/18.   __X__ Stop Anti-inflammatories 7 days before surgery such as Advil, Ibuprofen, Motrin, BC or Goodies Powder, Naprosyn, Naproxen, Aleve, Aspirin, Meloxicam. May take Tylenol if needed for pain or discomfort.    __X__ Do not start taking any new herbal supplements before your surgery.

## 2018-10-06 ENCOUNTER — Ambulatory Visit
Admission: RE | Admit: 2018-10-06 | Discharge: 2018-10-06 | Disposition: A | Discharge status: Home / Self Care | Payer: Medicaid Other | Source: Ambulatory Visit | Attending: Pain Medicine | Admitting: Pain Medicine

## 2018-10-06 DIAGNOSIS — G8929 Other chronic pain: Secondary | ICD-10-CM | POA: Insufficient documentation

## 2018-10-06 DIAGNOSIS — M25562 Pain in left knee: Principal | ICD-10-CM

## 2018-10-06 DIAGNOSIS — M170 Bilateral primary osteoarthritis of knee: Secondary | ICD-10-CM | POA: Insufficient documentation

## 2018-10-06 DIAGNOSIS — M25561 Pain in right knee: Secondary | ICD-10-CM | POA: Diagnosis present

## 2018-10-10 ENCOUNTER — Ambulatory Visit: Payer: Medicaid Other

## 2018-10-10 ENCOUNTER — Ambulatory Visit: Payer: Medicaid Other | Attending: Nurse Practitioner | Admitting: Nurse Practitioner

## 2018-10-10 ENCOUNTER — Encounter: Payer: Self-pay | Admitting: Nurse Practitioner

## 2018-10-10 VITALS — BP 97/70 | HR 109 | Temp 98.2°F | Resp 16 | Ht 64.0 in | Wt 341.0 lb

## 2018-10-10 DIAGNOSIS — M47816 Spondylosis without myelopathy or radiculopathy, lumbar region: Secondary | ICD-10-CM

## 2018-10-10 DIAGNOSIS — G8929 Other chronic pain: Secondary | ICD-10-CM

## 2018-10-10 DIAGNOSIS — M792 Neuralgia and neuritis, unspecified: Secondary | ICD-10-CM

## 2018-10-10 DIAGNOSIS — M170 Bilateral primary osteoarthritis of knee: Secondary | ICD-10-CM | POA: Diagnosis not present

## 2018-10-10 DIAGNOSIS — M25511 Pain in right shoulder: Secondary | ICD-10-CM | POA: Insufficient documentation

## 2018-10-10 DIAGNOSIS — M25512 Pain in left shoulder: Secondary | ICD-10-CM | POA: Diagnosis present

## 2018-10-10 MED ORDER — CEFAZOLIN SODIUM-DEXTROSE 2-4 GM/100ML-% IV SOLN
2.0000 g | INTRAVENOUS | Status: AC
  Administered 2018-10-11: 2 g via INTRAVENOUS

## 2018-10-10 MED ORDER — HYDROCODONE-ACETAMINOPHEN 5-325 MG PO TABS: 1.0000 | ORAL_TABLET | Freq: Three times a day (TID) | ORAL | 0 refills | Status: DC | PRN

## 2018-10-10 NOTE — Patient Instructions (Signed)
____________________________________________________________________________________________  Medication Rules  Purpose: To inform patients, and their family members, of our rules and regulations.  Applies to: All patients receiving prescriptions (written or electronic).  Pharmacy of record: Pharmacy where electronic prescriptions will be sent. If written prescriptions are taken to a different pharmacy, please inform the nursing staff. The pharmacy listed in the electronic medical record should be the one where you would like electronic prescriptions to be sent.  Electronic prescriptions: In compliance with the [place] Strengthen Opioid Misuse Prevention (STOP) Act of 2017 (Session Law 2017-74/H243), effective September 12, 2018, all controlled substances must be electronically prescribed. Calling prescriptions to the pharmacy will cease to exist.  Prescription refills: Only during scheduled appointments. Applies to all prescriptions.  NOTE: The following applies primarily to controlled substances (Opioid* Pain Medications).   Patient's responsibilities: 1. Pain Pills: Bring all pain pills to every appointment (except for procedure appointments). 2. Pill Bottles: Bring pills in original pharmacy bottle. Always bring the newest bottle. Bring bottle, even if empty. 3. Medication refills: You are responsible for knowing and keeping track of what medications you take and those you need refilled. The day before your appointment: write a list of all prescriptions that need to be refilled. The day of the appointment: give the list to the admitting nurse. Prescriptions will be written only during appointments. If you forget a medication: it will not be "Called in", "Faxed", or "electronically sent". You will need to get another appointment to get these prescribed. No early refills. Do not call asking to have your prescription filled early. 4. Prescription Accuracy: You are responsible for  carefully inspecting your prescriptions before leaving our office. Have the discharge nurse carefully go over each prescription with you, before taking them home. Make sure that your name is accurately spelled, that your address is correct. Check the name and dose of your medication to make sure it is accurate. Check the number of pills, and the written instructions to make sure they are clear and accurate. Make sure that you are given enough medication to last until your next medication refill appointment. 5. Taking Medication: Take medication as prescribed. When it comes to controlled substances, taking less pills or less frequently than prescribed is permitted and encouraged. Never take more pills than instructed. Never take medication more frequently than prescribed.  6. Inform other Doctors: Always inform, all of your healthcare providers, of all the medications you take. 7. Pain Medication from other Providers: You are not allowed to accept any additional pain medication from any other Doctor or Healthcare provider. There are two exceptions to this rule. (see below) In the event that you require additional pain medication, you are responsible for notifying us, as stated below. 8. Medication Agreement: You are responsible for carefully reading and following our Medication Agreement. This must be signed before receiving any prescriptions from our practice. Safely store a copy of your signed Agreement. Violations to the Agreement will result in no further prescriptions. (Additional copies of our Medication Agreement are available upon request.) 9. Laws, Rules, & Regulations: All patients are expected to follow all Federal and State Laws, Statutes, Rules, & Regulations. Ignorance of the Laws does not constitute a valid excuse. The use of any illegal substances is prohibited. 10. Adopted CDC guidelines & recommendations: Target dosing levels will be at or below 60 MME/day. Use of benzodiazepines** is not  recommended.  Exceptions: There are only two exceptions to the rule of not receiving pain medications from other Healthcare Providers. 1.   Exception #1 (Emergencies): In the event of an emergency (i.e.: accident requiring emergency care), you are allowed to receive additional pain medication. However, you are responsible for: As soon as you are able, call our office [phone], at any time of the day or night, and leave a message stating your name, the date and nature of the emergency, and the name and dose of the medication prescribed. In the event that your call is answered by a member of our staff, make sure to document and save the date, time, and the name of the person that took your information.  2. Exception #2 (Planned Surgery): In the event that you are scheduled by another doctor or dentist to have any type of surgery or procedure, you are allowed (for a period no longer than 30 days), to receive additional pain medication, for the acute post-op pain. However, in this case, you are responsible for picking up a copy of our "Post-op Pain Management for Surgeons" handout, and giving it to your surgeon or dentist. This document is available at our office, and does not require an appointment to obtain it. Simply go to our office during business hours (Monday-Thursday from 8:00 AM to 4:00 PM) (Friday 8:00 AM to 12:00 Noon) or if you have a scheduled appointment with [place], prior to your surgery, and ask for it by name. In addition, you will need to provide [place] with your name, name of your surgeon, type of surgery, and date of procedure or surgery.  *Opioid medications include: morphine, codeine, oxycodone, oxymorphone, hydrocodone, hydromorphone, meperidine, tramadol, tapentadol, buprenorphine, fentanyl, methadone. **Benzodiazepine medications include: diazepam (Valium), alprazolam (Xanax), clonazepam (Klonopine), lorazepam (Ativan), clorazepate (Tranxene), chlordiazepoxide (Librium), estazolam (Prosom),  oxazepam (Serax), temazepam (Restoril), triazolam (Halcion) (Last updated: 11/09/2017) ____________________________________________________________________________________________    BMI Assessment: Estimated body mass index is 58.53 kg/m as calculated from the following:   Height as of this encounter: 5\' 4"  (1.626 m).   Weight as of this encounter: 341 lb (154.7 kg).  BMI interpretation table: BMI level Category Range association with higher incidence of chronic pain  <18 kg/m2 Underweight   18.5-24.9 kg/m2 Ideal body weight   25-29.9 kg/m2 Overweight Increased incidence by 20%  30-34.9 kg/m2 Obese (Class I) Increased incidence by 68%  35-39.9 kg/m2 Severe obesity (Class II) Increased incidence by 136%  >40 kg/m2 Extreme obesity (Class III) Increased incidence by 254%   Patient's current BMI Ideal Body weight  Body mass index is 58.53 kg/m. Ideal body weight: 54.7 kg (120 lb 9.5 oz) Adjusted ideal body weight: 94.7 kg (208 lb 12.1 oz)   BMI Readings from Last 4 Encounters:  10/10/18 58.53 kg/m  10/03/18 58.55 kg/m  09/27/18 59.22 kg/m  09/13/18 59.22 kg/m   Wt Readings from Last 4 Encounters:  10/10/18 (!) 341 lb (154.7 kg)  10/03/18 (!) 341 lb 1.6 oz (154.7 kg)  09/27/18 (!) 345 lb (156.5 kg)  09/13/18 (!) 345 lb (156.5 kg)

## 2018-10-10 NOTE — Progress Notes (Signed)
Nursing Pain Medication Assessment:  Safety precautions to be maintained throughout the outpatient stay will include: orient to surroundings, keep bed in low position, maintain call bell within reach at all times, provide assistance with transfer out of bed and ambulation.  Medication Inspection Compliance: Pill count conducted under aseptic conditions, in front of the patient. Neither the pills nor the bottle was removed from the patient's sight at any time. Once count was completed pills were immediately returned to the patient in their original bottle.  Medication: Hydrocodone/APAP Pill/Patch Count: 9 of 90 pills remain Pill/Patch Appearance: Markings consistent with prescribed medication Bottle Appearance: Standard pharmacy container. Clearly labeled. Filled Date: 01 / 02 / 2020 Last Medication intake:  Today

## 2018-10-10 NOTE — Progress Notes (Signed)
Patient's Name: Lauren [name]  MRN: [id number]  Referring Provider: Hubbard Hartshorn, FNP  DOB: 08-06-1976  PCP: Hubbard Hartshorn, FNP  DOS: 10/10/2018  Note by: Vevelyn Francois NP  Service setting: Ambulatory outpatient  Specialty: Interventional Pain Management  Location: [facility] (AMB) Pain Management Facility    Patient type: Established    Primary Reason(s) for Visit: Encounter for prescription drug management & post-procedure evaluation of chronic illness with mild to moderate exacerbation(Level of risk: moderate) CC: Neck Pain (bilateral); Wrist Pain (left); Knee Pain (bilateral); and Back Pain (lumbar)  HPI  [name] is a 43 y.o. year old, female patient, who comes today for a post-procedure evaluation and medication management. She has Morbid obesity ([place]); Depression; Chest pain; SVT (supraventricular tachycardia) ([place]); Chronic knee pain (Primary Area of Pain) (Bilateral) (L>R); Chronic daily headache; Chronic pain in right shoulder; Chronic generalized pain; Chronic, continuous use of opioids; HTN (hypertension), benign; Fibromyalgia; Gastro-esophageal reflux disease without esophagitis; Heart palpitations; History of maternal deep vein thrombosis (DVT); Hx of abnormal cervical Pap smear; PTSD (post-traumatic stress disorder); Snoring; Stress incontinence; Transaminitis; Chronic wrist pain (Secondary Area of Pain) (Left); Chronic shoulder pain (Fourth Area of Pain) (Bilateral) (L>R); 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 (Bilateral); Chronic pain of left upper extremity (R>L); Chronic neck pain (Tertiary Area of Pain) (Bilateral) (L>R); Chronic lower extremity pain (Bilateral); Chronic sacroiliac joint pain; Chronic pain syndrome; Opiate use; Pharmacologic therapy; Disorder of skeletal system; Problems influencing health status; Elevated C-reactive protein (CRP); Elevated sed rate; Chronic musculoskeletal pain; Osteoarthritis of knee (Bilateral); Cervicalgia; Chronic low back pain  (Fifth Area of Pain) (Bilateral) (R>L) w/o sciatica; DDD (degenerative disc disease), lumbar; Morbid obesity with BMI of 50.0-59.9, adult ([place]); Neurogenic pain; Impaired ambulation; Asthma; Lymphadenopathy, cervical; Thrombocytosis ([place]); Leukocytosis; Anemia; Prediabetes; Iron deficiency anemia; Lumbar spondylosis; Chronic knee pain (Primary Area of Pain) (Left); Lipoma of left shoulder; Osteoarthritis of shoulder (Bilateral); and Chronic forearm pain (Left) on their problem list. Her primarily concern today is the Neck Pain (bilateral); Wrist Pain (left); Knee Pain (bilateral); and Back Pain (lumbar)  Pain Assessment: Location: Right, Left Neck(see visit info for additional pain sites. ) Radiating: neck pain into shoulder and shoulder blades, knee pain goes down the back of the leg into the calf,  back pain radiates into the right hip  Onset: More than a month ago Duration: Chronic pain Quality: Discomfort, Constant, Other (Comment), Aching, Sharp, Dull, Throbbing, Tingling, Numbness(knees are grinding and popping when walking ) Severity: 7 /10 (subjective, self-reported pain score)  Note: Reported level is compatible with observation. Clinically the patient looks like a 1/10 A 1/10 is viewed as "Mild" and described as nagging, annoying, but not interfering with basic activities of daily living (ADL). [name] is able to eat, bathe, get dressed, do toileting (being able to get on and off the toilet and perform personal hygiene functions), transfer (move in and out of bed or a chair without assistance), and maintain continence (able to control bladder and bowel functions). Physiologic parameters such as blood pressure and heart rate apear wnl. Information on the proper use of the pain scale provided to the patient today. When using our objective Pain Scale, levels between 6 and 10/10 are said to belong in an emergency room, as it progressively worsens from a 6/10, described as severely limiting,  requiring emergency care not usually available at an outpatient pain management facility. At a 6/10 level, communication becomes difficult and requires great effort. Assistance to reach the  emergency department may be required. Facial flushing and profuse sweating along with potentially dangerous increases in heart rate and blood pressure will be evident. Effect on ADL: limited ROM on the left neck, sleep disruption, knees are buckling sometimes when walking.  Timing: Constant Modifying factors: heat, ice, massage and medications,  procedures, rest  BP: 97/70  HR: (!) 109  [name] was last seen on 09/28/2018 for a procedure. During today's appointment we reviewed Ms. Brubeck's post-procedure results, as well as her outpatient medication regimen.  Further details on both, my assessment(s), as well as the proposed treatment plan, please see below.  Controlled Substance Pharmacotherapy Assessment REMS (Risk Evaluation and Mitigation Strategy)  Analgesic:Hydrocodone/acetaminophen 5/325 1 tablet 3 times daily MME/day:81m/day.   PJanett Billow RN  10/10/2018  8:40 AM  Sign when Signing Visit Nursing Pain Medication Assessment:  Safety precautions to be maintained throughout the outpatient stay will include: orient to surroundings, keep bed in low position, maintain call bell within reach at all times, provide assistance with transfer out of bed and ambulation.  Medication Inspection Compliance: Pill count conducted under aseptic conditions, in front of the patient. Neither the pills nor the bottle was removed from the patient's sight at any time. Once count was completed pills were immediately returned to the patient in their original bottle.  Medication: Hydrocodone/APAP Pill/Patch Count: 9 of 90 pills remain Pill/Patch Appearance: Markings consistent with prescribed medication Bottle Appearance: Standard pharmacy container. Clearly labeled. Filled Date: 01 / 02 / 2020 Last  Medication intake:  Today   Pharmacokinetics: Liberation and absorption (onset of action): WNL Distribution (time to peak effect): WNL Metabolism and excretion (duration of action): WNL         Pharmacodynamics: Desired effects: Analgesia: Ms. Tatar reports >50% benefit. Functional ability: Patient reports that medication allows her to accomplish basic ADLs Clinically meaningful improvement in function (CMIF): Sustained CMIF goals met Perceived effectiveness: Described as relatively effective, allowing for increase in activities of daily living (ADL) Undesirable effects: Side-effects or Adverse reactions: None reported Monitoring: [place] PMP: Online review of the past 181-montheriod conducted. Compliant with practice rules and regulations Last UDS on record: Summary  Date Value Ref Range Status  09/13/2018 FINAL  Final    Comment:    ==================================================================== TOXASSURE SELECT 13 (MW) ==================================================================== Test                             Result       Flag       Units Drug Present and Declared for Prescription Verification   Hydrocodone                    324          EXPECTED   ng/mg creat   Hydromorphone                  78           EXPECTED   ng/mg creat   Dihydrocodeine                 176          EXPECTED   ng/mg creat   Norhydrocodone                 493          EXPECTED   ng/mg creat    Sources of hydrocodone include scheduled prescription    medications.  Hydromorphone, dihydrocodeine and norhydrocodone are    expected metabolites of hydrocodone. Hydromorphone and    dihydrocodeine are also available as scheduled prescription    medications. ==================================================================== Test                      Result    Flag   Units      Ref Range   Creatinine              190              mg/dL       >=20 ==================================================================== Declared Medications:  The flagging and interpretation on this report are based on the  following declared medications.  Unexpected results may arise from  inaccuracies in the declared medications.  **Note: The testing scope of this panel includes these medications:  Hydrocodone (Norco)  Hydrocodone (Vicodin)  **Note: The testing scope of this panel does not include following  reported medications:  Acetaminophen (Norco)  Acetaminophen (Tylenol)  Acetaminophen (Vicodin)  Albuterol  Aspirin (Aspirin 81)  Beclomethasone (QVAR)  Diltiazem (Cardizem)  Fluticasone (Flovent)  Furosemide (Lasix)  Gabapentin  Hydrochlorothiazide (Hyzaar)  Ibuprofen (Advil)  Losartan (Hyzaar)  Metformin  Metoprolol (Toprol)  Olodaterol (Stiolto)  Omeprazole (Nexium)  Tiotropium (Stiolto)  Tizanidine (Zanaflex) ==================================================================== For clinical consultation, please call [phone]. ====================================================================    UDS interpretation: Compliant          Medication Assessment Form: Reviewed. Patient indicates being compliant with therapy Treatment compliance: Compliant  Opioid Risk Tool - 10/10/18 0834      Psychological Disease   Psychological Disease  Negative    Depression  Negative      Total Score   Opioid Risk Tool Scoring  0    Opioid Risk Interpretation  Low Risk      ORT Scoring interpretation table:  Score <3 = Low Risk for SUD  Score between 4-7 = Moderate Risk for SUD  Score >8 = High Risk for Opioid Abuse   Risk Mitigation Strategies:  Patient Counseling: Covered Patient-Prescriber Agreement (PPA): Present and active  Notification to other healthcare providers: Done  Pharmacologic Plan: No change in therapy, at this time.             Post-Procedure Assessment  09/27/2018 Procedure: Bilateral intra-articular  shoulder injections and left wrist injection Pre-procedure pain score:  3/10 Post-procedure pain score: 0/10         Influential Factors: BMI: 58.53 kg/m Intra-procedural challenges: None observed.         Assessment challenges: None detected.              Reported side-effects: None.        Post-procedural adverse reactions or complications: None reported         Sedation: Please see nurses note. When no sedatives are used, the analgesic levels obtained are directly associated to the effectiveness of the local anesthetics. However, when sedation is provided, the level of analgesia obtained during the initial 1 hour following the intervention, is believed to be the result of a combination of factors. These factors may include, but are not limited to: 1. The effectiveness of the local anesthetics used. 2. The effects of the analgesic(s) and/or anxiolytic(s) used. 3. The degree of discomfort experienced by the patient at the time of the procedure. 4. The patients ability and reliability in recalling and recording the events. 5. The presence and influence of possible secondary gains and/or psychosocial factors. Reported result: Relief experienced  during the 1st hour after the procedure: 100 % (Ultra-Short Term Relief)            Interpretative annotation: Clinically appropriate result. Analgesia during this period is likely to be Local Anesthetic and/or IV Sedative (Analgesic/Anxiolytic) related.          Effects of local anesthetic: The analgesic effects attained during this period are directly associated to the localized infiltration of local anesthetics and therefore cary significant diagnostic value as to the etiological location, or anatomical origin, of the pain. Expected duration of relief is directly dependent on the pharmacodynamics of the local anesthetic used. Long-acting (4-6 hours) anesthetics used.  Reported result: Relief during the next 4 to 6 hour after the procedure: 100 %  (Short-Term Relief)            Interpretative annotation: Clinically appropriate result. Analgesia during this period is likely to be Local Anesthetic-related.          Long-term benefit: Defined as the period of time past the expected duration of local anesthetics (1 hour for short-acting and 4-6 hours for long-acting). With the possible exception of prolonged sympathetic blockade from the local anesthetics, benefits during this period are typically attributed to, or associated with, other factors such as analgesic sensory neuropraxia, antiinflammatory effects, or beneficial biochemical changes provided by agents other than the local anesthetics.  Reported result: Extended relief following procedure: 0 %(numbness began to wear off approx 4 - 5 hours later) (Long-Term Relief)            Interpretative annotation: Clinically possible results. Good relief. No permanent benefit expected. Inflammation plays a part in the etiology to the pain.          Current benefits: Defined as reported results that persistent at this point in time.   Analgesia: <50 %            Function: Back to baseline ROM: Back to baseline Interpretative annotation: Recurrence of symptoms. No permanent benefit expected. Effective diagnostic intervention.          Interpretation: Results would suggest a successful diagnostic intervention.                  Plan:  Please see "Plan of Care" for details.                Laboratory Chemistry  Inflammation Markers (CRP: Acute Phase) (ESR: Chronic Phase) Lab Results  Component Value Date   CRP 46 (H) 08/16/2018   ESRSEDRATE 111 (H) 08/16/2018                         Rheumatology Markers Lab Results  Component Value Date   LABURIC 4.3 09/05/2017                        Renal Function Markers Lab Results  Component Value Date   BUN 9 08/02/2018   CREATININE 0.78 32/54/9826   BCR NOT APPLICABLE 41/58/3094   GFRAA 110 08/02/2018   GFRNONAA 95 08/02/2018                              Hepatic Function Markers Lab Results  Component Value Date   AST 21 07/12/2018   ALT 25 07/12/2018   ALBUMIN 3.9 06/25/2018   ALKPHOS 58 06/25/2018   LIPASE 27 09/05/2017  Electrolytes Lab Results  Component Value Date   NA 139 08/02/2018   K 4.0 08/02/2018   CL 103 08/02/2018   CALCIUM 9.6 08/02/2018   MG 1.7 08/02/2018                        Neuropathy Markers Lab Results  Component Value Date   YKZLDJTT01 779 03/14/2018   HGBA1C 6.3 (H) 05/10/2018                        CNS Tests No results found for: COLORCSF, APPEARCSF, RBCCOUNTCSF, WBCCSF, POLYSCSF, LYMPHSCSF, EOSCSF, PROTEINCSF, GLUCCSF, JCVIRUS, CSFOLI, IGGCSF                      Bone Pathology Markers Lab Results  Component Value Date   25OHVITD1 32 03/14/2018   25OHVITD2 <1.0 03/14/2018   25OHVITD3 32 03/14/2018                         Coagulation Parameters Lab Results  Component Value Date   PLT 459 (H) 07/19/2018                        Cardiovascular Markers Lab Results  Component Value Date   BNP 32.0 06/25/2018   CKTOTAL 119 01/20/2012   CKMB < 0.5 (L) 01/20/2012   TROPONINI <0.03 06/25/2018   HGB 9.8 (L) 07/19/2018   HCT 33.1 (L) 07/19/2018                         CA Markers No results found for: CEA, CA125, LABCA2                      Note: Lab results reviewed.  Recent Diagnostic Imaging Results  MR KNEE RIGHT WO CONTRAST CLINICAL DATA:  Arthritis and chronic knee pain.  EXAM: MRI OF THE RIGHT KNEE WITHOUT CONTRAST  TECHNIQUE: Multiplanar, multisequence MR imaging of the knee was performed. No intravenous contrast was administered.  COMPARISON:  None.  FINDINGS: This study is limited by patient body habitus.  MENISCI  Medial meniscus: Mucoid degeneration of the posterior horn without definite surfacing tear identified.  Lateral meniscus: Slightly discoid lateral meniscus without tear, series 17/7.  LIGAMENTS  Cruciates:   Intact  Collaterals:  Intact  CARTILAGE  Patellofemoral: Fissuring of the cartilage overlying the lateral patellar facet and median ridge, full-thickness given subchondral cystic change and erosions noted of the patella.  Medial: Mild partial-thickness cartilage loss of the medial femorotibial compartment.  Lateral:  No chondral defect.  Joint:  No joint effusion. Normal Hoffa's fat. No plical thickening.  Popliteal Fossa:  No Baker cyst. Intact popliteus tendon.  Extensor Mechanism:  Intact quadriceps tendon and patellar tendon.  Bones: No focal marrow signal abnormality. No fracture or dislocation.  Other: None  IMPRESSION: 1. Slightly discoid lateral meniscus without tear. Mucoid degeneration of the posterior horn of the medial meniscus without surfacing tear identified though fine detail is limited due to patient body habitus. 2. Fissuring of the patellar cartilage with subchondral cystic change. 3. Intact cruciate and collateral ligaments.  Electronically Signed   By: Ashley Royalty M.D.   On: 10/06/2018 20:43 MR KNEE LEFT WO CONTRAST CLINICAL DATA:  Bilateral knee pain status post multiple falls with clicking, popping and swelling. Arthritis.  EXAM: MRI OF THE LEFT KNEE WITHOUT  CONTRAST  TECHNIQUE: Multiplanar, multisequence MR imaging of the knee was performed. No intravenous contrast was administered.  COMPARISON:  Radiographs from 04/05/2012  FINDINGS: This study is limited by patient body habitus.  MENISCI  Medial meniscus:  Intact  Lateral meniscus:  Intact  LIGAMENTS  Cruciates:  Intact ACL and PCL.  Collaterals: Medial collateral ligament is intact. Lateral collateral ligament complex is intact.  CARTILAGE  Patellofemoral:  No chondral defect.  Medial: Mild partial-thickness cartilage loss of the medial femorotibial compartment.  Lateral: Suspect full-thickness chondral fissuring secondary to subchondral cystic change along  posterior aspect of the lateral tibial plateau.  Joint:  No joint effusion. Normal Hoffa's fat. No plical thickening.  Popliteal Fossa: No Baker cyst. Intact popliteus tendon. Small cysts likely representing ganglia are identified adjacent to the PCL.  Extensor Mechanism:  Intact quadriceps tendon and patellar tendon.  Bones: Subchondral edema and cystic change deep to the lateral tibial plateau posteriorly.  Other: None  IMPRESSION: 1. Subchondral cystic change of the lateral tibial plateau likely representing areas of full-thickness chondral loss and or fissuring. 2. No apparent meniscal, cruciate or collateral ligament tear given limitations due to patient body habitus. 3. Small ganglion cysts are identified adjacent to the PCL.  Electronically Signed   By: Ashley Royalty M.D.   On: 10/06/2018 20:38  Complexity Note: Imaging results reviewed. Results shared with Ms. Pe, using Layman's terms.                         Meds   Current Outpatient Medications:  .  albuterol (PROVENTIL HFA;VENTOLIN HFA) 108 (90 Base) MCG/ACT inhaler, Inhale 2 puffs into the lungs every 6 (six) hours as needed for wheezing or shortness of breath., Disp: 1 Inhaler, Rfl: 0 .  aspirin EC 81 MG tablet, Take 81 mg by mouth daily., Disp: , Rfl:  .  beclomethasone (QVAR REDIHALER) 80 MCG/ACT inhaler, Inhale 1 puff into the lungs 2 (two) times daily. (Patient taking differently: Inhale 1 puff into the lungs 2 (two) times daily as needed (shortness of breath). ), Disp: 1 Inhaler, Rfl: 2 .  diltiazem (CARDIZEM CD) 180 MG 24 hr capsule, Take 1 capsule by mouth every other day. , Disp: , Rfl:  .  esomeprazole (NEXIUM) 40 MG capsule, Take 1 capsule (40 mg total) by mouth daily at 12 noon. (Patient taking differently: Take 40 mg by mouth daily as needed (acid reflux). ), Disp: 90 capsule, Rfl: 1 .  fluticasone (FLOVENT HFA) 220 MCG/ACT inhaler, Inhale 2 puffs into the lungs 2 (two) times daily. (Patient taking  differently: Inhale 2 puffs into the lungs 2 (two) times daily as needed (shortness of breath). ), Disp: 1 Inhaler, Rfl: 2 .  furosemide (LASIX) 40 MG tablet, Take 40 mg by mouth 3 (three) times a week. , Disp: , Rfl: 5 .  [START ON 10/13/2018] HYDROcodone-acetaminophen (NORCO) 5-325 MG tablet, Take 1 tablet by mouth every 8 (eight) hours as needed for up to 30 days for moderate pain., Disp: 90 tablet, Rfl: 0 .  ibuprofen (ADVIL,MOTRIN) 600 MG tablet, Take 1 tablet (600 mg total) by mouth every 8 (eight) hours as needed., Disp: 30 tablet, Rfl: 0 .  losartan-hydrochlorothiazide (HYZAAR) 50-12.5 MG tablet, Take 1 tablet by mouth daily., Disp: 90 tablet, Rfl: 3 .  metoprolol succinate (TOPROL-XL) 100 MG 24 hr tablet, Take 100 mg by mouth daily. Take with or immediately following a meal., Disp: , Rfl:  .  STIOLTO RESPIMAT  2.5-2.5 MCG/ACT AERS, Inhale 2 puffs into the lungs daily. (Patient taking differently: Inhale 2 puffs into the lungs daily as needed (shortness of breath). ), Disp: 1 Inhaler, Rfl: 0 .  gabapentin (NEURONTIN) 300 MG capsule, Take 1 capsule (300 mg total) by mouth 3 (three) times daily. (Patient taking differently: Take 300 mg by mouth daily as needed (neuropathy). ), Disp: 90 capsule, Rfl: 0 .  [START ON 11/12/2018] HYDROcodone-acetaminophen (NORCO/VICODIN) 5-325 MG tablet, Take 1 tablet by mouth every 8 (eight) hours as needed for up to 30 days for moderate pain., Disp: 90 tablet, Rfl: 0  ROS  Constitutional: Denies any fever or chills Gastrointestinal: No reported hemesis, hematochezia, vomiting, or acute GI distress Musculoskeletal: Denies any acute onset joint swelling, redness, loss of ROM, or weakness Neurological: No reported episodes of acute onset apraxia, aphasia, dysarthria, agnosia, amnesia, paralysis, loss of coordination, or loss of consciousness  Allergies  Ms. Valade is allergic to nucynta [tapentadol] and tape.  PFSH  Drug: Ms. Krul  reports no history of drug  use. Alcohol:  reports no history of alcohol use. Tobacco:  reports that she has never smoked. She has never used smokeless tobacco. Medical:  has a past medical history of Advanced maternal age in multigravida, Asthma, Depression, GERD (gastroesophageal reflux disease), Grand multiparity, Headache, History of carpal tunnel surgery of left wrist (05/2018), Hypertension, Leg fracture, Obesity, morbid, BMI 40.0-49.9 ([place]), Post traumatic stress disorder, Pre-diabetes, Pre-eclampsia, SVT (supraventricular tachycardia) ([place]), and Wrist fracture, closed. Surgical: Ms. Hedstrom  has a past surgical history that includes Rotator cuff repair; Knee surgery; Cholecystectomy; Cesarean section (2010); Cesarean section (2011); Tonsillectomy; and Cardiac electrophysiology study and ablation (01/18/2018). Family: family history includes Coronary artery disease in her brother; Diabetes in her brother, father, mother, sister, sister, sister, sister, sister, and sister; Hypertension in her brother and brother; Migraines in her father, maternal aunt, maternal uncle, paternal aunt, and paternal uncle; Ovarian cancer in her cousin; Stomach cancer in her sister; Throat cancer in her maternal aunt.  Constitutional Exam  General appearance: Well nourished, well developed, and well hydrated. In no apparent acute distress Vitals:   10/10/18 0817  BP: 97/70  Pulse: (!) 109  Resp: 16  Temp: 98.2 F (36.8 C)  TempSrc: Oral  SpO2: 100%  Weight: (!) 341 lb (154.7 kg)  Height: _0  (1.626 m)  Psych/Mental status: Alert, oriented x 3 (person, place, & time)       Eyes: PERLA Respiratory: No evidence of acute respiratory distress  Cervical Spine Area Exam  Skin & Axial Inspection: No masses, redness, edema, swelling, or associated skin lesions Alignment: Symmetrical Functional ROM: Unrestricted ROM      Stability: No instability detected Muscle Tone/Strength: Functionally intact. No obvious neuro-muscular anomalies  detected. Sensory (Neurological): Unimpaired Palpation: No palpable anomalies              Upper Extremity (UE) Exam    Side: Right upper extremity  Side: Left upper extremity  Skin & Extremity Inspection: Skin color, temperature, and hair growth are WNL. No peripheral edema or cyanosis. No masses, redness, swelling, asymmetry, or associated skin lesions. No contractures.  Skin & Extremity Inspection: edema with mass between clavicle and shoulder  Functional ROM: Unrestricted ROM          Functional ROM: Decreased ROM          Muscle Tone/Strength: Functionally intact. No obvious neuro-muscular anomalies detected.  Muscle Tone/Strength: Functionally intact. No obvious neuro-muscular anomalies detected.  Sensory (Neurological): Unimpaired  Sensory (Neurological): Unimpaired          Palpation: No palpable anomalies              Palpation: Complains of area being tender to palpation               Thoracic Spine Area Exam  Skin & Axial Inspection: No masses, redness, or swelling Alignment: Symmetrical Functional ROM: Unrestricted ROM Stability: No instability detected Muscle Tone/Strength: Functionally intact. No obvious neuro-muscular anomalies detected. Sensory (Neurological): Unimpaired Muscle strength & Tone: No palpable anomalies  Lumbar Spine Area Exam  Skin & Axial Inspection: No masses, redness, or swelling Alignment: Symmetrical Functional ROM: Unrestricted ROM       Stability: No instability detected Muscle Tone/Strength: Functionally intact. No obvious neuro-muscular anomalies detected. Sensory (Neurological): Unimpaired Palpation: No palpable anomalies         Gait & Posture Assessment  Ambulation: Unassisted Gait: Relatively normal for age and body habitus Posture: WNL   Lower Extremity Exam    Side: Right lower extremity  Side: Left lower extremity  Stability: No instability observed          Stability: No instability observed          Skin & Extremity  Inspection: Skin color, temperature, and hair growth are WNL. No peripheral edema or cyanosis. No masses, redness, swelling, asymmetry, or associated skin lesions. No contractures.  Skin & Extremity Inspection: Skin color, temperature, and hair growth are WNL. No peripheral edema or cyanosis. No masses, redness, swelling, asymmetry, or associated skin lesions. No contractures.  Functional ROM: Unrestricted ROM                  Functional ROM: Unrestricted ROM                  Muscle Tone/Strength: Functionally intact. No obvious neuro-muscular anomalies detected.  Muscle Tone/Strength: Functionally intact. No obvious neuro-muscular anomalies detected.  Sensory (Neurological): Unimpaired        Sensory (Neurological): Unimpaired            Palpation: No palpable anomalies  Palpation: No palpable anomalies   Assessment  Primary Diagnosis & Pertinent Problem List: The primary encounter diagnosis was Chronic shoulder pain (Fourth Area of Pain) (Bilateral) (L>R). Diagnoses of Osteoarthritis of knee (Bilateral), Lumbar spondylosis, and Neurogenic pain were also pertinent to this visit.  Status Diagnosis  Persistent Persistent Persistent 1. Chronic shoulder pain (Fourth Area of Pain) (Bilateral) (L>R)   2. Osteoarthritis of knee (Bilateral)   3. Lumbar spondylosis   4. Neurogenic pain     Problems updated and reviewed during this visit: Problem  Chronic Pain Syndrome   Plan of Care  Pharmacotherapy (Medications Ordered): Meds ordered this encounter  Medications  . HYDROcodone-acetaminophen (NORCO) 5-325 MG tablet    Sig: Take 1 tablet by mouth every 8 (eight) hours as needed for up to 30 days for moderate pain.    Dispense:  90 tablet    Refill:  0    Do not place this medication, or any other prescription from our practice, on "Automatic Refill". Patient may have prescription filled one day early if pharmacy is closed on scheduled refill date.    Order Specific Question:   Supervising  Provider    Answer:   Milinda Pointer [phone]  . HYDROcodone-acetaminophen (NORCO/VICODIN) 5-325 MG tablet    Sig: Take 1 tablet by mouth every 8 (eight) hours as needed for up to 30 days for moderate pain.  Dispense:  90 tablet    Refill:  0    Do not place this medication, or any other prescription from our practice, on "Automatic Refill". Patient may have prescription filled one day early if pharmacy is closed on scheduled refill date.    Order Specific Question:   Supervising Provider    Answer:   Milinda Pointer [phone]   New Prescriptions   HYDROCODONE-ACETAMINOPHEN (NORCO/VICODIN) 5-325 MG TABLET    Take 1 tablet by mouth every 8 (eight) hours as needed for up to 30 days for moderate pain.   Medications administered today: Genie N. Brakebill had no medications administered during this visit. Lab-work, procedure(s), and/or referral(s): No orders of the defined types were placed in this encounter.  Imaging and/or referral(s): None  Interventional management options: Planned, scheduled, and/or pending NOTE:NO RFAuntil depression brings her BMIbelow 35. Not at this time.  Follow-up as scheduled to discuss MRI   Considering: Diagnostic left knee genicular nerve block Diagnostic left intra-articular wrist injection Diagnostic bilateral cervical facet nerve block Diagnostic bilateral cervical epidural steroid injection Possible bilateral cervical facet radiofrequency ablation Diagnostic left intra-articular shoulder injection Diagnostic bilateral suprascapular nerve block Possible bilateral suprascapular nerve radiofrequency ablation Diagnostic bilateral lumbar epidural steroid injection Diagnostic bilateral lumbar facet nerve block Possible bilateral lumbar facet radiofrequency ablation   Palliative PRN treatment(s): Palliative bilateral intra-articular Hyalgan knee injection series #2under fluoroscopic guidance    Provider-requested  follow-up: Return in about 2 months (around 12/09/2018) for MedMgmt, Appointment As Scheduled, w/ Dr. Dossie Arbour.  Future Appointments  Date Time Provider [place]  10/24/2018  2:00 PM Milinda Pointer, MD [facility] None  11/02/2018  2:00 PM Hubbard Hartshorn, FNP [place] [facility]  11/06/2018  1:00 PM [facility] None  11/06/2018  1:15 PM Lloyd Huger, MD [facility] None  11/06/2018  1:45 PM [facility] INFUSION CHAIR 1 [facility] None  12/10/2018  2:30 PM Vevelyn Francois, NP [facility] None  05/13/2019  3:00 PM Lawhorn, Lara Mulch, CNM [facility] None   Primary Care Physician: Hubbard Hartshorn, FNP Location: [facility] Note by: Vevelyn Francois NP Date: 10/10/2018; Time: 7:14 PM  Pain Score Disclaimer: We use the NRS-11 scale. This is a self-reported, subjective measurement of pain severity with only modest accuracy. It is used primarily to identify changes within a particular patient. It must be understood that outpatient pain scales are significantly less accurate that those used for research, where they can be applied under ideal controlled circumstances with minimal exposure to variables. In reality, the score is likely to be a combination of pain intensity and pain affect, where pain affect describes the degree of emotional arousal or changes in action readiness caused by the sensory experience of pain. Factors such as social and work situation, setting, emotional state, anxiety levels, expectation, and prior pain experience may influence pain perception and show large inter-individual differences that may also be affected by time variables.  Patient instructions provided during this appointment: Patient Instructions   ____________________________________________________________________________________________  Medication Rules  Purpose: To inform patients, and their family members, of our rules and regulations.  Applies to: All patients  receiving prescriptions (written or electronic).  Pharmacy of record: Pharmacy where electronic prescriptions will be sent. If written prescriptions are taken to a different pharmacy, please inform the nursing staff. The pharmacy listed in the electronic medical record should be the one where you would like electronic prescriptions to be sent.  Electronic prescriptions: In compliance with the [facility]  Opioid Misuse Prevention (STOP) Act of 2017 (Session Law 2017-74/H243), effective September 12, 2018, all controlled substances must be electronically prescribed. Calling prescriptions to the pharmacy will cease to exist.  Prescription refills: Only during scheduled appointments. Applies to all prescriptions.  NOTE: The following applies primarily to controlled substances (Opioid* Pain Medications).   Patient's responsibilities: 1. Pain Pills: Bring all pain pills to every appointment (except for procedure appointments). 2. Pill Bottles: Bring pills in original pharmacy bottle. Always bring the newest bottle. Bring bottle, even if empty. 3. Medication refills: You are responsible for knowing and keeping track of what medications you take and those you need refilled. The day before your appointment: write a list of all prescriptions that need to be refilled. The day of the appointment: give the list to the admitting nurse. Prescriptions will be written only during appointments. If you forget a medication: it will not be "Called in", "Faxed", or "electronically sent". You will need to get another appointment to get these prescribed. No early refills. Do not call asking to have your prescription filled early. 4. Prescription Accuracy: You are responsible for carefully inspecting your prescriptions before leaving our office. Have the discharge nurse carefully go over each prescription with you, before taking them home. Make sure that your name is accurately spelled, that your address is  correct. Check the name and dose of your medication to make sure it is accurate. Check the number of pills, and the written instructions to make sure they are clear and accurate. Make sure that you are given enough medication to last until your next medication refill appointment. 5. Taking Medication: Take medication as prescribed. When it comes to controlled substances, taking less pills or less frequently than prescribed is permitted and encouraged. Never take more pills than instructed. Never take medication more frequently than prescribed.  6. Inform other Doctors: Always inform, all of your healthcare providers, of all the medications you take. 7. Pain Medication from other Providers: You are not allowed to accept any additional pain medication from any other Doctor or Healthcare provider. There are two exceptions to this rule. (see below) In the event that you require additional pain medication, you are responsible for notifying us, as stated below. 8. Medication Agreement: You are responsible for carefully reading and following our Medication Agreement. This must be signed before receiving any prescriptions from our practice. Safely store a copy of your signed Agreement. Violations to the Agreement will result in no further prescriptions. (Additional copies of our Medication Agreement are available upon request.) 9. Laws, Rules, & Regulations: All patients are expected to follow all Federal and [employer], Rules, [employer]. Ignorance of the Laws does not constitute a valid excuse. The use of any illegal substances is prohibited. 10. Adopted CDC guidelines & recommendations: Target dosing levels will be at or below 60 MME/day. Use of benzodiazepines** is not recommended.  Exceptions: There are only two exceptions to the rule of not receiving pain medications from other Healthcare Providers. 1. Exception #1 (Emergencies): In the event of an emergency (i.e.: accident requiring emergency  care), you are allowed to receive additional pain medication. However, you are responsible for: As soon as you are able, call our office [phone], at any time of the day or night, and leave a message stating your name, the date and nature of the emergency, and the name and dose of the medication prescribed. In the event that your call is answered by a member of our staff, make  sure to document and save the date, time, and the name of the person that took your information.  2. Exception #2 (Planned Surgery): In the event that you are scheduled by another doctor or dentist to have any type of surgery or procedure, you are allowed (for a period no longer than 30 days), to receive additional pain medication, for the acute post-op pain. However, in this case, you are responsible for picking up a copy of our "Post-op Pain Management for Surgeons" handout, and giving it to your surgeon or dentist. This document is available at our office, and does not require an appointment to obtain it. Simply go to our office during business hours (Monday-Thursday from 8:00 AM to 4:00 PM) (Friday 8:00 AM to 12:00 Noon) or if you have a scheduled appointment with [place], prior to your surgery, and ask for it by name. In addition, you will need to provide [place] with your name, name of your surgeon, type of surgery, and date of procedure or surgery.  *Opioid medications include: morphine, codeine, oxycodone, oxymorphone, hydrocodone, hydromorphone, meperidine, tramadol, tapentadol, buprenorphine, fentanyl, methadone. **Benzodiazepine medications include: diazepam (Valium), alprazolam (Xanax), clonazepam (Klonopine), lorazepam (Ativan), clorazepate (Tranxene), chlordiazepoxide (Librium), estazolam (Prosom), oxazepam (Serax), temazepam (Restoril), triazolam (Halcion) (Last updated: 11/09/2017) ____________________________________________________________________________________________    BMI Assessment: Estimated body mass index is  58.53 kg/m as calculated from the following:   Height as of this encounter: _0  (1.626 m).   Weight as of this encounter: 341 lb (154.7 kg).  BMI interpretation table: BMI level Category Range association with higher incidence of chronic pain  <18 kg/m2 Underweight   18.5-24.9 kg/m2 Ideal body weight   25-29.9 kg/m2 Overweight Increased incidence by 20%  30-34.9 kg/m2 Obese (Class I) Increased incidence by 68%  35-39.9 kg/m2 Severe obesity (Class II) Increased incidence by 136%  >40 kg/m2 Extreme obesity (Class III) Increased incidence by 254%   Patient's current BMI Ideal Body weight  Body mass index is 58.53 kg/m. Ideal body weight: 54.7 kg (120 lb 9.5 oz) Adjusted ideal body weight: 94.7 kg (208 lb 12.1 oz)   BMI Readings from Last 4 Encounters:  10/10/18 58.53 kg/m  10/03/18 58.55 kg/m  09/27/18 59.22 kg/m  09/13/18 59.22 kg/m   Wt Readings from Last 4 Encounters:  10/10/18 (!) 341 lb (154.7 kg)  10/03/18 (!) 341 lb 1.6 oz (154.7 kg)  09/27/18 (!) 345 lb (156.5 kg)  09/13/18 (!) 345 lb (156.5 kg)

## 2018-10-11 ENCOUNTER — Ambulatory Visit: Payer: Medicaid Other | Admitting: Anesthesiology

## 2018-10-11 ENCOUNTER — Ambulatory Visit
Admission: RE | Admit: 2018-10-11 | Discharge: 2018-10-11 | Disposition: A | Discharge status: Home / Self Care | Payer: Medicaid Other | Attending: Surgery | Admitting: Surgery

## 2018-10-11 ENCOUNTER — Encounter: Payer: Self-pay | Admitting: *Deleted

## 2018-10-11 ENCOUNTER — Telehealth: Payer: Self-pay | Admitting: *Deleted

## 2018-10-11 ENCOUNTER — Encounter
Admission: RE | Disposition: A | Discharge status: Home / Self Care | Payer: Self-pay | Source: Home / Self Care | Attending: Surgery

## 2018-10-11 DIAGNOSIS — Z79899 Other long term (current) drug therapy: Secondary | ICD-10-CM | POA: Diagnosis not present

## 2018-10-11 DIAGNOSIS — E119 Type 2 diabetes mellitus without complications: Secondary | ICD-10-CM | POA: Insufficient documentation

## 2018-10-11 DIAGNOSIS — K219 Gastro-esophageal reflux disease without esophagitis: Secondary | ICD-10-CM | POA: Insufficient documentation

## 2018-10-11 DIAGNOSIS — Z791 Long term (current) use of non-steroidal anti-inflammatories (NSAID): Secondary | ICD-10-CM | POA: Insufficient documentation

## 2018-10-11 DIAGNOSIS — Z6841 Body Mass Index (BMI) 40.0 and over, adult: Secondary | ICD-10-CM | POA: Insufficient documentation

## 2018-10-11 DIAGNOSIS — R221 Localized swelling, mass and lump, neck: Secondary | ICD-10-CM | POA: Insufficient documentation

## 2018-10-11 DIAGNOSIS — Z7982 Long term (current) use of aspirin: Secondary | ICD-10-CM | POA: Insufficient documentation

## 2018-10-11 DIAGNOSIS — I471 Supraventricular tachycardia: Secondary | ICD-10-CM | POA: Insufficient documentation

## 2018-10-11 DIAGNOSIS — I10 Essential (primary) hypertension: Secondary | ICD-10-CM | POA: Insufficient documentation

## 2018-10-11 DIAGNOSIS — J45909 Unspecified asthma, uncomplicated: Secondary | ICD-10-CM | POA: Diagnosis not present

## 2018-10-11 HISTORY — PX: EXCISION MASS NECK: SHX6703

## 2018-10-11 LAB — POCT PREGNANCY, URINE: Preg Test, Ur: NEGATIVE

## 2018-10-11 SURGERY — EXCISION, MASS, NECK
Anesthesia: General | Laterality: Left

## 2018-10-11 MED ORDER — PROPOFOL 10 MG/ML IV BOLUS
INTRAVENOUS | Status: DC | PRN
  Administered 2018-10-11: 150 mg via INTRAVENOUS

## 2018-10-11 MED ORDER — ROCURONIUM BROMIDE 100 MG/10ML IV SOLN
INTRAVENOUS | Status: DC | PRN
  Administered 2018-10-11: 5 mg via INTRAVENOUS
  Administered 2018-10-11: 25 mg via INTRAVENOUS

## 2018-10-11 MED ORDER — ACETAMINOPHEN 325 MG PO TABS: 650.0000 mg | ORAL_TABLET | Freq: Three times a day (TID) | ORAL | 0 refills | Status: AC | PRN

## 2018-10-11 MED ORDER — SUCCINYLCHOLINE CHLORIDE 20 MG/ML IJ SOLN
INTRAMUSCULAR | Status: DC | PRN
  Administered 2018-10-11: 160 mg via INTRAVENOUS

## 2018-10-11 MED ORDER — DEXAMETHASONE SODIUM PHOSPHATE 10 MG/ML IJ SOLN
INTRAMUSCULAR | Status: DC | PRN
  Administered 2018-10-11: 10 mg via INTRAVENOUS

## 2018-10-11 MED ORDER — MIDAZOLAM HCL 2 MG/2ML IJ SOLN
INTRAMUSCULAR | Status: DC | PRN
  Administered 2018-10-11 (×2): 1 mg via INTRAVENOUS

## 2018-10-11 MED ORDER — LACTATED RINGERS IV SOLN
INTRAVENOUS | Status: DC
  Administered 2018-10-11: 08:00:00 via INTRAVENOUS

## 2018-10-11 MED ORDER — FENTANYL CITRATE (PF) 100 MCG/2ML IJ SOLN
INTRAMUSCULAR | Status: AC
  Administered 2018-10-11: 25 ug via INTRAVENOUS
  Filled 2018-10-11: qty 2

## 2018-10-11 MED ORDER — OXYCODONE HCL 5 MG PO TABS: 5.0000 mg | ORAL_TABLET | Freq: Once | ORAL | Status: DC | PRN

## 2018-10-11 MED ORDER — FENTANYL CITRATE (PF) 100 MCG/2ML IJ SOLN
INTRAMUSCULAR | Status: DC | PRN
  Administered 2018-10-11 (×2): 50 ug via INTRAVENOUS

## 2018-10-11 MED ORDER — BUPIVACAINE-EPINEPHRINE 0.5% -1:200000 IJ SOLN
INTRAMUSCULAR | Status: DC | PRN
  Administered 2018-10-11: 5 mL

## 2018-10-11 MED ORDER — FENTANYL CITRATE (PF) 100 MCG/2ML IJ SOLN
25.0000 ug | INTRAMUSCULAR | Status: DC | PRN
  Administered 2018-10-11 (×3): 25 ug via INTRAVENOUS

## 2018-10-11 MED ORDER — SUGAMMADEX SODIUM 200 MG/2ML IV SOLN
INTRAVENOUS | Status: DC | PRN
  Administered 2018-10-11: 200 mg via INTRAVENOUS

## 2018-10-11 MED ORDER — BUPIVACAINE-EPINEPHRINE (PF) 0.5% -1:200000 IJ SOLN
INTRAMUSCULAR | Status: AC
  Filled 2018-10-11: qty 90

## 2018-10-11 MED ORDER — FENTANYL CITRATE (PF) 100 MCG/2ML IJ SOLN
INTRAMUSCULAR | Status: AC
  Filled 2018-10-11: qty 2

## 2018-10-11 MED ORDER — HYDROCODONE-ACETAMINOPHEN 5-325 MG PO TABS
1.0000 | ORAL_TABLET | Freq: Once | ORAL | Status: AC
  Administered 2018-10-11: 1 via ORAL

## 2018-10-11 MED ORDER — IBUPROFEN 800 MG PO TABS: 800.0000 mg | ORAL_TABLET | Freq: Three times a day (TID) | ORAL | 0 refills | Status: DC | PRN

## 2018-10-11 MED ORDER — CHLORHEXIDINE GLUCONATE CLOTH 2 % EX PADS: 6.0000 | MEDICATED_PAD | Freq: Once | CUTANEOUS | Status: DC

## 2018-10-11 MED ORDER — LIDOCAINE HCL (PF) 1 % IJ SOLN
INTRAMUSCULAR | Status: AC
  Filled 2018-10-11: qty 30

## 2018-10-11 MED ORDER — HYDROCODONE-ACETAMINOPHEN 5-325 MG PO TABS
ORAL_TABLET | ORAL | Status: AC
  Filled 2018-10-11: qty 1

## 2018-10-11 MED ORDER — ONDANSETRON HCL 4 MG/2ML IJ SOLN
INTRAMUSCULAR | Status: DC | PRN
  Administered 2018-10-11: 4 mg via INTRAVENOUS

## 2018-10-11 MED ORDER — CEFAZOLIN SODIUM-DEXTROSE 2-4 GM/100ML-% IV SOLN
INTRAVENOUS | Status: AC
  Filled 2018-10-11: qty 100

## 2018-10-11 MED ORDER — LIDOCAINE HCL (CARDIAC) PF 100 MG/5ML IV SOSY
PREFILLED_SYRINGE | INTRAVENOUS | Status: DC | PRN
  Administered 2018-10-11: 60 mg via INTRAVENOUS

## 2018-10-11 MED ORDER — OXYCODONE HCL 5 MG/5ML PO SOLN: 5.0000 mg | Freq: Once | ORAL | Status: DC | PRN

## 2018-10-11 MED ORDER — DOCUSATE SODIUM 100 MG PO CAPS: 100.0000 mg | ORAL_CAPSULE | Freq: Two times a day (BID) | ORAL | 0 refills | Status: AC | PRN

## 2018-10-11 MED ORDER — MIDAZOLAM HCL 2 MG/2ML IJ SOLN
INTRAMUSCULAR | Status: AC
  Filled 2018-10-11: qty 2

## 2018-10-11 SURGICAL SUPPLY — 30 items
BLADE SURG 15 STRL LF DISP TIS (BLADE) ×1 IMPLANT
BLADE SURG 15 STRL SS (BLADE) ×1
CHLORAPREP W/TINT 26ML (MISCELLANEOUS) ×2 IMPLANT
COVER WAND RF STERILE (DRAPES) ×2 IMPLANT
DERMABOND ADVANCED (GAUZE/BANDAGES/DRESSINGS) ×1
DERMABOND ADVANCED .7 DNX12 (GAUZE/BANDAGES/DRESSINGS) ×1 IMPLANT
DRAPE LAPAROTOMY 100X77 ABD (DRAPES) ×2 IMPLANT
DRAPE SHEET LG 3/4 BI-LAMINATE (DRAPES) ×2 IMPLANT
ELECT CAUTERY BLADE 6.4 (BLADE) ×2 IMPLANT
ELECT REM PT RETURN 9FT ADLT (ELECTROSURGICAL) ×2
ELECTRODE REM PT RTRN 9FT ADLT (ELECTROSURGICAL) ×1 IMPLANT
GLOVE BIOGEL PI IND STRL 7.0 (GLOVE) ×1 IMPLANT
GLOVE BIOGEL PI INDICATOR 7.0 (GLOVE) ×1
GLOVE SURG SYN 6.5 ES PF (GLOVE) ×2 IMPLANT
GOWN STRL REUS W/ TWL LRG LVL3 (GOWN DISPOSABLE) ×2 IMPLANT
GOWN STRL REUS W/TWL LRG LVL3 (GOWN DISPOSABLE) ×2
KIT TURNOVER KIT A (KITS) ×2 IMPLANT
LABEL OR SOLS (LABEL) ×2 IMPLANT
NEEDLE HYPO 22GX1.5 SAFETY (NEEDLE) ×2 IMPLANT
NS IRRIG 1000ML POUR BTL (IV SOLUTION) ×2 IMPLANT
PACK BASIN MINOR ARMC (MISCELLANEOUS) ×2 IMPLANT
SUT ETHILON 3-0 FS-10 30 BLK (SUTURE)
SUT MNCRL 4-0 (SUTURE) ×1
SUT MNCRL 4-0 27XMFL (SUTURE) ×1
SUT VIC AB 3-0 SH 27 (SUTURE) ×2
SUT VIC AB 3-0 SH 27X BRD (SUTURE) ×2 IMPLANT
SUTURE EHLN 3-0 FS-10 30 BLK (SUTURE) IMPLANT
SUTURE MNCRL 4-0 27XMF (SUTURE) ×1 IMPLANT
SYR 30ML LL (SYRINGE) ×2 IMPLANT
TOWEL OR 17X26 4PK STRL BLUE (TOWEL DISPOSABLE) ×2 IMPLANT

## 2018-10-11 NOTE — Telephone Encounter (Signed)
Called patient to see what was the matter.  States that she went for her surgery this a.m. and her surgeon did read the letter but told her it was up to pain management to manage her pain, post operative pain.  I will call the surgeons office, as they are closed for lunch right now, when they reopen.

## 2018-10-11 NOTE — Op Note (Signed)
  Surgeon: Lysle Pearl Pre-Op Dx: left neck mass Post-Op Dx: same Anesthesia: GETA EBL: 5ML Complications:  none apparent Specimen: left neck mass Procedure: excisional biopsy of left neck mass   Indication for Procedure: See HPI.  Description of Procedure:  Consent obtained, time out performed.  Patient placed in supine position.  Area sterilized and draped in usual position.  Local infused to area previously marked.  4cm incision made through dermis with 15blade and wound tunneled toward the main area of concern due to incision unable to be made directly over area secondary to body habitus.  Dissection through several layers did not note any obvious pathology, until the deeper fascia end where what seemed to be a more solid fatty tumor noted.  The  3cm x 3.5cm x 3.5cm fatty tissue then removed from surrounding tissue completely using electrocautery, passed off field pending pathology.  Small venous bleed from excision site contorlled with 3-0 vicryl in figure of eight fashion.  Wound hemostasis noted, irrigated, then closed in three layer fashion with 3-0 vicryl in interrupted fashion for deep dermal layers, then running 4-0 monocryl in subcuticular fashion for epidermal layer.  Wound then dressed with dermabond.  Pt tolerated procedure well, and transferred to PACU in stable condition. Sponge and instrument count correct at end of procedure.

## 2018-10-11 NOTE — Transfer of Care (Signed)
Immediate Anesthesia Transfer of Care Note  Patient: Lauren [name]  Procedure(s) Performed: REMOVAL OF LEFT NECK LUMP (Left )  Patient Location: PACU  Anesthesia Type:General  Level of Consciousness: awake, alert  and oriented  Airway & Oxygen Therapy: Patient Spontanous Breathing and Patient connected to face mask oxygen  Post-op Assessment: Report given to RN, Post -op Vital signs reviewed and stable and Patient moving all extremities  Post vital signs: Reviewed and stable  Last Vitals:  Vitals Value Taken Time  BP 123/76 10/11/2018  9:27 AM  Temp    Pulse 106 10/11/2018  9:27 AM  Resp 22 10/11/2018  9:27 AM  SpO2 100 % 10/11/2018  9:27 AM  Vitals shown include unvalidated device data.  Last Pain:  Vitals:   10/11/18 0742  TempSrc: Oral  PainSc: 6          Complications: No apparent anesthesia complications

## 2018-10-11 NOTE — Anesthesia Postprocedure Evaluation (Signed)
Anesthesia Post Note  Patient: Lauren [name]  Procedure(s) Performed: REMOVAL OF LEFT NECK LUMP (Left )  Patient location during evaluation: PACU Anesthesia Type: General Level of consciousness: awake and alert Pain management: pain level controlled Vital Signs Assessment: post-procedure vital signs reviewed and stable Respiratory status: spontaneous breathing, nonlabored ventilation, respiratory function stable and patient connected to nasal cannula oxygen Cardiovascular status: blood pressure returned to baseline and stable Postop Assessment: no apparent nausea or vomiting Anesthetic complications: no     Last Vitals:  Vitals:   10/11/18 1018 10/11/18 1050  BP: 128/79 121/70  Pulse: 88 (!) 101  Resp: 19 19  Temp: (!) 36.1 C   SpO2: 100% 100%    Last Pain:  Vitals:   10/11/18 1050  TempSrc:   PainSc: 4                  Meghana Tullo K Dylynn Ketner

## 2018-10-11 NOTE — Discharge Instructions (Signed)

## 2018-10-11 NOTE — Anesthesia Post-op Follow-up Note (Signed)
Anesthesia QCDR form completed.        

## 2018-10-11 NOTE — Interval H&P Note (Signed)
History and Physical Interval Note:  10/11/2018 8:05 AM  Lauren [name]  has presented today for surgery, with the diagnosis of LEFT NECK LUMP  The various methods of treatment have been discussed with the patient and family. After consideration of risks, benefits and other options for treatment, the patient has consented to  Procedure(s): REMOVAL OF LEFT NECK LUMP (Left) as a surgical intervention .  The patient's history has been reviewed, patient examined, no change in status, stable for surgery.  I have reviewed the patient's chart and labs.  Questions were answered to the patient's satisfaction.    I again emphasized how there will be no guarantee symptoms will resolve after said procedure, as noted in the H&P  Demon Volante Lysle Pearl

## 2018-10-11 NOTE — Telephone Encounter (Signed)
Called to Dr Ines Bloomer office to speak about pain management for post surgical pain.  The nurse took the contact information and will have Dr Lysle Pearl get back in touch with [place] re; post surgical pain management.   Called patient to let her know that I have left a message with Dr Lysle Pearl about post surgical pain.

## 2018-10-11 NOTE — Anesthesia Preprocedure Evaluation (Signed)
Anesthesia Evaluation  Patient identified by MRN, date of birth, ID band Patient awake    Reviewed: Allergy & Precautions, H&P , NPO status , Patient's Chart, lab work & pertinent test results  History of Anesthesia Complications Negative for: history of anesthetic complications  Airway Mallampati: III  TM Distance: >3 FB Neck ROM: full    Dental  (+) Chipped   Pulmonary neg shortness of breath, asthma ,           Cardiovascular Exercise Tolerance: Good hypertension, (-) angina(-) Past MI and (-) DOE + dysrhythmias Supra Ventricular Tachycardia      Neuro/Psych  Headaches, PSYCHIATRIC DISORDERS  Neuromuscular disease negative psych ROS   GI/Hepatic Neg liver ROS, GERD  Medicated and Controlled,  Endo/Other  Morbid obesity  Renal/GU      Musculoskeletal  (+) Arthritis , Fibromyalgia -  Abdominal   Peds  Hematology negative hematology ROS (+)   Anesthesia Other Findings Past Medical History: No date: Advanced maternal age in multigravida No date: Asthma No date: Depression No date: GERD (gastroesophageal reflux disease) No date: 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 prior report of SVT req adenosine;  b. 2011 Holter @               [facility] reportedly showed "irregular heartbeat"; c. 05/2016               Holter: "basically unremarkable" per Dr. Clayborn Bigness;  d.               07/2016 Echo: EF 50-55%. Nl RV fxn. No date: Wrist fracture, closed     Comment:  bilateral  Past Surgical History: 01/18/2018: CARDIAC ELECTROPHYSIOLOGY STUDY AND ABLATION 2010: CESAREAN SECTION 2011: CESAREAN SECTION No date: CHOLECYSTECTOMY No  date: KNEE SURGERY No date: ROTATOR CUFF REPAIR No date: TONSILLECTOMY  BMI    Body Mass Index:  58.57 kg/m      Reproductive/Obstetrics negative OB ROS                             Anesthesia Physical Anesthesia Plan  ASA: IV  Anesthesia Plan: General ETT and Modified Rapid Sequence   Post-op Pain Management:    Induction: Intravenous  PONV Risk Score and Plan: Ondansetron, Dexamethasone, Midazolam and Treatment may vary due to age or medical condition  Airway Management Planned: Oral ETT and Video Laryngoscope Planned  Additional Equipment:   Intra-op Plan:   Post-operative Plan: Extubation in OR  Informed Consent: I have reviewed the patients History and Physical, chart, labs and discussed the procedure including the risks, benefits and alternatives for the proposed anesthesia with the patient or authorized representative who has indicated his/her understanding and acceptance.     Dental Advisory Given  Plan Discussed with: Anesthesiologist, CRNA and Surgeon  Anesthesia Plan Comments: (Patient consented for risks of anesthesia including but not limited to:  - adverse reactions to medications - damage to teeth, lips or other oral mucosa - sore throat or hoarseness - Damage to heart, brain, lungs or loss of life  Patient voiced understanding.)        Anesthesia Quick Evaluation

## 2018-10-11 NOTE — Anesthesia Procedure Notes (Signed)
Procedure Name: Intubation Date/Time: 10/11/2018 8:34 AM Performed by: Lorie Apley, CRNA Pre-anesthesia Checklist: Patient identified, Patient being monitored, Timeout performed, Emergency Drugs available and Suction available Patient Re-evaluated:Patient Re-evaluated prior to induction Oxygen Delivery Method: Circle system utilized Preoxygenation: Pre-oxygenation with 100% oxygen Induction Type: IV induction and Rapid sequence Ventilation: Mask ventilation without difficulty Laryngoscope Size: 3 and McGraph Grade View: Grade I Tube type: Oral Tube size: 7.0 mm Number of attempts: 1 Airway Equipment and Method: Stylet Placement Confirmation: ETT inserted through vocal cords under direct vision,  positive ETCO2 and breath sounds checked- equal and bilateral Secured at: 21 cm Tube secured with: Tape Dental Injury: Teeth and Oropharynx as per pre-operative assessment

## 2018-10-11 NOTE — H&P (Signed)
Subjective:   CC: Localized swelling, mass and lump, neck [R22.1]  HPI:  Lauren [name] is a 43 y.o. female who was referred by Fredirick Maudlin, MD for evaluation of above. First noted 6 months ago.  Symptoms include: Pain is sharp, intermittent, radiating down arm.  Exacerbated by movement.  Alleviated by nothing specific.  Associated with increasing lump at the base of her left neck.  She does endorse history of fall onto her outstreched left arm shortly before she noticed lump and pain.  She has undergone shoulder injections(trigger point apparently per chart review) but that has not improved pain.  She states her brother had the same exact issue and underwent removal of fatty tissue in the area, and that cured his symptoms.     Past Medical History:  has a past medical history of Arthritis, Chronic hypertension with superimposed preeclampsia (10/17/2016), Diabetes mellitus type 2, uncomplicated (CMS-HCC), Fibromyalgia, History of marijuana use, Hypertension, Low back pain, and Varicella.  Past Surgical History:  has a past surgical history that includes Shoulder arthroscopy; Knee surgery; Cholecystectomy; injection anesthetic agent other nerve or branch (Left, 08/19/2014); Cesarean Delivery (2010 , 2011); cesarean delivery (Bilateral, 10/15/2016); and Cesarean section.  Family History: family history includes Arthritis in her brother, father, mother, and sister; Diabetes in her brother, father, mother, and sister; High blood pressure (Hypertension) in her brother, father, mother, and sister; Obesity in her brother, daughter, father, maternal grandfather, maternal grandmother, mother, paternal grandfather, paternal grandmother, and son.  Social History:  reports that she has never smoked. She has never used smokeless tobacco. She reports current alcohol use. She reports that she does not use drugs.  Current Medications: has a current medication list which includes the following prescription(s):  acetaminophen, aspirin, diltiazem, ferrous sulfate, ibuprofen, losartan-hydrochlorothiazide, metoprolol tartrate, metoprolol tartrate, naproxen sodium, nexium, albuterol, and divalproex.  Allergies:       Allergies  Allergen Reactions  . Nucynta [Tapentadol] Hives, Palpitations and Other (See Comments)    Throat swelled    ROS:  A 15 point review of systems was performed and pertinent positives and negatives noted in HPI   Objective:   BP 119/87   Pulse 81   Temp 36.3 C (97.4 F) (Oral)   Ht 162.6 cm (5\' 4" )   Wt (!) 154.7 kg (341 lb)   BMI 58.53 kg/m   Constitutional :  alert, appears stated age, cooperative and no distress  Lymphatics/Throat:  subtle asymmetry of increased fullness noted at base of left neck, along scalenes, compared to right side.  non-tender to palpation, but patient endorses pain with neck and shoulder ROM.  general area measures approx 4cm x 3cm   Respiratory:  clear to auscultation bilaterally  Cardiovascular:  regular rate and rhythm  Gastrointestinal: soft, non-tender; bowel sounds normal; no masses,  no organomegaly.    Musculoskeletal: Steady gait and movement  Skin: Cool and moist, keloid surgical scars from previous surgery  Psychiatric: Normal affect, non-agitated, not confused       LABS:  n/a   RADS: Ultrasound Head/Neck  Date of Exam: 12.2.19  Indication(s): Soft tissue mass of neck  Prior Studies/Comparison: none  Using the GE Logiq ultrasound machine and a 12 MHz linear array  transducer, dedicated imaging of the head and neck of the patient was  performed.  Findings:  Right Lobe: Normal size, no nodules seen  Isthmus: Thickened without discrete nodule  Left Lobe: Normal size, no nodules seen  Lymph Nodes: No cervical LAD  Other: In the  area of patient concern, there is no distinct mass clearly  appreciated. There is perhaps an area of increased density, without  increased vascular flow or concerning  features.  Impression: Essentially normal thyroid, no lymphadenopathy. No discrete  mass identified in area of patient concern.  Assessment:      Localized swelling, mass and lump, neck [R22.1]  Plan:   1. Localized swelling, mass and lump, neck [R22.1] Discussed surgical excision.  Alternatives include continued observation.  Benefits include possible symptom relief, pathologic evaluation, improved cosmesis. Discussed the risk of surgery including recurrence, chronic pain, post-op infxn, poor cosmesis, poor/delayed wound healing, and possible re-operation to address said risks. The risks of general anesthetic, if used, includes MI, CVA, sudden death or even reaction to anesthetic medications also discussed.  Typical post-op recovery time of 3-5 days with possible activity restrictions were also discussed.  The patient verbalized understanding and all questions were answered to the patient's satisfaction.  2. I explained to the patient that this lump in its anatomical location does not correlate as a potential cause of her arm issues.  With the negative ultrasound and on physical exam showing no obvious mass, also did state that I may not find any distinct tissue to remove from the surrounding tissue.  I did acknowledge there is some visible asymmetry compared to the right side but again emphasized that there is no guarantee that her current symptoms will resolve after exploration of the area and removal of tissue.  I did specifically reiterate that I cannot guarantee any good cosmetic results and due to her history of hypertrophic scars, it is that this will likely leave a visible scar in the area.  The patient did verbalize and acknowledge all of the above and stated that she still wishes to proceed with the reasoning that this has been going on for some time and that her brother also went through a identical procedure for identical symptoms and he had resolution.

## 2018-10-12 LAB — SURGICAL PATHOLOGY

## 2018-10-15 ENCOUNTER — Telehealth: Payer: Self-pay | Admitting: *Deleted

## 2018-10-15 ENCOUNTER — Ambulatory Visit: Payer: Medicaid Other | Admitting: Pain Medicine

## 2018-10-15 NOTE — Telephone Encounter (Signed)
Spoke with Dr Lysle Pearl re; pain management post surgery.  He states that he is managing post surgical pain as appropriate and is not sure why patient thought that he was leaving pain management for surgical pain up to [place].  He has spoken with the patient and just wanted to let us know.

## 2018-10-17 ENCOUNTER — Ambulatory Visit: Payer: Medicaid Other | Admitting: Pain Medicine

## 2018-10-23 NOTE — Progress Notes (Deleted)
Patient's Name: Lauren [name]  MRN: [id number]  Referring Provider: Hubbard Hartshorn, FNP  DOB: 22-Nov-1975  PCP: Hubbard Hartshorn, FNP  DOS: 10/24/2018  Note by: Gaspar Cola, MD  Service setting: Ambulatory outpatient  Specialty: Interventional Pain Management  Location: [facility] (AMB) Pain Management Facility    Patient type: Established   HPI  Reason for Visit: [name] is a 43 y.o. year old, female patient, who comes today with a chief complaint of No chief complaint on file. Last Appointment: Her last appointment at our practice was on 10/15/2018. I last saw her on 09/27/2018.  Pain Assessment: Today, [name] describes the severity of the   as a  /10. She indicates the location/referral of the pain to be    / . Onset was:  Marland Kitchen The quality of pain is described as  . Temporal description, or timing of pain is:  . Possible modifying factors:  . [name]'s  vitals were not taken for this visit.   The last time I saw the patient I did a bilateral glenohumeral (shoulder) intra-articular injection as well as a left-sided extensor policies longus and extensor indices trigger point injection under fluoroscopic guidance and IV sedation.   Follow-up as scheduled to discuss MRI.  ROS  Constitutional: Denies any fever or chills Gastrointestinal: No reported hemesis, hematochezia, vomiting, or acute GI distress Musculoskeletal: Denies any acute onset joint swelling, redness, loss of ROM, or weakness Neurological: No reported episodes of acute onset apraxia, aphasia, dysarthria, agnosia, amnesia, paralysis, loss of coordination, or loss of consciousness  Medication Review  HYDROcodone-acetaminophen, Tiotropium Bromide-Olodaterol, acetaminophen, albuterol, aspirin EC, beclomethasone, diltiazem, esomeprazole, fluticasone, furosemide, gabapentin, ibuprofen, losartan-hydrochlorothiazide, and metoprolol succinate  History Review  Allergy: [name] is allergic to nucynta [tapentadol] and  tape. Drug: [name]  reports no history of drug use. Alcohol:  reports no history of alcohol use. Tobacco:  reports that she has never smoked. She has never used smokeless tobacco. Social: [name]  reports that she has never smoked. She has never used smokeless tobacco. She reports that she does not drink alcohol or use drugs. Medical:  has a past medical history of Advanced maternal age in multigravida, Asthma, Depression, GERD (gastroesophageal reflux disease), Grand multiparity, Headache, History of carpal tunnel surgery of left wrist (05/2018), Hypertension, Leg fracture, Obesity, morbid, BMI 40.0-49.9 ([place]), Post traumatic stress disorder, Pre-diabetes, Pre-eclampsia, SVT (supraventricular tachycardia) ([place]), and Wrist fracture, closed. Surgical: [name]  has a past surgical history that includes Rotator cuff repair; Knee surgery; Cholecystectomy; Cesarean section (2010); Cesarean section (2011); Tonsillectomy; Cardiac electrophysiology study and ablation (01/18/2018); and Excision mass neck (Left, 10/11/2018). Family: family history includes Coronary artery disease in her brother; Diabetes in her brother, father, mother, sister, sister, sister, sister, sister, and sister; Hypertension in her brother and brother; Migraines in her father, maternal aunt, maternal uncle, paternal aunt, and paternal uncle; Ovarian cancer in her cousin; Stomach cancer in her sister; Throat cancer in her maternal aunt. Problem List: [name] has Chest pain; Chronic knee pain (Primary Area of Pain) (Bilateral) (L>R); Chronic daily headache; Chronic pain in right shoulder; Chronic generalized pain; Fibromyalgia; Chronic wrist pain (Secondary Area of Pain) (Left); Chronic shoulder pain (Fourth Area of Pain) (Bilateral) (L>R); 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 (Bilateral); Chronic pain of left upper extremity (R>L); Chronic neck pain (Tertiary Area of Pain) (Bilateral) (L>R); Chronic lower  extremity pain (Bilateral); Chronic sacroiliac joint pain; Chronic pain syndrome; Chronic musculoskeletal pain;  Osteoarthritis of knee (Bilateral); Cervicalgia; Chronic low back pain (Fifth Area of Pain) (Bilateral) (R>L) w/o sciatica; DDD (degenerative disc disease), lumbar; Neurogenic pain; Impaired ambulation; Lumbar spondylosis; Chronic knee pain (Primary Area of Pain) (Left); Lipoma of left shoulder; Osteoarthritis of shoulder (Bilateral); and Chronic forearm pain (Left) on their pertinent problem list.  Lab Review  Kidney Function Lab Results  Component Value Date   BUN 9 08/02/2018   CREATININE 0.78 84/69/6295   BCR NOT APPLICABLE 28/41/3244   GFRAA 110 08/02/2018   GFRNONAA 95 08/02/2018  Liver Function Lab Results  Component Value Date   AST 21 07/12/2018   ALT 25 07/12/2018   ALBUMIN 3.9 06/25/2018  Note: Above Lab results reviewed.  Imaging Review  MR KNEE RIGHT WO CONTRAST CLINICAL DATA:  Arthritis and chronic knee pain.  EXAM: MRI OF THE RIGHT KNEE WITHOUT CONTRAST  TECHNIQUE: Multiplanar, multisequence MR imaging of the knee was performed. No intravenous contrast was administered.  COMPARISON:  None.  FINDINGS: This study is limited by patient body habitus.  MENISCI  Medial meniscus: Mucoid degeneration of the posterior horn without definite surfacing tear identified.  Lateral meniscus: Slightly discoid lateral meniscus without tear, series 17/7.  LIGAMENTS  Cruciates:  Intact  Collaterals:  Intact  CARTILAGE  Patellofemoral: Fissuring of the cartilage overlying the lateral patellar facet and median ridge, full-thickness given subchondral cystic change and erosions noted of the patella.  Medial: Mild partial-thickness cartilage loss of the medial femorotibial compartment.  Lateral:  No chondral defect.  Joint:  No joint effusion. Normal Hoffa's fat. No plical thickening.  Popliteal Fossa:  No Baker cyst. Intact popliteus tendon.  Extensor  Mechanism:  Intact quadriceps tendon and patellar tendon.  Bones: No focal marrow signal abnormality. No fracture or dislocation.  Other: None  IMPRESSION: 1. Slightly discoid lateral meniscus without tear. Mucoid degeneration of the posterior horn of the medial meniscus without surfacing tear identified though fine detail is limited due to patient body habitus. 2. Fissuring of the patellar cartilage with subchondral cystic change. 3. Intact cruciate and collateral ligaments.  Electronically Signed   By: Ashley Royalty M.D.   On: 10/06/2018 20:43   MR KNEE LEFT WO CONTRAST CLINICAL DATA:  Bilateral knee pain status post multiple falls with clicking, popping and swelling. Arthritis.  EXAM: MRI OF THE LEFT KNEE WITHOUT CONTRAST  TECHNIQUE: Multiplanar, multisequence MR imaging of the knee was performed. No intravenous contrast was administered.  COMPARISON:  Radiographs from 04/05/2012  FINDINGS: This study is limited by patient body habitus.  MENISCI  Medial meniscus:  Intact  Lateral meniscus:  Intact  LIGAMENTS  Cruciates:  Intact ACL and PCL.  Collaterals: Medial collateral ligament is intact. Lateral collateral ligament complex is intact.  CARTILAGE  Patellofemoral:  No chondral defect.  Medial: Mild partial-thickness cartilage loss of the medial femorotibial compartment.  Lateral: Suspect full-thickness chondral fissuring secondary to subchondral cystic change along posterior aspect of the lateral tibial plateau.  Joint:  No joint effusion. Normal Hoffa's fat. No plical thickening.  Popliteal Fossa: No Baker cyst. Intact popliteus tendon. Small cysts likely representing ganglia are identified adjacent to the PCL.  Extensor Mechanism:  Intact quadriceps tendon and patellar tendon.  Bones: Subchondral edema and cystic change deep to the lateral tibial plateau posteriorly.  Other: None  IMPRESSION: 1. Subchondral cystic change of the lateral  tibial plateau likely representing areas of full-thickness chondral loss and or fissuring. 2. No apparent meniscal, cruciate or collateral ligament tear given limitations due to  patient body habitus. 3. Small ganglion cysts are identified adjacent to the PCL.  Electronically Signed   By: Ashley Royalty M.D.   On: 10/06/2018 20:38  Note: Above imaging results reviewed.        Physical Exam  General appearance: Well nourished, well developed, and well hydrated. In no apparent acute distress Mental status: Alert, oriented x 3 (person, place, & time)       Respiratory: No evidence of acute respiratory distress Eyes: PERLA Vitals: There were no vitals taken for this visit. BMI: Estimated body mass index is 58.57 kg/m as calculated from the following:   Height as of 10/11/18: 5\' 4"  (1.626 m).   Weight as of 10/11/18: 341 lb 3.3 oz (154.8 kg). Ideal: Ideal body weight: 54.7 kg (120 lb 9.5 oz) Adjusted ideal body weight: 94.7 kg (208 lb 13.4 oz)  Assessment   Status Diagnosis  Controlled Controlled Controlled 1. Chronic knee pain (Primary Area of Pain) (Bilateral) (L>R)   2. Chronic wrist pain (Secondary Area of Pain) (Left)   3. Chronic neck pain (Tertiary Area of Pain) (Bilateral) (L>R)   4. Chronic shoulder pain (Fourth Area of Pain) (Bilateral) (L>R)   5. Chronic low back pain (Fifth Area of Pain) (Bilateral) (R>L) w/o sciatica   6. Morbid obesity with BMI of 50.0-59.9, adult ([place])      Updated Problems: No problems updated.  Plan of Care  Medications: I am having Lauren [name] maintain her aspirin EC, diltiazem, furosemide, metoprolol succinate, losartan-hydrochlorothiazide, STIOLTO RESPIMAT, esomeprazole, albuterol, beclomethasone, fluticasone, gabapentin, HYDROcodone-acetaminophen, acetaminophen, and ibuprofen.  Administered today: Lauren [name] had no medications administered during this visit.  Orders:  No orders of the defined types were placed in this  encounter.  Interventional options: Planned follow-up:   NOTE:NO RFAuntil depression brings her BMIbelow 35. Not at this time. No follow-ups on file. Return Date: 12/10/2018    Considering:   Diagnostic left knee genicular nerve block Diagnostic left intra-articular wrist injection Diagnostic bilateral cervical facet nerve block Diagnostic bilateral cervical ESI Possible bilateral cervical facet RFA Diagnostic left intra-articular shoulder injection Diagnostic bilateral suprascapular nerve block Possible bilateral suprascapular nerve RFA Diagnostic bilateral LESI Diagnostic bilateral lumbar facet nerve block Possible bilateral lumbar facet RFA   Palliative PRN treatment(s):   Palliative bilateral intra-articular [place] knee injection series #2under fluoroscopic guidance   Note by: Gaspar Cola, MD Date: 10/24/2018; Time: 7:53 AM

## 2018-10-24 ENCOUNTER — Ambulatory Visit: Payer: Medicaid Other | Admitting: Pain Medicine

## 2018-11-02 ENCOUNTER — Ambulatory Visit: Payer: Medicaid Other | Admitting: Family Medicine

## 2018-11-04 NOTE — Progress Notes (Deleted)
[facility]  [phone] [phone]  ID: Lauren [name] OB: [birth year]  MR#: [id number]  YYT#:[id number]  Patient Care Team: Hubbard Hartshorn, FNP as PCP - General (Family Medicine) Yolonda Kida, MD as Consulting Physician (Cardiology)  CHIEF COMPLAINT: Iron deficiency anemia, leukocytosis, thrombocytosis.  INTERVAL HISTORY: Patient returns to clinic today for further evaluation and initiation of IV Feraheme.  She continues to have chronic weakness and fatigue.  She also has bilateral knee pain.  She otherwise feels well. She has no neurologic complaints. She has good appetite and denies weight loss.  She denies chest pain, but admits to dyspnea on exertion.  She denies any nausea, vomiting, constipation, or diarrhea.  She has no urinary complaints.  Patient offers no further specific complaints today.  REVIEW OF SYSTEMS:   Review of Systems  Constitutional: Positive for malaise/fatigue. Negative for fever and weight loss.  Respiratory: Negative.  Negative for cough and shortness of breath.   Cardiovascular: Negative.  Negative for chest pain and leg swelling.  Gastrointestinal: Negative.  Negative for abdominal pain, blood in stool and melena.  Genitourinary: Negative.  Negative for dysuria and hematuria.  Musculoskeletal: Positive for joint pain. Negative for back pain.  Skin: Negative.  Negative for rash.  Neurological: Positive for weakness. Negative for focal weakness and headaches.  Psychiatric/Behavioral: Negative.  The patient is not nervous/anxious.     As per HPI. Otherwise, a complete review of systems is negative.  PAST MEDICAL HISTORY: Past Medical History:  Diagnosis Date  . Advanced maternal age in multigravida   . Asthma   . Depression   . GERD (gastroesophageal reflux disease)   . [place] multiparity   . Headache   . History of carpal tunnel surgery of left wrist 05/2018  . Hypertension   . Leg fracture   . Obesity,  morbid, BMI 40.0-49.9 ([place])   . Post traumatic stress disorder   . Pre-diabetes   . Pre-eclampsia   . SVT (supraventricular tachycardia) ([place])    a. prior report of SVT req adenosine;  b. 2011 Holter @ [facility] reportedly showed "irregular heartbeat"; c. 05/2016 Holter: "basically unremarkable" per Dr. Clayborn Bigness;  d. 07/2016 Echo: EF 50-55%. Nl RV fxn.  . Wrist fracture, closed    bilateral    PAST SURGICAL HISTORY: Past Surgical History:  Procedure Laterality Date  . CARDIAC ELECTROPHYSIOLOGY STUDY AND ABLATION  01/18/2018  . CESAREAN SECTION  2010  . CESAREAN SECTION  2011  . CHOLECYSTECTOMY    . EXCISION MASS NECK Left 10/11/2018   Procedure: REMOVAL OF LEFT NECK LUMP;  Surgeon: Benjamine Sprague, DO;  Location: [facility] ORS;  Service: General;  Laterality: Left;  . KNEE SURGERY    . ROTATOR CUFF REPAIR    . TONSILLECTOMY      FAMILY HISTORY: Family History  Problem Relation Age of Onset  . Diabetes Sister   . Migraines Maternal Aunt   . Throat cancer Maternal Aunt   . Migraines Maternal Uncle   . Migraines Paternal Aunt   . Migraines Paternal Uncle   . Diabetes Sister   . Stomach cancer Sister   . Diabetes Sister   . Diabetes Sister   . Diabetes Sister   . Diabetes Sister   . Ovarian cancer Cousin   . Diabetes Mother   . Diabetes Father   . Migraines Father   . Hypertension Brother   . Coronary artery disease Brother   . Hypertension Brother   . Diabetes Brother   .  Breast cancer Neg Hx     ADVANCED DIRECTIVES (Y/N):  N  HEALTH MAINTENANCE: Social History   Tobacco Use  . Smoking status: Never Smoker  . Smokeless tobacco: Never Used  Substance Use Topics  . Alcohol use: No    Comment: occ.  . Drug use: No    Comment: last used several months ago     Colonoscopy:  PAP:  Bone density:  Lipid panel:  Allergies  Allergen Reactions  . Nucynta [Tapentadol] Hives and Swelling    Swelling of the throat   . Tape     Paper tape and Tegaderm ok    Current  Outpatient Medications  Medication Sig Dispense Refill  . acetaminophen (TYLENOL) 325 MG tablet Take 2 tablets (650 mg total) by mouth every 8 (eight) hours as needed for up to 30 days for mild pain. 40 tablet 0  . albuterol (PROVENTIL HFA;VENTOLIN HFA) 108 (90 Base) MCG/ACT inhaler Inhale 2 puffs into the lungs every 6 (six) hours as needed for wheezing or shortness of breath. 1 Inhaler 0  . aspirin EC 81 MG tablet Take 81 mg by mouth daily.    . beclomethasone (QVAR REDIHALER) 80 MCG/ACT inhaler Inhale 1 puff into the lungs 2 (two) times daily. (Patient not taking: Reported on 10/11/2018) 1 Inhaler 2  . diltiazem (CARDIZEM CD) 180 MG 24 hr capsule Take 1 capsule by mouth every other day.     . esomeprazole (NEXIUM) 40 MG capsule Take 1 capsule (40 mg total) by mouth daily at 12 noon. (Patient not taking: Reported on 10/11/2018) 90 capsule 1  . fluticasone (FLOVENT HFA) 220 MCG/ACT inhaler Inhale 2 puffs into the lungs 2 (two) times daily. (Patient not taking: Reported on 10/11/2018) 1 Inhaler 2  . furosemide (LASIX) 40 MG tablet Take 40 mg by mouth 3 (three) times a week.   5  . gabapentin (NEURONTIN) 300 MG capsule Take 1 capsule (300 mg total) by mouth 3 (three) times daily. (Patient taking differently: Take 300 mg by mouth daily as needed (neuropathy). ) 90 capsule 0  . HYDROcodone-acetaminophen (NORCO) 5-325 MG tablet Take 1 tablet by mouth every 8 (eight) hours as needed for up to 30 days for moderate pain. 90 tablet 0  . ibuprofen (ADVIL,MOTRIN) 800 MG tablet Take 1 tablet (800 mg total) by mouth every 8 (eight) hours as needed for mild pain or moderate pain. 30 tablet 0  . losartan-hydrochlorothiazide (HYZAAR) 50-12.5 MG tablet Take 1 tablet by mouth daily. (Patient not taking: Reported on 10/11/2018) 90 tablet 3  . metoprolol succinate (TOPROL-XL) 100 MG 24 hr tablet Take 100 mg by mouth daily. Take with or immediately following a meal.    . STIOLTO RESPIMAT 2.5-2.5 MCG/ACT AERS Inhale 2 puffs  into the lungs daily. (Patient not taking: Reported on 10/11/2018) 1 Inhaler 0   No current facility-administered medications for this visit.     OBJECTIVE: There were no vitals filed for this visit.   There is no height or weight on file to calculate BMI.    ECOG FS:0 - Asymptomatic  General: Well-developed, well-nourished, no acute distress. Eyes: Pink conjunctiva, anicteric sclera. HEENT: Normocephalic, moist mucous membranes. Lungs: Clear to auscultation bilaterally. Heart: Regular rate and rhythm. No rubs, murmurs, or gallops. Abdomen: Soft, nontender, nondistended. No organomegaly noted, normoactive bowel sounds. Musculoskeletal: No edema, cyanosis, or clubbing. Neuro: Alert, answering all questions appropriately. Cranial nerves grossly intact. Skin: No rashes or petechiae noted. Psych: Normal affect.  LAB RESULTS:  Lab  Results  Component Value Date   NA 139 08/02/2018   K 4.0 08/02/2018   CL 103 08/02/2018   CO2 25 08/02/2018   GLUCOSE 67 08/02/2018   BUN 9 08/02/2018   CREATININE 0.78 08/02/2018   CALCIUM 9.6 08/02/2018   PROT 7.8 07/12/2018   ALBUMIN 3.9 06/25/2018   AST 21 07/12/2018   ALT 25 07/12/2018   ALKPHOS 58 06/25/2018   BILITOT 0.3 07/12/2018   GFRNONAA 95 08/02/2018   GFRAA 110 08/02/2018    Lab Results  Component Value Date   WBC 13.7 (H) 07/19/2018   NEUTROABS 8.7 (H) 07/19/2018   HGB 9.8 (L) 07/19/2018   HCT 33.1 (L) 07/19/2018   MCV 78.8 (L) 07/19/2018   PLT 459 (H) 07/19/2018   Lab Results  Component Value Date   IRON 20 (L) 07/12/2018   TIBC 429 07/12/2018   IRONPCTSAT 5 (L) 07/12/2018   Lab Results  Component Value Date   FERRITIN 18 07/12/2018     STUDIES: Mr Knee Right Wo Contrast  Result Date: 10/06/2018 CLINICAL DATA:  Arthritis and chronic knee pain. EXAM: MRI OF THE RIGHT KNEE WITHOUT CONTRAST TECHNIQUE: Multiplanar, multisequence MR imaging of the knee was performed. No intravenous contrast was administered. COMPARISON:   None. FINDINGS: This study is limited by patient body habitus. MENISCI Medial meniscus: Mucoid degeneration of the posterior horn without definite surfacing tear identified. Lateral meniscus: Slightly discoid lateral meniscus without tear, series 17/7. LIGAMENTS Cruciates:  Intact Collaterals:  Intact CARTILAGE Patellofemoral: Fissuring of the cartilage overlying the lateral patellar facet and median ridge, full-thickness given subchondral cystic change and erosions noted of the patella. Medial: Mild partial-thickness cartilage loss of the medial femorotibial compartment. Lateral:  No chondral defect. Joint:  No joint effusion. Normal Hoffa's fat. No plical thickening. Popliteal Fossa:  No Baker cyst. Intact popliteus tendon. Extensor Mechanism:  Intact quadriceps tendon and patellar tendon. Bones: No focal marrow signal abnormality. No fracture or dislocation. Other: None IMPRESSION: 1. Slightly discoid lateral meniscus without tear. Mucoid degeneration of the posterior horn of the medial meniscus without surfacing tear identified though fine detail is limited due to patient body habitus. 2. Fissuring of the patellar cartilage with subchondral cystic change. 3. Intact cruciate and collateral ligaments. Electronically Signed   By: Ashley Royalty M.D.   On: 10/06/2018 20:43   Mr Knee Left Wo Contrast  Result Date: 10/06/2018 CLINICAL DATA:  Bilateral knee pain status post multiple falls with clicking, popping and swelling. Arthritis. EXAM: MRI OF THE LEFT KNEE WITHOUT CONTRAST TECHNIQUE: Multiplanar, multisequence MR imaging of the knee was performed. No intravenous contrast was administered. COMPARISON:  Radiographs from 04/05/2012 FINDINGS: This study is limited by patient body habitus. MENISCI Medial meniscus:  Intact Lateral meniscus:  Intact LIGAMENTS Cruciates:  Intact ACL and PCL. Collaterals: Medial collateral ligament is intact. Lateral collateral ligament complex is intact. CARTILAGE Patellofemoral:  No  chondral defect. Medial: Mild partial-thickness cartilage loss of the medial femorotibial compartment. Lateral: Suspect full-thickness chondral fissuring secondary to subchondral cystic change along posterior aspect of the lateral tibial plateau. Joint:  No joint effusion. Normal Hoffa's fat. No plical thickening. Popliteal Fossa: No Baker cyst. Intact popliteus tendon. Small cysts likely representing ganglia are identified adjacent to the PCL. Extensor Mechanism:  Intact quadriceps tendon and patellar tendon. Bones: Subchondral edema and cystic change deep to the lateral tibial plateau posteriorly. Other: None IMPRESSION: 1. Subchondral cystic change of the lateral tibial plateau likely representing areas of full-thickness chondral loss and or  fissuring. 2. No apparent meniscal, cruciate or collateral ligament tear given limitations due to patient body habitus. 3. Small ganglion cysts are identified adjacent to the PCL. Electronically Signed   By: Ashley Royalty M.D.   On: 10/06/2018 20:38    ASSESSMENT: Iron deficiency anemia, leukocytosis, thrombocytosis.  PLAN:    1.  Iron deficiency anemia: Patient noted to have mildly decreased hemoglobin with decreased iron stores.  All of her other laboratory work is either negative or within normal limits.  Proceed with 510 mg IV Feraheme today.  Patient will then return to clinic in 2 weeks because of the Thanksgiving holiday for second infusion.  She will then return to clinic in 3 months with repeat laboratory work and further evaluation. 2.  Thrombocytosis: Secondary to iron deficiency.  Proceed with Feraheme as above. 3.  Leukocytosis: Patient's white blood cell count is now trending down.  Peripheral blood flow cytometry is negative.  No further intervention is needed.  Monitor.    Patient expressed understanding and was in agreement with this plan. She also understands that She can call clinic at any time with any questions, concerns, or complaints.    Cancer Staging No matching staging information was found for the patient.  Lloyd Huger, MD   11/04/2018 11:05 PM

## 2018-11-06 ENCOUNTER — Inpatient Hospital Stay: Payer: Medicaid Other | Admitting: Oncology

## 2018-11-06 ENCOUNTER — Inpatient Hospital Stay: Payer: Medicaid Other

## 2018-12-10 ENCOUNTER — Ambulatory Visit: Payer: Medicaid Other | Attending: Nurse Practitioner | Admitting: Nurse Practitioner

## 2018-12-10 ENCOUNTER — Other Ambulatory Visit: Payer: Self-pay

## 2018-12-10 DIAGNOSIS — M25512 Pain in left shoulder: Secondary | ICD-10-CM | POA: Diagnosis not present

## 2018-12-10 DIAGNOSIS — M792 Neuralgia and neuritis, unspecified: Secondary | ICD-10-CM

## 2018-12-10 DIAGNOSIS — M47816 Spondylosis without myelopathy or radiculopathy, lumbar region: Secondary | ICD-10-CM

## 2018-12-10 DIAGNOSIS — G8929 Other chronic pain: Secondary | ICD-10-CM

## 2018-12-10 DIAGNOSIS — M25511 Pain in right shoulder: Secondary | ICD-10-CM | POA: Diagnosis not present

## 2018-12-10 DIAGNOSIS — M170 Bilateral primary osteoarthritis of knee: Secondary | ICD-10-CM

## 2018-12-11 MED ORDER — GABAPENTIN 300 MG PO CAPS: 300.0000 mg | ORAL_CAPSULE | Freq: Three times a day (TID) | ORAL | 2 refills | Status: DC

## 2018-12-11 MED ORDER — HYDROCODONE-ACETAMINOPHEN 5-325 MG PO TABS: 1.0000 | ORAL_TABLET | Freq: Three times a day (TID) | ORAL | 0 refills | Status: DC | PRN

## 2018-12-11 NOTE — Progress Notes (Signed)
Virtual Visit via Telephone Note  I connected with Lauren [name] on 12/10/18 at  9:30 AM EDT by telephone and verified that I am speaking with the correct person using two identifiers.   I discussed the limitations, risks, security and privacy concerns of performing an evaluation and management service by telephone and the availability of in person appointments. I also discussed with the patient that there may be a patient responsible charge related to this service. The patient expressed understanding and agreed to proceed.   History of Present Illness: She is complaining of increased left knee pain with weakness. She is having to use her cane more. She is rating the pain a 1/10 at this present moment because she is lying down. She admits that with activity the pain is as 3/10. She is SP bilateral knee Hyalgan series. She admits that she was told by an orthopedist in the past that she needs a left total knee replacement however this was not done due to her age. She would like to know what else can be done to treat her knees. She did get some relief with the Hyalgan injections. She was in the starting phase for bariatric surgery however due to the Coronavirus this has been placed on hold.  She is also complaining of her left shoulder pain. 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 2020. She admits that the pain that was in her shoulder has returned. She is also SP intra-auricular shoulder injection which was effective for her shoulder pain. She wonders if this can be repeated or what other options she have. She denies any arm pain.     Observations/Objective:43 yr old female cooperative, alert, oriented x3  with Chronic pain syndrome continuing to suffer with left shoulder and bilateral knee pain with the left being greater than the right. Limited weight loss that would potentially help with some chronic pain issues. .    Assessment and Plan:   Status Diagnosis   Worsening Worsening Persistent 1. Chronic shoulder pain (Fourth Area of Pain) (Bilateral) (L>R)   2. Osteoarthritis of knee (Bilateral)   3. Lumbar spondylosis   4. Neurogenic pain      Updated Problems: No problems updated.   Follow Up Instructions:  Interventional management options: Planned, scheduled, and/or pending NOTE:NO RFAuntil depression brings her BMIbelow 35. Palliative bilateral intra-articular Hyalgan knee injection series #2  Diagnostic left suprascapular nerve block   Considering: Diagnostic left knee genicular nerve block Diagnostic left intra-articular wrist injection Diagnostic bilateral cervical facet nerve block Diagnostic bilateral cervical epidural steroid injection Possible bilateral cervical facet radiofrequency ablation Diagnostic left intra-articular shoulder injection Diagnostic bilateral suprascapular nerve block Possible bilateral suprascapular nerve radiofrequency ablation Diagnostic bilateral lumbar epidural steroid injection Diagnostic bilateral lumbar facet nerve block Possible bilateral lumbar facet radiofrequency ablation   Palliative PRN treatment(s): Palliative bilateral intra-articular Hyalgan knee injection series #2under fluoroscopic guidance    I discussed the assessment and treatment plan with the patient. The patient was provided an opportunity to ask questions and all were answered. The patient agreed with the plan and demonstrated an understanding of the instructions.   The patient was advised to call back or seek an in-person evaluation if the symptoms worsen or if the condition fails to improve as anticipated.  I provided 15 minutes of non-face-to-face time during this encounter.   Dionisio David, NP

## 2018-12-18 ENCOUNTER — Other Ambulatory Visit: Payer: Self-pay

## 2018-12-18 ENCOUNTER — Encounter: Payer: Self-pay | Admitting: Pain Medicine

## 2018-12-18 ENCOUNTER — Ambulatory Visit
Admission: RE | Admit: 2018-12-18 | Discharge: 2018-12-18 | Disposition: A | Discharge status: Home / Self Care | Payer: Medicaid Other | Source: Ambulatory Visit | Attending: Pain Medicine | Admitting: Pain Medicine

## 2018-12-18 ENCOUNTER — Ambulatory Visit (HOSPITAL_BASED_OUTPATIENT_CLINIC_OR_DEPARTMENT_OTHER): Payer: Medicaid Other | Admitting: Pain Medicine

## 2018-12-18 VITALS — BP 127/82 | HR 87 | Temp 98.8°F | Resp 20 | Ht 64.0 in | Wt 342.0 lb

## 2018-12-18 DIAGNOSIS — M19011 Primary osteoarthritis, right shoulder: Secondary | ICD-10-CM

## 2018-12-18 DIAGNOSIS — M25561 Pain in right knee: Secondary | ICD-10-CM | POA: Diagnosis not present

## 2018-12-18 DIAGNOSIS — M25512 Pain in left shoulder: Secondary | ICD-10-CM

## 2018-12-18 DIAGNOSIS — M170 Bilateral primary osteoarthritis of knee: Secondary | ICD-10-CM

## 2018-12-18 DIAGNOSIS — G8929 Other chronic pain: Secondary | ICD-10-CM | POA: Insufficient documentation

## 2018-12-18 DIAGNOSIS — M25562 Pain in left knee: Secondary | ICD-10-CM | POA: Diagnosis not present

## 2018-12-18 DIAGNOSIS — M25511 Pain in right shoulder: Secondary | ICD-10-CM | POA: Insufficient documentation

## 2018-12-18 DIAGNOSIS — M19012 Primary osteoarthritis, left shoulder: Secondary | ICD-10-CM | POA: Diagnosis present

## 2018-12-18 MED ORDER — MIDAZOLAM HCL 5 MG/5ML IJ SOLN
INTRAMUSCULAR | Status: AC
  Filled 2018-12-18: qty 5

## 2018-12-18 MED ORDER — METHYLPREDNISOLONE ACETATE 80 MG/ML IJ SUSP
80.0000 mg | Freq: Once | INTRAMUSCULAR | Status: AC
  Administered 2018-12-18: 09:00:00 80 mg

## 2018-12-18 MED ORDER — FENTANYL CITRATE (PF) 100 MCG/2ML IJ SOLN
INTRAMUSCULAR | Status: AC
  Filled 2018-12-18: qty 2

## 2018-12-18 MED ORDER — SODIUM HYALURONATE (VISCOSUP) 20 MG/2ML IX SOSY
2.0000 mL | PREFILLED_SYRINGE | Freq: Once | INTRA_ARTICULAR | Status: AC
  Administered 2018-12-18: 09:00:00 2 mL via INTRA_ARTICULAR

## 2018-12-18 MED ORDER — METHYLPREDNISOLONE ACETATE 80 MG/ML IJ SUSP
INTRAMUSCULAR | Status: AC
  Filled 2018-12-18: qty 1

## 2018-12-18 MED ORDER — ROPIVACAINE HCL 2 MG/ML IJ SOLN
5.0000 mL | Freq: Once | INTRAMUSCULAR | Status: AC
  Administered 2018-12-18: 09:00:00 5 mL via INTRA_ARTICULAR

## 2018-12-18 MED ORDER — FENTANYL CITRATE (PF) 100 MCG/2ML IJ SOLN
25.0000 ug | INTRAMUSCULAR | Status: DC | PRN
  Administered 2018-12-18: 09:00:00 100 ug via INTRAVENOUS

## 2018-12-18 MED ORDER — MIDAZOLAM HCL 5 MG/5ML IJ SOLN
1.0000 mg | INTRAMUSCULAR | Status: DC | PRN
  Administered 2018-12-18: 09:00:00 3 mg via INTRAVENOUS

## 2018-12-18 MED ORDER — ROPIVACAINE HCL 2 MG/ML IJ SOLN
4.0000 mL | Freq: Once | INTRAMUSCULAR | Status: AC
  Administered 2018-12-18: 09:00:00 4 mL via PERINEURAL

## 2018-12-18 MED ORDER — LIDOCAINE HCL 2 % IJ SOLN
20.0000 mL | Freq: Once | INTRAMUSCULAR | Status: AC
  Administered 2018-12-18: 400 mg

## 2018-12-18 MED ORDER — ROPIVACAINE HCL 2 MG/ML IJ SOLN
INTRAMUSCULAR | Status: AC
  Filled 2018-12-18: qty 10

## 2018-12-18 MED ORDER — LACTATED RINGERS IV SOLN
1000.0000 mL | Freq: Once | INTRAVENOUS | Status: AC
  Administered 2018-12-18: 1000 mL via INTRAVENOUS

## 2018-12-18 MED ORDER — LIDOCAINE HCL (PF) 1 % IJ SOLN
INTRAMUSCULAR | Status: AC
  Filled 2018-12-18: qty 5

## 2018-12-18 MED ORDER — LIDOCAINE HCL (PF) 1 % IJ SOLN
5.0000 mL | Freq: Once | INTRAMUSCULAR | Status: AC
  Administered 2018-12-18: 5 mL

## 2018-12-18 NOTE — Progress Notes (Signed)
Patient's Name: Lauren [name]  MRN: [id number]  Referring Provider: Vevelyn Francois, NP  DOB: 06/19/1976  PCP: Hubbard Hartshorn, FNP  DOS: 12/18/2018  Note by: Gaspar Cola, MD  Service setting: Ambulatory outpatient  Specialty: Interventional Pain Management  Patient type: Established  Location: [facility] (AMB) Pain Management Facility  Visit type: Interventional Procedure   Primary Reason for Visit: Interventional Pain Management Treatment. CC: Knee Pain (bilateral) and Shoulder Pain (left)  Procedure #1:  Anesthesia, Analgesia, Anxiolysis:  Type: Therapeutic Intra-Articular Hyalgan Knee Injection #1 (Series #2)  Region: Lateral infrapatellar Knee Region Level: Knee Joint Laterality: Bilateral  Type: Moderate (Conscious) Sedation combined with Local Anesthesia Indication(s): Analgesia and Anxiety Local Anesthetic: Lidocaine 1-2% Route: Intravenous (IV) IV Access: Secured Sedation: Meaningful verbal contact was maintained at all times during the procedure   Position: Supine w/ knee bent 20 to 30 degrees   Indications: 1. Osteoarthritis of knee (Bilateral)   2. Chronic knee pain (Primary Area of Pain) (Bilateral) (L>R)    Procedure #2:  Anesthesia, Analgesia, Anxiolysis:  Type: Palliative Suprascapular nerve Block          Primary Purpose: Palliative Region: Posterior Shoulder & Scapular Areas Level: Superior to the scapular spine, in the lateral aspect of the supraspinatus fossa (Suprescapular notch). Target Area: Suprascapular nerve as it passes thru the lower portion of the suprascapular notch. Approach: Posterior percutaneous approach. Laterality: Left-Side  Type: Moderate (Conscious) Sedation combined with Local Anesthesia Indication(s): Analgesia and Anxiety Route: Intravenous (IV) IV Access: Secured Sedation: Meaningful verbal contact was maintained at all times during the procedure  Local Anesthetic: Lidocaine 1-2%  Position: Prone   Indications: 1. Chronic shoulder  pain (Fourth Area of Pain) (Bilateral) (L>R)   2. Osteoarthritis of shoulder (Bilateral)    Pain Score: Pre-procedure: 4 /10 Post-procedure: 0-No pain/10  Pre-op Assessment:  Lauren [name] is a 43 y.o. (year old), female patient, seen today for interventional treatment. She  has a past surgical history that includes Rotator cuff repair; Knee surgery; Cholecystectomy; Cesarean section (2010); Cesarean section (2011); Tonsillectomy; Cardiac electrophysiology study and ablation (01/18/2018); and Excision mass neck (Left, 10/11/2018). Lauren [name] has a current medication list which includes the following prescription(s): albuterol, aspirin ec, beclomethasone, esomeprazole, etodolac, fluticasone, furosemide, gabapentin, hydrocodone-acetaminophen, hydrocodone-acetaminophen, hydrocodone-acetaminophen, ibuprofen, losartan-hydrochlorothiazide, medroxyprogesterone acetate, metoprolol succinate, and stiolto respimat, and the following Facility-Administered Medications: fentanyl and midazolam. Her primarily concern today is the Knee Pain (bilateral) and Shoulder Pain (left)  Initial Vital Signs:  Pulse/HCG Rate: 95ECG Heart Rate: (!) 103 Temp: 98.8 F (37.1 C) Resp: 16 BP: (!) 118/93 SpO2: 99 %  BMI: Estimated body mass index is 58.7 kg/m as calculated from the following:   Height as of this encounter: 5\' 4"  (1.626 m).   Weight as of this encounter: 342 lb (155.1 kg).  Risk Assessment: Allergies: Reviewed. She is allergic to nucynta [tapentadol] and tape.  Allergy Precautions: None required Coagulopathies: Reviewed. None identified.  Blood-thinner therapy: None at this time Active Infection(s): Reviewed. None identified. Lauren [name] is afebrile  Site Confirmation: Lauren [name] was asked to confirm the procedure and laterality before marking the site Procedure checklist: Completed Consent: Before the procedure and under the influence of no sedative(s), amnesic(s), or anxiolytics, the patient  was informed of the treatment options, risks and possible complications. To fulfill our ethical and legal obligations, as recommended by the American Medical Association's Code of Ethics, I have informed the patient of my clinical impression; the nature and purpose of the treatment or  procedure; the risks, benefits, and possible complications of the intervention; the alternatives, including doing nothing; the risk(s) and benefit(s) of the alternative treatment(s) or procedure(s); and the risk(s) and benefit(s) of doing nothing. The patient was provided information about the general risks and possible complications associated with the procedure. These may include, but are not limited to: failure to achieve desired goals, infection, bleeding, organ or nerve damage, allergic reactions, paralysis, and death. In addition, the patient was informed of those risks and complications associated to the procedure, such as failure to decrease pain; infection; bleeding; organ or nerve damage with subsequent damage to sensory, motor, and/or autonomic systems, resulting in permanent pain, numbness, and/or weakness of one or several areas of the body; allergic reactions; (i.e.: anaphylactic reaction); and/or death. Furthermore, the patient was informed of those risks and complications associated with the medications. These include, but are not limited to: allergic reactions (i.e.: anaphylactic or anaphylactoid reaction(s)); adrenal axis suppression; blood sugar elevation that in diabetics may result in ketoacidosis or comma; water retention that in patients with history of congestive heart failure may result in shortness of breath, pulmonary edema, and decompensation with resultant heart failure; weight gain; swelling or edema; medication-induced neural toxicity; particulate matter embolism and blood vessel occlusion with resultant organ, and/or nervous system infarction; and/or aseptic necrosis of one or more joints. Finally,  the patient was informed that Medicine is not an exact science; therefore, there is also the possibility of unforeseen or unpredictable risks and/or possible complications that may result in a catastrophic outcome. The patient indicated having understood very clearly. We have given the patient no guarantees and we have made no promises. Enough time was given to the patient to ask questions, all of which were answered to the patient's satisfaction. Lauren [name] has indicated that she wanted to continue with the procedure. Attestation: I, the ordering provider, attest that I have discussed with the patient the benefits, risks, side-effects, alternatives, likelihood of achieving goals, and potential problems during recovery for the procedure that I have provided informed consent. Date   Time: 12/18/2018  8:05 AM  Pre-Procedure Preparation:  Monitoring: As per clinic protocol. Respiration, ETCO2, SpO2, BP, heart rate and rhythm monitor placed and checked for adequate function Safety Precautions: Patient was assessed for positional comfort and pressure points before starting the procedure. Time-out: I initiated and conducted the "Time-out" before starting the procedure, as per protocol. The patient was asked to participate by confirming the accuracy of the "Time Out" information. Verification of the correct person, site, and procedure were performed and confirmed by me, the nursing staff, and the patient. "Time-out" conducted as per Joint Commission's Universal Protocol (UP.01.01.01). Time: 0833  Description of Procedure #1:  Target Area: Knee Joint Approach: Just above the Lateral tibial plateau, lateral to the infrapatellar tendon. Area Prepped: Entire knee area, from the mid-thigh to the mid-shin. Prepping solution: ChloraPrep (2% chlorhexidine gluconate and 70% isopropyl alcohol) Safety Precautions: Aspiration looking for blood return was conducted prior to all injections. At no point did we inject any  substances, as a needle was being advanced. No attempts were made at seeking any paresthesias. Safe injection practices and needle disposal techniques used. Medications properly checked for expiration dates. SDV (single dose vial) medications used. Description of the Procedure: Protocol guidelines were followed. The patient was placed in position over the fluoroscopy table. The target area was identified and the area prepped in the usual manner. Skin & deeper tissues infiltrated with local anesthetic. Appropriate amount of time allowed  to pass for local anesthetics to take effect. The procedure needles were then advanced to the target area. Proper needle placement secured. Negative aspiration confirmed. Solution injected in intermittent fashion, asking for systemic symptoms every 0.5cc of injectate. The needles were then removed and the area cleansed, making sure to leave some of the prepping solution back to take advantage of its long term bactericidal properties. Vitals:   12/18/18 0906 12/18/18 0915 12/18/18 0926 12/18/18 0934  BP: (!) 146/98 (!) 110/96 110/84 127/82  Pulse: 87     Resp: 19 17 18 20   Temp:      SpO2: 97% 100% 100% 100%  Weight:      Height:        Start Time: 0833 hrs. End Time: 0905 hrs. Materials:  Needle(s) Type: Regular needle Gauge: 22G Length: 3.5-in Medication(s): Please see orders for medications and dosing details.  Imaging Guidance for procedure #1:  Type of Imaging Technique: Fluoroscopy Guidance (Non-spinal) Indication(s): Morbid obesity. Assistance in needle guidance and placement for procedures requiring needle placement in or near specific anatomical locations impossible to access without such assistance. Exposure Time: Please see nurses notes. Contrast: None used. Fluoroscopic Guidance: I was personally present during the use of fluoroscopy. "Tunnel Vision Technique" used to obtain the best possible view of the target area. Parallax error corrected before  commencing the procedure. "Direction-depth-direction" technique used to introduce the needle under continuous pulsed fluoroscopy. Once target was reached, antero-posterior, oblique, and lateral fluoroscopic projection used confirm needle placement in all planes. Images permanently stored in EMR. Ultrasound Guidance: N/A Interpretation: No contrast injected. I personally interpreted the imaging intraoperatively. Adequate needle placement confirmed in multiple planes. Permanent images saved into the patient's record.  Description of Procedure #2:  Area Prepped: Entire shoulder Area Prepping solution: ChloraPrep (2% chlorhexidine gluconate and 70% isopropyl alcohol) Safety Precautions: Aspiration looking for blood return was conducted prior to all injections. At no point did we inject any substances, as a needle was being advanced. No attempts were made at seeking any paresthesias. Safe injection practices and needle disposal techniques used. Medications properly checked for expiration dates. SDV (single dose vial) medications used. Description of the Procedure: Protocol guidelines were followed. The patient was placed in position over the procedure table. The target area was identified and the area prepped in the usual manner. Skin & deeper tissues infiltrated with local anesthetic. Appropriate amount of time allowed to pass for local anesthetics to take effect. The procedure needles were then advanced to the target area. Proper needle placement secured. Negative aspiration confirmed. Solution injected in intermittent fashion, asking for systemic symptoms every 0.5cc of injectate. The needles were then removed and the area cleansed, making sure to leave some of the prepping solution back to take advantage of its long term bactericidal properties.  Vitals:   12/18/18 0906 12/18/18 0915 12/18/18 0926 12/18/18 0934  BP: (!) 146/98 (!) 110/96 110/84 127/82  Pulse: 87     Resp: 19 17 18 20   Temp:      SpO2:  97% 100% 100% 100%  Weight:      Height:        Start Time: 0833 hrs. End Time: 0905 hrs. Materials:  Needle(s) Type: Spinal Needle Gauge: 22G Length: 3.5-in Medication(s): Please see orders for medications and dosing details.  Imaging Guidance (Non-Spinal) for procedure #2:  Type of Imaging Technique: Fluoroscopy Guidance (Non-Spinal) Indication(s): Assistance in needle guidance and placement for procedures requiring needle placement in or near specific anatomical locations not  easily accessible without such assistance. Exposure Time: Please see nurses notes. Contrast: Before injecting any contrast, we confirmed that the patient did not have an allergy to iodine, shellfish, or radiological contrast. Once satisfactory needle placement was completed at the desired level, radiological contrast was injected. Contrast injected under live fluoroscopy. No contrast complications. See chart for type and volume of contrast used. Fluoroscopic Guidance: I was personally present during the use of fluoroscopy. "Tunnel Vision Technique" used to obtain the best possible view of the target area. Parallax error corrected before commencing the procedure. "Direction-depth-direction" technique used to introduce the needle under continuous pulsed fluoroscopy. Once target was reached, antero-posterior, oblique, and lateral fluoroscopic projection used confirm needle placement in all planes. Images permanently stored in EMR. Interpretation: I personally interpreted the imaging intraoperatively. Adequate needle placement confirmed in multiple planes. Appropriate spread of contrast into desired area was observed. No evidence of afferent or efferent intravascular uptake. Permanent images saved into the patient's record.  Antibiotic Prophylaxis:   Anti-infectives (From admission, onward)   None     Indication(s): None identified  Post-operative Assessment:  Post-procedure Vital Signs:  Pulse/HCG Rate: 8790 Temp:  98.8 F (37.1 C) Resp: 20 BP: 127/82 SpO2: 100 %  EBL: None  Complications: No immediate post-treatment complications observed by team, or reported by patient.  Note: The patient tolerated the entire procedure well. A repeat set of vitals were taken after the procedure and the patient was kept under observation following institutional policy, for this type of procedure. Post-procedural neurological assessment was performed, showing return to baseline, prior to discharge. The patient was provided with post-procedure discharge instructions, including a section on how to identify potential problems. Should any problems arise concerning this procedure, the patient was given instructions to immediately contact us, at any time, without hesitation. In any case, we plan to contact the patient by telephone for a follow-up status report regarding this interventional procedure.  Comments:  No additional relevant information.  Plan of Care  Orders:  Orders Placed This Encounter  Procedures   KNEE INJECTION    Hyalgan knee injection to be done by MD.    Scheduling Instructions:     Procedure: Intra-articular Hyalgan Knee injection #1     Side(s): Bilateral Knee     Sedation: With Sedation.     Timeframe: Today    Order Specific Question:   Where will this procedure be performed?    Answer:   [facility]   KNEE INJECTION    Hyalgan knee injection. Please order Hyalgan.    Standing Status:   Future    Standing Expiration Date:   01/17/2019    Scheduling Instructions:     Procedure: Intra-articular Hyalgan Knee injection #2     Side: Bilateral     Sedation: With Sedation.     Timeframe: in two (2) weeks    Order Specific Question:   Where will this procedure be performed?    Answer:   [facility]   SUPRASCAPULAR NERVE BLOCK    For shoulder pain.    Scheduling Instructions:     Purpose: Palliative     Laterality: Left-sided     Level(s): Suprascapular notch     Sedation:  With Sedation.     Scheduling Timeframe: Today    Order Specific Question:   Where will this procedure be performed?    Answer:   [facility]   DG C-Arm 1-60 Min-No Report    Intraoperative interpretation by procedural physician  at [facility].    Standing Status:   Standing    Number of Occurrences:   1    Order Specific Question:   Reason for exam:    Answer:   Assistance in needle guidance and placement for procedures requiring needle placement in or near specific anatomical locations not easily accessible without such assistance.   Provider attestation of informed consent for procedure/surgical case    I, the ordering provider, attest that I have discussed with the patient the benefits, risks, side effects, alternatives, likelihood of achieving goals and potential problems during recovery for the procedure that I have provided informed consent.    Standing Status:   Standing    Number of Occurrences:   1   Informed Consent Details: Transcribe to consent form and obtain patient signature    Consent Attestation: I, the ordering provider, attest that I have discussed with the patient the benefits, risks, side-effects, alternatives, likelihood of achieving goals, and potential problems during recovery for the procedure that I have provided informed consent.    Standing Status:   Standing    Number of Occurrences:   1    Order Specific Question:   Procedure    Answer:   Left suprascapular nerve block under fluoroscopic guidance.    Order Specific Question:   Surgeon    Answer:   Kathlen Brunswick. 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 Dossie Arbour, MD    Order Specific Question:   Indication/Reason    Answer:   Bilateral knee osteoarthritis and arthralgia   Medications ordered for procedure: Meds ordered this encounter  Medications   lidocaine (XYLOCAINE) 2 % (with pres) injection 400 mg   lactated ringers infusion 1,000 mL   midazolam (VERSED) 5 MG/5ML injection 1-2 mg    Make sure Flumazenil is available in the pyxis when using this medication. If oversedation occurs, administer 0.2 mg IV over 15 sec. If after 45 sec no response, administer 0.2 mg again over 1 min; may repeat at 1 min intervals; not to exceed 4 doses (1 mg)   fentaNYL (SUBLIMAZE) injection 25-50 mcg    Make sure Narcan is available in the pyxis when using this medication. In the event of respiratory depression (RR< 8/min): Titrate NARCAN (naloxone) in increments of 0.1 to 0.2 mg IV at 2-3 minute intervals, until desired degree of reversal.   lidocaine (PF) (XYLOCAINE) 1 % injection 5 mL   ropivacaine (PF) 2 mg/mL (0.2%) (NAROPIN) injection 5 mL   Sodium Hyaluronate SOSY 2 mL   Sodium Hyaluronate SOSY 2 mL   ropivacaine (PF) 2 mg/mL (0.2%) (NAROPIN) injection 4 mL   methylPREDNISolone acetate (DEPO-MEDROL) injection 80 mg   Medications administered: We administered lidocaine, lactated ringers, midazolam, fentaNYL, lidocaine (PF), ropivacaine (PF) 2 mg/mL (0.2%), Sodium Hyaluronate, Sodium Hyaluronate, ropivacaine (PF) 2 mg/mL (0.2%), and methylPREDNISolone acetate.  See the medical record for exact dosing, route, and time of administration.  Disposition: Discharge home  Discharge Date & Time: 12/18/2018;  0940 hrs.   Follow-up plan:   Return in about 2 weeks (around 01/01/2019) for Procedure (no sedation): (B) Hyalgan #2 (w/ Fluoro).     Future Appointments  Date Time Provider [place]  12/26/2018  9:15 AM Milinda Pointer, MD [facility] None  02/28/2019 11:00 AM Vevelyn Francois, NP [facility] None  05/13/2019  3:00 PM Lawhorn, Lara Mulch, CNM [facility] None   Primary Care Physician: Hubbard Hartshorn, FNP Location: [facility] Note by: Gaspar Cola, MD Date: 12/18/2018; Time: 9:59 AM  Disclaimer:  Medicine is not an Chief Strategy Officer. The only guarantee in medicine is that nothing is guaranteed. It is important to note that the decision to proceed with this intervention was based on the information collected from the patient. The Data and conclusions were drawn from the patient's questionnaire, the interview, and the physical examination. Because the information was provided in large part by the patient, it cannot be guaranteed that it has not been purposely or unconsciously manipulated. Every effort has been made to obtain as much relevant data as possible for this evaluation. It is important to note that the conclusions that lead to this procedure are derived in large part from the available data. Always take into account that the treatment will also be dependent on availability of resources and existing treatment guidelines, considered by other Pain Management Practitioners as being common knowledge and practice, at the time of the intervention. For Medico-Legal purposes, it is also important to point out that variation in procedural techniques and pharmacological choices are the acceptable norm. The indications, contraindications, technique, and results of the above procedure should only be interpreted and judged by a Board-Certified Interventional Pain Specialist with extensive familiarity and expertise in the same exact procedure and technique.

## 2018-12-18 NOTE — Progress Notes (Signed)
Safety precautions to be maintained throughout the outpatient stay will include: orient to surroundings, keep bed in low position, maintain call bell within reach at all times, provide assistance with transfer out of bed and ambulation.  

## 2018-12-18 NOTE — Patient Instructions (Signed)

## 2018-12-19 ENCOUNTER — Telehealth: Payer: Self-pay

## 2018-12-19 NOTE — Telephone Encounter (Signed)
Post procedure phone call.  Mailbox full so unable to leave message.

## 2018-12-26 ENCOUNTER — Ambulatory Visit: Payer: Medicaid Other | Attending: Pain Medicine | Admitting: Pain Medicine

## 2018-12-26 ENCOUNTER — Other Ambulatory Visit: Payer: Self-pay

## 2018-12-26 DIAGNOSIS — M545 Low back pain, unspecified: Secondary | ICD-10-CM

## 2018-12-26 DIAGNOSIS — G894 Chronic pain syndrome: Secondary | ICD-10-CM

## 2018-12-26 DIAGNOSIS — M25511 Pain in right shoulder: Secondary | ICD-10-CM

## 2018-12-26 DIAGNOSIS — M170 Bilateral primary osteoarthritis of knee: Secondary | ICD-10-CM

## 2018-12-26 DIAGNOSIS — M25561 Pain in right knee: Secondary | ICD-10-CM

## 2018-12-26 DIAGNOSIS — M25512 Pain in left shoulder: Secondary | ICD-10-CM

## 2018-12-26 DIAGNOSIS — G8929 Other chronic pain: Secondary | ICD-10-CM | POA: Insufficient documentation

## 2018-12-26 DIAGNOSIS — Z6841 Body Mass Index (BMI) 40.0 and over, adult: Secondary | ICD-10-CM

## 2018-12-26 DIAGNOSIS — D170 Benign lipomatous neoplasm of skin and subcutaneous tissue of head, face and neck: Secondary | ICD-10-CM | POA: Insufficient documentation

## 2018-12-26 DIAGNOSIS — M542 Cervicalgia: Secondary | ICD-10-CM

## 2018-12-26 DIAGNOSIS — D1722 Benign lipomatous neoplasm of skin and subcutaneous tissue of left arm: Secondary | ICD-10-CM

## 2018-12-26 DIAGNOSIS — M25562 Pain in left knee: Secondary | ICD-10-CM

## 2018-12-26 HISTORY — DX: Pain in left shoulder: M25.512

## 2018-12-26 NOTE — Patient Instructions (Signed)
____________________________________________________________________________________________  Preparing for your procedure (without sedation)  Procedure appointments are limited to planned procedures: . No Prescription Refills. . No disability issues will be discussed. . No medication changes will be discussed.  Instructions: . Oral Intake: Do not eat or drink anything for at least 3 hours prior to your procedure. . Transportation: Unless otherwise stated by your physician, you may drive yourself after the procedure. . Blood Pressure Medicine: Take your blood pressure medicine with a sip of water the morning of the procedure. . Blood thinners: Notify our staff if you are taking any blood thinners. Depending on which one you take, there will be specific instructions on how and when to stop it. . Diabetics on insulin: Notify the staff so that you can be scheduled 1st case in the morning. If your diabetes requires high dose insulin, take only  of your normal insulin dose the morning of the procedure and notify the staff that you have done so. . Preventing infections: Shower with an antibacterial soap the morning of your procedure.  . Build-up your immune system: Take 1000 mg of Vitamin C with every meal (3 times a day) the day prior to your procedure. . Antibiotics: Inform the staff if you have a condition or reason that requires you to take antibiotics before dental procedures. . Pregnancy: If you are pregnant, call and cancel the procedure. . Sickness: If you have a cold, fever, or any active infections, call and cancel the procedure. . Arrival: You must be in the facility at least 30 minutes prior to your scheduled procedure. . Children: Do not bring any children with you. . Dress appropriately: Bring dark clothing that you would not mind if they get stained. . Valuables: Do not bring any jewelry or valuables.  Reasons to call and reschedule or cancel your procedure: (Following these  recommendations will minimize the risk of a serious complication.) . Surgeries: Avoid having procedures within 2 weeks of any surgery. (Avoid for 2 weeks before or after any surgery). . Flu Shots: Avoid having procedures within 2 weeks of a flu shots or . (Avoid for 2 weeks before or after immunizations). . Barium: Avoid having a procedure within 7-10 days after having had a radiological study involving the use of radiological contrast. (Myelograms, Barium swallow or enema study). . Heart attacks: Avoid any elective procedures or surgeries for the initial 6 months after a "Myocardial Infarction" (Heart Attack). . Blood thinners: It is imperative that you stop these medications before procedures. Let us know if you if you take any blood thinner.  . Infection: Avoid procedures during or within two weeks of an infection (including chest colds or gastrointestinal problems). Symptoms associated with infections include: Localized redness, fever, chills, night sweats or profuse sweating, burning sensation when voiding, cough, congestion, stuffiness, runny nose, sore throat, diarrhea, nausea, vomiting, cold or Flu symptoms, recent or current infections. It is specially important if the infection is over the area that we intend to treat. . Heart and lung problems: Symptoms that may suggest an active cardiopulmonary problem include: cough, chest pain, breathing difficulties or shortness of breath, dizziness, ankle swelling, uncontrolled high or unusually low blood pressure, and/or palpitations. If you are experiencing any of these symptoms, cancel your procedure and contact your primary care physician for an evaluation.  Remember:  Regular Business hours are:  Monday to Thursday 8:00 AM to 4:00 PM  Provider's Schedule: Deari Sessler, MD:  Procedure days: Tuesday and Thursday 7:30 AM to 4:00 PM  Bilal   Lateef, MD:  Procedure days: Monday and Wednesday 7:30 AM to 4:00  PM ____________________________________________________________________________________________    

## 2018-12-26 NOTE — Progress Notes (Signed)
Pain Management Virtual Encounter Note - Virtual Visit via Telephone Telehealth (real-time audio visits between healthcare provider and patient).  Patient's Phone No. & Preferred Pharmacy:  [phone] (home); [phone] (mobile); (Preferred) [phone]  [id number] [name], [place] [address] Phone: [phone] Fax: [id number] [name], [place] [address] [place] [address] Phone: [phone] Fax: [phone]   Pre-screening note:  Our staff contacted [name] and offered her an "in person", "face-to-face" appointment versus a telephone encounter. She indicated preferring the telephone encounter, at this time.  Reason for Virtual Visit: COVID-19*  Social distancing based on CDC and AMA recommendations.   I attempted to contact Lauren [name] on 12/26/2018 at 10:27 AM, and again at 1:30 PM by telephone.  On the second attempt I was able to contact her and clearly identified myself as Gaspar Cola, MD. I verified that I was speaking with the correct person using two identifiers (Name and date of birth: [birth year]).  Advanced Informed Consent I sought verbal advanced consent from [place] for telemedicine interactions and virtual visit. I informed [name] of the security and privacy concerns, risks, and limitations associated with performing an evaluation and management service by telephone. I also informed [name] of the availability of "in person" appointments and I informed her of the possibility of a patient responsible charge related to this service. Ms. Garrod expressed understanding and agreed to proceed.   Historic Elements   [name] is a 43 y.o. year old, female patient evaluated today after her last encounter by our practice on 12/19/2018. [name]  has a past medical history of  Advanced maternal age in multigravida, Asthma, Depression, GERD (gastroesophageal reflux disease), Grand multiparity, Headache, History of carpal tunnel surgery of left wrist (05/2018), Hypertension, Leg fracture, Obesity, morbid, BMI 40.0-49.9 ([place]), Post traumatic stress disorder, Pre-diabetes, Pre-eclampsia, SVT (supraventricular tachycardia) ([place]), and Wrist fracture, closed. She also  has a past surgical history that includes Rotator cuff repair; Knee surgery; Cholecystectomy; Cesarean section (2010); Cesarean section (2011); Tonsillectomy; Cardiac electrophysiology study and ablation (01/18/2018); and Excision mass neck (Left, 10/11/2018). Ms. Derossett has a current medication list which includes the following prescription(s): albuterol, aspirin ec, beclomethasone, esomeprazole, etodolac, fluticasone, furosemide, gabapentin, hydrocodone-acetaminophen, hydrocodone-acetaminophen, hydrocodone-acetaminophen, ibuprofen, losartan-hydrochlorothiazide, medroxyprogesterone acetate, metoprolol succinate, and stiolto respimat. She  reports that she has never smoked. She has never used smokeless tobacco. She reports that she does not drink alcohol or use drugs. [name] is allergic to nucynta [tapentadol] and tape.   HPI  I last saw her on 12/18/2018. She is being evaluated for a post-procedure assessment.  The patient indicates that in the case of the right knee, the pain is almost completely gone.  She only has pain under certain circumstances and with certain movements.  Unfortunately, in the case of the left knee the same is not true.  She continues to have significant pain and today I have reviewed her MRI which indicates that in the lateral portion of the knee she appears to have had some loss of cartilage.  I have explained this to the patient in layman's terms and I have also explained to her that she may require a total knee replacement on that side.  We talked about options including the possibility of a  diagnostic genicular nerve block and follow-up with radiofrequency.  However, I make sure that she understood that the procedure would not give her permanent relief of the pain and that it may help her with the pain but it would not help her with instability of the knee if she is having problems with that.  In addition to this, the patient indicates that in the case of the left shoulder, even though he has improved considerably, she still having a lot of difficulty with it.  She has now identified a new problem where when she lifts the arm she gets excruciating pain in the shoulder and she has difficulty lifting the arm.  She describes clinical signs and symptoms that may be compatible with a possible rotator cuff injury.  She indicates that that is new.  I have reviewed her prior x-rays and they do not seem to contribute significantly to explain the pathology that she is currently describing.  Because of this, I will be ordering an MRI of the shoulder to evaluate for soft tissue injury or pathology, such as that seen with rotator cuff injuries.  For now, the patient is well aware of the COVID-19 restrictions and she knows that she may not be able to get the MRI or the knee injections done until they are lifted.  In addition to the above, the patient indicates that even though she had her shoulder and neck lipoma removed around October 12, 2018, she seems to be experiencing a recurrence of this lipoma.  I have informed the patient that that possible, but typically would take a little longer for it to occur.  I asked her about the possibility of redness, swelling, or any other problems that suggest the possibility of infection or a seroma.  She denies any redness, increased temperature, or swelling.  She did experience some of that immediately after the surgery and she indicates that she talked to her surgeon about it and they indicated that it could be some scar tissue.  To further look into this, I have ordered  an ultrasound of the area to see if we are dealing with fluid, scar tissue, or a true recurrence of the lipoma.  Post-Procedure Evaluation  Procedure: Diagnostic left suprascapular nerve block + therapeutic bilateral intra-articular Hyalgan knee injection #1 (series #2) under fluoroscopic guidance and IV sedation Pre-procedure pain level:  4/10 Post-procedure: 0/10 (100% relief)  Sedation: Sedation provided.  Effectiveness during initial hour after procedure(Ultra-Short Term Relief): 100 %  Local anesthetic used: Long-acting (4-6 hours) Effectiveness: Defined as any analgesic benefit obtained secondary to the administration of local anesthetics. This carries significant diagnostic value as to the etiological location, or anatomical origin, of the pain. Duration of benefit is expected to coincide with the duration of the local anesthetic used.  Effectiveness during initial 4-6 hours after procedure(Short-Term Relief): 100 %  Long-term benefit: Defined as any relief past the pharmacologic duration of the local anesthetics.  Effectiveness past the initial 6 hours after procedure(Long-Term Relief): 50-75 %  Current benefits: Defined as benefit that persist at this time.   Analgesia:  50-75% improvement.  The right knee has improved significantly more than the left and the left shoulder daily improve, but she has noticed new problems that she had not noticed before and that would suggest the possibility of a rotator cuff injury. Function: Somewhat improved ROM: Somewhat improved  Pharmacotherapy Assessment  Analgesic: Hydrocodone/APAP 5/325 1 tablet p.o. every 8 hours (15 mg/day of hydrocodone) MME/day: 15 mg/day.   Monitoring: Pharmacotherapy: No side-effects or adverse reactions reported. [place]  PMP: PDMP reviewed during this encounter.       Compliance: No problems identified or detected. Plan: Refer to "POC".  Review of recent tests  DG C-Arm 1-60 Min-No Report Fluoroscopy was utilized by  the requesting physician.  No radiographic  interpretation.    Admission on 10/11/2018, Discharged on 10/11/2018  Component Date Value Ref Range Status  . Preg Test, Ur 10/11/2018 NEGATIVE  NEGATIVE Final   Comment:        THE SENSITIVITY OF THIS METHODOLOGY IS >24 mIU/mL   . SURGICAL PATHOLOGY 10/11/2018    Final                   Value:Surgical Pathology CASE: [id number] PATIENT: Lauren [name] Surgical Pathology Report     SPECIMEN SUBMITTED: A. Neck mass, left  CLINICAL HISTORY: None provided  PRE-OPERATIVE DIAGNOSIS: Left neck lump  POST-OPERATIVE DIAGNOSIS: Same as pre op     DIAGNOSIS: A. SOFT TISSUE MASS, NECK, LEFT; EXCISION: - MATURE LOBULATED ADIPOSE TISSUE CONSISTENT WITH LIPOMA. - SMALL BENIGN LYMPH NODE.   GROSS DESCRIPTION: A. Labeled: Left neck mass Received: Formalin Tissue fragment(s): 1 Size: 3.7 x 2.5 x 1.2 cm Description: Received is an irregular, unoriented fragment of tan-yellow adipose tissue.  The surgical resection margin is inked blue. Sectioning displays a tan-yellow, fatty cut surface with no abnormalities or mass lesions grossly identified. Representative sections are submitted in cassettes 1-2.   Final Diagnosis performed by Raynelle Bring, MD.   Electronically signed 10/12/2018 1:04:37PM The electronic signature indicates that the named Attendin                         g Pathologist has evaluated the specimen  Technical component performed at [facility], [address], [place], [place] [address] Lab: [phone] Dir: Rush Farmer, MD, MMM  Professional component performed at [facility], [facility] [place], [place], [place] [address] Lab: [phone] Dir: Dellia Nims. Rubinas, MD    Assessment  The primary encounter diagnosis was Chronic pain syndrome. Diagnoses of Chronic knee pain (Primary Area of Pain) (Bilateral) (L>R), Osteoarthritis of knee (Bilateral), Chronic neck pain (Tertiary Area of Pain)  (Bilateral) (L>R), Chronic shoulder pain (Fourth Area of Pain) (Bilateral) (L>R), Acute pain of left shoulder, Chronic low back pain (Fifth Area of Pain) (Bilateral) (R>L) w/o sciatica, Morbid obesity with BMI of 50.0-59.9, adult (HCC), Lipoma of neck (Left), and Lipoma of shoulder (Left) were also pertinent to this visit.  Plan of Care  I am having Dannon N. Fooks maintain her aspirin EC, furosemide, metoprolol succinate, losartan-hydrochlorothiazide, Stiolto Respimat, esomeprazole, albuterol, beclomethasone, fluticasone, ibuprofen, HYDROcodone-acetaminophen, HYDROcodone-acetaminophen, HYDROcodone-acetaminophen, etodolac, gabapentin, and medroxyPROGESTERone Acetate.  Pharmacotherapy (Medications Ordered): No orders of the defined types were placed in this encounter.  Orders:  Orders Placed This Encounter  Procedures  . KNEE INJECTION    Hyalgan knee injection. Please order Hyalgan.    Standing Status:   Future    Standing Expiration Date:   01/25/2019    Scheduling Instructions:     Procedure: Intra-articular Hyalgan Knee injection #2     Side: Bilateral     Sedation: None     Timeframe: ASAA    Order Specific Question:   Where will this procedure be performed?    Answer:   [facility]  . GENICULAR NERVE BLOCK    For knee pain.    Standing Status:   Standing    Number of Occurrences:   1  Standing Expiration Date:   12/26/2019    Scheduling Instructions:     Side(s): Left Knee     Level(s): Superior-Lateral, Superior-Medial, and Inferior-Medial Genicular Nerves     Sedation: With Sedation.     TIMEFRAME: PRN procedure. (Ms. Balthazar will call when needed.)    Order Specific Question:   Where will this procedure be performed?    Answer:   [facility]  . MR SHOULDER LEFT WO CONTRAST    Please evaluate for rotator cuff pathology at the patient presents with clinical evidence leading to high suspicion of rotator cuff injury.    Standing Status:   Future     Standing Expiration Date:   04/27/2019    Order Specific Question:   What is the patient's sedation requirement?    Answer:   No Sedation    Order Specific Question:   Does the patient have a pacemaker or implanted devices?    Answer:   No    Order Specific Question:   Preferred imaging location?    Answer:   [facility] (table limit-550lbs)    Order Specific Question:   Call Results- Best Contact Number?    Answer:   [phone]    Order Specific Question:   Radiology Contrast Protocol - do NOT remove file path    Answer:   [email or web address]  . US SOFT TISSUE NECK    Please evaluate the area where the patient had her lipoma removed on October 12, 2018.  She indicates recurrence of the lipoma with subsequent recurrence of the shoulder and neck pain.  Please evaluate to see if this is truly a recurrence of the lipoma or if she has a seroma after the surgery or scar tissue.    Standing Status:   Future    Standing Expiration Date:   04/27/2019    Order Specific Question:   Reason for Exam (SYMPTOM  OR DIAGNOSIS REQUIRED)    Answer:   Shoulder and neck pain with questionable recurrence of lipoma which was removed around January 31 of this year.    Order Specific Question:   Preferred imaging location?    Answer:   [facility]    Order Specific Question:   Call Results- Best Contact Number?    Answer:   [phone]  . US SOFT TISSUE UPPER EXTREMITY LIMITED LEFT (NON-VASCULAR)    Please evaluate if the patient is really having a recurrence of the lipoma that was removed on January 31 or if she is having some other type of problem such as fluid accumulation or scar tissue from the surgery.    Standing Status:   Future    Standing Expiration Date:   04/27/2019    Order Specific Question:   Reason for Exam (SYMPTOM  OR DIAGNOSIS REQUIRED)    Answer:   Apparent recurrence of lipoma which was removed on January 31.    Order Specific Question:   Preferred  imaging location?    Answer:   [facility]  . Schedule appointment    Scheduling Instructions:     Schedule the patient to have all medication management appointments and medication refills with Janice Norrie, NP   Follow-up plan:   Return for Med-Mgmt w/ NP +  Procedure (no sedation): (B) Hyalgan #2 (Series 2).   I discussed the assessment and treatment plan with the patient. The patient was provided an opportunity to ask questions and all were answered. The patient agreed with the plan and demonstrated an  understanding of the instructions.  Patient advised to call back or seek an in-person evaluation if the symptoms or condition worsens.  Total duration of non-face-to-face encounter: 40 minutes.  Note by: Gaspar Cola, MD Date: 12/26/2018; Time: 2:21 PM  Disclaimer:  * Given the special circumstances of the COVID-19 pandemic, the federal government has announced that the Office for Civil Rights (OCR) will exercise its enforcement discretion and will not impose penalties on physicians using telehealth in the event of noncompliance with regulatory requirements under the [place] and Accountability Act (HIPAA) in connection with the good faith provision of telehealth during the TKTCC-88 national public health emergency. ([place])

## 2019-01-14 ENCOUNTER — Telehealth: Payer: Self-pay

## 2019-01-14 NOTE — Telephone Encounter (Signed)
done

## 2019-01-14 NOTE — Telephone Encounter (Signed)
The patient left a vm stating her pain medications need prior authorization

## 2019-01-23 ENCOUNTER — Telehealth: Payer: Self-pay | Admitting: Pain Medicine

## 2019-01-23 NOTE — Telephone Encounter (Signed)
Pt called stating that when she filled her vicodin rx that the pharmacy only had 60 pills and she usually gets 90. She states that the pharmacy sent over a request for a new rx for the remaining 30 pills but hasn't heard anything.

## 2019-01-24 NOTE — Telephone Encounter (Signed)
Have you seen this request? Can you please call Rx and make them aware that they should not do this because in the future this will not be policy. Thanks Find out what you can.

## 2019-01-24 NOTE — Telephone Encounter (Signed)
Patient will be out of meds on 01/31/19

## 2019-01-24 NOTE — Telephone Encounter (Signed)
[facility]oke with [facility] representative to confirm that qty [facility] was filled for May 1 because they did not have sufficient quantity.

## 2019-01-25 ENCOUNTER — Other Ambulatory Visit: Payer: Self-pay | Admitting: Pain Medicine

## 2019-01-28 ENCOUNTER — Other Ambulatory Visit: Payer: Self-pay | Admitting: Nurse Practitioner

## 2019-01-28 MED ORDER — HYDROCODONE-ACETAMINOPHEN 5-325 MG PO TABS: 1.0000 | ORAL_TABLET | Freq: Three times a day (TID) | ORAL | 0 refills | Status: DC | PRN

## 2019-01-28 NOTE — Telephone Encounter (Signed)
Attempted to call patient and notify her of above.  Unable to leave message because mailbox was full.

## 2019-01-28 NOTE — Telephone Encounter (Signed)
Please make pt aware that this was sent. Thanks

## 2019-01-30 ENCOUNTER — Other Ambulatory Visit: Payer: Medicaid Other

## 2019-02-11 ENCOUNTER — Other Ambulatory Visit: Payer: Self-pay

## 2019-02-11 DIAGNOSIS — Z01818 Encounter for other preprocedural examination: Secondary | ICD-10-CM

## 2019-02-15 ENCOUNTER — Other Ambulatory Visit: Payer: Self-pay

## 2019-02-15 ENCOUNTER — Other Ambulatory Visit
Admission: RE | Admit: 2019-02-15 | Discharge: 2019-02-15 | Disposition: A | Discharge status: Home / Self Care | Payer: Medicaid Other | Source: Ambulatory Visit | Attending: Pain Medicine | Admitting: Pain Medicine

## 2019-02-15 DIAGNOSIS — Z01812 Encounter for preprocedural laboratory examination: Secondary | ICD-10-CM | POA: Diagnosis present

## 2019-02-15 DIAGNOSIS — Z1159 Encounter for screening for other viral diseases: Secondary | ICD-10-CM | POA: Diagnosis not present

## 2019-02-16 LAB — NOVEL CORONAVIRUS, NAA (HOSP ORDER, SEND-OUT TO REF LAB; TAT 18-24 HRS): SARS-CoV-2, NAA: NOT DETECTED

## 2019-02-19 ENCOUNTER — Ambulatory Visit (HOSPITAL_BASED_OUTPATIENT_CLINIC_OR_DEPARTMENT_OTHER): Payer: Medicaid Other | Admitting: Pain Medicine

## 2019-02-19 ENCOUNTER — Encounter: Payer: Self-pay | Admitting: Pain Medicine

## 2019-02-19 ENCOUNTER — Ambulatory Visit
Admission: RE | Admit: 2019-02-19 | Discharge: 2019-02-19 | Disposition: A | Discharge status: Home / Self Care | Payer: Medicaid Other | Source: Ambulatory Visit | Attending: Pain Medicine | Admitting: Pain Medicine

## 2019-02-19 ENCOUNTER — Other Ambulatory Visit: Payer: Self-pay

## 2019-02-19 VITALS — BP 134/80 | HR 89 | Temp 97.4°F | Resp 17 | Ht 64.0 in | Wt 350.0 lb

## 2019-02-19 DIAGNOSIS — M25561 Pain in right knee: Secondary | ICD-10-CM | POA: Diagnosis present

## 2019-02-19 DIAGNOSIS — M25562 Pain in left knee: Secondary | ICD-10-CM | POA: Diagnosis present

## 2019-02-19 DIAGNOSIS — G8929 Other chronic pain: Secondary | ICD-10-CM | POA: Diagnosis present

## 2019-02-19 DIAGNOSIS — M170 Bilateral primary osteoarthritis of knee: Secondary | ICD-10-CM | POA: Insufficient documentation

## 2019-02-19 MED ORDER — LIDOCAINE HCL 2 % IJ SOLN
20.0000 mL | Freq: Once | INTRAMUSCULAR | Status: AC
  Administered 2019-02-19: 400 mg
  Filled 2019-02-19: qty 40

## 2019-02-19 MED ORDER — FENTANYL CITRATE (PF) 100 MCG/2ML IJ SOLN
25.0000 ug | INTRAMUSCULAR | Status: DC | PRN
  Administered 2019-02-19: 100 ug via INTRAVENOUS
  Filled 2019-02-19: qty 2

## 2019-02-19 MED ORDER — METHYLPREDNISOLONE ACETATE 80 MG/ML IJ SUSP
80.0000 mg | Freq: Once | INTRAMUSCULAR | Status: AC
  Administered 2019-02-19: 80 mg
  Filled 2019-02-19: qty 1

## 2019-02-19 MED ORDER — LACTATED RINGERS IV SOLN
1000.0000 mL | Freq: Once | INTRAVENOUS | Status: AC
  Administered 2019-02-19: 1000 mL via INTRAVENOUS

## 2019-02-19 MED ORDER — ROPIVACAINE HCL 2 MG/ML IJ SOLN
18.0000 mL | Freq: Once | INTRAMUSCULAR | Status: AC
  Administered 2019-02-19: 18 mL via PERINEURAL
  Filled 2019-02-19: qty 20

## 2019-02-19 MED ORDER — MIDAZOLAM HCL 5 MG/5ML IJ SOLN
1.0000 mg | INTRAMUSCULAR | Status: DC | PRN
  Administered 2019-02-19: 4 mg via INTRAVENOUS
  Filled 2019-02-19: qty 5

## 2019-02-19 NOTE — Patient Instructions (Signed)

## 2019-02-19 NOTE — Progress Notes (Signed)
Patient's Name: Lauren [name]  MRN: [id number]  Referring Provider: Hubbard Hartshorn, FNP  DOB: 03-Sep-1976  PCP: Hubbard Hartshorn, FNP  DOS: 02/19/2019  Note by: Gaspar Cola, MD  Service setting: Ambulatory outpatient  Specialty: Interventional Pain Management  Patient type: Established  Location: [facility] (AMB) Pain Management Facility  Visit type: Interventional Procedure   Primary Reason for Visit: Interventional Pain Management Treatment. CC: Knee Pain (both)  Procedure:          Anesthesia, Analgesia, Anxiolysis:  Type: Genicular Nerves Block (Superior-lateral, Superior-medial, and Inferior-medial Genicular Nerves) #1  CPT: 64450-50 Primary Purpose: Diagnostic Region: Lateral, Anterior, and Medial aspects of the knee joint, above and below the knee joint proper. Level: Superior and inferior to the knee joint. Target Area: For Genicular Nerve block(s), the targets are: the superior-lateral genicular nerve, located in the lateral distal portion of the femoral shaft as it curves to form the lateral epicondyle, in the region of the distal femoral metaphysis; the superior-medial genicular nerve, located in the medial distal portion of the femoral shaft as it curves to form the medial epicondyle; and the inferior-medial genicular nerve, located in the medial, proximal portion of the tibial shaft, as it curves to form the medial epicondyle, in the region of the proximal tibial metaphysis. Approach: Anterior, percutaneous, ipsilateral approach. Laterality: Bilateral  Type: Moderate (Conscious) Sedation combined with Local Anesthesia Indication(s): Analgesia and Anxiety Route: Intravenous (IV) IV Access: Secured Sedation: Meaningful verbal contact was maintained at all times during the procedure  Local Anesthetic: Lidocaine 1-2%  Position: Modified Fowler's position with pillows under the targeted knee(s).   Indications: 1. Osteoarthritis of knee (Bilateral)   2. Chronic knee pain (Primary  Area of Pain) (Bilateral) (L>R)    Pain Score: Pre-procedure: 3 /10 Post-procedure: 0-No pain/10  Pre-op Assessment:  Lauren [name] is a 43 y.o. (year old), female patient, seen today for interventional treatment. She  has a past surgical history that includes Rotator cuff repair; Knee surgery; Cholecystectomy; Cesarean section (2010); Cesarean section (2011); Tonsillectomy; Cardiac electrophysiology study and ablation (01/18/2018); and Excision mass neck (Left, 10/11/2018). Lauren [name] has a current medication list which includes the following prescription(s): albuterol, aspirin ec, beclomethasone, esomeprazole, fluticasone, furosemide, gabapentin, hydrocodone-acetaminophen, ibuprofen, losartan-hydrochlorothiazide, medroxyprogesterone acetate, metoprolol succinate, stiolto respimat, and etodolac, and the following Facility-Administered Medications: fentanyl, lactated ringers, and midazolam. Her primarily concern today is the Knee Pain (both)  Initial Vital Signs:  Pulse/HCG Rate: 89ECG Heart Rate: (!) 105 Temp: 98.2 F (36.8 C) Resp: (!) 28 BP: (!) 146/102 SpO2: 100 %  BMI: Estimated body mass index is 60.08 kg/m as calculated from the following:   Height as of this encounter: 5\' 4"  (1.626 m).   Weight as of this encounter: 350 lb (158.8 kg).  Risk Assessment: Allergies: Reviewed. She is allergic to nucynta [tapentadol] and tape.  Allergy Precautions: None required Coagulopathies: Reviewed. None identified.  Blood-thinner therapy: None at this time Active Infection(s): Reviewed. None identified. Lauren [name] is afebrile  Site Confirmation: Lauren [name] was asked to confirm the procedure and laterality before marking the site Procedure checklist: Completed Consent: Before the procedure and under the influence of no sedative(s), amnesic(s), or anxiolytics, the patient was informed of the treatment options, risks and possible complications. To fulfill our ethical and legal  obligations, as recommended by the American Medical Association's Code of Ethics, I have informed the patient of my clinical impression; the nature and purpose of the treatment or procedure; the risks, benefits, and possible complications  of the intervention; the alternatives, including doing nothing; the risk(s) and benefit(s) of the alternative treatment(s) or procedure(s); and the risk(s) and benefit(s) of doing nothing. The patient was provided information about the general risks and possible complications associated with the procedure. These may include, but are not limited to: failure to achieve desired goals, infection, bleeding, organ or nerve damage, allergic reactions, paralysis, and death. In addition, the patient was informed of those risks and complications associated to the procedure, such as failure to decrease pain; infection; bleeding; organ or nerve damage with subsequent damage to sensory, motor, and/or autonomic systems, resulting in permanent pain, numbness, and/or weakness of one or several areas of the body; allergic reactions; (i.e.: anaphylactic reaction); and/or death. Furthermore, the patient was informed of those risks and complications associated with the medications. These include, but are not limited to: allergic reactions (i.e.: anaphylactic or anaphylactoid reaction(s)); adrenal axis suppression; blood sugar elevation that in diabetics may result in ketoacidosis or comma; water retention that in patients with history of congestive heart failure may result in shortness of breath, pulmonary edema, and decompensation with resultant heart failure; weight gain; swelling or edema; medication-induced neural toxicity; particulate matter embolism and blood vessel occlusion with resultant organ, and/or nervous system infarction; and/or aseptic necrosis of one or more joints. Finally, the patient was informed that Medicine is not an exact science; therefore, there is also the possibility of  unforeseen or unpredictable risks and/or possible complications that may result in a catastrophic outcome. The patient indicated having understood very clearly. We have given the patient no guarantees and we have made no promises. Enough time was given to the patient to ask questions, all of which were answered to the patient's satisfaction. Ms. Zanella has indicated that she wanted to continue with the procedure. Attestation: I, the ordering provider, attest that I have discussed with the patient the benefits, risks, side-effects, alternatives, likelihood of achieving goals, and potential problems during recovery for the procedure that I have provided informed consent. Date   Time: 02/19/2019  1:28 PM  Pre-Procedure Preparation:  Monitoring: As per clinic protocol. Respiration, ETCO2, SpO2, BP, heart rate and rhythm monitor placed and checked for adequate function Safety Precautions: Patient was assessed for positional comfort and pressure points before starting the procedure. Time-out: I initiated and conducted the "Time-out" before starting the procedure, as per protocol. The patient was asked to participate by confirming the accuracy of the "Time Out" information. Verification of the correct person, site, and procedure were performed and confirmed by me, the nursing staff, and the patient. "Time-out" conducted as per Joint Commission's Universal Protocol (UP.01.01.01). Time: 1418  Description of Procedure:          Area Prepped: Entire knee area, from mid-thigh to mid-shin, lateral, anterior, and medial aspects. Prepping solution: DuraPrep (Iodine Povacrylex [0.7% available iodine] and Isopropyl Alcohol, 74% w/w) Safety Precautions: Aspiration looking for blood return was conducted prior to all injections. At no point did we inject any substances, as a needle was being advanced. No attempts were made at seeking any paresthesias. Safe injection practices and needle disposal techniques used.  Medications properly checked for expiration dates. SDV (single dose vial) medications used. Description of the Procedure: Protocol guidelines were followed. The patient was placed in position over the procedure table. The target area was identified and the area prepped in the usual manner. Skin & deeper tissues infiltrated with local anesthetic. Appropriate amount of time allowed to pass for local anesthetics to take effect. The procedure  needles were then advanced to the target area. Proper needle placement secured. Negative aspiration confirmed. Solution injected in intermittent fashion, asking for systemic symptoms every 0.5cc of injectate. The needles were then removed and the area cleansed, making sure to leave some of the prepping solution back to take advantage of its long term bactericidal properties.  Vitals:   02/19/19 1432 02/19/19 1435 02/19/19 1446 02/19/19 1456  BP: 131/82 120/78 121/74 134/80  Pulse:      Resp: 18 13 18 17   Temp:  97.7 F (36.5 C)  (!) 97.4 F (36.3 C)  SpO2: 100% 100% 100% 100%  Weight:      Height:        Start Time: 1418 hrs. End Time: 1428 hrs. Materials:  Needle(s) Type: Spinal Needle Gauge: 22G Length: 3.5-in Medication(s): Please see orders for medications and dosing details.  Imaging Guidance (Non-Spinal):          Type of Imaging Technique: Fluoroscopy Guidance (Non-Spinal) Indication(s): Assistance in needle guidance and placement for procedures requiring needle placement in or near specific anatomical locations not easily accessible without such assistance. Exposure Time: Please see nurses notes. Contrast: None used. Fluoroscopic Guidance: I was personally present during the use of fluoroscopy. "Tunnel Vision Technique" used to obtain the best possible view of the target area. Parallax error corrected before commencing the procedure. "Direction-depth-direction" technique used to introduce the needle under continuous pulsed fluoroscopy. Once  target was reached, antero-posterior, oblique, and lateral fluoroscopic projection used confirm needle placement in all planes. Images permanently stored in EMR. Interpretation: No contrast injected. I personally interpreted the imaging intraoperatively. Adequate needle placement confirmed in multiple planes. Permanent images saved into the patient's record.  Antibiotic Prophylaxis:   Anti-infectives (From admission, onward)   None     Indication(s): None identified  Post-operative Assessment:  Post-procedure Vital Signs:  Pulse/HCG Rate: 8990 Temp: (!) 97.4 F (36.3 C) Resp: 17 BP: 134/80 SpO2: 100 %  EBL: None  Complications: No immediate post-treatment complications observed by team, or reported by patient.  Note: The patient tolerated the entire procedure well. A repeat set of vitals were taken after the procedure and the patient was kept under observation following institutional policy, for this type of procedure. Post-procedural neurological assessment was performed, showing return to baseline, prior to discharge. The patient was provided with post-procedure discharge instructions, including a section on how to identify potential problems. Should any problems arise concerning this procedure, the patient was given instructions to immediately contact us, at any time, without hesitation. In any case, we plan to contact the patient by telephone for a follow-up status report regarding this interventional procedure.  Comments:  No additional relevant information.  Plan of Care  Orders:  Orders Placed This Encounter  Procedures   GENICULAR NERVE BLOCK    Scheduling Instructions:     Side(s): Bilateral Knee     Level(s): Superior-Lateral, Superior-Medial, and Inferior-Medial Genicular Nerves     Sedation: With Sedation.     Timeframe: Today    Order Specific Question:   Where will this procedure be performed?    Answer:   [facility]   DG PAIN [facility] C-ARM 1-60 MIN NO  REPORT    Intraoperative interpretation by procedural physician at [place].    Standing Status:   Standing    Number of Occurrences:   1    Order Specific Question:   Reason for exam:    Answer:   Assistance in needle guidance and placement  for procedures requiring needle placement in or near specific anatomical locations not easily accessible without such assistance.   Provider attestation of informed consent for procedure/surgical case    I, the ordering provider, attest that I have discussed with the patient the benefits, risks, side effects, alternatives, likelihood of achieving goals and potential problems during recovery for the procedure that I have provided informed consent.    Standing Status:   Standing    Number of Occurrences:   1   Informed Consent Details: Transcribe to consent form and obtain patient signature    Consent Attestation: I, the ordering provider, attest that I have discussed with the patient the benefits, risks, side-effects, alternatives, likelihood of achieving goals, and potential problems during recovery for the procedure that I have provided informed consent.    Standing Status:   Standing    Number of Occurrences:   1    Order Specific Question:   Procedure    Answer:   Bilateral genicular nerves block under fluoroscopic guidance    Order Specific Question:   Surgeon    Answer:   Marquice Uddin A. Dossie Arbour, MD    Order Specific Question:   Indication/Reason    Answer:   Chronic bilateral knee pain secondary to knee arthropathy/arthralgia   Medications ordered for procedure: Meds ordered this encounter  Medications   lidocaine (XYLOCAINE) 2 % (with pres) injection 400 mg   lactated ringers infusion 1,000 mL   midazolam (VERSED) 5 MG/5ML injection 1-2 mg    Make sure Flumazenil is available in the pyxis when using this medication. If oversedation occurs, administer 0.2 mg IV over 15 sec. If after 45 sec no response, administer 0.2 mg again  over 1 min; may repeat at 1 min intervals; not to exceed 4 doses (1 mg)   fentaNYL (SUBLIMAZE) injection 25-50 mcg    Make sure Narcan is available in the pyxis when using this medication. In the event of respiratory depression (RR< 8/min): Titrate NARCAN (naloxone) in increments of 0.1 to 0.2 mg IV at 2-3 minute intervals, until desired degree of reversal.   ropivacaine (PF) 2 mg/mL (0.2%) (NAROPIN) injection 18 mL   methylPREDNISolone acetate (DEPO-MEDROL) injection 80 mg   Medications administered: We administered lidocaine, lactated ringers, midazolam, fentaNYL, ropivacaine (PF) 2 mg/mL (0.2%), and methylPREDNISolone acetate.  See the medical record for exact dosing, route, and time of administration.  Disposition: Discharge home  Discharge Date & Time: 02/19/2019; 1457 hrs.   Follow-up plan:   Return in about 2 weeks (around 03/05/2019) for (Virtual), E/M, (Post-procedrure).     Future Appointments  Date Time Provider [place]  03/06/2019 11:15 AM Milinda Pointer, MD [facility] None  05/13/2019  3:00 PM Lawhorn, Lara Mulch, CNM [facility] None   Primary Care Physician: Hubbard Hartshorn, FNP Location: [facility] Note by: Gaspar Cola, MD Date: 02/19/2019; Time: 3:14 PM  Disclaimer:  Medicine is not an Chief Strategy Officer. The only guarantee in medicine is that nothing is guaranteed. It is important to note that the decision to proceed with this intervention was based on the information collected from the patient. The Data and conclusions were drawn from the patient's questionnaire, the interview, and the physical examination. Because the information was provided in large part by the patient, it cannot be guaranteed that it has not been purposely or unconsciously manipulated. Every effort has been made to obtain as much relevant data as possible for this evaluation. It is important to note that the  conclusions that lead to this procedure are  derived in large part from the available data. Always take into account that the treatment will also be dependent on availability of resources and existing treatment guidelines, considered by other Pain Management Practitioners as being common knowledge and practice, at the time of the intervention. For Medico-Legal purposes, it is also important to point out that variation in procedural techniques and pharmacological choices are the acceptable norm. The indications, contraindications, technique, and results of the above procedure should only be interpreted and judged by a Board-Certified Interventional Pain Specialist with extensive familiarity and expertise in the same exact procedure and technique.

## 2019-02-20 ENCOUNTER — Telehealth: Payer: Self-pay

## 2019-02-20 NOTE — Telephone Encounter (Signed)
Post procedure phone call.  Patient states she is doing well.  

## 2019-02-25 DIAGNOSIS — M7582 Other shoulder lesions, left shoulder: Secondary | ICD-10-CM | POA: Insufficient documentation

## 2019-02-25 DIAGNOSIS — M7581 Other shoulder lesions, right shoulder: Secondary | ICD-10-CM | POA: Insufficient documentation

## 2019-02-26 ENCOUNTER — Encounter: Payer: Medicaid Other | Admitting: Nurse Practitioner

## 2019-02-28 ENCOUNTER — Encounter: Payer: Medicaid Other | Admitting: Nurse Practitioner

## 2019-03-05 ENCOUNTER — Encounter: Payer: Self-pay | Admitting: Pain Medicine

## 2019-03-06 ENCOUNTER — Other Ambulatory Visit: Payer: Self-pay

## 2019-03-06 ENCOUNTER — Ambulatory Visit: Payer: Medicaid Other | Attending: Nurse Practitioner | Admitting: Pain Medicine

## 2019-03-06 DIAGNOSIS — M25561 Pain in right knee: Secondary | ICD-10-CM

## 2019-03-06 DIAGNOSIS — M19012 Primary osteoarthritis, left shoulder: Secondary | ICD-10-CM

## 2019-03-06 DIAGNOSIS — M150 Primary generalized (osteo)arthritis: Secondary | ICD-10-CM | POA: Insufficient documentation

## 2019-03-06 DIAGNOSIS — M25532 Pain in left wrist: Secondary | ICD-10-CM

## 2019-03-06 DIAGNOSIS — G894 Chronic pain syndrome: Secondary | ICD-10-CM

## 2019-03-06 DIAGNOSIS — M792 Neuralgia and neuritis, unspecified: Secondary | ICD-10-CM

## 2019-03-06 DIAGNOSIS — M25512 Pain in left shoulder: Secondary | ICD-10-CM | POA: Diagnosis not present

## 2019-03-06 DIAGNOSIS — M25562 Pain in left knee: Secondary | ICD-10-CM

## 2019-03-06 DIAGNOSIS — M797 Fibromyalgia: Secondary | ICD-10-CM

## 2019-03-06 DIAGNOSIS — M545 Low back pain: Secondary | ICD-10-CM

## 2019-03-06 DIAGNOSIS — M25511 Pain in right shoulder: Secondary | ICD-10-CM

## 2019-03-06 DIAGNOSIS — G8929 Other chronic pain: Secondary | ICD-10-CM

## 2019-03-06 DIAGNOSIS — M47816 Spondylosis without myelopathy or radiculopathy, lumbar region: Secondary | ICD-10-CM | POA: Insufficient documentation

## 2019-03-06 DIAGNOSIS — M159 Polyosteoarthritis, unspecified: Secondary | ICD-10-CM | POA: Insufficient documentation

## 2019-03-06 DIAGNOSIS — M19011 Primary osteoarthritis, right shoulder: Secondary | ICD-10-CM

## 2019-03-06 MED ORDER — PREGABALIN 50 MG PO CAPS: ORAL_CAPSULE | ORAL | 0 refills | Status: DC

## 2019-03-06 MED ORDER — HYDROCODONE-ACETAMINOPHEN 5-325 MG PO TABS: 1.0000 | ORAL_TABLET | Freq: Three times a day (TID) | ORAL | 0 refills | Status: DC | PRN

## 2019-03-06 MED ORDER — IBUPROFEN 800 MG PO TABS: 800.0000 mg | ORAL_TABLET | Freq: Three times a day (TID) | ORAL | 2 refills | Status: DC | PRN

## 2019-03-06 NOTE — Progress Notes (Signed)
Pain Management Virtual Encounter Note - Virtual Visit via Telephone Telehealth (real-time audio visits between healthcare provider and patient).   Patient's Phone No. & Preferred Pharmacy:  [phone] (home); [phone] (mobile); (Preferred) [phone] [email or web address]  [place] ([place]), [place] [facility] [address]) [place] [address] Phone: [phone] Fax: [phone]    Pre-screening note:  Our staff contacted Lauren [name] and offered her an "in person", "face-to-face" appointment versus a telephone encounter. She indicated preferring the telephone encounter, at this time.   Reason for Virtual Visit: COVID-19*  Social distancing based on CDC and AMA recommendations.   I contacted Lauren [name] on 03/06/2019 via telephone.      I clearly identified myself as Gaspar Cola, MD. I verified that I was speaking with the correct person using two identifiers (Name: Lauren [name], and date of birth: 26-Mar-1976).  Advanced Informed Consent I sought verbal advanced consent from Lauren [name] for virtual visit interactions. I informed Lauren [name] of possible security and privacy concerns, risks, and limitations associated with providing "not-in-person" medical evaluation and management services. I also informed Lauren [name] of the availability of "in-person" appointments. Finally, I informed her that there would be a charge for the virtual visit and that she could be  personally, fully or partially, financially responsible for it. Lauren [name] expressed understanding and agreed to proceed.   Historic Elements   Lauren [name] is a 43 y.o. year old, female patient evaluated today after her last encounter by our practice on 02/20/2019. Lauren [name]  has a past medical history of Advanced maternal age in multigravida, Asthma, Depression, GERD (gastroesophageal reflux disease), Grand multiparity, Headache,  History of carpal tunnel surgery of left wrist (05/2018), Hypertension, Leg fracture, Obesity, morbid, BMI 40.0-49.9 ([facility]), Post traumatic stress disorder, Pre-diabetes, Pre-eclampsia, SVT (supraventricular tachycardia) ([facility]), and Wrist fracture, closed. She also  has a past surgical history that includes Rotator cuff repair; Knee surgery; Cholecystectomy; Cesarean section (2010); Cesarean section (2011); Tonsillectomy; Cardiac electrophysiology study and ablation (01/18/2018); and Excision mass neck (Left, 10/11/2018). Lauren [name] has a current medication list which includes the following prescription(s): hydrocodone-acetaminophen, hydrocodone-acetaminophen, hydrocodone-acetaminophen, ibuprofen, pregabalin, albuterol, aspirin ec, beclomethasone, esomeprazole, fluticasone, furosemide, losartan-hydrochlorothiazide, medroxyprogesterone acetate, metoprolol succinate, and stiolto respimat. She  reports that she has never smoked. She has never used smokeless tobacco. She reports that she does not drink alcohol or use drugs. Lauren [name] is allergic to nucynta [tapentadol] and tape.   HPI  Today, she is being contacted for both, medication management and a post-procedure assessment.  The patient indicates that she has been having more pain in the shoulders, bilaterally with the left being worse than the right.  In addition, because of the limping secondary to the knee, she has been experiencing more low back pain, bilaterally, with the right side being worse than the left.  I have given the patient some choices as to what she would like to have treated She has requested to have the left shoulder and the right side of her back included in the first treatment.  We will probably be able to do both sides of the lower back and on the left shoulder.  In addition, the patient has indicated beginning to experience more wrist pain and would like to later on address the issue of her carpal tunnel syndrome.  Pharmacotherapy  Assessment  Analgesic: Hydrocodone/APAP 5/325 1 tablet p.o. every 8 hours (15 mg/day of hydrocodone) MME/day: 15 mg/day.  Monitoring: Pharmacotherapy: No side-effects or adverse reactions reported. [place] PMP: PDMP reviewed during this encounter.       Compliance: No problems identified. Effectiveness: Clinically acceptable. Plan: Refer to "POC".  Post-Procedure Evaluation  Procedure: Diagnostic bilateral genicular nerve block #1 under fluoroscopic guidance and IV sedation Pre-procedure pain level:  3/10 Post-procedure: 0/10 (100% relief)  Sedation: Sedation provided.  Effectiveness during initial hour after procedure(Ultra-Short Term Relief): 100 %   Local anesthetic used: Long-acting (4-6 hours) Effectiveness: Defined as any analgesic benefit obtained secondary to the administration of local anesthetics. This carries significant diagnostic value as to the etiological location, or anatomical origin, of the pain. Duration of benefit is expected to coincide with the duration of the local anesthetic used.  Effectiveness during initial 4-6 hours after procedure(Short-Term Relief): (right 100%, left 20)   Long-term benefit: Defined as any relief past the pharmacologic duration of the local anesthetics.  Effectiveness past the initial 6 hours after procedure(Long-Term Relief): (right 90,%  left  25%)   Current benefits: Defined as benefit that persist at this time.   Analgesia:  100% (Right); 75% (Left) Function: Lauren [name] reports improvement in function ROM: Lauren [name] reports improvement in ROM  Pertinent Labs   SAFETY SCREENING Profile Lab Results  Component Value Date   SARSCOV2NAA NOT DETECTED 02/15/2019   COVIDSOURCE NASOPHARYNGEAL 02/15/2019   PREGTESTUR NEGATIVE 10/11/2018   Renal Function Lab Results  Component Value Date   BUN 9 08/02/2018   CREATININE 0.78 62/83/1517   BCR NOT APPLICABLE 61/60/7371   GFRAA 110 08/02/2018   GFRNONAA 95 08/02/2018   Hepatic  Function Lab Results  Component Value Date   AST 21 07/12/2018   ALT 25 07/12/2018   ALBUMIN 3.9 06/25/2018   UDS Summary  Date Value Ref Range Status  09/13/2018 FINAL  Final    Comment:    ==================================================================== TOXASSURE SELECT 13 (MW) ==================================================================== Test                             Result       Flag       Units Drug Present and Declared for Prescription Verification   Hydrocodone                    324          EXPECTED   ng/mg creat   Hydromorphone                  78           EXPECTED   ng/mg creat   Dihydrocodeine                 176          EXPECTED   ng/mg creat   Norhydrocodone                 493          EXPECTED   ng/mg creat    Sources of hydrocodone include scheduled prescription    medications. Hydromorphone, dihydrocodeine and norhydrocodone are    expected metabolites of hydrocodone. Hydromorphone and    dihydrocodeine are also available as scheduled prescription    medications. ==================================================================== Test                      Result    Flag   Units      Ref Range   Creatinine  190              mg/dL      >=20 ==================================================================== Declared Medications:  The flagging and interpretation on this report are based on the  following declared medications.  Unexpected results may arise from  inaccuracies in the declared medications.  **Note: The testing scope of this panel includes these medications:  Hydrocodone (Norco)  Hydrocodone (Vicodin)  **Note: The testing scope of this panel does not include following  reported medications:  Acetaminophen (Norco)  Acetaminophen (Tylenol)  Acetaminophen (Vicodin)  Albuterol  Aspirin (Aspirin 81)  Beclomethasone (QVAR)  Diltiazem (Cardizem)  Fluticasone (Flovent)  Furosemide (Lasix)  Gabapentin   Hydrochlorothiazide (Hyzaar)  Ibuprofen (Advil)  Losartan (Hyzaar)  Metformin  Metoprolol (Toprol)  Olodaterol (Stiolto)  Omeprazole (Nexium)  Tiotropium (Stiolto)  Tizanidine (Zanaflex) ==================================================================== For clinical consultation, please call [phone]. ====================================================================    Note: Above Lab results reviewed.  Recent imaging  DG C-Arm 1-60 Min-No Report Fluoroscopy was utilized by the requesting physician.  No radiographic  interpretation.   Assessment  The primary encounter diagnosis was Chronic pain syndrome. Diagnoses of Chronic knee pain (Primary Area of Pain) (Bilateral) (L>R), Chronic wrist pain (Secondary Area of Pain) (Left), Acute pain of left shoulder, Chronic shoulder pain (Fourth Area of Pain) (Bilateral) (L>R), Osteoarthritis of shoulder (Bilateral), Chronic low back pain (Fifth Area of Pain) (Bilateral) (R>L) w/o sciatica, Lumbar facet syndrome (Bilateral) (R>L), Osteoarthritis involving multiple joints, Neurogenic pain, and Fibromyalgia were also pertinent to this visit.  Plan of Care  I have discontinued Lauren [name]'s HYDROcodone-acetaminophen, HYDROcodone-acetaminophen, HYDROcodone-acetaminophen, etodolac, gabapentin, and HYDROcodone-acetaminophen. I have also changed her HYDROcodone-acetaminophen and ibuprofen. Additionally, I am having her start on pregabalin, HYDROcodone-acetaminophen, and HYDROcodone-acetaminophen. Lastly, I am having her maintain her aspirin EC, furosemide, metoprolol succinate, losartan-hydrochlorothiazide, Stiolto Respimat, esomeprazole, albuterol, beclomethasone, fluticasone, and medroxyPROGESTERone Acetate.  Pharmacotherapy (Medications Ordered): Meds ordered this encounter  Medications  . HYDROcodone-acetaminophen (NORCO/VICODIN) 5-325 MG tablet    Sig: Take 1 tablet by mouth every 8 (eight) hours as needed for up to 30 days for  severe pain. Must last 30 days    Dispense:  90 tablet    Refill:  0    Chronic Pain: STOP Act (Not applicable) Fill 1 day early if closed on refill date. Do not fill until: 03/13/2019. To last until: 04/12/2019. Avoid benzodiazepines within 8 hours of opioids  . ibuprofen (ADVIL) 800 MG tablet    Sig: Take 1 tablet (800 mg total) by mouth every 8 (eight) hours as needed for moderate pain.    Dispense:  90 tablet    Refill:  2    Fill one day early if pharmacy is closed on scheduled refill date. May substitute for generic if available.  . pregabalin (LYRICA) 50 MG capsule    Sig: Take 1 cap (50 mg) PO BID x 7 days, then 1 cap PO TID x 7 days, then take 2 caps (100 mg) PO BID and stay on this dose.    Dispense:  99 capsule    Refill:  0    Fill one day early if pharmacy is closed on scheduled refill date. May substitute for generic if available.  Marland Kitchen HYDROcodone-acetaminophen (NORCO/VICODIN) 5-325 MG tablet    Sig: Take 1 tablet by mouth every 8 (eight) hours as needed for up to 30 days for severe pain. Must last 30 days    Dispense:  90 tablet    Refill:  0    Chronic Pain: STOP  Act (Not applicable) Fill 1 day early if closed on refill date. Do not fill until: 04/12/2019. To last until: 05/12/2019. Avoid benzodiazepines within 8 hours of opioids  . HYDROcodone-acetaminophen (NORCO/VICODIN) 5-325 MG tablet    Sig: Take 1 tablet by mouth every 8 (eight) hours as needed for up to 30 days for severe pain. Must last 30 days    Dispense:  90 tablet    Refill:  0    Chronic Pain: STOP Act (Not applicable) Fill 1 day early if closed on refill date. Do not fill until: 05/12/2019. To last until: 06/11/2019. Avoid benzodiazepines within 8 hours of opioids   Orders:  Orders Placed This Encounter  Procedures  . SHOULDER INJECTION    Standing Status:   Future    Standing Expiration Date:   04/05/2019    Scheduling Instructions:     Side: Left-sided     Sedation: With Sedation.     Timeframe: As soon as  schedule allows    Order Specific Question:   Where will this procedure be performed?    Answer:   [facility]    Comments:   by Dr. Dossie Arbour  . LUMBAR FACET(MEDIAL BRANCH NERVE BLOCK) MBNB    Standing Status:   Future    Standing Expiration Date:   04/05/2019    Scheduling Instructions:     Side: Bilateral     Level: L3-4, L4-5, & L5-S1 Facets (L2, L3, L4, L5, & S1 Medial Branch Nerves)     Sedation: Patient's choice.     Timeframe: ASAA    Order Specific Question:   Where will this procedure be performed?    Answer:   [facility]   Follow-up plan:   Return in about 3 months (around 06/03/2019) for (VV), E/M (MM), in addition, Procedure (w/ sedation): (R) L-FCT BLK #1 + (L) Shoulder inj., (ASAP).    Recent Visits Date Type Provider Dept  02/19/19 Procedure visit Milinda Pointer, MD [facility]  12/26/18 Office Visit Milinda Pointer, MD [facility]  12/18/18 Procedure visit Milinda Pointer, MD [facility]  12/10/18 Telemedicine Vevelyn Francois, NP [facility]  Showing recent visits within past 90 days and meeting all other requirements   Today's Visits Date Type Provider Dept  03/06/19 Office Visit Milinda Pointer, MD [facility]  Showing today's visits and meeting all other requirements   Future Appointments No visits were found meeting these conditions.  Showing future appointments within next 90 days and meeting all other requirements   I discussed the assessment and treatment plan with the patient. The patient was provided an opportunity to ask questions and all were answered. The patient agreed with the plan and demonstrated an understanding of the instructions.  Patient advised to call back or seek an in-person evaluation if the symptoms or condition worsens.  Total duration of non-face-to-face encounter: 25 minutes.  Note by: Gaspar Cola, MD Date: 03/06/2019; Time: 12:52 PM  Note:  This dictation was prepared with Dragon dictation. Any transcriptional errors that may result from this process are unintentional.  Disclaimer:  * Given the special circumstances of the COVID-19 pandemic, the federal government has announced that the Office for Civil Rights (OCR) will exercise its enforcement discretion and will not impose penalties on physicians using telehealth in the event of noncompliance with regulatory requirements under the [place] (HIPAA) in connection with the good faith provision of telehealth during the COVID-19 national  public health emergency. ([place])

## 2019-03-06 NOTE — Addendum Note (Signed)
Addended by: Dewayne Shorter on: 03/06/2019 02:12 PM   Modules accepted: Miquel Dunn

## 2019-03-06 NOTE — Patient Instructions (Signed)

## 2019-03-11 DIAGNOSIS — Z6841 Body Mass Index (BMI) 40.0 and over, adult: Secondary | ICD-10-CM | POA: Insufficient documentation

## 2019-03-15 ENCOUNTER — Other Ambulatory Visit: Payer: Self-pay

## 2019-03-15 ENCOUNTER — Other Ambulatory Visit
Admission: RE | Admit: 2019-03-15 | Discharge: 2019-03-15 | Disposition: A | Discharge status: Home / Self Care | Payer: Medicaid Other | Source: Ambulatory Visit | Attending: Pain Medicine | Admitting: Pain Medicine

## 2019-03-15 DIAGNOSIS — Z01812 Encounter for preprocedural laboratory examination: Secondary | ICD-10-CM | POA: Insufficient documentation

## 2019-03-15 DIAGNOSIS — Z1159 Encounter for screening for other viral diseases: Secondary | ICD-10-CM | POA: Diagnosis not present

## 2019-03-16 LAB — SARS CORONAVIRUS 2 (TAT 6-24 HRS): SARS Coronavirus 2: NEGATIVE

## 2019-03-19 ENCOUNTER — Encounter: Payer: Self-pay | Admitting: Pain Medicine

## 2019-03-19 ENCOUNTER — Ambulatory Visit
Admission: RE | Admit: 2019-03-19 | Discharge: 2019-03-19 | Disposition: A | Discharge status: Home / Self Care | Payer: Medicaid Other | Source: Ambulatory Visit | Attending: Pain Medicine | Admitting: Pain Medicine

## 2019-03-19 ENCOUNTER — Other Ambulatory Visit: Payer: Self-pay

## 2019-03-19 ENCOUNTER — Ambulatory Visit (HOSPITAL_BASED_OUTPATIENT_CLINIC_OR_DEPARTMENT_OTHER): Payer: Medicaid Other | Admitting: Pain Medicine

## 2019-03-19 VITALS — BP 127/54 | HR 83 | Temp 99.5°F | Resp 22 | Ht 64.0 in | Wt 340.0 lb

## 2019-03-19 DIAGNOSIS — M47817 Spondylosis without myelopathy or radiculopathy, lumbosacral region: Secondary | ICD-10-CM | POA: Diagnosis not present

## 2019-03-19 DIAGNOSIS — M5136 Other intervertebral disc degeneration, lumbar region: Secondary | ICD-10-CM

## 2019-03-19 DIAGNOSIS — M545 Low back pain, unspecified: Secondary | ICD-10-CM

## 2019-03-19 DIAGNOSIS — M25561 Pain in right knee: Secondary | ICD-10-CM | POA: Insufficient documentation

## 2019-03-19 DIAGNOSIS — M170 Bilateral primary osteoarthritis of knee: Secondary | ICD-10-CM

## 2019-03-19 DIAGNOSIS — M47816 Spondylosis without myelopathy or radiculopathy, lumbar region: Secondary | ICD-10-CM | POA: Diagnosis not present

## 2019-03-19 DIAGNOSIS — M19011 Primary osteoarthritis, right shoulder: Secondary | ICD-10-CM

## 2019-03-19 DIAGNOSIS — M25512 Pain in left shoulder: Secondary | ICD-10-CM | POA: Insufficient documentation

## 2019-03-19 DIAGNOSIS — M19012 Primary osteoarthritis, left shoulder: Secondary | ICD-10-CM

## 2019-03-19 DIAGNOSIS — M25562 Pain in left knee: Secondary | ICD-10-CM | POA: Insufficient documentation

## 2019-03-19 DIAGNOSIS — M7582 Other shoulder lesions, left shoulder: Secondary | ICD-10-CM | POA: Insufficient documentation

## 2019-03-19 DIAGNOSIS — G8929 Other chronic pain: Secondary | ICD-10-CM

## 2019-03-19 MED ORDER — LACTATED RINGERS IV SOLN
1000.0000 mL | Freq: Once | INTRAVENOUS | Status: AC
  Administered 2019-03-19: 15:00:00 1000 mL via INTRAVENOUS

## 2019-03-19 MED ORDER — ROPIVACAINE HCL 2 MG/ML IJ SOLN
9.0000 mL | Freq: Once | INTRAMUSCULAR | Status: AC
  Administered 2019-03-19: 9 mL via PERINEURAL
  Filled 2019-03-19: qty 10

## 2019-03-19 MED ORDER — LIDOCAINE HCL 2 % IJ SOLN
20.0000 mL | Freq: Once | INTRAMUSCULAR | Status: AC
  Administered 2019-03-19: 400 mg
  Filled 2019-03-19: qty 20

## 2019-03-19 MED ORDER — ROPIVACAINE HCL 2 MG/ML IJ SOLN
9.0000 mL | Freq: Once | INTRAMUSCULAR | Status: AC
  Administered 2019-03-19: 9 mL via INTRA_ARTICULAR
  Filled 2019-03-19: qty 10

## 2019-03-19 MED ORDER — TRIAMCINOLONE ACETONIDE 40 MG/ML IJ SUSP
40.0000 mg | Freq: Once | INTRAMUSCULAR | Status: AC
  Administered 2019-03-19: 40 mg
  Filled 2019-03-19: qty 1

## 2019-03-19 MED ORDER — MIDAZOLAM HCL 5 MG/5ML IJ SOLN
1.0000 mg | INTRAMUSCULAR | Status: DC | PRN
  Administered 2019-03-19: 14:00:00 2 mg via INTRAVENOUS
  Filled 2019-03-19: qty 5

## 2019-03-19 MED ORDER — METHYLPREDNISOLONE ACETATE 80 MG/ML IJ SUSP
80.0000 mg | Freq: Once | INTRAMUSCULAR | Status: AC
  Administered 2019-03-19: 80 mg via INTRA_ARTICULAR
  Filled 2019-03-19: qty 1

## 2019-03-19 MED ORDER — FENTANYL CITRATE (PF) 100 MCG/2ML IJ SOLN
25.0000 ug | INTRAMUSCULAR | Status: DC | PRN
  Administered 2019-03-19: 50 ug via INTRAVENOUS
  Filled 2019-03-19: qty 2

## 2019-03-19 NOTE — Patient Instructions (Addendum)
____________________________________________________________________________________________  Post-Procedure Discharge Instructions  Instructions:  Apply ice:   Purpose: This will minimize any swelling and discomfort after procedure.   When: Day of procedure, as soon as you get home.  How: Fill a plastic sandwich bag with crushed ice. Cover it with a small towel and apply to injection site.  How long: (15 min on, 15 min off) Apply for 15 minutes then remove x 15 minutes.  Repeat sequence on day of procedure, until you go to bed.  Apply heat:   Purpose: To treat any soreness and discomfort from the procedure.  When: Starting the next day after the procedure.  How: Apply heat to procedure site starting the day following the procedure.  How long: May continue to repeat daily, until discomfort goes away.  Food intake: Start with clear liquids (like water) and advance to regular food, as tolerated.   Physical activities: Keep activities to a minimum for the first 8 hours after the procedure. After that, then as tolerated.  Driving: If you have received any sedation, be responsible and do not drive. You are not allowed to drive for 24 hours after having sedation.  Blood thinner: (Applies only to those taking blood thinners) You may restart your blood thinner 6 hours after your procedure.  Insulin: (Applies only to Diabetic patients taking insulin) As soon as you can eat, you may resume your normal dosing schedule.  Infection prevention: Keep procedure site clean and dry. Shower daily and clean area with soap and water.  Post-procedure Pain Diary: Extremely important that this be done correctly and accurately. Recorded information will be used to determine the next step in treatment. For the purpose of accuracy, follow these rules:  Evaluate only the area treated. Do not report or include pain from an untreated area. For the purpose of this evaluation, ignore all other areas of pain,  except for the treated area.  After your procedure, avoid taking a long nap and attempting to complete the pain diary after you wake up. Instead, set your alarm clock to go off every hour, on the hour, for the initial 8 hours after the procedure. Document the duration of the numbing medicine, and the relief you are getting from it.  Do not go to sleep and attempt to complete it later. It will not be accurate. If you received sedation, it is likely that you were given a medication that may cause amnesia. Because of this, completing the diary at a later time may cause the information to be inaccurate. This information is needed to plan your care.  Follow-up appointment: Keep your post-procedure follow-up evaluation appointment after the procedure (usually 2 weeks for most procedures, 6 weeks for radiofrequencies). DO NOT FORGET to bring you pain diary with you.   Expect: (What should I expect to see with my procedure?)  From numbing medicine (AKA: Local Anesthetics): Numbness or decrease in pain. You may also experience some weakness, which if present, could last for the duration of the local anesthetic.  Onset: Full effect within 15 minutes of injected.  Duration: It will depend on the type of local anesthetic used. On the average, 1 to 8 hours.   From steroids (Applies only if steroids were used): Decrease in swelling or inflammation. Once inflammation is improved, relief of the pain will follow.  Onset of benefits: Depends on the amount of swelling present. The more swelling, the longer it will take for the benefits to be seen. In some cases, up to 10 days.    Duration: Steroids will stay in the system x 2 weeks. Duration of benefits will depend on multiple posibilities including persistent irritating factors.  Side-effects: If present, they may typically last 2 weeks (the duration of the steroids).  Frequent: Cramps (if they occur, drink Gatorade and take over-the-counter Magnesium 450-500 mg  once to twice a day); water retention with temporary weight gain; increases in blood sugar; decreased immune system response; increased appetite.  Occasional: Facial flushing (red, warm cheeks); mood swings; menstrual changes.  Uncommon: Long-term decrease or suppression of natural hormones; bone thinning. (These are more common with higher doses or more frequent use. This is why we prefer that our patients avoid having any injection therapies in other practices.)   Very Rare: Severe mood changes; psychosis; aseptic necrosis.  From procedure: Some discomfort is to be expected once the numbing medicine wears off. This should be minimal if ice and heat are applied as instructed.  Call if: (When should I call?)  You experience numbness and weakness that gets worse with time, as opposed to wearing off.  New onset bowel or bladder incontinence. (Applies only to procedures done in the spine)  Emergency Numbers:  Durning business hours (Monday - Thursday, 8:00 AM - 4:00 PM) (Friday, 9:00 AM - 12:00 Noon): [phone]  After hours: [phone]  NOTE: If you are having a problem and are unable connect with, or to talk to a provider, then go to your nearest urgent care or emergency department. If the problem is serious and urgent, please call 911. ____________________________________________________________________________________________   ____________________________________________________________________________________________  Preparing for your procedure (without sedation)  Procedure appointments are limited to planned procedures: . No Prescription Refills. . No disability issues will be discussed. . No medication changes will be discussed.  Instructions: . Oral Intake: Do not eat or drink anything for at least 3 hours prior to your procedure. . Transportation: Unless otherwise stated by your physician, you may drive yourself after the procedure. . Blood Pressure Medicine:  Take your blood pressure medicine with a sip of water the morning of the procedure. . Blood thinners: Notify our staff if you are taking any blood thinners. Depending on which one you take, there will be specific instructions on how and when to stop it. . Diabetics on insulin: Notify the staff so that you can be scheduled 1st case in the morning. If your diabetes requires high dose insulin, take only  of your normal insulin dose the morning of the procedure and notify the staff that you have done so. . Preventing infections: Shower with an antibacterial soap the morning of your procedure.  . Build-up your immune system: Take 1000 mg of Vitamin C with every meal (3 times a day) the day prior to your procedure. . Antibiotics: Inform the staff if you have a condition or reason that requires you to take antibiotics before dental procedures. . Pregnancy: If you are pregnant, call and cancel the procedure. . Sickness: If you have a cold, fever, or any active infections, call and cancel the procedure. . Arrival: You must be in the facility at least 30 minutes prior to your scheduled procedure. . Children: Do not bring any children with you. . Dress appropriately: Bring dark clothing that you would not mind if they get stained. . Valuables: Do not bring any jewelry or valuables.  Reasons to call and reschedule or cancel your procedure: (Following these recommendations will minimize the risk of a serious complication.) . Surgeries: Avoid having procedures within 2 weeks   of any surgery. (Avoid for 2 weeks before or after any surgery). . Flu Shots: Avoid having procedures within 2 weeks of a flu shots or . (Avoid for 2 weeks before or after immunizations). . Barium: Avoid having a procedure within 7-10 days after having had a radiological study involving the use of radiological contrast. (Myelograms, Barium swallow or enema study). . Heart attacks: Avoid any elective procedures or surgeries for the initial 6  months after a "Myocardial Infarction" (Heart Attack). . Blood thinners: It is imperative that you stop these medications before procedures. Let us know if you if you take any blood thinner.  . Infection: Avoid procedures during or within two weeks of an infection (including chest colds or gastrointestinal problems). Symptoms associated with infections include: Localized redness, fever, chills, night sweats or profuse sweating, burning sensation when voiding, cough, congestion, stuffiness, runny nose, sore throat, diarrhea, nausea, vomiting, cold or Flu symptoms, recent or current infections. It is specially important if the infection is over the area that we intend to treat. . Heart and lung problems: Symptoms that may suggest an active cardiopulmonary problem include: cough, chest pain, breathing difficulties or shortness of breath, dizziness, ankle swelling, uncontrolled high or unusually low blood pressure, and/or palpitations. If you are experiencing any of these symptoms, cancel your procedure and contact your primary care physician for an evaluation.  Remember:  Regular Business hours are:  Monday to Thursday 8:00 AM to 4:00 PM  Provider's Schedule: Jakub Debold, MD:  Procedure days: Tuesday and Thursday 7:30 AM to 4:00 PM  Bilal Lateef, MD:  Procedure days: Monday and Wednesday 7:30 AM to 4:00 PM ____________________________________________________________________________________________     

## 2019-03-19 NOTE — Progress Notes (Signed)
Safety precautions to be maintained throughout the outpatient stay will include: orient to surroundings, keep bed in low position, maintain call bell within reach at all times, provide assistance with transfer out of bed and ambulation.  

## 2019-03-19 NOTE — Progress Notes (Signed)
Patient's Name: ABBEE [name]  MRN: [id number]  Referring Provider: Hubbard Hartshorn, FNP  DOB: 31-May-1976  PCP: Hubbard Hartshorn, FNP  DOS: 03/19/2019  Note by: Gaspar Cola, MD  Service setting: Ambulatory outpatient  Specialty: Interventional Pain Management  Patient type: Established  Location: [facility] (AMB) [facility] Facility  Visit type: Interventional Procedure   Primary Reason for Visit: Interventional Pain Management Treatment. CC: Back Pain (low) and Shoulder Pain (left)  Procedure #1:  Anesthesia, Analgesia, Anxiolysis:  Type: Diagnostic Glenohumeral Joint (shoulder) Injection #2  Primary Purpose: Diagnostic Region: Superior Shoulder Area Level:  Shoulder Target Area: Glenohumeral Joint (shoulder) Approach: Anterior approach. Laterality: Left-Sided  Type: Moderate (Conscious) Sedation combined with Local Anesthesia Indication(s): Analgesia and Anxiety Route: Intravenous (IV) IV Access: Secured Sedation: Meaningful verbal contact was maintained at all times during the procedure  Local Anesthetic: Lidocaine 1-2%  Position: Supine   Indications: 1. Chronic shoulder pain (Left)   2. Osteoarthritis of shoulder (Bilateral)   3. Rotator cuff tendinitis, left    Procedure #2:  Anesthesia, Analgesia, Anxiolysis:  Type: Lumbar Facet, Medial Branch Block(s) #1  Primary Purpose: Diagnostic Region: Posterolateral Lumbosacral Spine Level: L2, L3, L4, L5, & S1 Medial Branch Level(s). Injecting these levels blocks the L3-4, L4-5, and L5-S1 lumbar facet joints. Laterality: Right  See above.  Position: Prone   Indications: 1. Lumbar facet syndrome (Bilateral) (R>L)   2. Spondylosis without myelopathy or radiculopathy, lumbosacral region   3. DDD (degenerative disc disease), lumbar   4. Chronic low back pain (Fifth Area of Pain) (Bilateral) (R>L) w/o sciatica    Pain Score: Pre-procedure: 3 /10 Post-procedure: 0-No pain/10  Pre-op Assessment:  Lauren [name] is a 43 y.o.  (year old), female patient, seen today for interventional treatment. She  has a past surgical history that includes Rotator cuff repair; Knee surgery; Cholecystectomy; Cesarean section (2010); Cesarean section (2011); Tonsillectomy; Cardiac electrophysiology study and ablation (01/18/2018); and Excision mass neck (Left, 10/11/2018). Lauren [name] has a current medication list which includes the following prescription(s): albuterol, aspirin ec, beclomethasone, esomeprazole, fluticasone, furosemide, hydrocodone-acetaminophen, hydrocodone-acetaminophen, hydrocodone-acetaminophen, ibuprofen, losartan-hydrochlorothiazide, medroxyprogesterone acetate, metoprolol succinate, pregabalin, and stiolto respimat, and the following Facility-Administered Medications: fentanyl and midazolam. Her primarily concern today is the Back Pain (low) and Shoulder Pain (left)  Initial Vital Signs:  Pulse/HCG Rate: 94  Temp: 98.9 F (37.2 C) Resp: 18 BP: 137/88 SpO2: 100 %  BMI: Estimated body mass index is 58.36 kg/m as calculated from the following:   Height as of this encounter: 5\' 4"  (1.626 m).   Weight as of this encounter: 340 lb (154.2 kg).  Risk Assessment: Allergies: Reviewed. She is allergic to nucynta [tapentadol] and tape.  Allergy Precautions: None required Coagulopathies: Reviewed. None identified.  Blood-thinner therapy: None at this time Active Infection(s): Reviewed. None identified. Lauren [name] is afebrile  Site Confirmation: Lauren [name] was asked to confirm the procedure and laterality before marking the site Procedure checklist: Completed Consent: Before the procedure and under the influence of no sedative(s), amnesic(s), or anxiolytics, the patient was informed of the treatment options, risks and possible complications. To fulfill our ethical and legal obligations, as recommended by the American Medical Association's Code of Ethics, I have informed the patient of my clinical impression; the  nature and purpose of the treatment or procedure; the risks, benefits, and possible complications of the intervention; the alternatives, including doing nothing; the risk(s) and benefit(s) of the alternative treatment(s) or procedure(s); and the risk(s) and benefit(s) of doing nothing. The  patient was provided information about the general risks and possible complications associated with the procedure. These may include, but are not limited to: failure to achieve desired goals, infection, bleeding, organ or nerve damage, allergic reactions, paralysis, and death. In addition, the patient was informed of those risks and complications associated to the procedure, such as failure to decrease pain; infection; bleeding; organ or nerve damage with subsequent damage to sensory, motor, and/or autonomic systems, resulting in permanent pain, numbness, and/or weakness of one or several areas of the body; allergic reactions; (i.e.: anaphylactic reaction); and/or death. Furthermore, the patient was informed of those risks and complications associated with the medications. These include, but are not limited to: allergic reactions (i.e.: anaphylactic or anaphylactoid reaction(s)); adrenal axis suppression; blood sugar elevation that in diabetics may result in ketoacidosis or comma; water retention that in patients with history of congestive heart failure may result in shortness of breath, pulmonary edema, and decompensation with resultant heart failure; weight gain; swelling or edema; medication-induced neural toxicity; particulate matter embolism and blood vessel occlusion with resultant organ, and/or nervous system infarction; and/or aseptic necrosis of one or more joints. Finally, the patient was informed that Medicine is not an exact science; therefore, there is also the possibility of unforeseen or unpredictable risks and/or possible complications that may result in a catastrophic outcome. The patient indicated having  understood very clearly. We have given the patient no guarantees and we have made no promises. Enough time was given to the patient to ask questions, all of which were answered to the patient's satisfaction. Lauren [name] has indicated that she wanted to continue with the procedure. Attestation: I, the ordering provider, attest that I have discussed with the patient the benefits, risks, side-effects, alternatives, likelihood of achieving goals, and potential problems during recovery for the procedure that I have provided informed consent. Date   Time: 03/19/2019  1:53 PM  Pre-Procedure Preparation:  Monitoring: As per clinic protocol. Respiration, ETCO2, SpO2, BP, heart rate and rhythm monitor placed and checked for adequate function Safety Precautions: Patient was assessed for positional comfort and pressure points before starting the procedure. Time-out: I initiated and conducted the "Time-out" before starting the procedure, as per protocol. The patient was asked to participate by confirming the accuracy of the "Time Out" information. Verification of the correct person, site, and procedure were performed and confirmed by me, the nursing staff, and the patient. "Time-out" conducted as per Joint Commission's Universal Protocol (UP.01.01.01). Time: 1478  Description of Procedure #1:  Area Prepped: Entire shoulder Area Prepping solution: DuraPrep (Iodine Povacrylex [0.7% available iodine] and Isopropyl Alcohol, 74% w/w) Safety Precautions: Aspiration looking for blood return was conducted prior to all injections. At no point did we inject any substances, as a needle was being advanced. No attempts were made at seeking any paresthesias. Safe injection practices and needle disposal techniques used. Medications properly checked for expiration dates. SDV (single dose vial) medications used. Description of the Procedure: Protocol guidelines were followed. The patient was placed in position over the procedure  table. The target area was identified and the area prepped in the usual manner. Skin & deeper tissues infiltrated with local anesthetic. Appropriate amount of time allowed to pass for local anesthetics to take effect. The procedure needles were then advanced to the target area. Proper needle placement secured. Negative aspiration confirmed. Solution injected in intermittent fashion, asking for systemic symptoms every 0.5cc of injectate. The needles were then removed and the area cleansed, making sure to leave some of  the prepping solution back to take advantage of its long term bactericidal properties.    Vitals:   03/19/19 1503 03/19/19 1513 03/19/19 1523 03/19/19 1533  BP: 130/74 123/65 118/73 (!) 127/54  Pulse: 96 100 94 83  Resp: 18 20 (!) 22 (!) 22  Temp:  98.9 F (37.2 C)  99.5 F (37.5 C)  SpO2: 97% 100% 98% 100%  Weight:      Height:        Start Time: 1454 hrs.  Materials:  Needle(s) Type: Spinal Needle Gauge: 22G Length: 3.5-in Medication(s): Please see orders for medications and dosing details.  Imaging Guidance (Non-Spinal) for procedure #1:  Type of Imaging Technique: Fluoroscopy Guidance (Non-Spinal) Indication(s): Assistance in needle guidance and placement for procedures requiring needle placement in or near specific anatomical locations not easily accessible without such assistance. Exposure Time: Please see nurses notes. Contrast: None used. Fluoroscopic Guidance: I was personally present during the use of fluoroscopy. "Tunnel Vision Technique" used to obtain the best possible view of the target area. Parallax error corrected before commencing the procedure. "Direction-depth-direction" technique used to introduce the needle under continuous pulsed fluoroscopy. Once target was reached, antero-posterior, oblique, and lateral fluoroscopic projection used confirm needle placement in all planes. Images permanently stored in EMR. Interpretation: No contrast injected. I  personally interpreted the imaging intraoperatively. Adequate needle placement confirmed in multiple planes. Permanent images saved into the patient's record.      Description of Procedure #2:  Laterality: Right Levels:  L2, L3, L4, L5, & S1 Medial Branch Level(s) Area Prepped: Posterior Lumbosacral Region Prepping solution: DuraPrep (Iodine Povacrylex [0.7% available iodine] and Isopropyl Alcohol, 74% w/w) Safety Precautions: Aspiration looking for blood return was conducted prior to all injections. At no point did we inject any substances, as a needle was being advanced. Before injecting, the patient was told to immediately notify me if she was experiencing any new onset of "ringing in the ears, or metallic taste in the mouth". No attempts were made at seeking any paresthesias. Safe injection practices and needle disposal techniques used. Medications properly checked for expiration dates. SDV (single dose vial) medications used. After the completion of the procedure, all disposable equipment used was discarded in the proper designated medical waste containers. Local Anesthesia: Protocol guidelines were followed. The patient was positioned over the fluoroscopy table. The area was prepped in the usual manner. The time-out was completed. The target area was identified using fluoroscopy. A 12-in long, straight, sterile hemostat was used with fluoroscopic guidance to locate the targets for each level blocked. Once located, the skin was marked with an approved surgical skin marker. Once all sites were marked, the skin (epidermis, dermis, and hypodermis), as well as deeper tissues (fat, connective tissue and muscle) were infiltrated with a small amount of a short-acting local anesthetic, loaded on a 10cc syringe with a 25G, 1.5-in  Needle. An appropriate amount of time was allowed for local anesthetics to take effect before proceeding to the next step. Local Anesthetic: Lidocaine 2.0% The unused portion of the  local anesthetic was discarded in the proper designated containers. Technical explanation of process:  L2 Medial Branch Nerve Block (MBB): The target area for the L2 medial branch is at the junction of the postero-lateral aspect of the superior articular process and the superior, posterior, and medial edge of the transverse process of L3. Under fluoroscopic guidance, a Quincke needle was inserted until contact was made with os over the superior postero-lateral aspect of the pedicular shadow (target  area). After negative aspiration for blood, 0.5 mL of the nerve block solution was injected without difficulty or complication. The needle was removed intact. L3 Medial Branch Nerve Block (MBB): The target area for the L3 medial branch is at the junction of the postero-lateral aspect of the superior articular process and the superior, posterior, and medial edge of the transverse process of L4. Under fluoroscopic guidance, a Quincke needle was inserted until contact was made with os over the superior postero-lateral aspect of the pedicular shadow (target area). After negative aspiration for blood, 0.5 mL of the nerve block solution was injected without difficulty or complication. The needle was removed intact. L4 Medial Branch Nerve Block (MBB): The target area for the L4 medial branch is at the junction of the postero-lateral aspect of the superior articular process and the superior, posterior, and medial edge of the transverse process of L5. Under fluoroscopic guidance, a Quincke needle was inserted until contact was made with os over the superior postero-lateral aspect of the pedicular shadow (target area). After negative aspiration for blood, 0.5 mL of the nerve block solution was injected without difficulty or complication. The needle was removed intact. L5 Medial Branch Nerve Block (MBB): The target area for the L5 medial branch is at the junction of the postero-lateral aspect of the superior articular process  and the superior, posterior, and medial edge of the sacral ala. Under fluoroscopic guidance, a Quincke needle was inserted until contact was made with os over the superior postero-lateral aspect of the pedicular shadow (target area). After negative aspiration for blood, 0.5 mL of the nerve block solution was injected without difficulty or complication. The needle was removed intact. S1 Medial Branch Nerve Block (MBB): The target area for the S1 medial branch is at the posterior and inferior 6 o'clock position of the L5-S1 facet joint. Under fluoroscopic guidance, the Quincke needle inserted for the L5 MBB was redirected until contact was made with os over the inferior and postero aspect of the sacrum, at the 6 o' clock position under the L5-S1 facet joint (Target area). After negative aspiration for blood, 0.5 mL of the nerve block solution was injected without difficulty or complication. The needle was removed intact.  Nerve block solution: 0.2% PF-Ropivacaine + Triamcinolone (40 mg/mL) diluted to a final concentration of 4 mg of Triamcinolone/mL of Ropivacaine The unused portion of the solution was discarded in the proper designated containers. Procedural Needles: 22-gauge, 3.5-inch, Quincke needles used for all levels.  Once the entire procedure was completed, the treated area was cleaned, making sure to leave some of the prepping solution back to take advantage of its long term bactericidal properties.   Illustration of the posterior view of the lumbar spine and the posterior neural structures. Laminae of L2 through S1 are labeled. DPRL5, dorsal primary ramus of L5; DPRS1, dorsal primary ramus of S1; DPR3, dorsal primary ramus of L3; FJ, facet (zygapophyseal) joint L3-L4; I, inferior articular process of L4; LB1, lateral branch of dorsal primary ramus of L1; IAB, inferior articular branches from L3 medial branch (supplies L4-L5 facet joint); IBP, intermediate branch plexus; MB3, medial branch of dorsal  primary ramus of L3; NR3, third lumbar nerve root; S, superior articular process of L5; SAB, superior articular branches from L4 (supplies L4-5 facet joint also); TP3, transverse process of L3.  Vitals:   03/19/19 1503 03/19/19 1513 03/19/19 1523 03/19/19 1533  BP: 130/74 123/65 118/73 (!) 127/54  Pulse: 96 100 94 83  Resp: 18 20 (!) 22 (!)  22  Temp:  98.9 F (37.2 C)  99.5 F (37.5 C)  SpO2: 97% 100% 98% 100%  Weight:      Height:       End Time: 1440 hrs.  Imaging Guidance (Spinal) for procedure #2:  Type of Imaging Technique: Fluoroscopy Guidance (Spinal) Indication(s): Assistance in needle guidance and placement for procedures requiring needle placement in or near specific anatomical locations not easily accessible without such assistance. Exposure Time: Please see nurses notes. Contrast: None used. Fluoroscopic Guidance: I was personally present during the use of fluoroscopy. "Tunnel Vision Technique" used to obtain the best possible view of the target area. Parallax error corrected before commencing the procedure. "Direction-depth-direction" technique used to introduce the needle under continuous pulsed fluoroscopy. Once target was reached, antero-posterior, oblique, and lateral fluoroscopic projection used confirm needle placement in all planes. Images permanently stored in EMR. Interpretation: No contrast injected. I personally interpreted the imaging intraoperatively. Adequate needle placement confirmed in multiple planes. Permanent images saved into the patient's record.      Antibiotic Prophylaxis:   Anti-infectives (From admission, onward)   None     Indication(s): None identified  Post-operative Assessment:  Post-procedure Vital Signs:  Pulse/HCG Rate: 83  Temp: 99.5 F (37.5 C) Resp: (!) 22 BP: (!) 127/54 SpO2: 100 %  EBL: None  Complications: No immediate post-treatment complications observed by team, or reported by patient.  Note: The patient tolerated  the entire procedure well. A repeat set of vitals were taken after the procedure and the patient was kept under observation following institutional policy, for this type of procedure. Post-procedural neurological assessment was performed, showing return to baseline, prior to discharge. The patient was provided with post-procedure discharge instructions, including a section on how to identify potential problems. Should any problems arise concerning this procedure, the patient was given instructions to immediately contact us, at any time, without hesitation. In any case, we plan to contact the patient by telephone for a follow-up status report regarding this interventional procedure.  Comments:  No additional relevant information.  Plan of Care  Orders:  Orders Placed This Encounter  Procedures   LUMBAR FACET(MEDIAL BRANCH NERVE BLOCK) MBNB    Scheduling Instructions:     Side: Right-sided     Level: L3-4, L4-5, & L5-S1 Facets (L2, L3, L4, L5, & S1 Medial Branch Nerves)     Sedation: With Sedation.     Timeframe: Today    Order Specific Question:   Where will this procedure be performed?    Answer:   [facility]   SHOULDER INJECTION    Scheduling Instructions:     Side: Left-sided     Sedation: With Sedation.     Timeframe: Today    Order Specific Question:   Where will this procedure be performed?    Answer:   [facility]    Comments:   by Dr. Dossie Arbour   KNEE INJECTION    Hyalgan knee injection. Please order Hyalgan.    Standing Status:   Future    Standing Expiration Date:   04/19/2019    Scheduling Instructions:     Procedure: Intra-articular Hyalgan Knee injection #2     Side: Bilateral     Sedation: None     Timeframe: in two (2) weeks    Order Specific Question:   Where will this procedure be performed?    Answer:   [facility]   DG PAIN [facility] C-ARM 1-60 MIN NO REPORT  Intraoperative interpretation by procedural physician at [place].    Standing Status:   Standing    Number of Occurrences:   1    Order Specific Question:   Reason for exam:    Answer:   Assistance in needle guidance and placement for procedures requiring needle placement in or near specific anatomical locations not easily accessible without such assistance.   Provider attestation of informed consent for procedure/surgical case    I, the ordering provider, attest that I have discussed with the patient the benefits, risks, side effects, alternatives, likelihood of achieving goals and potential problems during recovery for the procedure that I have provided informed consent.    Standing Status:   Standing    Number of Occurrences:   1   Informed Consent Details: Transcribe to consent form and obtain patient signature    Standing Status:   Standing    Number of Occurrences:   1    Order Specific Question:   Procedure    Answer:   Lumbar facet block (medial branch block) under fluoroscopic guidance. (See notes for levels and laterality.)    Order Specific Question:   Surgeon    Answer:   Jashae Wiggs A. Dossie Arbour, MD    Order Specific Question:   Indication/Reason    Answer:   Low back pain with or without lower extremity pain.   Informed Consent Details: Transcribe to consent form and obtain patient signature    Consent Attestation: I, the ordering provider, attest that I have discussed with the patient the benefits, risks, side-effects, alternatives, likelihood of achieving goals, and potential problems during recovery for the procedure that I have provided informed consent.    Standing Status:   Standing    Number of Occurrences:   1    Order Specific Question:   Procedure    Answer:   Left Intra-articular shoulder joint injection under fluoroscopic guidance    Order Specific Question:   Surgeon    Answer:   Almin Livingstone A. 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 every 8 hours (15 mg/day of hydrocodone) MME/day: 15 mg/day.   Medications ordered for procedure: Meds ordered this encounter  Medications   lidocaine (XYLOCAINE) 2 % (with pres) injection 400 mg   lactated ringers infusion 1,000 mL   fentaNYL (SUBLIMAZE) injection 25-50 mcg    Make sure Narcan is available in the pyxis when using this medication. In the event of respiratory depression (RR< 8/min): Titrate NARCAN (naloxone) in increments of 0.1 to 0.2 mg IV at 2-3 minute intervals, until desired degree of reversal.   midazolam (VERSED) 5 MG/5ML injection 1-2 mg    Make sure Flumazenil is available in the pyxis when using this medication. If oversedation occurs, administer 0.2 mg IV over 15 sec. If after 45 sec no response, administer 0.2 mg again over 1 min; may repeat at 1 min intervals; not to exceed 4 doses (1 mg)   ropivacaine (PF) 2 mg/mL (0.2%) (NAROPIN) injection 9 mL   triamcinolone acetonide (KENALOG-40) injection 40 mg   ropivacaine (PF) 2 mg/mL (0.2%) (NAROPIN) injection 9 mL   methylPREDNISolone acetate (DEPO-MEDROL) injection 80 mg   Medications administered: We administered lidocaine, lactated ringers, fentaNYL, midazolam, ropivacaine (PF) 2 mg/mL (0.2%), triamcinolone acetonide, ropivacaine (PF) 2 mg/mL (0.2%), and methylPREDNISolone acetate.  See the medical  record for exact dosing, route, and time of administration.  Follow-up plan:   Return in about 2 weeks (around 04/02/2019) for Procedure (no sedation): (B) Hyalgan S2/#2.      Considering: NOTE:NO Lumbar RFAuntil BMIis at or below 35. Diagnostic bilateral knee genicular nerve block #2 Diagnostic left intra-articular wrist injection (MNB/TPI) #2 Diagnostic bilateral cervical facet nerve block Diagnostic bilateral cervical ESI Possible bilateral cervical facet RFA Diagnostic bilateral intra-articular shoulder injection  #3 Diagnostic bilateral suprascapular nerve block Possible bilateral suprascapular nerve RFA Diagnostic bilateral lumbar epidural steroid injection Diagnostic right lumbar facet nerve block #2 Possible bilateral lumbar facet RFA(NO Lumbar RFAuntil BMIis at or below 35.)   Palliative PRN treatment(s): Palliative bilateral intra-articular Hyalgan knee injection S/N 2/#2under fluoroscopic guidance Palliative bilateral intra-articular knee joint (steroid) #2  Therapeutic left-sided L4-5 LESI #2     Recent Visits Date Type Provider Dept  03/06/19 Office Visit Milinda Pointer, MD [facility]  02/19/19 Procedure visit Milinda Pointer, MD [facility]  12/26/18 Office Visit Milinda Pointer, MD [facility]  Showing recent visits within past 90 days and meeting all other requirements   Today's Visits Date Type Provider Dept  03/19/19 Procedure visit Milinda Pointer, MD [facility]  Showing today's visits and meeting all other requirements   Future Appointments Date Type Provider Dept  04/02/19 Appointment Milinda Pointer, MD [facility]  06/03/19 Appointment Milinda Pointer, MD [facility]  Showing future appointments within next 90 days and meeting all other requirements   Disposition: Discharge home  Discharge Date & Time: 03/19/2019; 1540 hrs.   Primary Care Physician: Hubbard Hartshorn, FNP Location: [facility] Note by: Gaspar Cola, MD Date: 03/19/2019; Time: 4:26 PM  Disclaimer:  Medicine is not an Chief Strategy Officer. The only guarantee in medicine is that nothing is guaranteed. It is important to note that the decision to proceed with this intervention was based on the information collected from the patient. The Data and conclusions were drawn from the patient's questionnaire, the interview, and the physical examination. Because the information was provided in large  part by the patient, it cannot be guaranteed that it has not been purposely or unconsciously manipulated. Every effort has been made to obtain as much relevant data as possible for this evaluation. It is important to note that the conclusions that lead to this procedure are derived in large part from the available data. Always take into account that the treatment will also be dependent on availability of resources and existing treatment guidelines, considered by other Pain Management Practitioners as being common knowledge and practice, at the time of the intervention. For Medico-Legal purposes, it is also important to point out that variation in procedural techniques and pharmacological choices are the acceptable norm. The indications, contraindications, technique, and results of the above procedure should only be interpreted and judged by a Board-Certified Interventional Pain Specialist with extensive familiarity and expertise in the same exact procedure and technique.

## 2019-03-20 ENCOUNTER — Telehealth: Payer: Self-pay | Admitting: *Deleted

## 2019-03-20 NOTE — Telephone Encounter (Signed)
Attempted to reach patient, no answer and mailbox is full.  

## 2019-03-28 ENCOUNTER — Other Ambulatory Visit: Payer: Self-pay | Admitting: Pain Medicine

## 2019-03-29 ENCOUNTER — Other Ambulatory Visit: Admission: RE | Admit: 2019-03-29 | Payer: Medicaid Other | Source: Ambulatory Visit

## 2019-04-02 ENCOUNTER — Ambulatory Visit (HOSPITAL_BASED_OUTPATIENT_CLINIC_OR_DEPARTMENT_OTHER): Payer: Medicaid Other | Admitting: Pain Medicine

## 2019-04-02 ENCOUNTER — Other Ambulatory Visit: Payer: Self-pay

## 2019-04-02 ENCOUNTER — Encounter: Payer: Self-pay | Admitting: Pain Medicine

## 2019-04-02 ENCOUNTER — Ambulatory Visit
Admission: RE | Admit: 2019-04-02 | Discharge: 2019-04-02 | Disposition: A | Discharge status: Home / Self Care | Payer: Medicaid Other | Source: Ambulatory Visit | Attending: Pain Medicine | Admitting: Pain Medicine

## 2019-04-02 VITALS — BP 164/67 | HR 95 | Temp 98.7°F | Resp 18 | Ht 64.0 in | Wt 342.0 lb

## 2019-04-02 DIAGNOSIS — Z6841 Body Mass Index (BMI) 40.0 and over, adult: Secondary | ICD-10-CM | POA: Insufficient documentation

## 2019-04-02 DIAGNOSIS — M170 Bilateral primary osteoarthritis of knee: Secondary | ICD-10-CM | POA: Diagnosis not present

## 2019-04-02 DIAGNOSIS — M25562 Pain in left knee: Secondary | ICD-10-CM | POA: Insufficient documentation

## 2019-04-02 DIAGNOSIS — M25561 Pain in right knee: Secondary | ICD-10-CM | POA: Diagnosis present

## 2019-04-02 DIAGNOSIS — G8929 Other chronic pain: Secondary | ICD-10-CM

## 2019-04-02 MED ORDER — LACTATED RINGERS IV SOLN
1000.0000 mL | Freq: Once | INTRAVENOUS | Status: AC
  Administered 2019-04-02: 1000 mL via INTRAVENOUS

## 2019-04-02 MED ORDER — MIDAZOLAM HCL 5 MG/5ML IJ SOLN
1.0000 mg | INTRAMUSCULAR | Status: DC | PRN
  Administered 2019-04-02: 2 mg via INTRAVENOUS
  Filled 2019-04-02: qty 5

## 2019-04-02 MED ORDER — SODIUM HYALURONATE (VISCOSUP) 20 MG/2ML IX SOSY
2.0000 mL | PREFILLED_SYRINGE | Freq: Once | INTRA_ARTICULAR | Status: AC
  Administered 2019-04-02: 2 mL via INTRA_ARTICULAR

## 2019-04-02 MED ORDER — ROPIVACAINE HCL 2 MG/ML IJ SOLN
5.0000 mL | Freq: Once | INTRAMUSCULAR | Status: AC
  Administered 2019-04-02: 5 mL via INTRA_ARTICULAR
  Filled 2019-04-02: qty 10

## 2019-04-02 MED ORDER — FENTANYL CITRATE (PF) 100 MCG/2ML IJ SOLN
25.0000 ug | INTRAMUSCULAR | Status: DC | PRN
  Administered 2019-04-02: 11:00:00 50 ug via INTRAVENOUS
  Filled 2019-04-02: qty 2

## 2019-04-02 MED ORDER — LIDOCAINE HCL 2 % IJ SOLN
20.0000 mL | Freq: Once | INTRAMUSCULAR | Status: AC
  Administered 2019-04-02: 400 mg
  Filled 2019-04-02: qty 40

## 2019-04-02 MED ORDER — LIDOCAINE HCL (PF) 1 % IJ SOLN
5.0000 mL | Freq: Once | INTRAMUSCULAR | Status: AC
  Administered 2019-04-02: 5 mL
  Filled 2019-04-02: qty 5

## 2019-04-02 NOTE — Patient Instructions (Addendum)
____________________________________________________________________________________________  Post-Procedure Discharge Instructions  Instructions:  Apply ice:   Purpose: This will minimize any swelling and discomfort after procedure.   When: Day of procedure, as soon as you get home.  How: Fill a plastic sandwich bag with crushed ice. Cover it with a small towel and apply to injection site.  How long: (15 min on, 15 min off) Apply for 15 minutes then remove x 15 minutes.  Repeat sequence on day of procedure, until you go to bed.  Apply heat:   Purpose: To treat any soreness and discomfort from the procedure.  When: Starting the next day after the procedure.  How: Apply heat to procedure site starting the day following the procedure.  How long: May continue to repeat daily, until discomfort goes away.  Food intake: Start with clear liquids (like water) and advance to regular food, as tolerated.   Physical activities: Keep activities to a minimum for the first 8 hours after the procedure. After that, then as tolerated.  Driving: If you have received any sedation, be responsible and do not drive. You are not allowed to drive for 24 hours after having sedation.  Blood thinner: (Applies only to those taking blood thinners) You may restart your blood thinner 6 hours after your procedure.  Insulin: (Applies only to Diabetic patients taking insulin) As soon as you can eat, you may resume your normal dosing schedule.  Infection prevention: Keep procedure site clean and dry. Shower daily and clean area with soap and water.  Post-procedure Pain Diary: Extremely important that this be done correctly and accurately. Recorded information will be used to determine the next step in treatment. For the purpose of accuracy, follow these rules:  Evaluate only the area treated. Do not report or include pain from an untreated area. For the purpose of this evaluation, ignore all other areas of pain,  except for the treated area.  After your procedure, avoid taking a long nap and attempting to complete the pain diary after you wake up. Instead, set your alarm clock to go off every hour, on the hour, for the initial 8 hours after the procedure. Document the duration of the numbing medicine, and the relief you are getting from it.  Do not go to sleep and attempt to complete it later. It will not be accurate. If you received sedation, it is likely that you were given a medication that may cause amnesia. Because of this, completing the diary at a later time may cause the information to be inaccurate. This information is needed to plan your care.  Follow-up appointment: Keep your post-procedure follow-up evaluation appointment after the procedure (usually 2 weeks for most procedures, 6 weeks for radiofrequencies). DO NOT FORGET to bring you pain diary with you.   Expect: (What should I expect to see with my procedure?)  From numbing medicine (AKA: Local Anesthetics): Numbness or decrease in pain. You may also experience some weakness, which if present, could last for the duration of the local anesthetic.  Onset: Full effect within 15 minutes of injected.  Duration: It will depend on the type of local anesthetic used. On the average, 1 to 8 hours.   From steroids (Applies only if steroids were used): Decrease in swelling or inflammation. Once inflammation is improved, relief of the pain will follow.  Onset of benefits: Depends on the amount of swelling present. The more swelling, the longer it will take for the benefits to be seen. In some cases, up to 10 days.    Duration: Steroids will stay in the system x 2 weeks. Duration of benefits will depend on multiple posibilities including persistent irritating factors.  Side-effects: If present, they may typically last 2 weeks (the duration of the steroids).  Frequent: Cramps (if they occur, drink Gatorade and take over-the-counter Magnesium 450-500 mg  once to twice a day); water retention with temporary weight gain; increases in blood sugar; decreased immune system response; increased appetite.  Occasional: Facial flushing (red, warm cheeks); mood swings; menstrual changes.  Uncommon: Long-term decrease or suppression of natural hormones; bone thinning. (These are more common with higher doses or more frequent use. This is why we prefer that our patients avoid having any injection therapies in other practices.)   Very Rare: Severe mood changes; psychosis; aseptic necrosis.  From procedure: Some discomfort is to be expected once the numbing medicine wears off. This should be minimal if ice and heat are applied as instructed.  Call if: (When should I call?)  You experience numbness and weakness that gets worse with time, as opposed to wearing off.  New onset bowel or bladder incontinence. (Applies only to procedures done in the spine)  Emergency Numbers:  Durning business hours (Monday - Thursday, 8:00 AM - 4:00 PM) (Friday, 9:00 AM - 12:00 Noon): [phone]  After hours: [phone]  NOTE: If you are having a problem and are unable connect with, or to talk to a provider, then go to your nearest urgent care or emergency department. If the problem is serious and urgent, please call 911. ____________________________________________________________________________________________   ____________________________________________________________________________________________  Preparing for your procedure (without sedation)  Procedure appointments are limited to planned procedures: . No Prescription Refills. . No disability issues will be discussed. . No medication changes will be discussed.  Instructions: . Oral Intake: Do not eat or drink anything for at least 3 hours prior to your procedure. . Transportation: Unless otherwise stated by your physician, you may drive yourself after the procedure. . Blood Pressure Medicine:  Take your blood pressure medicine with a sip of water the morning of the procedure. . Blood thinners: Notify our staff if you are taking any blood thinners. Depending on which one you take, there will be specific instructions on how and when to stop it. . Diabetics on insulin: Notify the staff so that you can be scheduled 1st case in the morning. If your diabetes requires high dose insulin, take only  of your normal insulin dose the morning of the procedure and notify the staff that you have done so. . Preventing infections: Shower with an antibacterial soap the morning of your procedure.  . Build-up your immune system: Take 1000 mg of Vitamin C with every meal (3 times a day) the day prior to your procedure. . Antibiotics: Inform the staff if you have a condition or reason that requires you to take antibiotics before dental procedures. . Pregnancy: If you are pregnant, call and cancel the procedure. . Sickness: If you have a cold, fever, or any active infections, call and cancel the procedure. . Arrival: You must be in the facility at least 30 minutes prior to your scheduled procedure. . Children: Do not bring any children with you. . Dress appropriately: Bring dark clothing that you would not mind if they get stained. . Valuables: Do not bring any jewelry or valuables.  Reasons to call and reschedule or cancel your procedure: (Following these recommendations will minimize the risk of a serious complication.) . Surgeries: Avoid having procedures within 2 weeks   of any surgery. (Avoid for 2 weeks before or after any surgery). . Flu Shots: Avoid having procedures within 2 weeks of a flu shots or . (Avoid for 2 weeks before or after immunizations). . Barium: Avoid having a procedure within 7-10 days after having had a radiological study involving the use of radiological contrast. (Myelograms, Barium swallow or enema study). . Heart attacks: Avoid any elective procedures or surgeries for the initial 6  months after a "Myocardial Infarction" (Heart Attack). . Blood thinners: It is imperative that you stop these medications before procedures. Let us know if you if you take any blood thinner.  . Infection: Avoid procedures during or within two weeks of an infection (including chest colds or gastrointestinal problems). Symptoms associated with infections include: Localized redness, fever, chills, night sweats or profuse sweating, burning sensation when voiding, cough, congestion, stuffiness, runny nose, sore throat, diarrhea, nausea, vomiting, cold or Flu symptoms, recent or current infections. It is specially important if the infection is over the area that we intend to treat. Marland Kitchen Heart and lung problems: Symptoms that may suggest an active cardiopulmonary problem include: cough, chest pain, breathing difficulties or shortness of breath, dizziness, ankle swelling, uncontrolled high or unusually low blood pressure, and/or palpitations. If you are experiencing any of these symptoms, cancel your procedure and contact your primary care physician for an evaluation.  Remember:  Regular Business hours are:  Monday to Thursday 8:00 AM to 4:00 PM  Provider's Schedule: Milinda Pointer, MD:  Procedure days: Tuesday and Thursday 7:30 AM to 4:00 PM  Gillis Santa, MD:  Procedure days: Monday and Wednesday 7:30 AM to 4:00 PM ____________________________________________________________________________________________   Pain Management Discharge Instructions  General Discharge Instructions :  If you need to reach your doctor call: Monday-Friday 8:00 am - 4:00 pm at [phone] or toll free [phone].  After clinic hours [phone] to have operator reach doctor.  Bring all of your medication bottles to all your appointments in the pain clinic.  To cancel or reschedule your appointment with Pain Management please remember to call 24 hours in advance to avoid a fee.  Refer to the educational materials  which you have been given on: General Risks, I had my Procedure. Discharge Instructions, Post Sedation.  Post Procedure Instructions:  The drugs you were given will stay in your system until tomorrow, so for the next 24 hours you should not drive, make any legal decisions or drink any alcoholic beverages.  You may eat anything you prefer, but it is better to start with liquids then soups and crackers, and gradually work up to solid foods.  Please notify your doctor immediately if you have any unusual bleeding, trouble breathing or pain that is not related to your normal pain.  Depending on the type of procedure that was done, some parts of your body may feel week and/or numb.  This usually clears up by tonight or the next day.  Walk with the use of an assistive device or accompanied by an adult for the 24 hours.  You may use ice on the affected area for the first 24 hours.  Put ice in a Ziploc bag and cover with a towel and place against area 15 minutes on 15 minutes off.  You may switch to heat after 24 hours.

## 2019-04-02 NOTE — Progress Notes (Signed)
Patient's Name: Lauren [name]  MRN: [id number]  Referring Provider: Hubbard Hartshorn, FNP  DOB: 12-13-1975  PCP: Hubbard Hartshorn, FNP  DOS: 04/02/2019  Note by: Gaspar Cola, MD  Service setting: Ambulatory outpatient  Specialty: Interventional Pain Management  Patient type: Established  Location: [facility] (AMB) Pain Management Facility  Visit type: Interventional Procedure   Primary Reason for Visit: Interventional Pain Management Treatment. CC: Knee Pain (bilateral)  Procedure:          Anesthesia, Analgesia, Anxiolysis:  Type: Therapeutic Intra-Articular Hyalgan Knee Injection S2/N3  Region: Lateral infrapatellar Knee Region Level: Knee Joint Laterality: Bilateral  Type: Moderate (Conscious) Sedation combined with Local Anesthesia Indication(s): Analgesia and Anxiety Local Anesthetic: Lidocaine 1-2% Route: Intravenous (IV) IV Access: Secured Sedation: Meaningful verbal contact was maintained at all times during the procedure   Position: Supine w/ knee bent 20 to 30 degrees   Indications: 1. Chronic knee pain (Primary Area of Pain) (Bilateral) (L>R)   2. Osteoarthritis of knee (Bilateral)   3. Morbid obesity with BMI of 50.0-59.9, adult (HCC)    Pain Score: Pre-procedure: 8 /10 Post-procedure: 0-No pain/10   Pertinent Labs  COVID-19 screennig: Lab Results  Component Value Date   [place] NEGATIVE 03/15/2019   Note: The patient indicates that this Thursday she will be undergoing bariatric surgery.  Pre-op Assessment:  Lauren [name] is a 43 y.o. (year old), female patient, seen today for interventional treatment. She  has a past surgical history that includes Rotator cuff repair; Knee surgery; Cholecystectomy; Cesarean section (2010); Cesarean section (2011); Tonsillectomy; Cardiac electrophysiology study and ablation (01/18/2018); and Excision mass neck (Left, 10/11/2018). Lauren [name] has a current medication list which includes the following prescription(s): albuterol,  aspirin ec, beclomethasone, esomeprazole, fluticasone, furosemide, hydrocodone-acetaminophen, hydrocodone-acetaminophen, hydrocodone-acetaminophen, ibuprofen, losartan-hydrochlorothiazide, medroxyprogesterone acetate, metoprolol succinate, pregabalin, and stiolto respimat, and the following Facility-Administered Medications: fentanyl and midazolam. Her primarily concern today is the Knee Pain (bilateral)  Initial Vital Signs:  Pulse/HCG Rate: 95ECG Heart Rate: 93 Temp: 98.7 F (37.1 C) Resp: 18 BP: (!) 155/81 SpO2: 100 %  BMI: Estimated body mass index is 58.7 kg/m as calculated from the following:   Height as of this encounter: 5\' 4"  (1.626 m).   Weight as of this encounter: 342 lb (155.1 kg).  Risk Assessment: Allergies: Reviewed. She is allergic to nucynta [tapentadol] and tape.  Allergy Precautions: None required Coagulopathies: Reviewed. None identified.  Blood-thinner therapy: None at this time Active Infection(s): Reviewed. None identified. Lauren [name] is afebrile  Site Confirmation: Lauren [name] was asked to confirm the procedure and laterality before marking the site Procedure checklist: Completed Consent: Before the procedure and under the influence of no sedative(s), amnesic(s), or anxiolytics, the patient was informed of the treatment options, risks and possible complications. To fulfill our ethical and legal obligations, as recommended by the American Medical Association's Code of Ethics, I have informed the patient of my clinical impression; the nature and purpose of the treatment or procedure; the risks, benefits, and possible complications of the intervention; the alternatives, including doing nothing; the risk(s) and benefit(s) of the alternative treatment(s) or procedure(s); and the risk(s) and benefit(s) of doing nothing. The patient was provided information about the general risks and possible complications associated with the procedure. These may include, but are not  limited to: failure to achieve desired goals, infection, bleeding, organ or nerve damage, allergic reactions, paralysis, and death. In addition, the patient was informed of those risks and complications associated to the procedure, such as  failure to decrease pain; infection; bleeding; organ or nerve damage with subsequent damage to sensory, motor, and/or autonomic systems, resulting in permanent pain, numbness, and/or weakness of one or several areas of the body; allergic reactions; (i.e.: anaphylactic reaction); and/or death. Furthermore, the patient was informed of those risks and complications associated with the medications. These include, but are not limited to: allergic reactions (i.e.: anaphylactic or anaphylactoid reaction(s)); adrenal axis suppression; blood sugar elevation that in diabetics may result in ketoacidosis or comma; water retention that in patients with history of congestive heart failure may result in shortness of breath, pulmonary edema, and decompensation with resultant heart failure; weight gain; swelling or edema; medication-induced neural toxicity; particulate matter embolism and blood vessel occlusion with resultant organ, and/or nervous system infarction; and/or aseptic necrosis of one or more joints. Finally, the patient was informed that Medicine is not an exact science; therefore, there is also the possibility of unforeseen or unpredictable risks and/or possible complications that may result in a catastrophic outcome. The patient indicated having understood very clearly. We have given the patient no guarantees and we have made no promises. Enough time was given to the patient to ask questions, all of which were answered to the patient's satisfaction. Lauren [name] has indicated that she wanted to continue with the procedure. Attestation: I, the ordering provider, attest that I have discussed with the patient the benefits, risks, side-effects, alternatives, likelihood of achieving  goals, and potential problems during recovery for the procedure that I have provided informed consent. Date  Time: 04/02/2019 10:47 AM  Pre-Procedure Preparation:  Monitoring: As per clinic protocol. Respiration, ETCO2, SpO2, BP, heart rate and rhythm monitor placed and checked for adequate function Safety Precautions: Patient was assessed for positional comfort and pressure points before starting the procedure. Time-out: I initiated and conducted the "Time-out" before starting the procedure, as per protocol. The patient was asked to participate by confirming the accuracy of the "Time Out" information. Verification of the correct person, site, and procedure were performed and confirmed by me, the nursing staff, and the patient. "Time-out" conducted as per Joint Commission's Universal Protocol (UP.01.01.01). Time: 1126  Description of Procedure:          Target Area: Knee Joint Approach: Just above the Lateral tibial plateau, lateral to the infrapatellar tendon. Area Prepped: Entire knee area, from the mid-thigh to the mid-shin. Prepping solution: DuraPrep (Iodine Povacrylex [0.7% available iodine] and Isopropyl Alcohol, 74% w/w) Safety Precautions: Aspiration looking for blood return was conducted prior to all injections. At no point did we inject any substances, as a needle was being advanced. No attempts were made at seeking any paresthesias. Safe injection practices and needle disposal techniques used. Medications properly checked for expiration dates. SDV (single dose vial) medications used. Description of the Procedure: Protocol guidelines were followed. The patient was placed in position over the fluoroscopy table. The target area was identified and the area prepped in the usual manner. Skin & deeper tissues infiltrated with local anesthetic. Appropriate amount of time allowed to pass for local anesthetics to take effect. The procedure needles were then advanced to the target area. Proper needle  placement secured. Negative aspiration confirmed. Solution injected in intermittent fashion, asking for systemic symptoms every 0.5cc of injectate. The needles were then removed and the area cleansed, making sure to leave some of the prepping solution back to take advantage of its long term bactericidal properties. Vitals:   04/02/19 1132 04/02/19 1142 04/02/19 1152 04/02/19 1200  BP: 133/82 137/71 Marland Kitchen)  164/67 (!) 164/67  Pulse:      Resp: (!) 24 18 16 18   Temp:    (P) 98.5 F (36.9 C)  TempSrc:      SpO2: 97% 100% 99% 98%  Weight:      Height:        Start Time: 1126 hrs. End Time: 1131 hrs. Materials:  Needle(s) Type: Regular needle Gauge: 25G Length: 1.5-in Medication(s): Please see orders for medications and dosing details.  Imaging Guidance:          Type of Imaging Technique: Fluoroscopy Guidance (Non-spinal) Indication(s): Morbid obesity. Assistance in needle guidance and placement for procedures requiring needle placement in or near specific anatomical locations impossible to access without such assistance. Exposure Time: Please see nurses notes. Contrast: None used. Fluoroscopic Guidance: I was personally present during the use of fluoroscopy. "Tunnel Vision Technique" used to obtain the best possible view of the target area. Parallax error corrected before commencing the procedure. "Direction-depth-direction" technique used to introduce the needle under continuous pulsed fluoroscopy. Once target was reached, antero-posterior, oblique, and lateral fluoroscopic projection used confirm needle placement in all planes. Images permanently stored in EMR. Ultrasound Guidance: N/A Interpretation: No contrast injected. I personally interpreted the imaging intraoperatively. Adequate needle placement confirmed in multiple planes. Permanent images saved into the patient's record.  Antibiotic Prophylaxis:   Anti-infectives (From admission, onward)   None     Indication(s): None  identified  Post-operative Assessment:  Post-procedure Vital Signs:  Pulse/HCG Rate: 9592 Temp: (P) 98.5 F (36.9 C) Resp: 18 BP: (!) 164/67 SpO2: 98 %  EBL: None  Complications: No immediate post-treatment complications observed by team, or reported by patient.  Note: The patient tolerated the entire procedure well. A repeat set of vitals were taken after the procedure and the patient was kept under observation following institutional policy, for this type of procedure. Post-procedural neurological assessment was performed, showing return to baseline, prior to discharge. The patient was provided with post-procedure discharge instructions, including a section on how to identify potential problems. Should any problems arise concerning this procedure, the patient was given instructions to immediately contact us, at any time, without hesitation. In any case, we plan to contact the patient by telephone for a follow-up status report regarding this interventional procedure.  Comments:  No additional relevant information.  Plan of Care  Orders:  Orders Placed This Encounter  Procedures  . KNEE INJECTION    Hyalgan knee injection to be done by MD.    Scheduling Instructions:     Procedure: Intra-articular Hyalgan Knee injection #3     Side(s): Bilateral Knee     Sedation: None     Timeframe: Today    Order Specific Question:   Where will this procedure be performed?    Answer:   [facility]  . KNEE INJECTION    Hyalgan knee injection. Please order Hyalgan.    Standing Status:   Future    Standing Expiration Date:   05/03/2019    Scheduling Instructions:     Procedure: Intra-articular Hyalgan Knee injection #4     Side: Bilateral     Sedation: None     Timeframe: in two (2) weeks    Order Specific Question:   Where will this procedure be performed?    Answer:   [facility]  . DG PAIN CLINIC C-ARM 1-60 MIN NO REPORT    Intraoperative interpretation by procedural  physician at [place].    Standing Status:  Standing    Number of Occurrences:   1    Order Specific Question:   Reason for exam:    Answer:   Assistance in needle guidance and placement for procedures requiring needle placement in or near specific anatomical locations not easily accessible without such assistance.  . Provider attestation of informed consent for procedure/surgical case    I, the ordering provider, attest that I have discussed with the patient the benefits, risks, side effects, alternatives, likelihood of achieving goals and potential problems during recovery for the procedure that I have provided informed consent.    Standing Status:   Standing    Number of Occurrences:   1  . Informed Consent Details: Transcribe to consent form and obtain patient signature    Standing Status:   Standing    Number of Occurrences:   1    Order Specific Question:   Procedure    Answer:   Bilateral intra-articular viscosupplementation knee injection with Hyalgan    Order Specific Question:   Surgeon    Answer:   Lutricia Widjaja A. Dossie Arbour, MD    Order Specific Question:   Indication/Reason    Answer:   Bilateral knee osteoarthritis and arthralgia   Chronic Opioid Analgesic:  Hydrocodone/APAP 5/325, 1 tab PO q 8 hrs (15 mg/day of hydrocodone) (has enough until 06/11/2019) MME/day: 15 mg/day.   Medications ordered for procedure: Meds ordered this encounter  Medications  . lidocaine (XYLOCAINE) 2 % (with pres) injection 400 mg  . lidocaine (PF) (XYLOCAINE) 1 % injection 5 mL  . ropivacaine (PF) 2 mg/mL (0.2%) (NAROPIN) injection 5 mL  . Sodium Hyaluronate SOSY 2 mL  . Sodium Hyaluronate SOSY 2 mL  . lactated ringers infusion 1,000 mL  . midazolam (VERSED) 5 MG/5ML injection 1-2 mg    Make sure Flumazenil is available in the pyxis when using this medication. If oversedation occurs, administer 0.2 mg IV over 15 sec. If after 45 sec no response, administer 0.2 mg again over 1 min; may  repeat at 1 min intervals; not to exceed 4 doses (1 mg)  . fentaNYL (SUBLIMAZE) injection 25-50 mcg    Make sure Narcan is available in the pyxis when using this medication. In the event of respiratory depression (RR< 8/min): Titrate NARCAN (naloxone) in increments of 0.1 to 0.2 mg IV at 2-3 minute intervals, until desired degree of reversal.   Medications administered: We administered lidocaine, lidocaine (PF), ropivacaine (PF) 2 mg/mL (0.2%), Sodium Hyaluronate, Sodium Hyaluronate, lactated ringers, midazolam, and fentaNYL.  See the medical record for exact dosing, route, and time of administration.  Follow-up plan:   Return in about 6 weeks (around 05/14/2019) for Procedure (no sedation): (B) Hyalgan knee inj #4 (After she recovers from her bariatric surgery.).       Considering: NOTE:NO Lumbar RFAuntil BMIis at or below 35. Diagnostic bilateral knee genicular nerve block #2 Diagnostic left intra-articular wrist injection (MNB/TPI) #2 Diagnostic bilateral cervical facet nerve block Diagnostic bilateral cervical ESI Possible bilateral cervical facet RFA Diagnostic bilateral intra-articular shoulder injection #3 Diagnostic bilateral suprascapular nerve block Possible bilateral suprascapular nerve RFA Diagnostic bilateral LESI Diagnostic right lumbar facet nerve block #2 Possible bilateral lumbar facet RFA(NO Lumbar RFAuntil BMIis at or below 35.)   Palliative PRN treatment(s): Palliative bilateral intra-articular Hyalgan knee injection S2N3under fluoroscopic guidance Palliative bilateral intra-articular knee joint (steroid) #2  Therapeutic left-sided L4-5 LESI #2      Recent Visits Date Type Provider Dept  03/19/19 Procedure visit Milinda Pointer, MD [facility]  03/06/19 Office Visit Milinda Pointer, MD [facility]  02/19/19 Procedure visit Milinda Pointer, MD [facility]  Showing recent visits within past 90 days and  meeting all other requirements   Today's Visits Date Type Provider Dept  04/02/19 Procedure visit Milinda Pointer, MD [facility]  Showing today's visits and meeting all other requirements   Future Appointments Date Type Provider Dept  05/16/19 Appointment Milinda Pointer, MD [facility]  06/03/19 Appointment Milinda Pointer, MD [facility]  Showing future appointments within next 90 days and meeting all other requirements   Disposition: Discharge home  Discharge Date & Time: 04/02/2019; 1205 hrs.   Primary Care Physician: Hubbard Hartshorn, FNP Location: [facility] Note by: Gaspar Cola, MD Date: 04/02/2019; Time: 12:43 PM  Disclaimer:  Medicine is not an Chief Strategy Officer. The only guarantee in medicine is that nothing is guaranteed. It is important to note that the decision to proceed with this intervention was based on the information collected from the patient. The Data and conclusions were drawn from the patient's questionnaire, the interview, and the physical examination. Because the information was provided in large part by the patient, it cannot be guaranteed that it has not been purposely or unconsciously manipulated. Every effort has been made to obtain as much relevant data as possible for this evaluation. It is important to note that the conclusions that lead to this procedure are derived in large part from the available data. Always take into account that the treatment will also be dependent on availability of resources and existing treatment guidelines, considered by other Pain Management Practitioners as being common knowledge and practice, at the time of the intervention. For Medico-Legal purposes, it is also important to point out that variation in procedural techniques and pharmacological choices are the acceptable norm. The indications, contraindications, technique, and results of the above procedure should  only be interpreted and judged by a Board-Certified Interventional Pain Specialist with extensive familiarity and expertise in the same exact procedure and technique.

## 2019-04-02 NOTE — Progress Notes (Signed)
Safety precautions to be maintained throughout the outpatient stay will include: orient to surroundings, keep bed in low position, maintain call bell within reach at all times, provide assistance with transfer out of bed and ambulation.  

## 2019-04-03 ENCOUNTER — Telehealth: Payer: Self-pay

## 2019-04-03 NOTE — Telephone Encounter (Signed)
Pt was called and stated that she was little sore but doing ok.

## 2019-04-04 DIAGNOSIS — Z9884 Bariatric surgery status: Secondary | ICD-10-CM | POA: Insufficient documentation

## 2019-05-13 ENCOUNTER — Encounter: Payer: Medicaid Other | Admitting: Certified Nurse Midwife

## 2019-05-16 ENCOUNTER — Ambulatory Visit: Payer: Medicaid Other | Admitting: Pain Medicine

## 2019-05-27 ENCOUNTER — Encounter: Payer: Medicaid Other | Admitting: Certified Nurse Midwife

## 2019-05-30 ENCOUNTER — Ambulatory Visit: Payer: Medicaid Other | Admitting: Pain Medicine

## 2019-05-30 ENCOUNTER — Telehealth: Payer: Self-pay

## 2019-05-30 ENCOUNTER — Other Ambulatory Visit: Payer: Self-pay

## 2019-05-30 ENCOUNTER — Emergency Department: Payer: Medicaid Other

## 2019-05-30 ENCOUNTER — Encounter: Payer: Self-pay | Admitting: Emergency Medicine

## 2019-05-30 DIAGNOSIS — Y939 Activity, unspecified: Secondary | ICD-10-CM | POA: Diagnosis not present

## 2019-05-30 DIAGNOSIS — Z79899 Other long term (current) drug therapy: Secondary | ICD-10-CM | POA: Diagnosis not present

## 2019-05-30 DIAGNOSIS — J45909 Unspecified asthma, uncomplicated: Secondary | ICD-10-CM | POA: Diagnosis not present

## 2019-05-30 DIAGNOSIS — S86812A Strain of other muscle(s) and tendon(s) at lower leg level, left leg, initial encounter: Secondary | ICD-10-CM | POA: Insufficient documentation

## 2019-05-30 DIAGNOSIS — M79662 Pain in left lower leg: Secondary | ICD-10-CM | POA: Diagnosis not present

## 2019-05-30 DIAGNOSIS — Y929 Unspecified place or not applicable: Secondary | ICD-10-CM | POA: Diagnosis not present

## 2019-05-30 DIAGNOSIS — I10 Essential (primary) hypertension: Secondary | ICD-10-CM | POA: Diagnosis not present

## 2019-05-30 DIAGNOSIS — Z7982 Long term (current) use of aspirin: Secondary | ICD-10-CM | POA: Insufficient documentation

## 2019-05-30 DIAGNOSIS — Y999 Unspecified external cause status: Secondary | ICD-10-CM | POA: Diagnosis not present

## 2019-05-30 DIAGNOSIS — X58XXXA Exposure to other specified factors, initial encounter: Secondary | ICD-10-CM | POA: Diagnosis not present

## 2019-05-30 DIAGNOSIS — S8992XA Unspecified injury of left lower leg, initial encounter: Secondary | ICD-10-CM | POA: Diagnosis present

## 2019-05-30 NOTE — Telephone Encounter (Signed)
Unable to reach patient. Voicemail full unable to leave message.

## 2019-05-30 NOTE — ED Triage Notes (Signed)
Patient ambulatory to triage with steady gait, without difficulty or distress noted, mask place; pt reports pain & swelling to left LE for few days; denies any accomp symptoms

## 2019-05-31 ENCOUNTER — Emergency Department: Payer: Medicaid Other

## 2019-05-31 ENCOUNTER — Emergency Department
Admission: EM | Admit: 2019-05-31 | Discharge: 2019-05-31 | Disposition: A | Discharge status: Home / Self Care | Payer: Medicaid Other | Attending: Emergency Medicine | Admitting: Emergency Medicine

## 2019-05-31 ENCOUNTER — Encounter: Payer: Self-pay | Admitting: Pain Medicine

## 2019-05-31 DIAGNOSIS — S86812A Strain of other muscle(s) and tendon(s) at lower leg level, left leg, initial encounter: Secondary | ICD-10-CM

## 2019-05-31 MED ORDER — TRAMADOL HCL 50 MG PO TABS: 50.0000 mg | ORAL_TABLET | Freq: Four times a day (QID) | ORAL | 0 refills | Status: DC | PRN

## 2019-05-31 MED ORDER — OXYCODONE-ACETAMINOPHEN 5-325 MG PO TABS
1.0000 | ORAL_TABLET | Freq: Once | ORAL | Status: AC
  Administered 2019-05-31: 1 via ORAL
  Filled 2019-05-31: qty 1

## 2019-05-31 NOTE — ED Provider Notes (Signed)
[facility] Emergency Department Provider Note    First MD Initiated Contact with Patient 05/31/19 [id number]     (approximate)  I have reviewed the triage vital signs and the nursing notes.   HISTORY  Chief Complaint Leg Pain    HPI Lauren [name] is a 43 y.o. female with below listed previous medical conditions presents to the emergency department secondary to 10 out of 10 nontraumatic left lower extremity pain times "few days".  Patient denies any chest pain no shortness of breath.  Patient states pain worse with ambulation.  Patient denies any recent fever.        Past Medical History:  Diagnosis Date   Advanced maternal age in multigravida    Asthma    Depression    GERD (gastroesophageal reflux disease)    Grand multiparity    Headache    History of carpal tunnel surgery of left wrist 05/2018   Hypertension    Leg fracture    Obesity, morbid, BMI 40.0-49.9 ([place])    Post traumatic stress disorder    Pre-diabetes    Pre-eclampsia    SVT (supraventricular tachycardia) ([place])    a. prior report of SVT req adenosine;  b. 2011 Holter @ [facility] reportedly showed "irregular heartbeat"; c. 05/2016 Holter: "basically unremarkable" per Dr. Clayborn Bigness;  d. 07/2016 Echo: EF 50-55%. Nl RV fxn.   Wrist fracture, closed    bilateral    Patient Active Problem List   Diagnosis Date Noted   Spondylosis without myelopathy or radiculopathy, lumbosacral region 03/19/2019   BMI 50.0-59.9, adult ([place]) 03/11/2019   Osteoarthritis involving multiple joints 03/06/2019   Lumbar facet syndrome (Bilateral) (R>L) 03/06/2019   Rotator cuff tendinitis, left 02/25/2019   Rotator cuff tendinitis, right 02/25/2019   Chronic shoulder pain (Left) 12/26/2018   Lipoma of neck (Left) 12/26/2018   Acute pain of left shoulder 12/26/2018   Chronic forearm pain (Left) 09/27/2018   Osteoarthritis of shoulder (Bilateral) 08/30/2018   Chronic knee pain (Primary  Area of Pain) (Left) 08/15/2018   Lipoma of shoulder (Left) 08/15/2018   Lumbar spondylosis 08/13/2018   Iron deficiency anemia 07/18/2018   Thrombocytosis ([place]) 07/13/2018   Leukocytosis 07/13/2018   Anemia 07/13/2018   Prediabetes 07/13/2018   Asthma 07/12/2018   Lymphadenopathy, cervical 07/12/2018   Morbid obesity with BMI of 50.0-59.9, adult ([place]) 07/09/2018   Neurogenic pain 07/09/2018   Impaired ambulation 07/09/2018   DDD (degenerative disc disease), lumbar 05/15/2018   Osteoarthritis of knee (Bilateral) 04/12/2018   Cervicalgia 04/12/2018   Chronic low back pain (Fifth Area of Pain) (Bilateral) (R>L) w/o sciatica 04/12/2018   Elevated C-reactive protein (CRP) 04/04/2018   Elevated sed rate 04/04/2018   Chronic musculoskeletal pain 04/04/2018   Chronic wrist pain (Secondary Area of Pain) (Left) 03/14/2018   Chronic shoulder pain (Fourth Area of Pain) (Bilateral) (L>R) 03/14/2018   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 (Bilateral) 03/14/2018   Chronic upper extremity pain (Left) 03/14/2018   Chronic neck pain (Tertiary Area of Pain) (Bilateral) (L>R) 03/14/2018   Chronic lower extremity pain (Bilateral) 03/14/2018   Chronic sacroiliac joint pain 03/14/2018   Chronic pain syndrome 03/14/2018   Opiate use 03/14/2018   Pharmacologic therapy 03/14/2018   Disorder of skeletal system 03/14/2018   Problems influencing health status 03/14/2018   History of maternal deep vein thrombosis (DVT) 09/25/2017   Transaminitis 08/17/2017   Hx of abnormal cervical Pap smear 05/31/2017   SVT (supraventricular tachycardia) ([place]) 10/15/2016  Chest pain 09/23/2016   Depression 12/01/2014   Fibromyalgia 12/01/2014   Gastro-esophageal reflux disease without esophagitis 12/01/2014   PTSD (post-traumatic stress disorder) 12/01/2014   Stress incontinence 12/01/2014   Chronic pain in right shoulder 11/13/2014   Chronic knee pain (Primary  Area of Pain) (Bilateral) (L>R) 07/18/2014   Chronic, continuous use of opioids 07/18/2014   HTN (hypertension), benign 07/11/2014   Morbid obesity ([place]) 05/22/2014   Chronic daily headache 05/22/2014   Chronic generalized pain 05/22/2014   Heart palpitations 05/22/2014   Snoring 05/22/2014    Past Surgical History:  Procedure Laterality Date   CARDIAC ELECTROPHYSIOLOGY STUDY AND ABLATION  01/18/2018   CESAREAN SECTION  2010   CESAREAN SECTION  2011   CHOLECYSTECTOMY     EXCISION MASS NECK Left 10/11/2018   Procedure: REMOVAL OF LEFT NECK LUMP;  Surgeon: Benjamine Sprague, DO;  Location: [facility] ORS;  Service: General;  Laterality: Left;   KNEE SURGERY     ROTATOR CUFF REPAIR     TONSILLECTOMY      Prior to Admission medications   Medication Sig Start Date End Date Taking? Authorizing Provider  albuterol (PROVENTIL HFA;VENTOLIN HFA) 108 (90 Base) MCG/ACT inhaler Inhale 2 puffs into the lungs every 6 (six) hours as needed for wheezing or shortness of breath. 07/06/18   Poulose, Bethel Born, NP  aspirin EC 81 MG tablet Take 81 mg by mouth daily.    [provider]  beclomethasone (QVAR REDIHALER) 80 MCG/ACT inhaler Inhale 1 puff into the lungs 2 (two) times daily. 07/12/18 07/12/19  Flora Lipps, MD  esomeprazole (NEXIUM) 40 MG capsule Take 1 capsule (40 mg total) by mouth daily at 12 noon. 07/06/18   Poulose, Bethel Born, NP  fluticasone (FLOVENT HFA) 220 MCG/ACT inhaler Inhale 2 puffs into the lungs 2 (two) times daily. 07/20/18 07/20/19  Flora Lipps, MD  furosemide (LASIX) 40 MG tablet Take 40 mg by mouth 3 (three) times a week.  04/09/18   [provider]  HYDROcodone-acetaminophen (NORCO/VICODIN) 5-325 MG tablet Take 1 tablet by mouth every 8 (eight) hours as needed for up to 30 days for severe pain. Must last 30 days 03/13/19 04/12/19  Milinda Pointer, MD  HYDROcodone-acetaminophen (NORCO/VICODIN) 5-325 MG tablet Take 1 tablet by mouth every 8 (eight) hours  as needed for up to 30 days for severe pain. Must last 30 days 04/12/19 05/12/19  Milinda Pointer, MD  HYDROcodone-acetaminophen (NORCO/VICODIN) 5-325 MG tablet Take 1 tablet by mouth every 8 (eight) hours as needed for up to 30 days for severe pain. Must last 30 days 05/12/19 06/11/19  Milinda Pointer, MD  ibuprofen (ADVIL) 800 MG tablet Take 1 tablet (800 mg total) by mouth every 8 (eight) hours as needed for moderate pain. 03/13/19 06/11/19  Milinda Pointer, MD  losartan-hydrochlorothiazide (HYZAAR) 50-12.5 MG tablet Take 1 tablet by mouth daily. 07/06/18   Poulose, Bethel Born, NP  medroxyPROGESTERone Acetate 150 MG/ML SUSY INJECT INTO THE MUSCLE AS DIRECTED. 10/23/18   [provider]  metoprolol succinate (TOPROL-XL) 100 MG 24 hr tablet Take 100 mg by mouth daily. Take with or immediately following a meal.    [provider]  pregabalin (LYRICA) 50 MG capsule Take 1 cap (50 mg) PO BID x 7 days, then 1 cap PO TID x 7 days, then take 2 caps (100 mg) PO BID and stay on this dose. 03/13/19   Milinda Pointer, MD  STIOLTO RESPIMAT 2.5-2.5 MCG/ACT AERS Inhale 2 puffs into the lungs daily. 07/06/18  Poulose, Bethel Born, NP  traMADol (ULTRAM) 50 MG tablet Take 1 tablet (50 mg total) by mouth every 6 (six) hours as needed. 05/31/19 05/30/20  Gregor Hams, MD    Allergies Nucynta [tapentadol] and Tape  Family History  Problem Relation Age of Onset   Diabetes Sister    Migraines Maternal Aunt    Throat cancer Maternal Aunt    Migraines Maternal Uncle    Migraines Paternal Aunt    Migraines Paternal Uncle    Diabetes Sister    Stomach cancer Sister    Diabetes Sister    Diabetes Sister    Diabetes Sister    Diabetes Sister    Ovarian cancer Cousin    Diabetes Mother    Diabetes Father    Migraines Father    Hypertension Brother    Coronary artery disease Brother    Hypertension Brother    Diabetes Brother    Breast cancer Neg Hx     Social  History Social History   Tobacco Use   Smoking status: Never Smoker   Smokeless tobacco: Never Used  Substance Use Topics   Alcohol use: No    Comment: occ.   Drug use: No    Comment: last used several months ago    Review of Systems Constitutional: No fever/chills Eyes: No visual changes. ENT: No sore throat. Cardiovascular: Denies chest pain. Respiratory: Denies shortness of breath. Gastrointestinal: No abdominal pain.  No nausea, no vomiting.  No diarrhea.  No constipation. Genitourinary: Negative for dysuria. Musculoskeletal: Negative for neck pain.  Negative for back pain.  Positive for left lower extremity pain Integumentary: Negative for rash. Neurological: Negative for headaches, focal weakness or numbness.   ____________________________________________   PHYSICAL EXAM:  VITAL SIGNS: ED Triage Vitals  Enc Vitals Group     BP 05/30/19 2340 (!) 166/79     Pulse Rate 05/30/19 2340 98     Resp 05/30/19 2340 20     Temp 05/30/19 2340 98.4 F (36.9 C)     Temp Source 05/30/19 2340 Oral     SpO2 05/30/19 2340 99 %     Weight 05/30/19 2340 (!) 145.6 kg (321 lb)     Height 05/30/19 2340 1.626 m (5\' 4" )     Head Circumference --      Peak Flow --      Pain Score 05/30/19 2339 10     Pain Loc --      Pain Edu? --      Excl. in [place]? --     Constitutional: Alert and oriented.  Eyes: Conjunctivae are normal.  Mouth/Throat: Mucous membranes are moist. Neck: No stridor.  No meningeal signs.   Cardiovascular: Normal rate, regular rhythm. Good peripheral circulation. Grossly normal heart sounds. Respiratory: Normal respiratory effort.  No retractions. Gastrointestinal: Soft and nontender. No distention.  Musculoskeletal: No lower extremity tenderness nor edema. No gross deformities of extremities. Neurologic:  Normal speech and language. No gross focal neurologic deficits are appreciated.  Skin:  Skin is warm, dry and intact. Psychiatric: Mood and affect are  normal. Speech and behavior are normal.    RADIOLOGY I, [place], personally viewed and evaluated these images (plain radiographs) as part of my medical decision making, as well as reviewing the written report by the radiologist.  ED MD interpretation: No evidence of DVT noted on ultrasound  Official radiology report(s): US Venous Img Lower Unilateral Left  Result Date: 05/31/2019 CLINICAL DATA:  Left lower extremity pain  and swelling for 2 weeks EXAM: LEFT LOWER EXTREMITY VENOUS DOPPLER ULTRASOUND TECHNIQUE: Gray-scale sonography with graded compression, as well as color Doppler and duplex ultrasound were performed to evaluate the lower extremity deep venous systems from the level of the common femoral vein and including the common femoral, femoral, profunda femoral, popliteal and calf veins including the posterior tibial, peroneal and gastrocnemius veins when visible. The superficial great saphenous vein was also interrogated. Spectral Doppler was utilized to evaluate flow at rest and with distal augmentation maneuvers in the common femoral, femoral and popliteal veins. COMPARISON:  None. FINDINGS: Contralateral Common Femoral Vein: Respiratory phasicity is normal and symmetric with the symptomatic side. No evidence of thrombus. Normal compressibility. Common Femoral Vein: No evidence of thrombus. Normal compressibility, respiratory phasicity and response to augmentation. Saphenofemoral Junction: No evidence of thrombus. Normal compressibility and flow on color Doppler imaging. Profunda Femoral Vein: No evidence of thrombus. Normal compressibility and flow on color Doppler imaging. Femoral Vein: No evidence of thrombus. Normal compressibility, respiratory phasicity and response to augmentation. Popliteal Vein: No evidence of thrombus. Normal compressibility, respiratory phasicity and response to augmentation. Calf Veins: No evidence of thrombus. Normal compressibility and flow on color Doppler  imaging. Superficial Great Saphenous Vein: No evidence of thrombus. Normal compressibility. Venous Reflux:  None. Other Findings:  None. IMPRESSION: No evidence of deep venous thrombosis. Electronically Signed   By: Inez Catalina M.D.   On: 05/31/2019 00:21      Procedures   ____________________________________________   INITIAL IMPRESSION / MDM / [place] / ED COURSE  As part of my medical decision making, I reviewed the following data within the electronic MEDICAL RECORD NUMBER 43 year old female presented with above-stated history and physical exam secondary to nontraumatic left calf pain.  Considered possibly DVT however ultrasound revealed no evidence of DVT.  Given considerable pain with palpation of the left calf MRI of the calf was performed with findings consistent with muscular strain per radiologist.  ____________________________________________  FINAL CLINICAL IMPRESSION(S) / ED DIAGNOSES  Final diagnoses:  Strain of calf muscle, left, initial encounter     MEDICATIONS GIVEN DURING THIS VISIT:  Medications  oxyCODONE-acetaminophen (PERCOCET/ROXICET) 5-325 MG per tablet 1 tablet (1 tablet Oral Given 05/31/19 0247)  oxyCODONE-acetaminophen (PERCOCET/ROXICET) 5-325 MG per tablet 1 tablet (1 tablet Oral Given 05/31/19 0551)     ED Discharge Orders         Ordered    traMADol (ULTRAM) 50 MG tablet  Every 6 hours PRN     05/31/19 0547          *Please note:  Lauren [name] was evaluated in Emergency Department on 05/31/2019 for the symptoms described in the history of present illness. She was evaluated in the context of the global COVID-19 pandemic, which necessitated consideration that the patient might be at risk for infection with the SARS-CoV-2 virus that causes COVID-19. Institutional protocols and algorithms that pertain to the evaluation of patients at risk for COVID-19 are in a state of rapid change based on information released by regulatory bodies  including the CDC and federal and state organizations. These policies and algorithms were followed during the patient's care in the ED.  Some ED evaluations and interventions may be delayed as a result of limited staffing during the pandemic.*  Note:  This document was prepared using Dragon voice recognition software and may include unintentional dictation errors.   Gregor Hams, MD 05/31/19 [phone]

## 2019-05-31 NOTE — Progress Notes (Signed)
Patient states she had to go to ER last night for the pain. Did MRI and ultrasound to check for a blood clot.

## 2019-05-31 NOTE — ED Notes (Signed)
Pt has strong pedal pulses in affected leg.

## 2019-06-02 NOTE — Progress Notes (Signed)
Pain Management Virtual Encounter Note - Virtual Visit via Telephone Telehealth (real-time audio visits between healthcare provider and patient).   Patient's Phone No. & Preferred Pharmacy:  [phone] (home); [phone] (mobile); (Preferred) [phone] [email or web address]  [facility] [address]) [place] [address] Phone: [phone] Fax: [phone]    Pre-screening note:  Our staff contacted Lauren [name] and offered her an "in person", "face-to-face" appointment versus a telephone encounter. She indicated preferring the telephone encounter, at this time.   Reason for Virtual Visit: COVID-19*  Social distancing based on CDC and AMA recommendations.   I contacted Lauren [name] on 06/03/2019 via telephone.      I clearly identified myself as Gaspar Cola, MD. I verified that I was speaking with the correct person using two identifiers (Name: Lauren [name], and date of birth: 1976-05-11).  Advanced Informed Consent I sought verbal advanced consent from Lauren [name] for virtual visit interactions. I informed Lauren [name] of possible security and privacy concerns, risks, and limitations associated with providing "not-in-person" medical evaluation and management services. I also informed Lauren [name] of the availability of "in-person" appointments. Finally, I informed her that there would be a charge for the virtual visit and that she could be  personally, fully or partially, financially responsible for it. Lauren [name] expressed understanding and agreed to proceed.   Historic Elements   Lauren [name] is a 43 y.o. year old, female patient evaluated today after her last encounter by our practice on 05/30/2019. Lauren [name]  has a past medical history of Advanced maternal age in multigravida, Asthma, Depression, GERD (gastroesophageal reflux disease), Grand multiparity, Headache,  History of carpal tunnel surgery of left wrist (05/2018), Hypertension, Leg fracture, Obesity, morbid, BMI 40.0-49.9 ([place]), Post traumatic stress disorder, Pre-diabetes, Pre-eclampsia, SVT (supraventricular tachycardia) ([place]), and Wrist fracture, closed. She also  has a past surgical history that includes Rotator cuff repair; Knee surgery; Cholecystectomy; Cesarean section (2010); Cesarean section (2011); Tonsillectomy; Cardiac electrophysiology study and ablation (01/18/2018); and Excision mass neck (Left, 10/11/2018). Lauren [name] has a current medication list which includes the following prescription(s): albuterol, aspirin ec, beclomethasone, esomeprazole, fluticasone, furosemide, hydrocodone-acetaminophen, hydrocodone-acetaminophen, hydrocodone-acetaminophen, ibuprofen, losartan-hydrochlorothiazide, medroxyprogesterone acetate, metoprolol succinate, pregabalin, and stiolto respimat. She  reports that she has never smoked. She has never used smokeless tobacco. She reports that she does not drink alcohol or use drugs. Lauren [name] is allergic to nucynta [tapentadol] and tape.   HPI  Today, she is being contacted for medication management.  The patient indicates doing well with the current medication regimen. No adverse reactions or side effects reported to the medications.  The patient indicates recently having been to the ED secondary to left lower extremity pain.  An ultrasound was done to evaluate for DVTs and found to be negative.  However, an MRI did reveal evidence of a partial tear of the medial head of the left gastrocnemius muscle distally.  No orthopedic surgery consult was obtained at that time.  Today we will go ahead and arrange for a referral to orthopedic surgery for evaluation and treatment of this condition.  She is pending to return this Thursday for a therapeutic bilateral intra-articular Hyalgan knee injection #4.  She indicates that it has worked extremely well and needed allowed her to  get off of the cane.  However, some of the pain is beginning to come back and she is looking forward to getting the fourth injection.  In addition to this, she indicates having a flareup of her left shoulder pain.  I have just reviewed the available imaging on the left shoulder, and x-rays of the clavicle and the shoulder itself were noncontributory.  She continues to have pain in that area and therefore today I will be ordering an MRI to see if there is pathology that may be correctable.  I have requested orthopedic surgery to evaluate not only her partial gastrocnemius tear, but also both of her shoulders.  Since the patient is coming in on Thursday for the knee injection, I will request approval for a left shoulder injection at the same time.  With regards to the patient's medication, she has indicated that she was able to get off of the gabapentin, since she was not getting any benefits out of it.  However, when she was started on the Lyrica, she failed to tell me that she had been off of it for an extended period of time and when I started her at a high dose, she did get some side effects as a consequence of the dose of the regimen.  Today we will go back to a lower dose and we will started slowly allowing her to get used to the medication and hopefully later on being able to increase it as tolerated to a point where it is helping her or we reached a maximum of 450 mg/day.  Pharmacotherapy Assessment  Analgesic: Hydrocodone/APAP 5/325, 1 tab PO q 8 hrs (15 mg/day of hydrocodone) (has enough until 06/11/2019) MME/day: 15 mg/day.   Monitoring: Pharmacotherapy: No side-effects or adverse reactions reported. [place] PMP: PDMP not reviewed this encounter.       Compliance: No problems identified. Effectiveness: Clinically acceptable. Plan: Refer to "POC".  UDS:  Summary  Date Value Ref Range Status  09/13/2018 FINAL  Final    Comment:     ==================================================================== TOXASSURE SELECT 13 (MW) ==================================================================== Test                             Result       Flag       Units Drug Present and Declared for Prescription Verification   Hydrocodone                    324          EXPECTED   ng/mg creat   Hydromorphone                  78           EXPECTED   ng/mg creat   Dihydrocodeine                 176          EXPECTED   ng/mg creat   Norhydrocodone                 493          EXPECTED   ng/mg creat    Sources of hydrocodone include scheduled prescription    medications. Hydromorphone, dihydrocodeine and norhydrocodone are    expected metabolites of hydrocodone. Hydromorphone and    dihydrocodeine are also available as scheduled prescription    medications. ==================================================================== Test                      Result    Flag   Units      Ref Range  Creatinine              190              mg/dL      >=20 ==================================================================== Declared Medications:  The flagging and interpretation on this report are based on the  following declared medications.  Unexpected results may arise from  inaccuracies in the declared medications.  **Note: The testing scope of this panel includes these medications:  Hydrocodone (Norco)  Hydrocodone (Vicodin)  **Note: The testing scope of this panel does not include following  reported medications:  Acetaminophen (Norco)  Acetaminophen (Tylenol)  Acetaminophen (Vicodin)  Albuterol  Aspirin (Aspirin 81)  Beclomethasone (QVAR)  Diltiazem (Cardizem)  Fluticasone (Flovent)  Furosemide (Lasix)  Gabapentin  Hydrochlorothiazide (Hyzaar)  Ibuprofen (Advil)  Losartan (Hyzaar)  Metformin  Metoprolol (Toprol)  Olodaterol (Stiolto)  Omeprazole (Nexium)  Tiotropium (Stiolto)  Tizanidine  (Zanaflex) ==================================================================== For clinical consultation, please call [phone]. ====================================================================    Laboratory Chemistry Profile (12 mo)  Renal: 08/02/2018: BUN 9; BUN/Creatinine Ratio NOT APPLICABLE; Creat [id number]  Lab Results  Component Value Date   GFRAA 110 08/02/2018   GFRNONAA 95 08/02/2018   Hepatic: 06/25/2018: Albumin 3.9 Lab Results  Component Value Date   AST 21 07/12/2018   ALT 25 07/12/2018   Other: 08/16/2018: CRP 46; Sed Rate 111 Note: Above Lab results reviewed.  Imaging  Last 90 days:  Mr Tibia Fibula Left Wo Contrast  Result Date: 05/31/2019 CLINICAL DATA:  Progressive left calf pain and swelling over the last 10 days. No acute injury. Recent gastric bypass surgery. Lower extremity Doppler ultrasound negative for DVT. EXAM: MRI OF LOWER LEFT EXTREMITY WITHOUT CONTRAST TECHNIQUE: Multiplanar, multisequence MR imaging of the left lower leg was performed. No intravenous contrast was administered. COMPARISON:  Limited correlation made with left knee MRI 10/06/2018. FINDINGS: Bones/Joint/Cartilage No evidence of acute fracture, dislocation, bone destruction or cortical thickening. Left knee degenerative changes, primarily in the lateral compartment, are grossly stable. No significant joint effusions. Ligaments Not relevant for exam/indication. Muscles and Tendons There is edema within the medial head of the gastrocnemius muscle distally. In addition, there is mild overlying subcutaneous edema. No tendon rupture or retraction identified. The Achilles tendon appears normal. The visualized components of the additional ankle tendons appear normal. Soft tissues As above, asymmetric subcutaneous edema on the left surrounding the medial head of the gastrocnemius muscle distally. Minimal pretibial subcutaneous edema appears similar to the opposite side. No focal fluid collection.  IMPRESSION: 1. Findings are most consistent with a strain/partial tear of the medial head of the left gastrocnemius muscle distally. No tendon rupture or retraction identified. 2. No focal fluid collection. 3. No significant osseous findings. Electronically Signed   By: Richardean Sale M.D.   On: 05/31/2019 08:00   US Venous Img Lower Unilateral Left  Result Date: 05/31/2019 CLINICAL DATA:  Left lower extremity pain and swelling for 2 weeks EXAM: LEFT LOWER EXTREMITY VENOUS DOPPLER ULTRASOUND TECHNIQUE: Gray-scale sonography with graded compression, as well as color Doppler and duplex ultrasound were performed to evaluate the lower extremity deep venous systems from the level of the common femoral vein and including the common femoral, femoral, profunda femoral, popliteal and calf veins including the posterior tibial, peroneal and gastrocnemius veins when visible. The superficial great saphenous vein was also interrogated. Spectral Doppler was utilized to evaluate flow at rest and with distal augmentation maneuvers in the common femoral, femoral and popliteal veins. COMPARISON:  None. FINDINGS: Contralateral Common Femoral Vein:  Respiratory phasicity is normal and symmetric with the symptomatic side. No evidence of thrombus. Normal compressibility. Common Femoral Vein: No evidence of thrombus. Normal compressibility, respiratory phasicity and response to augmentation. Saphenofemoral Junction: No evidence of thrombus. Normal compressibility and flow on color Doppler imaging. Profunda Femoral Vein: No evidence of thrombus. Normal compressibility and flow on color Doppler imaging. Femoral Vein: No evidence of thrombus. Normal compressibility, respiratory phasicity and response to augmentation. Popliteal Vein: No evidence of thrombus. Normal compressibility, respiratory phasicity and response to augmentation. Calf Veins: No evidence of thrombus. Normal compressibility and flow on color Doppler imaging. Superficial  Great Saphenous Vein: No evidence of thrombus. Normal compressibility. Venous Reflux:  None. Other Findings:  None. IMPRESSION: No evidence of deep venous thrombosis. Electronically Signed   By: Inez Catalina M.D.   On: 05/31/2019 00:21   Dg [facility] Clinic C-arm 1-60 Min No Report  Result Date: 04/02/2019 Fluoro was used, but no Radiologist interpretation will be provided. Please refer to "NOTES" tab for provider progress note.  Dg Pain Clinic C-arm 1-60 Min No Report  Result Date: 03/19/2019 Fluoro was used, but no Radiologist interpretation will be provided. Please refer to "NOTES" tab for provider progress note.   Assessment  The primary encounter diagnosis was Acute shoulder pain (Left). Diagnoses of Rotator cuff tendinitis (Left), Chronic shoulder pain (Left), Osteoarthritis of shoulder (Left), Chronic knee pain (Primary Area of Pain) (Bilateral) (L>R), Chronic pain syndrome, Neurogenic pain, Fibromyalgia, Osteoarthritis involving multiple joints, Pharmacologic therapy, Disorder of skeletal system, Problems influencing health status, Chronic shoulder pain (Fourth Area of Pain) (Bilateral) (L>R), and Gastrocnemius muscle tear, left, initial encounter were also pertinent to this visit.  Plan of Care  I have discontinued Lauren [name]'s HYDROcodone-acetaminophen, HYDROcodone-acetaminophen, and traMADol. I have also changed her pregabalin and HYDROcodone-acetaminophen. Additionally, I am having her start on HYDROcodone-acetaminophen and HYDROcodone-acetaminophen. Lastly, I am having her maintain her aspirin EC, furosemide, metoprolol succinate, losartan-hydrochlorothiazide, Stiolto Respimat, esomeprazole, albuterol, beclomethasone, fluticasone, medroxyPROGESTERone Acetate, and ibuprofen.  Pharmacotherapy (Medications Ordered): Meds ordered this encounter  Medications  . pregabalin (LYRICA) 25 MG capsule    Sig: Take 1 capsule (25 mg total) by mouth 2 (two) times daily for 15 days, THEN 1 capsule  (25 mg total) 3 (three) times daily for 15 days.    Dispense:  75 capsule    Refill:  0    Med titration/taper: Prescriptions follow a specific schedule. Avoid dispensing out of order. Fill one day early if pharmacy is closed on scheduled refill date. May substitute for generic if available.  Marland Kitchen ibuprofen (ADVIL) 800 MG tablet    Sig: Take 1 tablet (800 mg total) by mouth every 8 (eight) hours as needed for moderate pain.    Dispense:  90 tablet    Refill:  2    Fill one day early if pharmacy is closed on scheduled refill date. May substitute for generic if available.  Marland Kitchen HYDROcodone-acetaminophen (NORCO/VICODIN) 5-325 MG tablet    Sig: Take 1 tablet by mouth every 8 (eight) hours as needed for severe pain. Must last 30 days    Dispense:  90 tablet    Refill:  0    Chronic Pain: STOP Act (Not applicable) Fill 1 day early if closed on refill date. Do not fill until: 06/11/2019. To last until: 07/11/2019. Avoid benzodiazepines within 8 hours of opioids  . HYDROcodone-acetaminophen (NORCO/VICODIN) 5-325 MG tablet    Sig: Take 1 tablet by mouth every 8 (eight) hours as needed for severe pain.  Must last 30 days    Dispense:  90 tablet    Refill:  0    Chronic Pain: STOP Act (Not applicable) Fill 1 day early if closed on refill date. Do not fill until: 07/11/2019. To last until: 08/10/2019. Avoid benzodiazepines within 8 hours of opioids  . HYDROcodone-acetaminophen (NORCO/VICODIN) 5-325 MG tablet    Sig: Take 1 tablet by mouth every 8 (eight) hours as needed for severe pain. Must last 30 days    Dispense:  90 tablet    Refill:  0    Chronic Pain: STOP Act (Not applicable) Fill 1 day early if closed on refill date. Do not fill until: 08/10/2019. To last until: 09/09/2019. Avoid benzodiazepines within 8 hours of opioids   Orders:  Orders Placed This Encounter  Procedures  . MR SHOULDER LEFT WO CONTRAST    Standing Status:   Future    Standing Expiration Date:   08/02/2020    Order Specific  Question:   What is the patient's sedation requirement?    Answer:   No Sedation    Order Specific Question:   Does the patient have a pacemaker or implanted devices?    Answer:   Yes    Order Specific Question:   Preferred imaging location?    Answer:   [facility] (table limit-550lbs)    Order Specific Question:   Call Results- Best Contact Number?    Answer:   [phone]    Order Specific Question:   Radiology Contrast Protocol - do NOT remove file path    Answer:   [email or web address]  . ToxASSURE Select 13 (MW), Urine    Volume: 30 ml(s). Minimum 3 ml of urine is needed. Document temperature of fresh sample. Indications: Long term (current) use of opiate analgesic (Z79.891)  . Comp. Metabolic Panel (12)    With GFR. Indications: Chronic Pain Syndrome (G89.4) & Pharmacotherapy [id number])    Order Specific Question:   Has the patient fasted?    Answer:   No    Order Specific Question:   CC Results    Answer:   PCP-NURSE [id number]  . Magnesium    Indication: Pharmacologic therapy [id number]    Order Specific Question:   CC Results    Answer:   PCP-NURSE [id number]  . Vitamin B12    Indication: Pharmacologic therapy [id number]).    Order Specific Question:   CC Results    Answer:   PCP-NURSE [id number]  . Sedimentation rate    Indication: Disorder of skeletal system (M89.9)    Order Specific Question:   CC Results    Answer:   PCP-NURSE [id number]  . 25-Hydroxyvitamin D Lcms D2+D3    Indication: Disorder of skeletal system (M89.9).    Order Specific Question:   CC Results    Answer:   PCP-NURSE [id number]  . C-reactive protein    Indication: Problems influencing health status (Z78.9)    Order Specific Question:   CC Results    Answer:   PCP-NURSE [id number]  . Ambulatory referral to Orthopedic Surgery    Referral Priority:   Routine    Referral Type:   Surgical    Referral Reason:   Specialty Services Required    Requested Specialty:   Orthopedic  Surgery    Number of Visits Requested:   1   Follow-up plan:   Return for Procedure (w/ sedation): (B) Hyalgan #4 + (L) Shoulder inj. #3.      Considering: NOTE:NO Lumbar RFAuntil BMIis at  or below 35. Diagnostic bilateral knee genicular nerve block #2 Diagnostic left intra-articular wrist injection (MNB/TPI) #2 Diagnostic bilateral cervical facet nerve block Diagnostic bilateral cervical ESI Possible bilateral cervical facet RFA Diagnostic bilateral intra-articular shoulder injection #3 Diagnostic bilateral suprascapular nerve block Possible bilateral suprascapular nerve RFA Diagnostic bilateral LESI Diagnostic right lumbar facet nerve block #2 Possible bilateral lumbar facet RFA(NO Lumbar RFAuntil BMIis at or below 35.)   Palliative PRN treatment(s): Palliative bilateral intra-articular Hyalgan knee injection S2N3under fluoroscopic guidance Palliative bilateral intra-articular knee joint (steroid) #2  Therapeutic left-sided L4-5 LESI #2       Recent Visits Date Type Provider Dept  04/02/19 Procedure visit Milinda Pointer, MD [facility]  03/19/19 Procedure visit Milinda Pointer, MD [facility]  03/06/19 Office Visit Milinda Pointer, MD [facility]  Showing recent visits within past 90 days and meeting all other requirements   Today's Visits Date Type Provider Dept  06/03/19 Office Visit Milinda Pointer, MD [facility]  Showing today's visits and meeting all other requirements   Future Appointments Date Type Provider Dept  06/06/19 Appointment Milinda Pointer, MD [facility]  Showing future appointments within next 90 days and meeting all other requirements   I discussed the assessment and treatment plan with the patient. The patient was provided an opportunity to ask questions and all were answered. The patient agreed with the plan and demonstrated an understanding of the  instructions.  Patient advised to call back or seek an in-person evaluation if the symptoms or condition worsens.  Total duration of non-face-to-face encounter: 15 minutes.  Note by: Gaspar Cola, MD Date: 06/03/2019; Time: 2:20 PM  Note: This dictation was prepared with Dragon dictation. Any transcriptional errors that may result from this process are unintentional.  Disclaimer:  * Given the special circumstances of the COVID-19 pandemic, the federal government has announced that the Office for Civil Rights (OCR) will exercise its enforcement discretion and will not impose penalties on physicians using telehealth in the event of noncompliance with regulatory requirements under the [place] and [place] (HIPAA) in connection with the good faith provision of telehealth during the XX123456 national public health emergency. ([place])

## 2019-06-03 ENCOUNTER — Telehealth: Payer: Self-pay | Admitting: *Deleted

## 2019-06-03 ENCOUNTER — Other Ambulatory Visit: Payer: Self-pay

## 2019-06-03 ENCOUNTER — Ambulatory Visit: Payer: Medicaid Other | Attending: Pain Medicine | Admitting: Pain Medicine

## 2019-06-03 DIAGNOSIS — M25512 Pain in left shoulder: Secondary | ICD-10-CM | POA: Diagnosis not present

## 2019-06-03 DIAGNOSIS — M797 Fibromyalgia: Secondary | ICD-10-CM

## 2019-06-03 DIAGNOSIS — M7582 Other shoulder lesions, left shoulder: Secondary | ICD-10-CM

## 2019-06-03 DIAGNOSIS — Z789 Other specified health status: Secondary | ICD-10-CM

## 2019-06-03 DIAGNOSIS — M25561 Pain in right knee: Secondary | ICD-10-CM | POA: Diagnosis not present

## 2019-06-03 DIAGNOSIS — S86112A Strain of other muscle(s) and tendon(s) of posterior muscle group at lower leg level, left leg, initial encounter: Secondary | ICD-10-CM | POA: Insufficient documentation

## 2019-06-03 DIAGNOSIS — M25511 Pain in right shoulder: Secondary | ICD-10-CM

## 2019-06-03 DIAGNOSIS — M159 Polyosteoarthritis, unspecified: Secondary | ICD-10-CM

## 2019-06-03 DIAGNOSIS — M19012 Primary osteoarthritis, left shoulder: Secondary | ICD-10-CM | POA: Diagnosis not present

## 2019-06-03 DIAGNOSIS — M150 Primary generalized (osteo)arthritis: Secondary | ICD-10-CM

## 2019-06-03 DIAGNOSIS — M792 Neuralgia and neuritis, unspecified: Secondary | ICD-10-CM

## 2019-06-03 DIAGNOSIS — G8929 Other chronic pain: Secondary | ICD-10-CM

## 2019-06-03 DIAGNOSIS — M25562 Pain in left knee: Secondary | ICD-10-CM

## 2019-06-03 DIAGNOSIS — Z79899 Other long term (current) drug therapy: Secondary | ICD-10-CM

## 2019-06-03 DIAGNOSIS — G894 Chronic pain syndrome: Secondary | ICD-10-CM

## 2019-06-03 DIAGNOSIS — M899 Disorder of bone, unspecified: Secondary | ICD-10-CM

## 2019-06-03 MED ORDER — HYDROCODONE-ACETAMINOPHEN 5-325 MG PO TABS: 1.0000 | ORAL_TABLET | Freq: Three times a day (TID) | ORAL | 0 refills | Status: DC | PRN

## 2019-06-03 MED ORDER — IBUPROFEN 800 MG PO TABS: 800.0000 mg | ORAL_TABLET | Freq: Three times a day (TID) | ORAL | 2 refills | Status: DC | PRN

## 2019-06-03 MED ORDER — PREGABALIN 25 MG PO CAPS: ORAL_CAPSULE | ORAL | 0 refills | Status: DC

## 2019-06-03 NOTE — Patient Instructions (Addendum)

## 2019-06-06 ENCOUNTER — Ambulatory Visit: Payer: Medicaid Other | Admitting: Pain Medicine

## 2019-06-11 ENCOUNTER — Ambulatory Visit: Payer: Medicaid Other | Admitting: Orthopaedic Surgery

## 2019-06-11 ENCOUNTER — Encounter: Payer: Medicaid Other | Admitting: Certified Nurse Midwife

## 2019-06-18 ENCOUNTER — Ambulatory Visit (INDEPENDENT_AMBULATORY_CARE_PROVIDER_SITE_OTHER): Payer: Medicaid Other | Admitting: Orthopaedic Surgery

## 2019-06-18 ENCOUNTER — Other Ambulatory Visit: Payer: Self-pay

## 2019-06-18 ENCOUNTER — Encounter: Payer: Medicaid Other | Admitting: Certified Nurse Midwife

## 2019-06-18 ENCOUNTER — Encounter: Payer: Self-pay | Admitting: Orthopaedic Surgery

## 2019-06-18 VITALS — BP 164/99 | HR 90 | Ht 64.0 in | Wt 315.0 lb

## 2019-06-18 DIAGNOSIS — M5441 Lumbago with sciatica, right side: Secondary | ICD-10-CM

## 2019-06-18 DIAGNOSIS — M170 Bilateral primary osteoarthritis of knee: Secondary | ICD-10-CM | POA: Diagnosis not present

## 2019-06-18 DIAGNOSIS — G8929 Other chronic pain: Secondary | ICD-10-CM | POA: Diagnosis not present

## 2019-06-18 DIAGNOSIS — M5442 Lumbago with sciatica, left side: Secondary | ICD-10-CM | POA: Diagnosis not present

## 2019-06-18 NOTE — Progress Notes (Signed)
Office Visit Note   Patient: Lauren [name]           Date of Birth: June 08, 1976           MRN: [id number] Visit Date: 06/18/2019              Requested by: Milinda Pointer, [address] [place],  [place] [address] PCP: Lorelee Market, MD   Assessment & Plan: Visit Diagnoses:  1. 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 Bilateral primary osteoarthritis of knee     Plan: Chronic bilateral pain with MRI scans consistent with degenerative arthritis.  There are more changes on the left than the right knee which correlates with her symptoms.  Today she has had cortisone and Visco supplementation and exercises.  She is also had gastric bypass within the past several months.  Long discussion over 45 minutes regarding all of the above.  I really do not have anything else to offer her at this point other than weight loss and exercises.  Her present BMI is over 50.  She needs to lose about 100 pounds before she would be a candidate for knee replacement.  She will continue to be followed by her primary care physician and pain clinic physician in [place].  She has had excellent care to date.  No new medicines.  She also had an MRI scan of the lumbar spine in 2015 that was essentially negative.  I suspect that she does have musculoligamentous pain.  She will continue with her exercises.  She also had a recent MRI scan of her lower left leg with a Doppler study that were negative for any acute changes other than a strain or partial tear of the medial head of the last gastrocnemius muscle distally.  No tendon rupture or retraction identified.  This should resolve on its own without any specific treatment She does have an MRI scan scheduled for left shoulder.  Films performed at her primary care physician's office were apparently negative.  I do not have those for review I would be more than happy to see her after the study if there is any significant  pathology.  Otherwise we will plan to see her back as needed. Office visit over 45 minutes 50% of the time in counseling.  Follow-Up Instructions: Return if symptoms worsen or fail to improve.   Orders:  No orders of the defined types were placed in this encounter.  No orders of the defined types were placed in this encounter.     Procedures: No procedures performed   Clinical Data: No additional findings.   Subjective: Chief Complaint  Patient presents with   Right Shoulder - Pain   Left Shoulder - Pain   Right Knee - Pain   Left Knee - Pain  Patient presents today with multiple complaints. Her shoulders have been hurting for 6 months. The left side is worse than the right. She said that her shoulder hurts all throughout and constantly, but worse with movement. She has limited range of motion. She has pain that radiates down just her left arm. She had a tumor removed on the anterior side of her shoulder area earlier this year and states that it has caused more pain. She has difficulty sleeping at night. She takes hydrocodone as prescribed by her pain doctor, as well as OTC pain medicine. She is right hand dominant. She has a history of rotator cuff surgery on the right  side. She has numbness in her left elbow and into hr hand. She had carpal tunnel surgery at the end of 2019 and states that she still has pain. She states that her knees have been hurting for over 10years. They have worsened over times and sometimes cannot walk without a cane. The left side is worse than the right. Her pain is constant in her knees. She has a difficult time switching positions from sitting to standing, or vice versus. She has swelling, clicking, and grinding in both knees. She has been told she needs bilateral knee replacements. She was told that she has a tear in her calf on the left side three weeks ago when she went to [facility] ED.  She has a history of left meniscus surgery around 7 years ago.   I  reviewed the notes referred to me from Dr. Dossie Arbour.  She has had exemplary care to date in reference to her knees with cortisone, Visco supplementation.  She recently was experiencing pain in the distal aspect of her left leg.  She has had an MRI scan of that area that was negative for any acute changes other than a tear of the medial gastroc without retraction.  Doppler study was negative for DVT.  She does have some neurologic changes in both of her feet from her diabetes. She had a gastric bypass approximately 3 months ago and has lost 40 pounds  HPI  Review of Systems  Constitutional: Positive for fatigue.  HENT: Positive for ear pain.   Eyes: Negative for pain.  Respiratory: Negative for shortness of breath.   Cardiovascular: Positive for leg swelling.  Gastrointestinal: Negative for constipation and diarrhea.  Endocrine: Negative for cold intolerance and heat intolerance.  Genitourinary: Negative for difficulty urinating.  Musculoskeletal: Positive for joint swelling.  Skin: Negative for rash.  Allergic/Immunologic: Negative for food allergies.  Neurological: Positive for weakness.  Hematological: Does not bruise/bleed easily.  Psychiatric/Behavioral: Positive for sleep disturbance.     Objective: Vital Signs: BP (!) 164/99    Pulse 90    Ht 5\' 4"  (1.626 m)    Wt (!) 315 lb (142.9 kg)    BMI 54.07 kg/m   Physical Exam Constitutional:      Appearance: She is well-developed.  Eyes:     Pupils: Pupils are equal, round, and reactive to light.  Pulmonary:     Effort: Pulmonary effort is normal.  Skin:    General: Skin is warm and dry.  Neurological:     Mental Status: She is alert and oriented to person, place, and time.  Psychiatric:        Behavior: Behavior normal.     Ortho Exam awake alert and oriented x3.  Comfortable sitting.  Pleasant.  Large legs full extension about 90 degrees of flexion and when her thigh would touch her calf.  No instability.  Has a little bit  more lateral than medial joint pain bilaterally and greater on the left.  Difficult to ascertain if there is an effusion.  No instability.  Does have some mild tenderness medially and distally in the left leg.  Skin intact.  Feet were warm some altered sensibility related to her diabetic neuropathy.  Painless range of motion both hips.  Straight leg raise negative.  No percussible tenderness of the lumbar spine.  Positive impingement with motion of her left shoulder.  I thought she lacked a few degrees to full overhead motion but could just be related to pain.  No  grating or crepitation. Specialty Comments:  No specialty comments available.  Imaging: No results found.   PMFS History: Patient Active Problem List   Diagnosis Date Noted   Osteoarthritis of shoulder (Left) 06/03/2019   Gastrocnemius muscle tear, left, initial encounter 06/03/2019   S/P gastric bypass 04/04/2019   Spondylosis without myelopathy or radiculopathy, lumbosacral region 03/19/2019   BMI 50.0-59.9, adult ([place]) 03/11/2019   Osteoarthritis involving multiple joints 03/06/2019   Lumbar facet syndrome (Bilateral) (R>L) 03/06/2019   Rotator cuff tendinitis, left 02/25/2019   Rotator cuff tendinitis, right 02/25/2019   Chronic shoulder pain (Left) 12/26/2018   Lipoma of neck (Left) 12/26/2018   Acute shoulder pain (Left) 12/26/2018   Chronic forearm pain (Left) 09/27/2018   Osteoarthritis of shoulder (Bilateral) 08/30/2018   Chronic knee pain (Primary Area of Pain) (Left) 08/15/2018   Lipoma of shoulder (Left) 08/15/2018   Lumbar spondylosis 08/13/2018   Iron deficiency anemia 07/18/2018   Thrombocytosis ([place]) 07/13/2018   Leukocytosis 07/13/2018   Anemia 07/13/2018   Prediabetes 07/13/2018   Asthma 07/12/2018   Lymphadenopathy, cervical 07/12/2018   Morbid obesity with BMI of 50.0-59.9, adult ([place]) 07/09/2018   Neurogenic pain 07/09/2018   Impaired ambulation 07/09/2018   DDD  (degenerative disc disease), lumbar 05/15/2018   Bilateral primary osteoarthritis of knee 04/12/2018   Cervicalgia 04/12/2018   Chronic low back pain (Fifth Area of Pain) (Bilateral) (R>L) w/o sciatica 04/12/2018   Elevated C-reactive protein (CRP) 04/04/2018   Elevated sed rate 04/04/2018   Chronic musculoskeletal pain 04/04/2018   Chronic wrist pain (Secondary Area of Pain) (Left) 03/14/2018   Chronic shoulder pain (Fourth Area of Pain) (Bilateral) (L>R) 03/14/2018   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 (Bilateral) 03/14/2018   Chronic upper extremity pain (Left) 03/14/2018   Chronic neck pain (Tertiary Area of Pain) (Bilateral) (L>R) 03/14/2018   Chronic lower extremity pain (Bilateral) 03/14/2018   Chronic sacroiliac joint pain 03/14/2018   Chronic pain syndrome 03/14/2018   Opiate use 03/14/2018   Pharmacologic therapy 03/14/2018   Disorder of skeletal system 03/14/2018   Problems influencing health status 03/14/2018   History of maternal deep vein thrombosis (DVT) 09/25/2017   Transaminitis 08/17/2017   Hx of abnormal cervical Pap smear 05/31/2017   SVT (supraventricular tachycardia) ([place]) 10/15/2016   Chest pain 09/23/2016   Depression 12/01/2014   Fibromyalgia 12/01/2014   Gastro-esophageal reflux disease without esophagitis 12/01/2014   PTSD (post-traumatic stress disorder) 12/01/2014   Stress incontinence 12/01/2014   Chronic pain in right shoulder 11/13/2014   Chronic knee pain (Primary Area of Pain) (Bilateral) (L>R) 07/18/2014   Chronic, continuous use of opioids 07/18/2014   HTN (hypertension), benign 07/11/2014   Morbid obesity ([place]) 05/22/2014   Chronic daily headache 05/22/2014   Chronic generalized pain 05/22/2014   Heart palpitations 05/22/2014   Snoring 05/22/2014   Past Medical History:  Diagnosis Date   Advanced maternal age in multigravida    Asthma    Depression    GERD (gastroesophageal reflux  disease)    [place] multiparity    Headache    History of carpal tunnel surgery of left wrist 05/2018   Hypertension    Leg fracture    Obesity, morbid, BMI 40.0-49.9 ([place])    Post traumatic stress disorder    Pre-diabetes    Pre-eclampsia    SVT (supraventricular tachycardia) ([place])    a. prior report of SVT req adenosine;  b. 2011 Holter @ [facility] reportedly showed "irregular heartbeat"; c. 05/2016 Holter: "basically  unremarkable" per Dr. Clayborn Bigness;  d. 07/2016 Echo: EF 50-55%. Nl RV fxn.   Wrist fracture, closed    bilateral    Family History  Problem Relation Age of Onset   Diabetes Sister    Migraines Maternal Aunt    Throat cancer Maternal Aunt    Migraines Maternal Uncle    Migraines Paternal Aunt    Migraines Paternal Uncle    Diabetes Sister    Stomach cancer Sister    Diabetes Sister    Diabetes Sister    Diabetes Sister    Diabetes Sister    Ovarian cancer Cousin    Diabetes Mother    Diabetes Father    Migraines Father    Hypertension Brother    Coronary artery disease Brother    Hypertension Brother    Diabetes Brother    Breast cancer Neg Hx     Past Surgical History:  Procedure Laterality Date   CARDIAC ELECTROPHYSIOLOGY STUDY AND ABLATION  01/18/2018   CESAREAN SECTION  2010   CESAREAN SECTION  2011   CHOLECYSTECTOMY     EXCISION MASS NECK Left 10/11/2018   Procedure: REMOVAL OF LEFT NECK LUMP;  Surgeon: Benjamine Sprague, DO;  Location: [facility] ORS;  Service: General;  Laterality: Left;   KNEE SURGERY     ROTATOR CUFF REPAIR     TONSILLECTOMY     Social History   Occupational History   Not on file  Tobacco Use   Smoking status: Never Smoker   Smokeless tobacco: Never Used  Substance and Sexual Activity   Alcohol use: No    Comment: occ.   Drug use: No    Comment: last used several months ago   Sexual activity: Yes    Partners: Male

## 2019-06-25 ENCOUNTER — Encounter: Payer: Medicaid Other | Admitting: Certified Nurse Midwife

## 2019-06-25 ENCOUNTER — Encounter: Payer: Self-pay | Admitting: Pain Medicine

## 2019-06-25 ENCOUNTER — Ambulatory Visit (HOSPITAL_BASED_OUTPATIENT_CLINIC_OR_DEPARTMENT_OTHER): Payer: Medicaid Other | Admitting: Pain Medicine

## 2019-06-25 ENCOUNTER — Other Ambulatory Visit: Payer: Self-pay

## 2019-06-25 ENCOUNTER — Ambulatory Visit
Admission: RE | Admit: 2019-06-25 | Discharge: 2019-06-25 | Disposition: A | Discharge status: Home / Self Care | Payer: Medicaid Other | Source: Ambulatory Visit | Attending: Pain Medicine | Admitting: Pain Medicine

## 2019-06-25 VITALS — BP 130/64 | HR 88 | Temp 97.2°F | Resp 18 | Ht 64.0 in | Wt 309.0 lb

## 2019-06-25 DIAGNOSIS — M25562 Pain in left knee: Secondary | ICD-10-CM | POA: Diagnosis present

## 2019-06-25 DIAGNOSIS — M25561 Pain in right knee: Secondary | ICD-10-CM

## 2019-06-25 DIAGNOSIS — M25511 Pain in right shoulder: Secondary | ICD-10-CM | POA: Insufficient documentation

## 2019-06-25 DIAGNOSIS — M170 Bilateral primary osteoarthritis of knee: Secondary | ICD-10-CM | POA: Diagnosis not present

## 2019-06-25 DIAGNOSIS — G8929 Other chronic pain: Secondary | ICD-10-CM | POA: Insufficient documentation

## 2019-06-25 DIAGNOSIS — Z6841 Body Mass Index (BMI) 40.0 and over, adult: Secondary | ICD-10-CM | POA: Diagnosis present

## 2019-06-25 DIAGNOSIS — M47816 Spondylosis without myelopathy or radiculopathy, lumbar region: Secondary | ICD-10-CM

## 2019-06-25 DIAGNOSIS — M25512 Pain in left shoulder: Secondary | ICD-10-CM | POA: Insufficient documentation

## 2019-06-25 DIAGNOSIS — M19012 Primary osteoarthritis, left shoulder: Secondary | ICD-10-CM

## 2019-06-25 MED ORDER — FENTANYL CITRATE (PF) 100 MCG/2ML IJ SOLN
INTRAMUSCULAR | Status: AC
  Filled 2019-06-25: qty 2

## 2019-06-25 MED ORDER — METHYLPREDNISOLONE ACETATE 80 MG/ML IJ SUSP
80.0000 mg | Freq: Once | INTRAMUSCULAR | Status: AC
  Administered 2019-06-25: 09:00:00 80 mg via INTRA_ARTICULAR

## 2019-06-25 MED ORDER — FENTANYL CITRATE (PF) 100 MCG/2ML IJ SOLN
25.0000 ug | INTRAMUSCULAR | Status: DC | PRN
  Administered 2019-06-25: 50 ug via INTRAVENOUS

## 2019-06-25 MED ORDER — ROPIVACAINE HCL 2 MG/ML IJ SOLN
9.0000 mL | Freq: Once | INTRAMUSCULAR | Status: AC
  Administered 2019-06-25: 9 mL via INTRA_ARTICULAR

## 2019-06-25 MED ORDER — ROPIVACAINE HCL 2 MG/ML IJ SOLN
5.0000 mL | Freq: Once | INTRAMUSCULAR | Status: AC
  Administered 2019-06-25: 5 mL via INTRA_ARTICULAR
  Filled 2019-06-25: qty 10

## 2019-06-25 MED ORDER — SODIUM HYALURONATE (VISCOSUP) 20 MG/2ML IX SOSY
2.0000 mL | PREFILLED_SYRINGE | Freq: Once | INTRA_ARTICULAR | Status: AC
  Administered 2019-06-25: 2 mL via INTRA_ARTICULAR

## 2019-06-25 MED ORDER — MIDAZOLAM HCL 5 MG/5ML IJ SOLN
1.0000 mg | INTRAMUSCULAR | Status: DC | PRN
  Administered 2019-06-25: 2 mg via INTRAVENOUS

## 2019-06-25 MED ORDER — LIDOCAINE HCL 2 % IJ SOLN
20.0000 mL | Freq: Once | INTRAMUSCULAR | Status: AC
  Administered 2019-06-25: 400 mg
  Filled 2019-06-25: qty 20

## 2019-06-25 MED ORDER — METHYLPREDNISOLONE ACETATE 80 MG/ML IJ SUSP
INTRAMUSCULAR | Status: AC
  Filled 2019-06-25: qty 1

## 2019-06-25 MED ORDER — LACTATED RINGERS IV SOLN: 1000.0000 mL | Freq: Once | INTRAVENOUS | Status: DC

## 2019-06-25 MED ORDER — LIDOCAINE HCL (PF) 1 % IJ SOLN
5.0000 mL | Freq: Once | INTRAMUSCULAR | Status: AC
  Administered 2019-06-25: 09:00:00 5 mL
  Filled 2019-06-25: qty 5

## 2019-06-25 MED ORDER — MIDAZOLAM HCL 5 MG/5ML IJ SOLN
INTRAMUSCULAR | Status: AC
  Filled 2019-06-25: qty 5

## 2019-06-25 MED ORDER — LIDOCAINE HCL 2 % IJ SOLN
20.0000 mL | Freq: Once | INTRAMUSCULAR | Status: AC
  Administered 2019-06-25: 09:00:00 400 mg

## 2019-06-25 MED ORDER — ROPIVACAINE HCL 2 MG/ML IJ SOLN
INTRAMUSCULAR | Status: AC
  Filled 2019-06-25: qty 10

## 2019-06-25 NOTE — Progress Notes (Signed)
Patient's Name: Lauren [name]  MRN: [id number]  Referring Provider: Hubbard Hartshorn, FNP  DOB: March 04, 1976  PCP: Lorelee Market, MD  DOS: 06/25/2019  Note by: Gaspar Cola, MD  Service setting: Ambulatory outpatient  Specialty: Interventional Pain Management  Patient type: Established  Location: [facility] (AMB) Pain Management Facility  Visit type: Interventional Procedure   Primary Reason for Visit: Interventional Pain Management Treatment. CC: Knee Pain (Bilateral knee pain radiates to foof) and Shoulder Pain (Left worse,radiating down arm to hands with numbness)  Procedure #1:  Anesthesia, Analgesia, Anxiolysis:  Type: Therapeutic Intra-Articular Hyalgan Knee Injection #4  Region: Lateral infrapatellar Knee Region Level: Knee Joint Laterality: Bilateral  Type: Moderate (Conscious) Sedation combined with Local Anesthesia Indication(s): Analgesia and Anxiety Local Anesthetic: Lidocaine 1-2% Route: Intravenous (IV) IV Access: Secured Sedation: Meaningful verbal contact was maintained at all times during the procedure   Position: Supine w/ knee bent 20 to 30 degrees   Indications: 1. Osteoarthritis of knees (Bilateral)   2. Chronic knee pain (Primary Area of Pain) (Bilateral) (L>R)   3. Morbid obesity with BMI of 50.0-59.9, adult ([place])    Procedure #2:  Anesthesia, Analgesia, Anxiolysis:  Type: Diagnostic Glenohumeral Joint (shoulder) Injection          Primary Purpose: Diagnostic Region: Superior Shoulder Area Level:  Shoulder Target Area: Glenohumeral Joint (shoulder) Approach: Anterior approach. Laterality: Left-Sided  Type: Moderate (Conscious) Sedation combined with Local Anesthesia Indication(s): Analgesia and Anxiety Route: Intravenous (IV) IV Access: Secured Sedation: Meaningful verbal contact was maintained at all times during the procedure  Local Anesthetic: Lidocaine 1-2%  Position: Supine   Indications: 1. Chronic shoulder pain (Fourth Area of Pain)  (Bilateral) (L>R)   2. Osteoarthritis of shoulder (Left)    Pain Score: Pre-procedure: 7 /10 Post-procedure: 0-No pain(Shoulder no pain, knee pain level 2)/10   Pre-op Assessment:  Ms. Teschner is a 43 y.o. (year old), female patient, seen today for interventional treatment. She  has a past surgical history that includes Rotator cuff repair; Knee surgery; Cholecystectomy; Cesarean section (2010); Cesarean section (2011); Tonsillectomy; Cardiac electrophysiology study and ablation (01/18/2018); and Excision mass neck (Left, 10/11/2018). Ms. Maberry has a current medication list which includes the following prescription(s): albuterol, aspirin ec, beclomethasone, diltiazem, esomeprazole, fluticasone, furosemide, hydrocodone-acetaminophen, hydrocodone-acetaminophen, hydrocodone-acetaminophen, ibuprofen, losartan-hydrochlorothiazide, medroxyprogesterone acetate, metoprolol succinate, pregabalin, and stiolto respimat, and the following Facility-Administered Medications: fentanyl, lactated ringers, and midazolam. Her primarily concern today is the Knee Pain (Bilateral knee pain radiates to foof) and Shoulder Pain (Left worse,radiating down arm to hands with numbness)  Initial Vital Signs:  Pulse/HCG Rate: 88ECG Heart Rate: 84 Temp: (!) 97.3 F (36.3 C) Resp: 20 BP: 137/77 SpO2: 100 %  BMI: Estimated body mass index is 53.04 kg/m as calculated from the following:   Height as of this encounter: 5\' 4"  (1.626 m).   Weight as of this encounter: 309 lb (140.2 kg).  Risk Assessment: Allergies: Reviewed. She is allergic to nucynta [tapentadol] and tape.  Allergy Precautions: None required Coagulopathies: Reviewed. None identified.  Blood-thinner therapy: None at this time Active Infection(s): Reviewed. None identified. Ms. Kerbow is afebrile  Site Confirmation: Ms. Hutzel was asked to confirm the procedure and laterality before marking the site Procedure checklist: Completed Consent: Before  the procedure and under the influence of no sedative(s), amnesic(s), or anxiolytics, the patient was informed of the treatment options, risks and possible complications. To fulfill our ethical and legal obligations, as recommended by the American Medical Association's Code of Ethics, I have  informed the patient of my clinical impression; the nature and purpose of the treatment or procedure; the risks, benefits, and possible complications of the intervention; the alternatives, including doing nothing; the risk(s) and benefit(s) of the alternative treatment(s) or procedure(s); and the risk(s) and benefit(s) of doing nothing. The patient was provided information about the general risks and possible complications associated with the procedure. These may include, but are not limited to: failure to achieve desired goals, infection, bleeding, organ or nerve damage, allergic reactions, paralysis, and death. In addition, the patient was informed of those risks and complications associated to the procedure, such as failure to decrease pain; infection; bleeding; organ or nerve damage with subsequent damage to sensory, motor, and/or autonomic systems, resulting in permanent pain, numbness, and/or weakness of one or several areas of the body; allergic reactions; (i.e.: anaphylactic reaction); and/or death. Furthermore, the patient was informed of those risks and complications associated with the medications. These include, but are not limited to: allergic reactions (i.e.: anaphylactic or anaphylactoid reaction(s)); adrenal axis suppression; blood sugar elevation that in diabetics may result in ketoacidosis or comma; water retention that in patients with history of congestive heart failure may result in shortness of breath, pulmonary edema, and decompensation with resultant heart failure; weight gain; swelling or edema; medication-induced neural toxicity; particulate matter embolism and blood vessel occlusion with resultant  organ, and/or nervous system infarction; and/or aseptic necrosis of one or more joints. Finally, the patient was informed that Medicine is not an exact science; therefore, there is also the possibility of unforeseen or unpredictable risks and/or possible complications that may result in a catastrophic outcome. The patient indicated having understood very clearly. We have given the patient no guarantees and we have made no promises. Enough time was given to the patient to ask questions, all of which were answered to the patient's satisfaction. Ms. Cicchetti has indicated that she wanted to continue with the procedure. Attestation: I, the ordering provider, attest that I have discussed with the patient the benefits, risks, side-effects, alternatives, likelihood of achieving goals, and potential problems during recovery for the procedure that I have provided informed consent. Date   Time: 06/25/2019  8:10 AM  Pre-Procedure Preparation:  Monitoring: As per clinic protocol. Respiration, ETCO2, SpO2, BP, heart rate and rhythm monitor placed and checked for adequate function Safety Precautions: Patient was assessed for positional comfort and pressure points before starting the procedure. Time-out: I initiated and conducted the "Time-out" before starting the procedure, as per protocol. The patient was asked to participate by confirming the accuracy of the "Time Out" information. Verification of the correct person, site, and procedure were performed and confirmed by me, the nursing staff, and the patient. "Time-out" conducted as per Joint Commission's Universal Protocol (UP.01.01.01). Time: 0927  Description of Procedure #1:  Target Area: Knee Joint Approach: Just above the Lateral tibial plateau, lateral to the infrapatellar tendon. Area Prepped: Entire knee area, from the mid-thigh to the mid-shin. Prepping solution: DuraPrep (Iodine Povacrylex [0.7% available iodine] and Isopropyl Alcohol, 74% w/w) Safety  Precautions: Aspiration looking for blood return was conducted prior to all injections. At no point did we inject any substances, as a needle was being advanced. No attempts were made at seeking any paresthesias. Safe injection practices and needle disposal techniques used. Medications properly checked for expiration dates. SDV (single dose vial) medications used. Description of the Procedure: Protocol guidelines were followed. The patient was placed in position over the fluoroscopy table. The target area was identified and the area  prepped in the usual manner. Skin & deeper tissues infiltrated with local anesthetic. Appropriate amount of time allowed to pass for local anesthetics to take effect. The procedure needles were then advanced to the target area. Proper needle placement secured. Negative aspiration confirmed. Solution injected in intermittent fashion, asking for systemic symptoms every 0.5cc of injectate. The needles were then removed and the area cleansed, making sure to leave some of the prepping solution back to take advantage of its long term bactericidal properties. Vitals:   06/25/19 0946 06/25/19 0955 06/25/19 1005 06/25/19 1015  BP: (!) 155/87 120/77 127/65 130/64  Pulse:      Resp: 18 18 18 18   Temp:  (!) 97.3 F (36.3 C)  (!) 97.2 F (36.2 C)  SpO2: 98% 98% 98% 99%  Weight:      Height:        Start Time: 0927 hrs. End Time: 0946 hrs. Materials:  Needle(s) Type: Regular needle Gauge: 25G Length: 1.5-in Medication(s): Please see orders for medications and dosing details.  Imaging Guidance for procedure #1:  Type of Imaging Technique: Fluoroscopy Guidance (Non-spinal) Indication(s): Morbid obesity. Assistance in needle guidance and placement for procedures requiring needle placement in or near specific anatomical locations impossible to access without such assistance. Exposure Time: Please see nurses notes. Contrast: None used. Fluoroscopic Guidance: I was personally  present during the use of fluoroscopy. "Tunnel Vision Technique" used to obtain the best possible view of the target area. Parallax error corrected before commencing the procedure. "Direction-depth-direction" technique used to introduce the needle under continuous pulsed fluoroscopy. Once target was reached, antero-posterior, oblique, and lateral fluoroscopic projection used confirm needle placement in all planes. Images permanently stored in EMR. Ultrasound Guidance: N/A Interpretation: No contrast injected. I personally interpreted the imaging intraoperatively. Adequate needle placement confirmed in multiple planes. Permanent images saved into the patient's record.  Description of Procedure #2:  Area Prepped: Entire shoulder Area Prepping solution: DuraPrep (Iodine Povacrylex [0.7% available iodine] and Isopropyl Alcohol, 74% w/w) Safety Precautions: Aspiration looking for blood return was conducted prior to all injections. At no point did we inject any substances, as a needle was being advanced. No attempts were made at seeking any paresthesias. Safe injection practices and needle disposal techniques used. Medications properly checked for expiration dates. SDV (single dose vial) medications used. Description of the Procedure: Protocol guidelines were followed. The patient was placed in position over the procedure table. The target area was identified and the area prepped in the usual manner. Skin & deeper tissues infiltrated with local anesthetic. Appropriate amount of time allowed to pass for local anesthetics to take effect. The procedure needles were then advanced to the target area. Proper needle placement secured. Negative aspiration confirmed. Solution injected in intermittent fashion, asking for systemic symptoms every 0.5cc of injectate. The needles were then removed and the area cleansed, making sure to leave some of the prepping solution back to take advantage of its long term bactericidal  properties.    Vitals:   06/25/19 0946 06/25/19 0955 06/25/19 1005 06/25/19 1015  BP: (!) 155/87 120/77 127/65 130/64  Pulse:      Resp: 18 18 18 18   Temp:  (!) 97.3 F (36.3 C)  (!) 97.2 F (36.2 C)  SpO2: 98% 98% 98% 99%  Weight:      Height:        Start Time: 0927 hrs. End Time: 0946 hrs. Materials:  Needle(s) Type: Spinal Needle Gauge: 22G Length: 3.5-in Medication(s): Please see orders for medications  and dosing details.  Imaging Guidance (Non-Spinal) for procedure #2:  Type of Imaging Technique: Fluoroscopy Guidance (Non-Spinal) Indication(s): Assistance in needle guidance and placement for procedures requiring needle placement in or near specific anatomical locations not easily accessible without such assistance. Exposure Time: Please see nurses notes. Contrast: None used. Fluoroscopic Guidance: I was personally present during the use of fluoroscopy. "Tunnel Vision Technique" used to obtain the best possible view of the target area. Parallax error corrected before commencing the procedure. "Direction-depth-direction" technique used to introduce the needle under continuous pulsed fluoroscopy. Once target was reached, antero-posterior, oblique, and lateral fluoroscopic projection used confirm needle placement in all planes. Images permanently stored in EMR. Interpretation: No contrast injected. I personally interpreted the imaging intraoperatively. Adequate needle placement confirmed in multiple planes. Permanent images saved into the patient's record.  Antibiotic Prophylaxis:   Anti-infectives (From admission, onward)   None     Indication(s): None identified  Post-operative Assessment:  Post-procedure Vital Signs:  Pulse/HCG Rate: 8884 Temp: (!) 97.2 F (36.2 C) Resp: 18 BP: 130/64 SpO2: 99 %  EBL: None  Complications: No immediate post-treatment complications observed by team, or reported by patient.  Note: The patient tolerated the entire procedure well.  A repeat set of vitals were taken after the procedure and the patient was kept under observation following institutional policy, for this type of procedure. Post-procedural neurological assessment was performed, showing return to baseline, prior to discharge. The patient was provided with post-procedure discharge instructions, including a section on how to identify potential problems. Should any problems arise concerning this procedure, the patient was given instructions to immediately contact us, at any time, without hesitation. In any case, we plan to contact the patient by telephone for a follow-up status report regarding this interventional procedure.  Comments:  No additional relevant information.  Plan of Care  Orders:  Orders Placed This Encounter  Procedures   KNEE INJECTION    Hyalgan knee injection to be done by MD.    Scheduling Instructions:     Procedure: Intra-articular Hyalgan Knee injection #4     Side(s): Bilateral Knee     Sedation: None     Timeframe: Today    Order Specific Question:   Where will this procedure be performed?    Answer:   [facility]   KNEE INJECTION    Hyalgan knee injection. Please order Hyalgan.    Standing Status:   Future    Standing Expiration Date:   07/26/2019    Scheduling Instructions:     Procedure: Intra-articular Hyalgan Knee injection #5     Side: Bilateral     Sedation: None     Timeframe: in two (2) weeks    Order Specific Question:   Where will this procedure be performed?    Answer:   [facility]   SHOULDER INJECTION    Scheduling Instructions:     Side: Left-sided     Sedation: With Sedation.     Timeframe: Today    Order Specific Question:   Where will this procedure be performed?    Answer:   [facility]    Comments:   by Dr. Dossie Arbour   LUMBAR FACET(MEDIAL BRANCH NERVE BLOCK) MBNB    Standing Status:   Future    Standing Expiration Date:   07/26/2019    Scheduling Instructions:     Side:  Bilateral     Level: L3-4, L4-5, & L5-S1 Facets (L2, L3, L4, L5, & S1 Medial Branch  Nerves)     Sedation: Patient's choice.     Timeframe: 2 weeks from now    Order Specific Question:   Where will this procedure be performed?    Answer:   [facility]   DG PAIN CLINIC C-ARM 1-60 MIN NO REPORT    Intraoperative interpretation by procedural physician at [place].    Standing Status:   Standing    Number of Occurrences:   1    Order Specific Question:   Reason for exam:    Answer:   Assistance in needle guidance and placement for procedures requiring needle placement in or near specific anatomical locations not easily accessible without such assistance.   Informed Consent Details: Physician/Practitioner Attestation; Transcribe to consent form and obtain patient signature    Provider Attestation: I, [place] Dossie Arbour, MD, (Pain Management Specialist), the physician/practitioner, attest that I have discussed with the patient the benefits, risks, side effects, alternatives, likelihood of achieving goals and potential problems during recovery for the procedure that I have provided informed consent.    Scheduling Instructions:     Procedure: Therapeutic, bilateral, intra-articular Hyalgan knee injection     Indications: Chronic bilateral knee pain secondary to primary osteoarthritis of the knee     Note: Always confirm laterality of pain with Ms. Bither, before procedure.     Transcribe to consent form and obtain patient signature.   Chronic Opioid Analgesic:  Hydrocodone/APAP 5/325, 1 tab PO q 8 hrs (15 mg/day of hydrocodone) (has enough until 06/11/2019) MME/day: 15 mg/day.   Medications ordered for procedure: Meds ordered this encounter  Medications   lidocaine (XYLOCAINE) 2 % (with pres) injection 400 mg   lidocaine (PF) (XYLOCAINE) 1 % injection 5 mL   ropivacaine (PF) 2 mg/mL (0.2%) (NAROPIN) injection 5 mL   Sodium Hyaluronate SOSY 2 mL   Sodium  Hyaluronate SOSY 2 mL   lidocaine (XYLOCAINE) 2 % (with pres) injection 400 mg   lactated ringers infusion 1,000 mL   midazolam (VERSED) 5 MG/5ML injection 1-2 mg    Make sure Flumazenil is available in the pyxis when using this medication. If oversedation occurs, administer 0.2 mg IV over 15 sec. If after 45 sec no response, administer 0.2 mg again over 1 min; may repeat at 1 min intervals; not to exceed 4 doses (1 mg)   fentaNYL (SUBLIMAZE) injection 25-50 mcg    Make sure Narcan is available in the pyxis when using this medication. In the event of respiratory depression (RR< 8/min): Titrate NARCAN (naloxone) in increments of 0.1 to 0.2 mg IV at 2-3 minute intervals, until desired degree of reversal.   ropivacaine (PF) 2 mg/mL (0.2%) (NAROPIN) injection 9 mL   methylPREDNISolone acetate (DEPO-MEDROL) injection 80 mg   Medications administered: We administered lidocaine, lidocaine (PF), ropivacaine (PF) 2 mg/mL (0.2%), Sodium Hyaluronate, Sodium Hyaluronate, lidocaine, midazolam, fentaNYL, ropivacaine (PF) 2 mg/mL (0.2%), and methylPREDNISolone acetate.  See the medical record for exact dosing, route, and time of administration.  Follow-up plan:   Return in about 2 weeks (around 07/09/2019) for Procedure (w/ sedation): (B) Hyalgan #5 + (B) L-FCT BLK.       Considering: NOTE:NO Lumbar RFAuntil BMIis at or below 35. Diagnostic bilateral knee genicular nerve block #2 Diagnostic left intra-articular wrist injection (MNB/TPI) #2 Diagnostic bilateral cervical facet nerve block Diagnostic bilateral cervical ESI Possible bilateral cervical facet RFA Diagnostic bilateral intra-articular shoulder injection #3 Diagnostic bilateral suprascapular nerve block Possible bilateral suprascapular nerve RFA Diagnostic bilateral LESI Diagnostic  right lumbar facet nerve block #2 Possible bilateral lumbar facet RFA(NO Lumbar RFAuntil BMIis at or below 35.)   Palliative PRN  treatment(s): Palliative bilateral intra-articular Hyalgan knee injection [id number] fluoroscopic guidance Palliative bilateral intra-articular knee joint (steroid) #2  Therapeutic left-sided L4-5 LESI #2        Recent Visits Date Type Provider Dept  06/03/19 Office Visit Milinda Pointer, MD [facility]  04/02/19 Procedure visit Milinda Pointer, MD [facility]  Showing recent visits within past 90 days and meeting all other requirements   Today's Visits Date Type Provider Dept  06/25/19 Procedure visit Milinda Pointer, MD [facility]  Showing today's visits and meeting all other requirements   Future Appointments Date Type Provider Dept  07/09/19 Appointment Milinda Pointer, MD [facility]  Showing future appointments within next 90 days and meeting all other requirements   Disposition: Discharge home  Discharge Date & Time: 06/25/2019; 1015 hrs.   Primary Care Physician: Lorelee Market, MD Location: [facility] Note by: Gaspar Cola, MD Date: 06/25/2019; Time: 11:07 AM  Disclaimer:  Medicine is not an Chief Strategy Officer. The only guarantee in medicine is that nothing is guaranteed. It is important to note that the decision to proceed with this intervention was based on the information collected from the patient. The Data and conclusions were drawn from the patient's questionnaire, the interview, and the physical examination. Because the information was provided in large part by the patient, it cannot be guaranteed that it has not been purposely or unconsciously manipulated. Every effort has been made to obtain as much relevant data as possible for this evaluation. It is important to note that the conclusions that lead to this procedure are derived in large part from the available data. Always take into account that the treatment will also be dependent on availability of resources and existing  treatment guidelines, considered by other Pain Management Practitioners as being common knowledge and practice, at the time of the intervention. For Medico-Legal purposes, it is also important to point out that variation in procedural techniques and pharmacological choices are the acceptable norm. The indications, contraindications, technique, and results of the above procedure should only be interpreted and judged by a Board-Certified Interventional Pain Specialist with extensive familiarity and expertise in the same exact procedure and technique.

## 2019-06-25 NOTE — Patient Instructions (Addendum)
____________________________________________________________________________________________  Post-Procedure Discharge Instructions  Instructions:  Apply ice:   Purpose: This will minimize any swelling and discomfort after procedure.   When: Day of procedure, as soon as you get home.  How: Fill a plastic sandwich bag with crushed ice. Cover it with a small towel and apply to injection site.  How long: (15 min on, 15 min off) Apply for 15 minutes then remove x 15 minutes.  Repeat sequence on day of procedure, until you go to bed.  Apply heat:   Purpose: To treat any soreness and discomfort from the procedure.  When: Starting the next day after the procedure.  How: Apply heat to procedure site starting the day following the procedure.  How long: May continue to repeat daily, until discomfort goes away.  Food intake: Start with clear liquids (like water) and advance to regular food, as tolerated.   Physical activities: Keep activities to a minimum for the first 8 hours after the procedure. After that, then as tolerated.  Driving: If you have received any sedation, be responsible and do not drive. You are not allowed to drive for 24 hours after having sedation.  Blood thinner: (Applies only to those taking blood thinners) You may restart your blood thinner 6 hours after your procedure.  Insulin: (Applies only to Diabetic patients taking insulin) As soon as you can eat, you may resume your normal dosing schedule.  Infection prevention: Keep procedure site clean and dry. Shower daily and clean area with soap and water.  Post-procedure Pain Diary: Extremely important that this be done correctly and accurately. Recorded information will be used to determine the next step in treatment. For the purpose of accuracy, follow these rules:  Evaluate only the area treated. Do not report or include pain from an untreated area. For the purpose of this evaluation, ignore all other areas of pain,  except for the treated area.  After your procedure, avoid taking a long nap and attempting to complete the pain diary after you wake up. Instead, set your alarm clock to go off every hour, on the hour, for the initial 8 hours after the procedure. Document the duration of the numbing medicine, and the relief you are getting from it.  Do not go to sleep and attempt to complete it later. It will not be accurate. If you received sedation, it is likely that you were given a medication that may cause amnesia. Because of this, completing the diary at a later time may cause the information to be inaccurate. This information is needed to plan your care.  Follow-up appointment: Keep your post-procedure follow-up evaluation appointment after the procedure (usually 2 weeks for most procedures, 6 weeks for radiofrequencies). DO NOT FORGET to bring you pain diary with you.   Expect: (What should I expect to see with my procedure?)  From numbing medicine (AKA: Local Anesthetics): Numbness or decrease in pain. You may also experience some weakness, which if present, could last for the duration of the local anesthetic.  Onset: Full effect within 15 minutes of injected.  Duration: It will depend on the type of local anesthetic used. On the average, 1 to 8 hours.   From steroids (Applies only if steroids were used): Decrease in swelling or inflammation. Once inflammation is improved, relief of the pain will follow.  Onset of benefits: Depends on the amount of swelling present. The more swelling, the longer it will take for the benefits to be seen. In some cases, up to 10 days.    Duration: Steroids will stay in the system x 2 weeks. Duration of benefits will depend on multiple posibilities including persistent irritating factors.  Side-effects: If present, they may typically last 2 weeks (the duration of the steroids).  Frequent: Cramps (if they occur, drink Gatorade and take over-the-counter Magnesium 450-500 mg  once to twice a day); water retention with temporary weight gain; increases in blood sugar; decreased immune system response; increased appetite.  Occasional: Facial flushing (red, warm cheeks); mood swings; menstrual changes.  Uncommon: Long-term decrease or suppression of natural hormones; bone thinning. (These are more common with higher doses or more frequent use. This is why we prefer that our patients avoid having any injection therapies in other practices.)   Very Rare: Severe mood changes; psychosis; aseptic necrosis.  From procedure: Some discomfort is to be expected once the numbing medicine wears off. This should be minimal if ice and heat are applied as instructed.  Call if: (When should I call?)  You experience numbness and weakness that gets worse with time, as opposed to wearing off.  New onset bowel or bladder incontinence. (Applies only to procedures done in the spine)  Emergency Numbers:  Durning business hours (Monday - Thursday, 8:00 AM - 4:00 PM) (Friday, 9:00 AM - 12:00 Noon): [phone]  After hours: [phone]  NOTE: If you are having a problem and are unable connect with, or to talk to a provider, then go to your nearest urgent care or emergency department. If the problem is serious and urgent, please call 911. ____________________________________________________________________________________________   ____________________________________________________________________________________________  Preparing for Procedure with Sedation  Procedure appointments are limited to planned procedures: . No Prescription Refills. . No disability issues will be discussed. . No medication changes will be discussed.  Instructions: . Oral Intake: Do not eat or drink anything for at least 8 hours prior to your procedure. . Transportation: Public transportation is not allowed. Bring an adult driver. The driver must be physically present in our waiting room before  any procedure can be started. . Physical Assistance: Bring an adult physically capable of assisting you, in the event you need help. This adult should keep you company at home for at least 6 hours after the procedure. . Blood Pressure Medicine: Take your blood pressure medicine with a sip of water the morning of the procedure. . Blood thinners: Notify our staff if you are taking any blood thinners. Depending on which one you take, there will be specific instructions on how and when to stop it. . Diabetics on insulin: Notify the staff so that you can be scheduled 1st case in the morning. If your diabetes requires high dose insulin, take only  of your normal insulin dose the morning of the procedure and notify the staff that you have done so. . Preventing infections: Shower with an antibacterial soap the morning of your procedure. . Build-up your immune system: Take 1000 mg of Vitamin C with every meal (3 times a day) the day prior to your procedure. . Antibiotics: Inform the staff if you have a condition or reason that requires you to take antibiotics before dental procedures. . Pregnancy: If you are pregnant, call and cancel the procedure. . Sickness: If you have a cold, fever, or any active infections, call and cancel the procedure. . Arrival: You must be in the facility at least 30 minutes prior to your scheduled procedure. . Children: Do not bring children with you. . Dress appropriately: Bring dark clothing that you would not mind   if they get stained. . Valuables: Do not bring any jewelry or valuables.  Reasons to call and reschedule or cancel your procedure: (Following these recommendations will minimize the risk of a serious complication.) . Surgeries: Avoid having procedures within 2 weeks of any surgery. (Avoid for 2 weeks before or after any surgery). . Flu Shots: Avoid having procedures within 2 weeks of a flu shots or . (Avoid for 2 weeks before or after immunizations). . Barium: Avoid  having a procedure within 7-10 days after having had a radiological study involving the use of radiological contrast. (Myelograms, Barium swallow or enema study). . Heart attacks: Avoid any elective procedures or surgeries for the initial 6 months after a "Myocardial Infarction" (Heart Attack). . Blood thinners: It is imperative that you stop these medications before procedures. Let us know if you if you take any blood thinner.  . Infection: Avoid procedures during or within two weeks of an infection (including chest colds or gastrointestinal problems). Symptoms associated with infections include: Localized redness, fever, chills, night sweats or profuse sweating, burning sensation when voiding, cough, congestion, stuffiness, runny nose, sore throat, diarrhea, nausea, vomiting, cold or Flu symptoms, recent or current infections. It is specially important if the infection is over the area that we intend to treat. . Heart and lung problems: Symptoms that may suggest an active cardiopulmonary problem include: cough, chest pain, breathing difficulties or shortness of breath, dizziness, ankle swelling, uncontrolled high or unusually low blood pressure, and/or palpitations. If you are experiencing any of these symptoms, cancel your procedure and contact your primary care physician for an evaluation.  Remember:  Regular Business hours are:  Monday to Thursday 8:00 AM to 4:00 PM  Provider's Schedule: Kirtis Challis, MD:  Procedure days: Tuesday and Thursday 7:30 AM to 4:00 PM  Bilal Lateef, MD:  Procedure days: Monday and Wednesday 7:30 AM to 4:00 PM ____________________________________________________________________________________________   

## 2019-06-26 ENCOUNTER — Telehealth: Payer: Self-pay

## 2019-06-26 NOTE — Telephone Encounter (Signed)
Pt was called and answering service was full.

## 2019-06-27 ENCOUNTER — Other Ambulatory Visit
Admission: RE | Admit: 2019-06-27 | Discharge: 2019-06-27 | Disposition: A | Discharge status: Home / Self Care | Payer: Medicaid Other | Source: Ambulatory Visit | Attending: Pain Medicine | Admitting: Pain Medicine

## 2019-06-27 DIAGNOSIS — M899 Disorder of bone, unspecified: Secondary | ICD-10-CM | POA: Diagnosis not present

## 2019-06-27 DIAGNOSIS — Z789 Other specified health status: Secondary | ICD-10-CM | POA: Insufficient documentation

## 2019-06-27 DIAGNOSIS — G894 Chronic pain syndrome: Secondary | ICD-10-CM | POA: Diagnosis not present

## 2019-06-27 DIAGNOSIS — Z79899 Other long term (current) drug therapy: Secondary | ICD-10-CM | POA: Insufficient documentation

## 2019-06-27 LAB — VITAMIN D 25 HYDROXY (VIT D DEFICIENCY, FRACTURES): Vit D, 25-Hydroxy: 23.11 ng/mL — ABNORMAL LOW (ref 30–100)

## 2019-06-27 LAB — COMPREHENSIVE METABOLIC PANEL
ALT: 12 U/L (ref 0–44)
AST: 14 U/L — ABNORMAL LOW (ref 15–41)
Albumin: 3.9 g/dL (ref 3.5–5.0)
Alkaline Phosphatase: 51 U/L (ref 38–126)
Anion gap: 11 (ref 5–15)
BUN: 14 mg/dL (ref 6–20)
CO2: 26 mmol/L (ref 22–32)
Calcium: 9 mg/dL (ref 8.9–10.3)
Chloride: 103 mmol/L (ref 98–111)
Creatinine, Ser: 0.65 mg/dL (ref 0.44–1.00)
GFR calc Af Amer: >60 mL/min (ref >60)
GFR calc non Af Amer: >60 mL/min (ref >60)
Glucose, Bld: 101 mg/dL — ABNORMAL HIGH (ref 70–99)
Potassium: 3.8 mmol/L (ref 3.5–5.1)
Sodium: 140 mmol/L (ref 135–145)
Total Bilirubin: 0.5 mg/dL (ref 0.3–1.2)
Total Protein: 7.5 g/dL (ref 6.5–8.1)

## 2019-06-27 LAB — C-REACTIVE PROTEIN: CRP: 2.7 mg/dL — ABNORMAL HIGH (ref <1.0)

## 2019-06-27 LAB — VITAMIN B12: Vitamin B-12: 432 pg/mL (ref 180–914)

## 2019-06-27 LAB — MAGNESIUM: Magnesium: 1.8 mg/dL (ref 1.7–2.4)

## 2019-06-27 LAB — SEDIMENTATION RATE: Sed Rate: 39 mm/hr — ABNORMAL HIGH (ref 0–20)

## 2019-07-01 LAB — TOXASSURE SELECT 13 (MW), URINE

## 2019-07-09 ENCOUNTER — Ambulatory Visit: Payer: Medicaid Other | Admitting: Pain Medicine

## 2019-07-09 NOTE — Progress Notes (Deleted)
Patient's Name: Lauren [name]  MRN: [id number]  Referring Provider: Lorelee Market, MD  DOB: 1975/10/24  PCP: Lorelee Market, MD  DOS: 07/09/2019  Note by: Gaspar Cola, MD  Service setting: Ambulatory outpatient  Specialty: Interventional Pain Management  Patient type: Established  Location: [facility] (AMB) Pain Management Facility  Visit type: Interventional Procedure   Primary Reason for Visit: Interventional Pain Management Treatment. CC: No chief complaint on file.  Procedure #1:  Anesthesia, Analgesia, Anxiolysis:  Type: Therapeutic Intra-Articular Hyalgan Knee Injection #S2N5  Region: Lateral infrapatellar Knee Region Level: Knee Joint Laterality: Bilateral  Type: Moderate (Conscious) Sedation combined with Local Anesthesia Indication(s): Analgesia and Anxiety Local Anesthetic: Lidocaine 1-2% Route: Intravenous (IV) IV Access: Secured Sedation: Meaningful verbal contact was maintained at all times during the procedure   Position: Supine w/ knee bent 20 to 30 degrees   Indications: 1. Chronic knee pain (Primary Area of Pain) (Bilateral) (L>R)   2. Osteoarthritis of knees (Bilateral)    Procedure #2:  Anesthesia, Analgesia, Anxiolysis:  Type: Lumbar Facet, Medial Branch Block(s) #R2/L1 Primary Purpose: Diagnostic Region: Posterolateral Lumbosacral Spine Level: L2, L3, L4, L5, & S1 Medial Branch Level(s). Injecting these levels blocks the L3-4, L4-5, and L5-S1 lumbar facet joints. Laterality: Bilateral  Type: Moderate (Conscious) Sedation combined with Local Anesthesia Indication(s): Analgesia and Anxiety Route: Intravenous (IV) IV Access: Secured Sedation: Meaningful verbal contact was maintained at all times during the procedure  Local Anesthetic: Lidocaine 1-2%  Position: Prone   Indications: 1. Lumbar facet syndrome (Bilateral) (R>L)   2. Spondylosis without myelopathy or radiculopathy, lumbosacral region   3. DDD (degenerative disc disease), lumbar   4.  Chronic low back pain (Fifth Area of Pain) (Bilateral) (R>L) w/o sciatica    Pain Score: Pre-procedure:  /10 Post-procedure:  /10   Pre-op Assessment:  Lauren [name] is a 43 y.o. (year old), female patient, seen today for interventional treatment. She  has a past surgical history that includes Rotator cuff repair; Knee surgery; Cholecystectomy; Cesarean section (2010); Cesarean section (2011); Tonsillectomy; Cardiac electrophysiology study and ablation (01/18/2018); and Excision mass neck (Left, 10/11/2018). Lauren [name] has a current medication list which includes the following prescription(s): albuterol, aspirin ec, beclomethasone, diltiazem, esomeprazole, fluticasone, furosemide, hydrocodone-acetaminophen, hydrocodone-acetaminophen, hydrocodone-acetaminophen, ibuprofen, losartan-hydrochlorothiazide, medroxyprogesterone acetate, metoprolol succinate, pregabalin, and stiolto respimat. Her primarily concern today is the No chief complaint on file.  Initial Vital Signs:  Pulse/HCG Rate:    Temp:   Resp:   BP:   SpO2:    BMI: Estimated body mass index is 53.04 kg/m as calculated from the following:   Height as of 06/25/19: 5\' 4"  (1.626 m).   Weight as of 06/25/19: 309 lb (140.2 kg).  Risk Assessment: Allergies: Reviewed. She is allergic to nucynta [tapentadol] and tape.  Allergy Precautions: None required Coagulopathies: Reviewed. None identified.  Blood-thinner therapy: None at this time Active Infection(s): Reviewed. None identified. Lauren [name] is afebrile  Site Confirmation: Lauren [name] was asked to confirm the procedure and laterality before marking the site Procedure checklist: Completed Consent: Before the procedure and under the influence of no sedative(s), amnesic(s), or anxiolytics, the patient was informed of the treatment options, risks and possible complications. To fulfill our ethical and legal obligations, as recommended by the American Medical Association's Code of  Ethics, I have informed the patient of my clinical impression; the nature and purpose of the treatment or procedure; the risks, benefits, and possible complications of the intervention; the alternatives, including doing nothing; the risk(s) and benefit(s) of  the alternative treatment(s) or procedure(s); and the risk(s) and benefit(s) of doing nothing. The patient was provided information about the general risks and possible complications associated with the procedure. These may include, but are not limited to: failure to achieve desired goals, infection, bleeding, organ or nerve damage, allergic reactions, paralysis, and death. In addition, the patient was informed of those risks and complications associated to the procedure, such as failure to decrease pain; infection; bleeding; organ or nerve damage with subsequent damage to sensory, motor, and/or autonomic systems, resulting in permanent pain, numbness, and/or weakness of one or several areas of the body; allergic reactions; (i.e.: anaphylactic reaction); and/or death. Furthermore, the patient was informed of those risks and complications associated with the medications. These include, but are not limited to: allergic reactions (i.e.: anaphylactic or anaphylactoid reaction(s)); adrenal axis suppression; blood sugar elevation that in diabetics may result in ketoacidosis or comma; water retention that in patients with history of congestive heart failure may result in shortness of breath, pulmonary edema, and decompensation with resultant heart failure; weight gain; swelling or edema; medication-induced neural toxicity; particulate matter embolism and blood vessel occlusion with resultant organ, and/or nervous system infarction; and/or aseptic necrosis of one or more joints. Finally, the patient was informed that Medicine is not an exact science; therefore, there is also the possibility of unforeseen or unpredictable risks and/or possible complications that may  result in a catastrophic outcome. The patient indicated having understood very clearly. We have given the patient no guarantees and we have made no promises. Enough time was given to the patient to ask questions, all of which were answered to the patient's satisfaction. Lauren [name] has indicated that she wanted to continue with the procedure. Attestation: I, the ordering provider, attest that I have discussed with the patient the benefits, risks, side-effects, alternatives, likelihood of achieving goals, and potential problems during recovery for the procedure that I have provided informed consent. Date   Time: {CHL [facility]-PAIN TIME CHOICES:[id number]}  Pre-Procedure Preparation:  Monitoring: As per clinic protocol. Respiration, ETCO2, SpO2, BP, heart rate and rhythm monitor placed and checked for adequate function Safety Precautions: Patient was assessed for positional comfort and pressure points before starting the procedure. Time-out: I initiated and conducted the "Time-out" before starting the procedure, as per protocol. The patient was asked to participate by confirming the accuracy of the "Time Out" information. Verification of the correct person, site, and procedure were performed and confirmed by me, the nursing staff, and the patient. "Time-out" conducted as per Joint Commission's Universal Protocol (UP.01.01.01). Time:    Description of Procedure #1:  Target Area: Knee Joint Approach: Just above the Lateral tibial plateau, lateral to the infrapatellar tendon. Area Prepped: Entire knee area, from the mid-thigh to the mid-shin. Prepping solution: DuraPrep (Iodine Povacrylex [0.7% available iodine] and Isopropyl Alcohol, 74% w/w) Safety Precautions: Aspiration looking for blood return was conducted prior to all injections. At no point did we inject any substances, as a needle was being advanced. No attempts were made at seeking any paresthesias. Safe injection practices and needle disposal  techniques used. Medications properly checked for expiration dates. SDV (single dose vial) medications used. Description of the Procedure: Protocol guidelines were followed. The patient was placed in position over the fluoroscopy table. The target area was identified and the area prepped in the usual manner. Skin & deeper tissues infiltrated with local anesthetic. Appropriate amount of time allowed to pass for local anesthetics to take effect. The procedure needles were then advanced to the  target area. Proper needle placement secured. Negative aspiration confirmed. Solution injected in intermittent fashion, asking for systemic symptoms every 0.5cc of injectate. The needles were then removed and the area cleansed, making sure to leave some of the prepping solution back to take advantage of its long term bactericidal properties.  There were no vitals filed for this visit.   Start Time:   hrs.  Materials:  Needle(s) Type: Regular needle Gauge: 22G Length: 3.5-in Medication(s): Please see orders for medications and dosing details.  Imaging Guidance for procedure #1:  Type of Imaging Technique: Fluoroscopy Guidance (Non-spinal) Indication(s): Morbid obesity. Assistance in needle guidance and placement for procedures requiring needle placement in or near specific anatomical locations impossible to access without such assistance. Exposure Time: Please see nurses notes. Contrast: None used. Fluoroscopic Guidance: I was personally present during the use of fluoroscopy. "Tunnel Vision Technique" used to obtain the best possible view of the target area. Parallax error corrected before commencing the procedure. "Direction-depth-direction" technique used to introduce the needle under continuous pulsed fluoroscopy. Once target was reached, antero-posterior, oblique, and lateral fluoroscopic projection used confirm needle placement in all planes. Images permanently stored in EMR. Ultrasound Guidance:  N/A Interpretation: No contrast injected. I personally interpreted the imaging intraoperatively. Adequate needle placement confirmed in multiple planes. Permanent images saved into the patient's record.  Description of Procedure #2:  Laterality: Bilateral. The procedure was performed in identical fashion on both sides. Levels:  L2, L3, L4, L5, & S1 Medial Branch Level(s) Area Prepped: Posterior Lumbosacral Region Prepping solution: DuraPrep (Iodine Povacrylex [0.7% available iodine] and Isopropyl Alcohol, 74% w/w) Safety Precautions: Aspiration looking for blood return was conducted prior to all injections. At no point did we inject any substances, as a needle was being advanced. Before injecting, the patient was told to immediately notify me if she was experiencing any new onset of "ringing in the ears, or metallic taste in the mouth". No attempts were made at seeking any paresthesias. Safe injection practices and needle disposal techniques used. Medications properly checked for expiration dates. SDV (single dose vial) medications used. After the completion of the procedure, all disposable equipment used was discarded in the proper designated medical waste containers. Local Anesthesia: Protocol guidelines were followed. The patient was positioned over the fluoroscopy table. The area was prepped in the usual manner. The time-out was completed. The target area was identified using fluoroscopy. A 12-in long, straight, sterile hemostat was used with fluoroscopic guidance to locate the targets for each level blocked. Once located, the skin was marked with an approved surgical skin marker. Once all sites were marked, the skin (epidermis, dermis, and hypodermis), as well as deeper tissues (fat, connective tissue and muscle) were infiltrated with a small amount of a short-acting local anesthetic, loaded on a 10cc syringe with a 25G, 1.5-in  Needle. An appropriate amount of time was allowed for local anesthetics to  take effect before proceeding to the next step. Local Anesthetic: Lidocaine 2.0% The unused portion of the local anesthetic was discarded in the proper designated containers. Technical explanation of process:  L2 Medial Branch Nerve Block (MBB): The target area for the L2 medial branch is at the junction of the postero-lateral aspect of the superior articular process and the superior, posterior, and medial edge of the transverse process of L3. Under fluoroscopic guidance, a Quincke needle was inserted until contact was made with os over the superior postero-lateral aspect of the pedicular shadow (target area). After negative aspiration for blood, 0.5 mL of  the nerve block solution was injected without difficulty or complication. The needle was removed intact. L3 Medial Branch Nerve Block (MBB): The target area for the L3 medial branch is at the junction of the postero-lateral aspect of the superior articular process and the superior, posterior, and medial edge of the transverse process of L4. Under fluoroscopic guidance, a Quincke needle was inserted until contact was made with os over the superior postero-lateral aspect of the pedicular shadow (target area). After negative aspiration for blood, 0.5 mL of the nerve block solution was injected without difficulty or complication. The needle was removed intact. L4 Medial Branch Nerve Block (MBB): The target area for the L4 medial branch is at the junction of the postero-lateral aspect of the superior articular process and the superior, posterior, and medial edge of the transverse process of L5. Under fluoroscopic guidance, a Quincke needle was inserted until contact was made with os over the superior postero-lateral aspect of the pedicular shadow (target area). After negative aspiration for blood, 0.5 mL of the nerve block solution was injected without difficulty or complication. The needle was removed intact. L5 Medial Branch Nerve Block (MBB): The target area  for the L5 medial branch is at the junction of the postero-lateral aspect of the superior articular process and the superior, posterior, and medial edge of the sacral ala. Under fluoroscopic guidance, a Quincke needle was inserted until contact was made with os over the superior postero-lateral aspect of the pedicular shadow (target area). After negative aspiration for blood, 0.5 mL of the nerve block solution was injected without difficulty or complication. The needle was removed intact. S1 Medial Branch Nerve Block (MBB): The target area for the S1 medial branch is at the posterior and inferior 6 o'clock position of the L5-S1 facet joint. Under fluoroscopic guidance, the Quincke needle inserted for the L5 MBB was redirected until contact was made with os over the inferior and postero aspect of the sacrum, at the 6 o' clock position under the L5-S1 facet joint (Target area). After negative aspiration for blood, 0.5 mL of the nerve block solution was injected without difficulty or complication. The needle was removed intact.  Nerve block solution: 0.2% PF-Ropivacaine + Triamcinolone (40 mg/mL) diluted to a final concentration of 4 mg of Triamcinolone/mL of Ropivacaine The unused portion of the solution was discarded in the proper designated containers. Procedural Needles: 22-gauge, 7-inch, Quincke needles used for all levels.  Once the entire procedure was completed, the treated area was cleaned, making sure to leave some of the prepping solution back to take advantage of its long term bactericidal properties.   Illustration of the posterior view of the lumbar spine and the posterior neural structures. Laminae of L2 through S1 are labeled. DPRL5, dorsal primary ramus of L5; DPRS1, dorsal primary ramus of S1; DPR3, dorsal primary ramus of L3; FJ, facet (zygapophyseal) joint L3-L4; I, inferior articular process of L4; LB1, lateral branch of dorsal primary ramus of L1; IAB, inferior articular branches from L3  medial branch (supplies L4-L5 facet joint); IBP, intermediate branch plexus; MB3, medial branch of dorsal primary ramus of L3; NR3, third lumbar nerve root; S, superior articular process of L5; SAB, superior articular branches from L4 (supplies L4-5 facet joint also); TP3, transverse process of L3.  There were no vitals filed for this visit.   End Time:   hrs.  Imaging Guidance (Spinal) for procedure #2:  Type of Imaging Technique: Fluoroscopy Guidance (Spinal) Indication(s): Assistance in needle guidance and placement for procedures  requiring needle placement in or near specific anatomical locations not easily accessible without such assistance. Exposure Time: Please see nurses notes. Contrast: None used. Fluoroscopic Guidance: I was personally present during the use of fluoroscopy. "Tunnel Vision Technique" used to obtain the best possible view of the target area. Parallax error corrected before commencing the procedure. "Direction-depth-direction" technique used to introduce the needle under continuous pulsed fluoroscopy. Once target was reached, antero-posterior, oblique, and lateral fluoroscopic projection used confirm needle placement in all planes. Images permanently stored in EMR. Interpretation: No contrast injected. I personally interpreted the imaging intraoperatively. Adequate needle placement confirmed in multiple planes. Permanent images saved into the patient's record.  Antibiotic Prophylaxis:   Anti-infectives (From admission, onward)   None     Indication(s): None identified  Post-operative Assessment:  Post-procedure Vital Signs:  Pulse/HCG Rate:    Temp:   Resp:   BP:   SpO2:    EBL: None  Complications: No immediate post-treatment complications observed by team, or reported by patient.  Note: The patient tolerated the entire procedure well. A repeat set of vitals were taken after the procedure and the patient was kept under observation following institutional  policy, for this type of procedure. Post-procedural neurological assessment was performed, showing return to baseline, prior to discharge. The patient was provided with post-procedure discharge instructions, including a section on how to identify potential problems. Should any problems arise concerning this procedure, the patient was given instructions to immediately contact us, at any time, without hesitation. In any case, we plan to contact the patient by telephone for a follow-up status report regarding this interventional procedure.  Comments:  No additional relevant information.  Plan of Care  Orders:  No orders of the defined types were placed in this encounter.  Chronic Opioid Analgesic:  Hydrocodone/APAP 5/325, 1 tab PO q 8 hrs (15 mg/day of hydrocodone) (has enough until 06/11/2019) MME/day: 15 mg/day.   Medications ordered for procedure: No orders of the defined types were placed in this encounter.  Medications administered: Kaegan N. Sheard had no medications administered during this visit.  See the medical record for exact dosing, route, and time of administration.  Follow-up plan:   No follow-ups on file.       Considering: NOTE:NO Lumbar RFAuntil BMIis at or below 35. Diagnostic bilateral cervical facet nerve block Diagnostic bilateral cervical ESI Possible bilateral cervical facet RFA Diagnostic bilateral suprascapular NB Possible bilateral suprascapular nerve RFA Diagnostic bilateral LESI Possible bilateral lumbar facet RFA(NO Lumbar RFAuntil BMIis at or below 35.)   Palliative PRN treatment(s): Palliative bilateral IA Hyalgan knee injection S3N1(Fluoro required) Palliative bilateral IA knee joint (steroid) #2  Diagnostic/therapeutic bilateral knee genicular NB #2 Diagnostic/therapeutic left IA wrist injection (MNB/TPI) #2 Diagnostic bilateral IA shoulder injection #3 Diagnostic left suprascapular NB #2 Therapeutic left L4-5 LESI #4    Diagnostic right lumbar facet block #2    Recent Visits Date Type Provider Dept  06/25/19 Procedure visit Milinda Pointer, MD [facility]  06/03/19 Office Visit Milinda Pointer, MD [facility]  Showing recent visits within past 90 days and meeting all other requirements   Today's Visits Date Type Provider Dept  07/09/19 Appointment Milinda Pointer, MD [facility]  Showing today's visits and meeting all other requirements   Future Appointments No visits were found meeting these conditions.  Showing future appointments within next 90 days and meeting all other requirements   Disposition: Discharge home  Discharge Date & Time: 07/09/2019;   hrs.  Primary Care Physician: Lorelee Market, MD Location: [facility] Note by: Gaspar Cola, MD Date: 07/09/2019; Time: 6:29 AM  Disclaimer:  Medicine is not an Chief Strategy Officer. The only guarantee in medicine is that nothing is guaranteed. It is important to note that the decision to proceed with this intervention was based on the information collected from the patient. The Data and conclusions were drawn from the patient's questionnaire, the interview, and the physical examination. Because the information was provided in large part by the patient, it cannot be guaranteed that it has not been purposely or unconsciously manipulated. Every effort has been made to obtain as much relevant data as possible for this evaluation. It is important to note that the conclusions that lead to this procedure are derived in large part from the available data. Always take into account that the treatment will also be dependent on availability of resources and existing treatment guidelines, considered by other Pain Management Practitioners as being common knowledge and practice, at the time of the intervention. For Medico-Legal purposes, it is also important to point out that variation in  procedural techniques and pharmacological choices are the acceptable norm. The indications, contraindications, technique, and results of the above procedure should only be interpreted and judged by a Board-Certified Interventional Pain Specialist with extensive familiarity and expertise in the same exact procedure and technique.

## 2019-07-10 NOTE — Progress Notes (Signed)
Test: CRP (C-reactive protein) levels Finding(s): Elevated  Normal Level(s): Less than <1.0 mg/dL. Clinical significance: C-reactive protein (CRP) is produced by the liver. The level of CRP rises when there is inflammation throughout the body. CRP goes up in response to inflammation. High levels suggests the presence of chronic inflammation but do not identify its location or cause. Drops of previously elevated levels suggest that the inflammation or infection is subsiding and/or responding to treatment. Signs and symptoms may include: Signs or symptoms, if present, would depend on the underlying inflammatory condition that is the cause of the elevated CRP level. Possible causes:  - Most common: High levels have been observed in obese patients, individuals with bacterial infections, chronic inflammation, or flare-ups of inflammatory conditions. Patient Recommendation(s): Unless contraindicated, consider the use of anti-inflammatory diet and medications. (visit http://inflammationfactor.com/ ) ___________________________________________________________________________________

## 2019-07-10 NOTE — Progress Notes (Signed)
Test: Blood sugar (Glucose levels) Finding(s): High (hyperglycemia) Normal Level(s): Normal fasting (NPO x 8 hours) glucose levels are between 65-99 mg/dl, with 2 hour fasting, levels are usually less than 140 mg/dl. Clinical significance: Any random blood glucose level greater than 200 mg/dl is considered to be Diabetes. Signs and symptoms may include: (when persistently above 180 mg/dL) Increased thirst; headaches; trouble concentrating; blurred vision; frequent peeing (urination); fatigue (weakness and tired feeling); weight loss. The most common and classical symptoms of an undiagnosed diabetes with hyperglycemia are: Increased urinary frequency (polyuria), thirst (polydipsia), hunger (polyphagia), and unexplained weight loss; numbness in the extremities, pain in feet (dysesthesias), fatigue, and blurred vision; recurrent or severe infections; loss of consciousness or severe nausea/vomiting (ketoacidosis) or coma. Patient Recommendation(s): Fasting levels above 140 mg/dL or any levels above 200 mg/dL should follow-up with PCP (Primary Care Physician) for further evaluation. ___________________________________________________________________________________ Aspartate transaminase (AST) or aspartate aminotransferase: Also known as: serum glutamic oxaloacetic transaminase (SGOT) - Low levels of AST in the blood are expected and may be normal. AST levels are often compared with results of other tests such as alkaline phosphatase (ALP), total protein, and bilirubin to help determine which form of liver disease is present. AST is often measured to monitor treatment of persons with liver disease and may be ordered either by itself or along with other tests for this purpose. Sometimes AST may be used to monitor people who are taking medications that are potentially toxic to the liver. If AST levels increase, then the person may be switched to another medication. AST is compared directly to ALT and an  AST/ALT ratio is calculated. This ratio may be used to distinguish between different causes of liver damage and to distinguish liver injury from damage to heart or muscle. ___________________________________________________________________________________

## 2019-07-10 NOTE — Progress Notes (Signed)
Patient's Name: Lauren [name]  MRN: [id number]  Referring Provider: Lorelee Market, MD  DOB: 02-Jul-1976  PCP: Lorelee Market, MD  DOS: 07/11/2019  Note by: Gaspar Cola, MD  Service setting: Ambulatory outpatient  Specialty: Interventional Pain Management  Patient type: Established  Location: [facility] (AMB) Pain Management Facility  Visit type: Interventional Procedure   Primary Reason for Visit: Interventional Pain Management Treatment. CC: Back Pain (also, knee pain)  Procedure #1:  Anesthesia, Analgesia, Anxiolysis:  Type: Therapeutic Intra-Articular Hyalgan Knee Injection #S2N5  Region: Lateral infrapatellar Knee Region Level: Knee Joint Laterality: Bilateral  Type: Local Anesthesia Indication(s): Analgesia         Local Anesthetic: Lidocaine 1-2% Route: Infiltration (Kinder/IM) IV Access: Declined Sedation: Declined   Position: Sitting   Indications: 1. Osteoarthritis of knees (Bilateral)   2. Chronic knee pain (Primary Area of Pain) (Bilateral) (L>R)    Procedure #2:  Anesthesia, Analgesia, Anxiolysis:  Type: Lumbar Facet, Medial Branch Block(s) #R2/L1  Primary Purpose: Diagnostic Region: Posterolateral Lumbosacral Spine Level: L2, L3, L4, L5, & S1 Medial Branch Level(s). Injecting these levels blocks the L3-4, L4-5, and L5-S1 lumbar facet joints. Laterality: Bilateral  Type: Moderate (Conscious) Sedation combined with Local Anesthesia Indication(s): Analgesia and Anxiety Route: Intravenous (IV) IV Access: Secured Sedation: Meaningful verbal contact was maintained at all times during the procedure  Local Anesthetic: Lidocaine 1-2%  Position: Prone   Indications: 1. Lumbar facet syndrome (Bilateral) (R>L)   2. Spondylosis without myelopathy or radiculopathy, lumbosacral region   3. Chronic low back pain (Fifth Area of Pain) (Bilateral) (R>L) w/o sciatica   4. DDD (degenerative disc disease), lumbar    Pain Score: Pre-procedure: 7 /10 Post-procedure: 0-No  pain(left leg is 1)/10   Pre-op Assessment:  Lauren [name] is a 43 y.o. (year old), female patient, seen today for interventional treatment. She  has a past surgical history that includes Rotator cuff repair; Knee surgery; Cholecystectomy; Cesarean section (2010); Cesarean section (2011); Tonsillectomy; Cardiac electrophysiology study and ablation (01/18/2018); and Excision mass neck (Left, 10/11/2018). Lauren [name] has a current medication list which includes the following prescription(s): albuterol, aspirin ec, beclomethasone, diltiazem, esomeprazole, fluticasone, furosemide, hydrocodone-acetaminophen, hydrocodone-acetaminophen, hydrocodone-acetaminophen, ibuprofen, losartan-hydrochlorothiazide, medroxyprogesterone acetate, metoprolol succinate, pregabalin, and stiolto respimat, and the following Facility-Administered Medications: fentanyl and midazolam. Her primarily concern today is the Back Pain (also, knee pain)  Initial Vital Signs:  Pulse/HCG Rate: 84ECG Heart Rate: 83 Temp: 98.1 F (36.7 C) Resp: 17 BP: 128/84 SpO2: 100 %  BMI: Estimated body mass index is 51.15 kg/m as calculated from the following:   Height as of this encounter: 5\' 4"  (1.626 m).   Weight as of this encounter: 298 lb (135.2 kg).  Risk Assessment: Allergies: Reviewed. She is allergic to nucynta [tapentadol] and tape.  Allergy Precautions: None required Coagulopathies: Reviewed. None identified.  Blood-thinner therapy: None at this time Active Infection(s): Reviewed. None identified. Lauren [name] is afebrile  Site Confirmation: Lauren [name] was asked to confirm the procedure and laterality before marking the site Procedure checklist: Completed Consent: Before the procedure and under the influence of no sedative(s), amnesic(s), or anxiolytics, the patient was informed of the treatment options, risks and possible complications. To fulfill our ethical and legal obligations, as recommended by the American Medical  Association's Code of Ethics, I have informed the patient of my clinical impression; the nature and purpose of the treatment or procedure; the risks, benefits, and possible complications of the intervention; the alternatives, including doing nothing; the risk(s) and benefit(s) of the  alternative treatment(s) or procedure(s); and the risk(s) and benefit(s) of doing nothing. The patient was provided information about the general risks and possible complications associated with the procedure. These may include, but are not limited to: failure to achieve desired goals, infection, bleeding, organ or nerve damage, allergic reactions, paralysis, and death. In addition, the patient was informed of those risks and complications associated to the procedure, such as failure to decrease pain; infection; bleeding; organ or nerve damage with subsequent damage to sensory, motor, and/or autonomic systems, resulting in permanent pain, numbness, and/or weakness of one or several areas of the body; allergic reactions; (i.e.: anaphylactic reaction); and/or death. Furthermore, the patient was informed of those risks and complications associated with the medications. These include, but are not limited to: allergic reactions (i.e.: anaphylactic or anaphylactoid reaction(s)); adrenal axis suppression; blood sugar elevation that in diabetics may result in ketoacidosis or comma; water retention that in patients with history of congestive heart failure may result in shortness of breath, pulmonary edema, and decompensation with resultant heart failure; weight gain; swelling or edema; medication-induced neural toxicity; particulate matter embolism and blood vessel occlusion with resultant organ, and/or nervous system infarction; and/or aseptic necrosis of one or more joints. Finally, the patient was informed that Medicine is not an exact science; therefore, there is also the possibility of unforeseen or unpredictable risks and/or possible  complications that may result in a catastrophic outcome. The patient indicated having understood very clearly. We have given the patient no guarantees and we have made no promises. Enough time was given to the patient to ask questions, all of which were answered to the patient's satisfaction. Lauren [name] has indicated that she wanted to continue with the procedure. Attestation: I, the ordering provider, attest that I have discussed with the patient the benefits, risks, side-effects, alternatives, likelihood of achieving goals, and potential problems during recovery for the procedure that I have provided informed consent. Date   Time: 07/11/2019  8:08 AM  Pre-Procedure Preparation:  Monitoring: As per clinic protocol. Respiration, ETCO2, SpO2, BP, heart rate and rhythm monitor placed and checked for adequate function Safety Precautions: Patient was assessed for positional comfort and pressure points before starting the procedure. Time-out: I initiated and conducted the "Time-out" before starting the procedure, as per protocol. The patient was asked to participate by confirming the accuracy of the "Time Out" information. Verification of the correct person, site, and procedure were performed and confirmed by me, the nursing staff, and the patient. "Time-out" conducted as per Joint Commission's Universal Protocol (UP.01.01.01). Time: 90  Description of Procedure #1:  Target Area: Knee Joint Approach: Just above the Lateral tibial plateau, lateral to the infrapatellar tendon. Area Prepped: Entire knee area, from the mid-thigh to the mid-shin. Prepping solution: DuraPrep (Iodine Povacrylex [0.7% available iodine] and Isopropyl Alcohol, 74% w/w) Safety Precautions: Aspiration looking for blood return was conducted prior to all injections. At no point did we inject any substances, as a needle was being advanced. No attempts were made at seeking any paresthesias. Safe injection practices and needle  disposal techniques used. Medications properly checked for expiration dates. SDV (single dose vial) medications used. Description of the Procedure: Protocol guidelines were followed. The patient was placed in position over the fluoroscopy table. The target area was identified and the area prepped in the usual manner. Skin & deeper tissues infiltrated with local anesthetic. Appropriate amount of time allowed to pass for local anesthetics to take effect. The procedure needles were then advanced to the target area.  Proper needle placement secured. Negative aspiration confirmed. Solution injected in intermittent fashion, asking for systemic symptoms every 0.5cc of injectate. The needles were then removed and the area cleansed, making sure to leave some of the prepping solution back to take advantage of its long term bactericidal properties. Vitals:   07/11/19 0910 07/11/19 0918 07/11/19 0928 07/11/19 0938  BP: (!) 156/99 (!) 142/81 (!) 146/79 (!) 144/80  Pulse:      Resp: 20 (!) 21 19 19   Temp:  97.8 F (36.6 C)  98 F (36.7 C)  TempSrc:  Oral    SpO2: 100% 100% 100% 100%  Weight:      Height:        Start Time: 0840 hrs. End Time: 0910 hrs. Materials:  Needle(s) Type: Regular needle Gauge: 22G Length: 3.5-in Medication(s): Please see orders for medications and dosing details.  Imaging Guidance for procedure #1:  Type of Imaging Technique: Fluoroscopy Guidance (Non-spinal) Indication(s): Morbid obesity. Assistance in needle guidance and placement for procedures requiring needle placement in or near specific anatomical locations impossible to access without such assistance. Exposure Time: Please see nurses notes. Contrast: None used. Fluoroscopic Guidance: I was personally present during the use of fluoroscopy. "Tunnel Vision Technique" used to obtain the best possible view of the target area. Parallax error corrected before commencing the procedure. "Direction-depth-direction" technique used  to introduce the needle under continuous pulsed fluoroscopy. Once target was reached, antero-posterior, oblique, and lateral fluoroscopic projection used confirm needle placement in all planes. Images permanently stored in EMR. Ultrasound Guidance: N/A Interpretation: No contrast injected. I personally interpreted the imaging intraoperatively. Adequate needle placement confirmed in multiple planes. Permanent images saved into the patient's record.  Description of Procedure #2:  Laterality: Bilateral. The procedure was performed in identical fashion on both sides. Levels:  L2, L3, L4, L5, & S1 Medial Branch Level(s) Area Prepped: Posterior Lumbosacral Region Prepping solution: DuraPrep (Iodine Povacrylex [0.7% available iodine] and Isopropyl Alcohol, 74% w/w) Safety Precautions: Aspiration looking for blood return was conducted prior to all injections. At no point did we inject any substances, as a needle was being advanced. Before injecting, the patient was told to immediately notify me if she was experiencing any new onset of "ringing in the ears, or metallic taste in the mouth". No attempts were made at seeking any paresthesias. Safe injection practices and needle disposal techniques used. Medications properly checked for expiration dates. SDV (single dose vial) medications used. After the completion of the procedure, all disposable equipment used was discarded in the proper designated medical waste containers. Local Anesthesia: Protocol guidelines were followed. The patient was positioned over the fluoroscopy table. The area was prepped in the usual manner. The time-out was completed. The target area was identified using fluoroscopy. A 12-in long, straight, sterile hemostat was used with fluoroscopic guidance to locate the targets for each level blocked. Once located, the skin was marked with an approved surgical skin marker. Once all sites were marked, the skin (epidermis, dermis, and hypodermis), as  well as deeper tissues (fat, connective tissue and muscle) were infiltrated with a small amount of a short-acting local anesthetic, loaded on a 10cc syringe with a 25G, 1.5-in  Needle. An appropriate amount of time was allowed for local anesthetics to take effect before proceeding to the next step. Local Anesthetic: Lidocaine 2.0% The unused portion of the local anesthetic was discarded in the proper designated containers. Technical explanation of process:  L2 Medial Branch Nerve Block (MBB): The target area for the  L2 medial branch is at the junction of the postero-lateral aspect of the superior articular process and the superior, posterior, and medial edge of the transverse process of L3. Under fluoroscopic guidance, a Quincke needle was inserted until contact was made with os over the superior postero-lateral aspect of the pedicular shadow (target area). After negative aspiration for blood, 0.5 mL of the nerve block solution was injected without difficulty or complication. The needle was removed intact. L3 Medial Branch Nerve Block (MBB): The target area for the L3 medial branch is at the junction of the postero-lateral aspect of the superior articular process and the superior, posterior, and medial edge of the transverse process of L4. Under fluoroscopic guidance, a Quincke needle was inserted until contact was made with os over the superior postero-lateral aspect of the pedicular shadow (target area). After negative aspiration for blood, 0.5 mL of the nerve block solution was injected without difficulty or complication. The needle was removed intact. L4 Medial Branch Nerve Block (MBB): The target area for the L4 medial branch is at the junction of the postero-lateral aspect of the superior articular process and the superior, posterior, and medial edge of the transverse process of L5. Under fluoroscopic guidance, a Quincke needle was inserted until contact was made with os over the superior postero-lateral  aspect of the pedicular shadow (target area). After negative aspiration for blood, 0.5 mL of the nerve block solution was injected without difficulty or complication. The needle was removed intact. L5 Medial Branch Nerve Block (MBB): The target area for the L5 medial branch is at the junction of the postero-lateral aspect of the superior articular process and the superior, posterior, and medial edge of the sacral ala. Under fluoroscopic guidance, a Quincke needle was inserted until contact was made with os over the superior postero-lateral aspect of the pedicular shadow (target area). After negative aspiration for blood, 0.5 mL of the nerve block solution was injected without difficulty or complication. The needle was removed intact. S1 Medial Branch Nerve Block (MBB): The target area for the S1 medial branch is at the posterior and inferior 6 o'clock position of the L5-S1 facet joint. Under fluoroscopic guidance, the Quincke needle inserted for the L5 MBB was redirected until contact was made with os over the inferior and postero aspect of the sacrum, at the 6 o' clock position under the L5-S1 facet joint (Target area). After negative aspiration for blood, 0.5 mL of the nerve block solution was injected without difficulty or complication. The needle was removed intact.  Nerve block solution: 0.2% PF-Ropivacaine + Triamcinolone (40 mg/mL) diluted to a final concentration of 4 mg of Triamcinolone/mL of Ropivacaine The unused portion of the solution was discarded in the proper designated containers. Procedural Needles: 22-gauge, 3.5-inch, Quincke needles used for all levels.  Once the entire procedure was completed, the treated area was cleaned, making sure to leave some of the prepping solution back to take advantage of its long term bactericidal properties.   Illustration of the posterior view of the lumbar spine and the posterior neural structures. Laminae of L2 through S1 are labeled. DPRL5, dorsal  primary ramus of L5; DPRS1, dorsal primary ramus of S1; DPR3, dorsal primary ramus of L3; FJ, facet (zygapophyseal) joint L3-L4; I, inferior articular process of L4; LB1, lateral branch of dorsal primary ramus of L1; IAB, inferior articular branches from L3 medial branch (supplies L4-L5 facet joint); IBP, intermediate branch plexus; MB3, medial branch of dorsal primary ramus of L3; NR3, third lumbar nerve root;  S, superior articular process of L5; SAB, superior articular branches from L4 (supplies L4-5 facet joint also); TP3, transverse process of L3.  Vitals:   07/11/19 0910 07/11/19 0918 07/11/19 0928 07/11/19 0938  BP: (!) 156/99 (!) 142/81 (!) 146/79 (!) 144/80  Pulse:      Resp: 20 (!) 21 19 19   Temp:  97.8 F (36.6 C)  98 F (36.7 C)  TempSrc:  Oral    SpO2: 100% 100% 100% 100%  Weight:      Height:         Start Time: 0840 hrs. End Time: 0910 hrs.  Imaging Guidance (Spinal) for procedure #2:  Type of Imaging Technique: Fluoroscopy Guidance (Spinal) Indication(s): Assistance in needle guidance and placement for procedures requiring needle placement in or near specific anatomical locations not easily accessible without such assistance. Exposure Time: Please see nurses notes. Contrast: None used. Fluoroscopic Guidance: I was personally present during the use of fluoroscopy. "Tunnel Vision Technique" used to obtain the best possible view of the target area. Parallax error corrected before commencing the procedure. "Direction-depth-direction" technique used to introduce the needle under continuous pulsed fluoroscopy. Once target was reached, antero-posterior, oblique, and lateral fluoroscopic projection used confirm needle placement in all planes. Images permanently stored in EMR. Interpretation: No contrast injected. I personally interpreted the imaging intraoperatively. Adequate needle placement confirmed in multiple planes. Permanent images saved into the patient's record.  Antibiotic  Prophylaxis:   Anti-infectives (From admission, onward)   None     Indication(s): None identified  Post-operative Assessment:  Post-procedure Vital Signs:  Pulse/HCG Rate: 8480 Temp: 98 F (36.7 C) Resp: 19 BP: (!) 144/80 SpO2: 100 %  EBL: None  Complications: No immediate post-treatment complications observed by team, or reported by patient.  Note: The patient tolerated the entire procedure well. A repeat set of vitals were taken after the procedure and the patient was kept under observation following institutional policy, for this type of procedure. Post-procedural neurological assessment was performed, showing return to baseline, prior to discharge. The patient was provided with post-procedure discharge instructions, including a section on how to identify potential problems. Should any problems arise concerning this procedure, the patient was given instructions to immediately contact us, at any time, without hesitation. In any case, we plan to contact the patient by telephone for a follow-up status report regarding this interventional procedure.  Comments:  No additional relevant information.  Plan of Care  Orders:  Orders Placed This Encounter  Procedures   KNEE INJECTION    Hyalgan knee injection to be done by MD.    Scheduling Instructions:     Procedure: Intra-articular Hyalgan Knee injection #5     Side(s): Bilateral Knee     Sedation: None     Timeframe: Today    Order Specific Question:   Where will this procedure be performed?    Answer:   [facility]   LUMBAR FACET(MEDIAL BRANCH NERVE BLOCK) MBNB    Scheduling Instructions:     Procedure: Lumbar facet block (AKA.: Lumbosacral medial branch nerve block)     Side: Bilateral     Level: L3-4, L4-5, & L5-S1 Facets (L2, L3, L4, L5, & S1 Medial Branch Nerves)     Sedation: Patient's choice.     Timeframe: Today    Order Specific Question:   Where will this procedure be performed?    Answer:   [facility]   DG PAIN CLINIC C-ARM 1-60 MIN NO REPORT  Intraoperative interpretation by procedural physician at [facility].    Standing Status:   Standing    Number of Occurrences:   1    Order Specific Question:   Reason for exam:    Answer:   Assistance in needle guidance and placement for procedures requiring needle placement in or near specific anatomical locations not easily accessible without such assistance.   Informed Consent Details: Physician/Practitioner Attestation; Transcribe to consent form and obtain patient signature    Provider Attestation: I, [place] Dossie Arbour, MD, (Pain Management Specialist), the physician/practitioner, attest that I have discussed with the patient the benefits, risks, side effects, alternatives, likelihood of achieving goals and potential problems during recovery for the procedure that I have provided informed consent.    Scheduling Instructions:     Procedure: Therapeutic, bilateral, intra-articular Hyalgan knee injection     Indications: Chronic bilateral knee pain secondary to primary osteoarthritis of the knee     Note: Always confirm laterality of pain with Lauren [name], before procedure.     Transcribe to consent form and obtain patient signature.   Informed Consent Details: Physician/Practitioner Attestation; Transcribe to consent form and obtain patient signature    Nursing Order: Transcribe to consent form and obtain patient signature. Note: Always confirm laterality of pain with Lauren [name], before procedure. Procedure: Lumbar Facet Block  under fluoroscopic guidance Indication/Reason: Low Back Pain, with our without leg pain, due to Facet Joint Arthralgia (Joint Pain) known as Lumbar Facet Syndrome, secondary to Lumbar, and/or Lumbosacral Spondylosis (Arthritis of the Spine), without myelopathy or radiculopathy (Nerve Damage). Provider Attestation: I, [place] Dossie Arbour, MD, (Pain Management Specialist), the  physician/practitioner, attest that I have discussed with the patient the benefits, risks, side effects, alternatives, likelihood of achieving goals and potential problems during recovery for the procedure that I have provided informed consent.   Provide equipment / supplies at bedside    Equipment required: Single use, disposable, "Block Tray"    Standing Status:   Standing    Number of Occurrences:   1    Order Specific Question:   Specify    Answer:   Block Tray   Chronic Opioid Analgesic:  Hydrocodone/APAP 5/325, 1 tab PO q 8 hrs (15 mg/day of hydrocodone) (has enough until 06/11/2019) MME/day: 15 mg/day.   Medications ordered for procedure: Meds ordered this encounter  Medications   lidocaine (PF) (XYLOCAINE) 1 % injection 5 mL   ropivacaine (PF) 2 mg/mL (0.2%) (NAROPIN) injection 5 mL   Sodium Hyaluronate SOSY 2 mL   Sodium Hyaluronate SOSY 2 mL   lidocaine (XYLOCAINE) 2 % (with pres) injection 400 mg   lactated ringers infusion 1,000 mL   midazolam (VERSED) 5 MG/5ML injection 1-2 mg    Make sure Flumazenil is available in the pyxis when using this medication. If oversedation occurs, administer 0.2 mg IV over 15 sec. If after 45 sec no response, administer 0.2 mg again over 1 min; may repeat at 1 min intervals; not to exceed 4 doses (1 mg)   fentaNYL (SUBLIMAZE) injection 25-50 mcg    Make sure Narcan is available in the pyxis when using this medication. In the event of respiratory depression (RR< 8/min): Titrate NARCAN (naloxone) in increments of 0.1 to 0.2 mg IV at 2-3 minute intervals, until desired degree of reversal.   ropivacaine (PF) 2 mg/mL (0.2%) (NAROPIN) injection 18 mL   triamcinolone acetonide (KENALOG-40) injection 80 mg   Medications administered: We administered lidocaine (PF), ropivacaine (PF) 2 mg/mL (0.2%),  Sodium Hyaluronate, Sodium Hyaluronate, lidocaine, lactated ringers, midazolam, fentaNYL, ropivacaine (PF) 2 mg/mL (0.2%), and triamcinolone  acetonide.  See the medical record for exact dosing, route, and time of administration.  Follow-up plan:   Return in about 2 weeks (around 07/25/2019) for (VV), (PP).       Considering: NOTE:NO Lumbar RFAuntil BMIis at or below 35. Diagnostic bilateral cervical facet nerve block Diagnostic bilateral cervical ESI Possible bilateral cervical facet RFA Diagnostic bilateral suprascapular NB Possible bilateral suprascapular nerve RFA Diagnostic bilateral LESI Possible bilateral lumbar facet RFA(NO Lumbar RFAuntil BMIis at or below 35.)   Palliative PRN treatment(s): Palliative bilateral IA Hyalgan knee injection S3N1(Fluoro required) Palliative bilateral IA knee joint (steroid) #2  Diagnostic/therapeutic bilateral knee genicular NB #2 Diagnostic/therapeutic left IA wrist injection (MNB/TPI) #2 Diagnostic bilateral IA shoulder injection #3 Diagnostic left suprascapular NB #2 Therapeutic left L4-5 LESI #4  Diagnostic right lumbar facet block #2    Recent Visits Date Type Provider Dept  06/25/19 Procedure visit Milinda Pointer, MD [facility]  06/03/19 Office Visit Milinda Pointer, MD [facility]  Showing recent visits within past 90 days and meeting all other requirements   Today's Visits Date Type Provider Dept  07/11/19 Procedure visit Milinda Pointer, MD [facility]  Showing today's visits and meeting all other requirements   Future Appointments Date Type Provider Dept  07/29/19 Appointment Milinda Pointer, MD [facility]  Showing future appointments within next 90 days and meeting all other requirements   Disposition: Discharge home  Discharge Date & Time: 07/11/2019; 0944 hrs.   Primary Care Physician: Lorelee Market, MD Location: [facility] Note by: Gaspar Cola, MD Date: 07/11/2019; Time: 10:30 AM  Disclaimer:  Medicine is not an exact science. The only  guarantee in medicine is that nothing is guaranteed. It is important to note that the decision to proceed with this intervention was based on the information collected from the patient. The Data and conclusions were drawn from the patient's questionnaire, the interview, and the physical examination. Because the information was provided in large part by the patient, it cannot be guaranteed that it has not been purposely or unconsciously manipulated. Every effort has been made to obtain as much relevant data as possible for this evaluation. It is important to note that the conclusions that lead to this procedure are derived in large part from the available data. Always take into account that the treatment will also be dependent on availability of resources and existing treatment guidelines, considered by other Pain Management Practitioners as being common knowledge and practice, at the time of the intervention. For Medico-Legal purposes, it is also important to point out that variation in procedural techniques and pharmacological choices are the acceptable norm. The indications, contraindications, technique, and results of the above procedure should only be interpreted and judged by a Board-Certified Interventional Pain Specialist with extensive familiarity and expertise in the same exact procedure and technique.

## 2019-07-10 NOTE — Progress Notes (Signed)
Test: ESR (Erythrocyte Sedimentation Rate) levels Finding(s): Elevated  Normal Level(s): below 30 mm/hr. Clinical significance: The sed rate is an acute phase reactant that indirectly measures the degree of inflammation present in the body. It can be acute, developing rapidly after trauma, injury or infection, for example, or can occur over an extended time (chronic) with conditions such as autoimmune diseases or cancer. The ESR is not diagnostic; it is a non-specific, screening test that may be elevated in a number of these different conditions. It provides general information about the presence or absence of an inflammatory condition. Signs and symptoms may include: None Possible causes:  - Most common: inflammation Patient Recommendation(s): Unless contraindicated, consider the use of anti-inflammatory diet and medications. (visit http://inflammationfactor.com/ ) ___________________________________________________________________________________ - The combined elevation of the ESR & CRP, may be suggestive of an autoimmune disease. Patients with elevated levels of both should consider an evaluation by a Rheumatologist. ___________________________________________________________________________________

## 2019-07-10 NOTE — Progress Notes (Signed)

## 2019-07-10 NOTE — Patient Instructions (Signed)

## 2019-07-11 ENCOUNTER — Other Ambulatory Visit: Payer: Self-pay | Admitting: Pain Medicine

## 2019-07-11 ENCOUNTER — Encounter: Payer: Self-pay | Admitting: Pain Medicine

## 2019-07-11 ENCOUNTER — Ambulatory Visit
Admission: RE | Admit: 2019-07-11 | Discharge: 2019-07-11 | Disposition: A | Discharge status: Home / Self Care | Payer: Medicaid Other | Source: Ambulatory Visit | Attending: Pain Medicine | Admitting: Pain Medicine

## 2019-07-11 ENCOUNTER — Ambulatory Visit (HOSPITAL_BASED_OUTPATIENT_CLINIC_OR_DEPARTMENT_OTHER): Payer: Medicaid Other | Admitting: Pain Medicine

## 2019-07-11 ENCOUNTER — Telehealth: Payer: Self-pay | Admitting: Pain Medicine

## 2019-07-11 ENCOUNTER — Other Ambulatory Visit: Payer: Self-pay

## 2019-07-11 VITALS — BP 144/80 | HR 84 | Temp 98.0°F | Resp 19 | Ht 64.0 in | Wt 298.0 lb

## 2019-07-11 DIAGNOSIS — M25561 Pain in right knee: Secondary | ICD-10-CM | POA: Insufficient documentation

## 2019-07-11 DIAGNOSIS — M5136 Other intervertebral disc degeneration, lumbar region: Secondary | ICD-10-CM | POA: Diagnosis present

## 2019-07-11 DIAGNOSIS — M47816 Spondylosis without myelopathy or radiculopathy, lumbar region: Secondary | ICD-10-CM | POA: Diagnosis present

## 2019-07-11 DIAGNOSIS — M545 Low back pain: Secondary | ICD-10-CM | POA: Insufficient documentation

## 2019-07-11 DIAGNOSIS — G8929 Other chronic pain: Secondary | ICD-10-CM | POA: Insufficient documentation

## 2019-07-11 DIAGNOSIS — M792 Neuralgia and neuritis, unspecified: Secondary | ICD-10-CM

## 2019-07-11 DIAGNOSIS — M47817 Spondylosis without myelopathy or radiculopathy, lumbosacral region: Secondary | ICD-10-CM | POA: Insufficient documentation

## 2019-07-11 DIAGNOSIS — M25562 Pain in left knee: Secondary | ICD-10-CM

## 2019-07-11 DIAGNOSIS — M170 Bilateral primary osteoarthritis of knee: Secondary | ICD-10-CM | POA: Insufficient documentation

## 2019-07-11 DIAGNOSIS — M797 Fibromyalgia: Secondary | ICD-10-CM

## 2019-07-11 MED ORDER — SODIUM HYALURONATE (VISCOSUP) 20 MG/2ML IX SOSY
2.0000 mL | PREFILLED_SYRINGE | Freq: Once | INTRA_ARTICULAR | Status: AC
  Administered 2019-07-11: 2 mL via INTRA_ARTICULAR

## 2019-07-11 MED ORDER — TRIAMCINOLONE ACETONIDE 40 MG/ML IJ SUSP
80.0000 mg | Freq: Once | INTRAMUSCULAR | Status: AC
  Administered 2019-07-11: 80 mg
  Filled 2019-07-11: qty 2

## 2019-07-11 MED ORDER — FENTANYL CITRATE (PF) 100 MCG/2ML IJ SOLN
25.0000 ug | INTRAMUSCULAR | Status: DC | PRN
  Administered 2019-07-11: 100 ug via INTRAVENOUS
  Filled 2019-07-11: qty 2

## 2019-07-11 MED ORDER — MIDAZOLAM HCL 5 MG/5ML IJ SOLN
1.0000 mg | INTRAMUSCULAR | Status: DC | PRN
  Administered 2019-07-11: 2 mg via INTRAVENOUS
  Filled 2019-07-11: qty 5

## 2019-07-11 MED ORDER — LIDOCAINE HCL (PF) 1 % IJ SOLN
5.0000 mL | Freq: Once | INTRAMUSCULAR | Status: AC
  Administered 2019-07-11: 5 mL
  Filled 2019-07-11: qty 5

## 2019-07-11 MED ORDER — ROPIVACAINE HCL 2 MG/ML IJ SOLN
5.0000 mL | Freq: Once | INTRAMUSCULAR | Status: AC
  Administered 2019-07-11: 5 mL via INTRA_ARTICULAR
  Filled 2019-07-11: qty 10

## 2019-07-11 MED ORDER — ROPIVACAINE HCL 2 MG/ML IJ SOLN
18.0000 mL | Freq: Once | INTRAMUSCULAR | Status: AC
  Administered 2019-07-11: 18 mL via PERINEURAL
  Filled 2019-07-11: qty 20

## 2019-07-11 MED ORDER — LIDOCAINE HCL 2 % IJ SOLN
20.0000 mL | Freq: Once | INTRAMUSCULAR | Status: AC
  Administered 2019-07-11: 400 mg
  Filled 2019-07-11: qty 40

## 2019-07-11 MED ORDER — LACTATED RINGERS IV SOLN
1000.0000 mL | Freq: Once | INTRAVENOUS | Status: AC
  Administered 2019-07-11: 1000 mL via INTRAVENOUS

## 2019-07-11 MED ORDER — SODIUM HYALURONATE (VISCOSUP) 20 MG/2ML IX SOSY
2.0000 mL | PREFILLED_SYRINGE | Freq: Once | INTRA_ARTICULAR | Status: AC
  Administered 2019-07-11: 09:00:00 2 mL via INTRA_ARTICULAR

## 2019-07-11 NOTE — Telephone Encounter (Signed)
Patient states [employer] said meds need prior auth.

## 2019-07-11 NOTE — Progress Notes (Signed)
Safety precautions to be maintained throughout the outpatient stay will include: orient to surroundings, keep bed in low position, maintain call bell within reach at all times, provide assistance with transfer out of bed and ambulation.  

## 2019-07-12 ENCOUNTER — Telehealth: Payer: Self-pay | Admitting: *Deleted

## 2019-07-12 NOTE — Telephone Encounter (Signed)
No problems post procedure. 

## 2019-07-25 ENCOUNTER — Encounter: Payer: Self-pay | Admitting: Pain Medicine

## 2019-07-25 NOTE — Progress Notes (Signed)
Pain relief after procedure (treated area only): (Questions asked to patient) BLF 1. Starting about 15 minutes after the procedure, and "while the area was still numb" (from the local anesthetics), were you having any of your usual pain "in that area"? (NOT including the discomfort from the needle sticks.) First 1 hour: 100 % better. First 4-6 hours: 100 % better. 2. Assuming that it did get numb. How long was the area numb? 100 % benefit, longer than 6 hours. How long? 8hrs. 3. How much better is your pain now, when compared to before the procedure? Current benefit: 60% better. 4. Can you move better now? Improvement in ROM (Range of Motion): Yes. 5. Can you do more now? Improvement in function: Yes. 4. Did you have any problems with the procedure? Side-effects/Complications: No   Bilateral knee injections Right knee  100% 1st hour                     100%  4-6 hrs                      50%  Overall Left knee    100%  1st hour                     50%  4-6 hr                     0% overall

## 2019-07-28 NOTE — Progress Notes (Signed)
Pain Management Virtual Encounter Note - Virtual Visit via Telephone Telehealth (real-time audio visits between healthcare provider and patient).   Patient's Phone No. & Preferred Pharmacy:  [phone] (home); [phone] (mobile); (Preferred) [phone] [email or web address]  [place] ([address]), [place] [facility] [address]) [place] [address] Phone: [phone] Fax: [phone]    Pre-screening note:  Our staff contacted Ms. Mossbarger and offered her an "in person", "face-to-face" appointment versus a telephone encounter. She indicated preferring the telephone encounter, at this time.   Reason for Virtual Visit: COVID-19*  Social distancing based on CDC and AMA recommendations.   I contacted Dorethia N Genrich on 07/29/2019 via telephone.      I clearly identified myself as Gaspar Cola, MD. I verified that I was speaking with the correct person using two identifiers (Name: Pandora Balcazar Kirchoff, and date of birth: [birth year]).  Advanced Informed Consent I sought verbal advanced consent from [place] for virtual visit interactions. I informed Ms. Beaubrun of possible security and privacy concerns, risks, and limitations associated with providing "not-in-person" medical evaluation and management services. I also informed Ms. Tates of the availability of "in-person" appointments. Finally, I informed her that there would be a charge for the virtual visit and that she could be  personally, fully or partially, financially responsible for it. Ms. Claxton expressed understanding and agreed to proceed.   Historic Elements   Ms. Nashonda N Begin is a 43 y.o. year old, female patient evaluated today after her last encounter by our practice on 07/12/2019. Ms. Ferrelli  has a past medical history of Acute shoulder pain (Left) (12/26/2018), Advanced maternal age in multigravida, Asthma, Depression, GERD (gastroesophageal  reflux disease), Grand multiparity, Headache, History of carpal tunnel surgery of left wrist (05/2018), Hypertension, Leg fracture, Obesity, morbid, BMI 40.0-49.9 ([place]), Post traumatic stress disorder, Pre-diabetes, Pre-eclampsia, SVT (supraventricular tachycardia) ([place]), and Wrist fracture, closed. She also  has a past surgical history that includes Rotator cuff repair; Knee surgery; Cholecystectomy; Cesarean section (2010); Cesarean section (2011); Tonsillectomy; Cardiac electrophysiology study and ablation (01/18/2018); and Excision mass neck (Left, 10/11/2018). Ms. Battles has a current medication list which includes the following prescription(s): albuterol, aspirin ec, diltiazem, esomeprazole, furosemide, hydrocodone-acetaminophen, hydrocodone-acetaminophen, hydrocodone-acetaminophen, hydrocodone-acetaminophen, ibuprofen, losartan-hydrochlorothiazide, medroxyprogesterone acetate, metoprolol succinate, stiolto respimat, fluticasone, pregabalin, pregabalin, pregabalin, and pregabalin. She  reports that she has never smoked. She has never used smokeless tobacco. She reports that she does not drink alcohol or use drugs. Ms. Reasner is allergic to nucynta [tapentadol] and tape.   HPI  Today, she is being contacted for both, medication management and a post-procedure assessment.  During today's appointment we also reviewed the patient's Lyrica titration.  I have recommended to the patient to hold on on any further injections until at least February.  I have also recommended that she concentrate more on her weight and bringing this down.  The orthopedic surgeon that evaluated her case in [place] told her that she would need to lose about 200 pounds before he could go in and do a knee replacement.  Apparently she was told by the [facility] orthopedic department that they can probably go in and cleaned the meniscus and straighten out the knee first, in order to avoid the replacement and then if it does  become necessary, they could do the replacement but they would need for her to lose at least 65 pounds.  She indicated that she is working on this and she is about 6  pounds short of their initial request at goal.  She indicates not having attained any significant benefit on the left knee after the intra-articular injections with Hyalgan but she did get some benefit from the genicular nerve block and therefore this may be an option to treat left knee pain should he continue to give her problems.  She denies any problems with the Lyrica and therefore we will continue to titrate the medication up as tolerated until she either gets good relief for we get to 450 mg/day.  She indicates that at the lower dose she is not sure if it was the nerve blocks or the Lyrica, but she is no longer experiencing the pain going down the back of her leg.  She is encouraged with this and she has agreed to continue increasing the Lyrica.  She was asked to give me a call if by any chance she has any problems during this titration.  Post-Procedure Evaluation  Procedure: Therapeutic bilateral IA Hyalgan Knee Injection #S2N5  + diagnostic bilateral lumbar facet block #R2/L1  under fluoroscopic guidance and IV sedation Pre-procedure pain level:  7/10 Post-procedure: 0/10 (100% relief)  Sedation: Sedation provided.  Dewayne Shorter, RN  07/25/2019  2:49 PM  Signed Pain relief after procedure (treated area only): (Questions asked to patient) BLF 1. Starting about 15 minutes after the procedure, and "while the area was still numb" (from the local anesthetics), were you having any of your usual pain "in that area"? (NOT including the discomfort from the needle sticks.) First 1 hour: 100 % better. First 4-6 hours: 100 % better. 2. Assuming that it did get numb. How long was the area numb? 100 % benefit, longer than 6 hours. How long? 8hrs. 3. How much better is your pain now, when compared to before the procedure? Current benefit:  60% better. 4. Can you move better now? Improvement in ROM (Range of Motion): Yes. 5. Can you do more now? Improvement in function: Yes. 4. Did you have any problems with the procedure? Side-effects/Complications: No   Bilateral knee injections Right knee  100% 1st hour                     100%  4-6 hrs                      60%  Overall Left knee    100%  1st hour                     50%  4-6 hr                     0% overall    Current benefits: Defined as benefit that persist at this time.   Analgesia:  See above Function: Ms. Laser reports improvement in function ROM: Ms. Hendricksen reports improvement in ROM  Pharmacotherapy Assessment  Analgesic: Hydrocodone/APAP 5/325, 1 tab PO q 8 hrs (15 mg/day of hydrocodone) (has enough until 06/11/2019) MME/day: 15 mg/day.   Monitoring: Pharmacotherapy: No side-effects or adverse reactions reported.  PMP: PDMP reviewed during this encounter.       Compliance: No problems identified. Effectiveness: Clinically acceptable. Plan: Refer to "POC".  UDS:  Summary  Date Value Ref Range Status  06/27/2019 Note  Final    Comment:    ==================================================================== [employer] Select 13 (MW) ==================================================================== Test  Result       Flag       Units Drug Present and Declared for Prescription Verification   Hydrocodone                    707          EXPECTED   ng/mg creat   Hydromorphone                  70           EXPECTED   ng/mg creat   Dihydrocodeine                 88           EXPECTED   ng/mg creat   Norhydrocodone                 963          EXPECTED   ng/mg creat    Sources of hydrocodone include scheduled prescription medications.    Hydromorphone, dihydrocodeine and norhydrocodone are expected    metabolites of hydrocodone. Hydromorphone and dihydrocodeine are    also available as scheduled prescription  medications. Drug Present not Declared for Prescription Verification   Alpha-hydroxymidazolam         88           UNEXPECTED ng/mg creat    Alpha-hydroxymidazolam is an expected metabolite of midazolam.    Source of midazolam is a scheduled prescription medication. ==================================================================== Test                      Result    Flag   Units      Ref Range   Creatinine              268              mg/dL      >=20 ==================================================================== Declared Medications:  The flagging and interpretation on this report are based on the  following declared medications.  Unexpected results may arise from  inaccuracies in the declared medications.  **Note: The testing scope of this panel includes these medications:  Hydrocodone  **Note: The testing scope of this panel does not include the  following reported medications:  Acetaminophen  Albuterol  Aspirin  Beclomethasone  Diltiazem  Esomeprazole  Fluticasone  Furosemide  Hydrochlorothiazide  Ibuprofen  Losartan  Medroxyprogesterone  Metoprolol  Olodaterol  Pregabalin  Tiotropium ==================================================================== For clinical consultation, please call [phone]. ====================================================================    Laboratory Chemistry Profile (12 mo)  Renal: 08/02/2018: BUN/Creatinine Ratio NOT APPLICABLE 123456: BUN 14; Creatinine, Ser 0.65  Lab Results  Component Value Date   GFRAA >60 06/27/2019   GFRNONAA >60 06/27/2019   Hepatic: 06/27/2019: Albumin 3.9 Lab Results  Component Value Date   AST 14 (L) 06/27/2019   ALT 12 06/27/2019   Other: 06/27/2019: CRP 2.7; Sed Rate 39; Vit D, 25-Hydroxy 23.11; Vitamin B-12 432 Note: Above Lab results reviewed.  Imaging  Last 90 days:  Mr Tibia Fibula Left Wo Contrast  Result Date: 05/31/2019 CLINICAL DATA:  Progressive left calf pain and  swelling over the last 10 days. No acute injury. Recent gastric bypass surgery. Lower extremity Doppler ultrasound negative for DVT. EXAM: MRI OF LOWER LEFT EXTREMITY WITHOUT CONTRAST TECHNIQUE: Multiplanar, multisequence MR imaging of the left lower leg was performed. No intravenous contrast was administered. COMPARISON:  Limited correlation made with left knee MRI 10/06/2018. FINDINGS: Bones/Joint/Cartilage No evidence of  acute fracture, dislocation, bone destruction or cortical thickening. Left knee degenerative changes, primarily in the lateral compartment, are grossly stable. No significant joint effusions. Ligaments Not relevant for exam/indication. Muscles and Tendons There is edema within the medial head of the gastrocnemius muscle distally. In addition, there is mild overlying subcutaneous edema. No tendon rupture or retraction identified. The Achilles tendon appears normal. The visualized components of the additional ankle tendons appear normal. Soft tissues As above, asymmetric subcutaneous edema on the left surrounding the medial head of the gastrocnemius muscle distally. Minimal pretibial subcutaneous edema appears similar to the opposite side. No focal fluid collection. IMPRESSION: 1. Findings are most consistent with a strain/partial tear of the medial head of the left gastrocnemius muscle distally. No tendon rupture or retraction identified. 2. No focal fluid collection. 3. No significant osseous findings. Electronically Signed   By: Richardean Sale M.D.   On: 05/31/2019 08:00   US Venous Img Lower Unilateral Left  Result Date: 05/31/2019 CLINICAL DATA:  Left lower extremity pain and swelling for 2 weeks EXAM: LEFT LOWER EXTREMITY VENOUS DOPPLER ULTRASOUND TECHNIQUE: Gray-scale sonography with graded compression, as well as color Doppler and duplex ultrasound were performed to evaluate the lower extremity deep venous systems from the level of the common femoral vein and including the common  femoral, femoral, profunda femoral, popliteal and calf veins including the posterior tibial, peroneal and gastrocnemius veins when visible. The superficial great saphenous vein was also interrogated. Spectral Doppler was utilized to evaluate flow at rest and with distal augmentation maneuvers in the common femoral, femoral and popliteal veins. COMPARISON:  None. FINDINGS: Contralateral Common Femoral Vein: Respiratory phasicity is normal and symmetric with the symptomatic side. No evidence of thrombus. Normal compressibility. Common Femoral Vein: No evidence of thrombus. Normal compressibility, respiratory phasicity and response to augmentation. Saphenofemoral Junction: No evidence of thrombus. Normal compressibility and flow on color Doppler imaging. Profunda Femoral Vein: No evidence of thrombus. Normal compressibility and flow on color Doppler imaging. Femoral Vein: No evidence of thrombus. Normal compressibility, respiratory phasicity and response to augmentation. Popliteal Vein: No evidence of thrombus. Normal compressibility, respiratory phasicity and response to augmentation. Calf Veins: No evidence of thrombus. Normal compressibility and flow on color Doppler imaging. Superficial Great Saphenous Vein: No evidence of thrombus. Normal compressibility. Venous Reflux:  None. Other Findings:  None. IMPRESSION: No evidence of deep venous thrombosis. Electronically Signed   By: Inez Catalina M.D.   On: 05/31/2019 00:21   Dg Pain Clinic C-arm 1-60 Min No Report  Result Date: 07/11/2019 Fluoro was used, but no Radiologist interpretation will be provided. Please refer to "NOTES" tab for provider progress note.  Dg Pain Clinic C-arm 1-60 Min No Report  Result Date: 06/25/2019 Fluoro was used, but no Radiologist interpretation will be provided. Please refer to "NOTES" tab for provider progress note.   Assessment  The primary encounter diagnosis was Chronic pain syndrome. Diagnoses of Chronic knee pain  (Primary Area of Pain) (Bilateral) (L>R), Chronic wrist pain (Secondary Area of Pain) (Left), Chronic neck pain (Tertiary Area of Pain) (Bilateral) (L>R), Chronic shoulder pain (Fourth Area of Pain) (Bilateral) (L>R), Chronic low back pain (Fifth Area of Pain) (Bilateral) (R>L) w/o sciatica, Neurogenic pain, Fibromyalgia, and Osteoarthritis involving multiple joints were also pertinent to this visit.  Plan of Care  I have changed Bessye N. Lehnen's pregabalin. I am also having her start on HYDROcodone-acetaminophen, HYDROcodone-acetaminophen, pregabalin, pregabalin, and pregabalin. Additionally, I am having her maintain her aspirin EC, furosemide, metoprolol succinate, losartan-hydrochlorothiazide,  Stiolto Respimat, esomeprazole, albuterol, fluticasone, medroxyPROGESTERone Acetate, HYDROcodone-acetaminophen, diltiazem, HYDROcodone-acetaminophen, and ibuprofen.  Pharmacotherapy (Medications Ordered): Meds ordered this encounter  Medications  . HYDROcodone-acetaminophen (NORCO/VICODIN) 5-325 MG tablet    Sig: Take 1 tablet by mouth every 8 (eight) hours as needed for severe pain. Must last 30 days    Dispense:  90 tablet    Refill:  0    Chronic Pain: STOP Act (Not applicable) Fill 1 day early if closed on refill date. Do not fill until: 09/09/2019. To last until: 10/09/2019. Avoid benzodiazepines within 8 hours of opioids  . pregabalin (LYRICA) 25 MG capsule    Sig: Take 1 capsule (25 mg total) by mouth 3 (three) times daily for 21 days.    Dispense:  63 capsule    Refill:  0    Med titration: Prescriptions follow a specific schedule. Avoid dispensing out of order. Fill one day early if pharmacy is closed on scheduled refill date. May substitute for generic if available.  Marland Kitchen ibuprofen (ADVIL) 800 MG tablet    Sig: Take 1 tablet (800 mg total) by mouth every 8 (eight) hours as needed for moderate pain.    Dispense:  90 tablet    Refill:  2    Fill one day early if pharmacy is closed on scheduled  refill date. May substitute for generic if available.  Marland Kitchen HYDROcodone-acetaminophen (NORCO/VICODIN) 5-325 MG tablet    Sig: Take 1 tablet by mouth every 8 (eight) hours as needed for severe pain. Must last 30 days    Dispense:  90 tablet    Refill:  0    Chronic Pain: STOP Act (Not applicable) Fill 1 day early if closed on refill date. Do not fill until: 10/09/2019. To last until: 11/08/2019. Avoid benzodiazepines within 8 hours of opioids  . HYDROcodone-acetaminophen (NORCO/VICODIN) 5-325 MG tablet    Sig: Take 1 tablet by mouth every 8 (eight) hours as needed for severe pain. Must last 30 days    Dispense:  90 tablet    Refill:  0    Chronic Pain: STOP Act (Not applicable) Fill 1 day early if closed on refill date. Do not fill until: 11/08/2019. To last until: 12/08/2019. Avoid benzodiazepines within 8 hours of opioids  . pregabalin (LYRICA) 50 MG capsule    Sig: Take 1 capsule (50 mg total) by mouth 2 (two) times daily for 21 days, THEN 1 capsule (50 mg total) 3 (three) times daily for 21 days.    Dispense:  105 capsule    Refill:  0    Med titration: Prescriptions follow a specific schedule. Avoid dispensing out of order. Fill one day early if pharmacy is closed on scheduled refill date. May substitute for generic if available.  . pregabalin (LYRICA) 100 MG capsule    Sig: Take 1 capsule (100 mg total) by mouth 2 (two) times daily for 21 days, THEN 1 capsule (100 mg total) 3 (three) times daily for 21 days.    Dispense:  105 capsule    Refill:  0    Med titration: Prescriptions follow a specific schedule. Avoid dispensing out of order. Fill one day early if pharmacy is closed on scheduled refill date. May substitute for generic if available.  . pregabalin (LYRICA) 150 MG capsule    Sig: Take 1 capsule (150 mg total) by mouth 3 (three) times daily.    Dispense:  90 capsule    Refill:  0    Med titration: Prescriptions follow a specific  schedule. Avoid dispensing out of order. Fill one day  early if pharmacy is closed on scheduled refill date. May substitute for generic if available.   Orders:  No orders of the defined types were placed in this encounter.  Follow-up plan:   Return in about 4 months (around 12/04/2019) for (VV), (MM).      Considering: NOTE:NO Lumbar RFAuntil BMIis at or below 35. Diagnostic bilateral cervical facet NB Diagnostic bilateral cervical ESI Possible bilateral cervical facet RFA Diagnostic bilateral suprascapular NB Possible bilateral suprascapular nerve RFA Diagnostic bilateral LESI Possible bilateral lumbar facet RFA(NO Lumbar RFAuntil BMIis at or below 35.)   Palliative PRN treatment(s): Palliative bilateral IA Hyalgan knee injection S3N1(Fluoro required) Palliative bilateral IA knee joint (steroid) #2  Diagnostic/therapeutic bilateral knee genicular NB #2 Diagnostic/therapeutic left IA wrist injection (MNB/TPI) #2 Diagnostic bilateral IA shoulder injection #3 Diagnostic left suprascapular NB #2 Therapeutic left L4-5 LESI #4  Diagnostic right lumbar facet block #2    Recent Visits Date Type Provider Dept  07/11/19 Procedure visit Milinda Pointer, MD [facility]  06/25/19 Procedure visit Milinda Pointer, [facility]  06/03/19 Office Visit Milinda Pointer, MD [facility]  Showing recent visits within past 90 days and meeting all other requirements   Today's Visits Date Type Provider Dept  07/29/19 Telemedicine Milinda Pointer, MD [facility]  Showing today's visits and meeting all other requirements   Future Appointments No visits were found meeting these conditions.  Showing future appointments within next 90 days and meeting all other requirements   I discussed the assessment and treatment plan with the patient. The patient was provided an opportunity to ask questions and all were answered. The patient agreed with the plan and demonstrated an  understanding of the instructions.  Patient advised to call back or seek an in-person evaluation if the symptoms or condition worsens.  Total duration of non-face-to-face encounter: 15 minutes.  Note by: Gaspar Cola, MD Date: 07/29/2019; Time: 3:42 PM  Note: This dictation was prepared with Dragon dictation. Any transcriptional errors that may result from this process are unintentional.  Disclaimer:  * Given the special circumstances of the COVID-19 pandemic, the federal government has announced that the Office for Civil Rights (OCR) will exercise its enforcement discretion and will not impose penalties on physicians using telehealth in the event of noncompliance with regulatory requirements under the [place] and [place] (HIPAA) in connection with the good faith provision of telehealth during the XX123456 national public health emergency. ([place])

## 2019-07-29 ENCOUNTER — Ambulatory Visit: Payer: Medicaid Other | Attending: Pain Medicine | Admitting: Pain Medicine

## 2019-07-29 ENCOUNTER — Other Ambulatory Visit: Payer: Self-pay

## 2019-07-29 DIAGNOSIS — M25532 Pain in left wrist: Secondary | ICD-10-CM | POA: Diagnosis not present

## 2019-07-29 DIAGNOSIS — G8929 Other chronic pain: Secondary | ICD-10-CM

## 2019-07-29 DIAGNOSIS — M25562 Pain in left knee: Secondary | ICD-10-CM

## 2019-07-29 DIAGNOSIS — M25561 Pain in right knee: Secondary | ICD-10-CM | POA: Diagnosis not present

## 2019-07-29 DIAGNOSIS — G894 Chronic pain syndrome: Secondary | ICD-10-CM | POA: Diagnosis not present

## 2019-07-29 DIAGNOSIS — M159 Polyosteoarthritis, unspecified: Secondary | ICD-10-CM

## 2019-07-29 DIAGNOSIS — M797 Fibromyalgia: Secondary | ICD-10-CM

## 2019-07-29 DIAGNOSIS — M542 Cervicalgia: Secondary | ICD-10-CM | POA: Diagnosis not present

## 2019-07-29 DIAGNOSIS — M25511 Pain in right shoulder: Secondary | ICD-10-CM

## 2019-07-29 DIAGNOSIS — M792 Neuralgia and neuritis, unspecified: Secondary | ICD-10-CM

## 2019-07-29 DIAGNOSIS — M545 Low back pain: Secondary | ICD-10-CM

## 2019-07-29 DIAGNOSIS — M8949 Other hypertrophic osteoarthropathy, multiple sites: Secondary | ICD-10-CM

## 2019-07-29 DIAGNOSIS — M25512 Pain in left shoulder: Secondary | ICD-10-CM

## 2019-07-29 MED ORDER — IBUPROFEN 800 MG PO TABS: 800.0000 mg | ORAL_TABLET | Freq: Three times a day (TID) | ORAL | 2 refills | Status: DC | PRN

## 2019-07-29 MED ORDER — HYDROCODONE-ACETAMINOPHEN 5-325 MG PO TABS: 1.0000 | ORAL_TABLET | Freq: Three times a day (TID) | ORAL | 0 refills | Status: DC | PRN

## 2019-07-29 MED ORDER — PREGABALIN 50 MG PO CAPS: ORAL_CAPSULE | ORAL | 0 refills | Status: DC

## 2019-07-29 MED ORDER — PREGABALIN 25 MG PO CAPS: 25.0000 mg | ORAL_CAPSULE | Freq: Three times a day (TID) | ORAL | 0 refills | Status: DC

## 2019-07-29 MED ORDER — PREGABALIN 150 MG PO CAPS: 150.0000 mg | ORAL_CAPSULE | Freq: Three times a day (TID) | ORAL | 0 refills | Status: DC

## 2019-07-29 MED ORDER — PREGABALIN 100 MG PO CAPS: ORAL_CAPSULE | ORAL | 0 refills | Status: DC

## 2019-08-12 ENCOUNTER — Telehealth: Payer: Self-pay

## 2019-08-12 NOTE — Telephone Encounter (Signed)
Robin at [facility] called stating patient did not have a script to fill on 08-10-19

## 2019-08-12 NOTE — Telephone Encounter (Signed)
Spoke with pharmacy and they found it. Patient called and informed.

## 2019-08-12 NOTE — Telephone Encounter (Signed)
The pharmacy told her that the wrong date was put on her prescription for this months medicines, starting 08/10/19. Please call her when this is corrected.

## 2019-10-15 ENCOUNTER — Telehealth: Payer: Self-pay

## 2019-10-15 ENCOUNTER — Other Ambulatory Visit: Payer: Self-pay | Admitting: Pain Medicine

## 2019-10-15 ENCOUNTER — Telehealth: Payer: Self-pay | Admitting: *Deleted

## 2019-10-15 DIAGNOSIS — M545 Low back pain, unspecified: Secondary | ICD-10-CM

## 2019-10-15 DIAGNOSIS — G8929 Other chronic pain: Secondary | ICD-10-CM

## 2019-10-15 DIAGNOSIS — M47816 Spondylosis without myelopathy or radiculopathy, lumbar region: Secondary | ICD-10-CM

## 2019-10-15 NOTE — Telephone Encounter (Signed)
Called patient back and she would like to have bilateral Hyalgen knee injection and Lumbar facet. She states that Dr Dossie Arbour normally does thes at the same time. She will also need a Med management appt for the first week of March.   Pt is having 10/10 pain bilateral in  knees and back. She states she is having a hard time walking and climbing steps. "I cant do much of anything". Patient to call front desk and make appointments.

## 2019-10-15 NOTE — Telephone Encounter (Signed)
Called patient back and informed her that Dr Dossie Arbour will only do her back at this point and not the knees also. He states that she will probably need a replacement. Told patient and reviewed sedations. Patient is also going to schedule her med management appt.

## 2019-10-30 ENCOUNTER — Telehealth: Payer: Self-pay | Admitting: *Deleted

## 2019-10-31 ENCOUNTER — Ambulatory Visit: Payer: Medicaid Other | Admitting: Pain Medicine

## 2019-11-07 ENCOUNTER — Encounter: Payer: Self-pay | Admitting: Pain Medicine

## 2019-11-07 ENCOUNTER — Ambulatory Visit (HOSPITAL_BASED_OUTPATIENT_CLINIC_OR_DEPARTMENT_OTHER): Payer: Medicaid Other | Admitting: Pain Medicine

## 2019-11-07 ENCOUNTER — Ambulatory Visit
Admission: RE | Admit: 2019-11-07 | Discharge: 2019-11-07 | Disposition: A | Discharge status: Home / Self Care | Payer: Medicaid Other | Source: Ambulatory Visit | Attending: Pain Medicine | Admitting: Pain Medicine

## 2019-11-07 ENCOUNTER — Other Ambulatory Visit: Payer: Self-pay

## 2019-11-07 VITALS — BP 128/81 | Temp 97.4°F | Resp 18 | Ht 64.0 in | Wt 280.0 lb

## 2019-11-07 DIAGNOSIS — G894 Chronic pain syndrome: Secondary | ICD-10-CM

## 2019-11-07 DIAGNOSIS — M797 Fibromyalgia: Secondary | ICD-10-CM

## 2019-11-07 DIAGNOSIS — M47816 Spondylosis without myelopathy or radiculopathy, lumbar region: Secondary | ICD-10-CM | POA: Diagnosis not present

## 2019-11-07 DIAGNOSIS — G8929 Other chronic pain: Secondary | ICD-10-CM | POA: Insufficient documentation

## 2019-11-07 DIAGNOSIS — M25561 Pain in right knee: Secondary | ICD-10-CM | POA: Diagnosis present

## 2019-11-07 DIAGNOSIS — M545 Low back pain, unspecified: Secondary | ICD-10-CM

## 2019-11-07 DIAGNOSIS — M47817 Spondylosis without myelopathy or radiculopathy, lumbosacral region: Secondary | ICD-10-CM

## 2019-11-07 DIAGNOSIS — M5136 Other intervertebral disc degeneration, lumbar region: Secondary | ICD-10-CM

## 2019-11-07 DIAGNOSIS — Z6841 Body Mass Index (BMI) 40.0 and over, adult: Secondary | ICD-10-CM | POA: Insufficient documentation

## 2019-11-07 DIAGNOSIS — M170 Bilateral primary osteoarthritis of knee: Secondary | ICD-10-CM | POA: Diagnosis not present

## 2019-11-07 DIAGNOSIS — M25532 Pain in left wrist: Secondary | ICD-10-CM

## 2019-11-07 DIAGNOSIS — M792 Neuralgia and neuritis, unspecified: Secondary | ICD-10-CM | POA: Insufficient documentation

## 2019-11-07 DIAGNOSIS — M25562 Pain in left knee: Secondary | ICD-10-CM | POA: Insufficient documentation

## 2019-11-07 DIAGNOSIS — M51369 Other intervertebral disc degeneration, lumbar region without mention of lumbar back pain or lower extremity pain: Secondary | ICD-10-CM

## 2019-11-07 MED ORDER — SODIUM HYALURONATE (VISCOSUP) 20 MG/2ML IX SOSY
2.0000 mL | PREFILLED_SYRINGE | Freq: Once | INTRA_ARTICULAR | Status: AC
  Administered 2019-11-07: 2 mL via INTRA_ARTICULAR

## 2019-11-07 MED ORDER — HYDROCODONE-ACETAMINOPHEN 5-325 MG PO TABS: 1.0000 | ORAL_TABLET | Freq: Three times a day (TID) | ORAL | 0 refills | Status: DC | PRN

## 2019-11-07 MED ORDER — LIDOCAINE HCL (PF) 1 % IJ SOLN
5.0000 mL | Freq: Once | INTRAMUSCULAR | Status: AC
  Administered 2019-11-07: 5 mL
  Filled 2019-11-07: qty 5

## 2019-11-07 MED ORDER — ROPIVACAINE HCL 2 MG/ML IJ SOLN
5.0000 mL | Freq: Once | INTRAMUSCULAR | Status: AC
  Administered 2019-11-07: 5 mL via INTRA_ARTICULAR
  Filled 2019-11-07: qty 10

## 2019-11-07 MED ORDER — LIDOCAINE HCL 2 % IJ SOLN
20.0000 mL | Freq: Once | INTRAMUSCULAR | Status: AC
  Administered 2019-11-07: 400 mg
  Filled 2019-11-07: qty 40

## 2019-11-07 MED ORDER — MIDAZOLAM HCL 5 MG/5ML IJ SOLN
1.0000 mg | INTRAMUSCULAR | Status: DC | PRN
  Administered 2019-11-07: 2 mg via INTRAVENOUS
  Filled 2019-11-07: qty 5

## 2019-11-07 MED ORDER — PREGABALIN 150 MG PO CAPS: 150.0000 mg | ORAL_CAPSULE | Freq: Three times a day (TID) | ORAL | 5 refills | Status: DC

## 2019-11-07 MED ORDER — TRIAMCINOLONE ACETONIDE 40 MG/ML IJ SUSP
80.0000 mg | Freq: Once | INTRAMUSCULAR | Status: AC
  Administered 2019-11-07: 80 mg
  Filled 2019-11-07: qty 2

## 2019-11-07 MED ORDER — ROPIVACAINE HCL 2 MG/ML IJ SOLN
18.0000 mL | Freq: Once | INTRAMUSCULAR | Status: AC
  Administered 2019-11-07: 18 mL via PERINEURAL
  Filled 2019-11-07: qty 20

## 2019-11-07 MED ORDER — FENTANYL CITRATE (PF) 100 MCG/2ML IJ SOLN
25.0000 ug | INTRAMUSCULAR | Status: DC | PRN
  Administered 2019-11-07: 50 ug via INTRAVENOUS
  Filled 2019-11-07: qty 2

## 2019-11-07 MED ORDER — LACTATED RINGERS IV SOLN
1000.0000 mL | Freq: Once | INTRAVENOUS | Status: AC
  Administered 2019-11-07: 1000 mL via INTRAVENOUS

## 2019-11-07 NOTE — Progress Notes (Signed)
PROVIDER NOTE: Information contained herein reflects review and annotations entered in association with encounter. Interpretation of such information and data should be left to medically-trained personnel. Information provided to patient can be located elsewhere in the medical record under "Patient Instructions". Document created using STT-dictation technology, any transcriptional errors that may result from process are unintentional.    Patient: Lauren [name]  Service Category: Procedure  Provider: Gaspar Cola, MD  DOB: 03-Sep-1976  DOS: 11/07/2019  Location: [facility]  MRN: [id number]  Setting: Ambulatory - outpatient  Referring Provider: Milinda Pointer, MD  Type: Established Patient  Specialty: Interventional Pain Management  PCP: Lorelee Market, MD   Primary Reason for Visit: Interventional Pain Management Treatment. CC: Back Pain (lower) and Knee Pain (bilateral)  Procedure #1:  Anesthesia, Analgesia, Anxiolysis:  Type: Palliative Intra-Articular Hyalgan Knee Injection #S3N1 (last completed on 07/11/2019) (3.9 months) Region: Lateral infrapatellar Knee Region Level: Knee Joint Laterality: Bilateral  Type: Moderate (Conscious) Sedation combined with Local Anesthesia Indication(s): Analgesia and Anxiety Local Anesthetic: Lidocaine 1-2% Route: Intravenous (IV) IV Access: Secured Sedation: Meaningful verbal contact was maintained at all times during the procedure   Position: Supine w/ knee bent 20 to 30 degrees   Indications: 1. Chronic knee pain (Primary Area of Pain) (Bilateral) (L>R)   2. Osteoarthritis of knees (Bilateral)    Procedure #2:  Anesthesia, Analgesia, Anxiolysis:  Type: Lumbar Facet, Medial Branch Block(s)          Primary Purpose: Palliative Region: Posterolateral Lumbosacral Spine Level: L2, L3, L4, L5, & S1 Medial Branch Level(s). Injecting these levels blocks the L3-4, L4-5, and L5-S1 lumbar facet joints. Laterality: Bilateral   Type: Moderate (Conscious) Sedation combined with Local Anesthesia Indication(s): Analgesia and Anxiety Route: Intravenous (IV) IV Access: Secured Sedation: Meaningful verbal contact was maintained at all times during the procedure  Local Anesthetic: Lidocaine 1-2%  Position: Prone   Indications: 1. Lumbar facet syndrome (Bilateral) (R>L)   2. Spondylosis without myelopathy or radiculopathy, lumbosacral region   3. DDD (degenerative disc disease), lumbar   4. Chronic low back pain (Fifth Area of Pain) (Bilateral) (R>L) w/o sciatica   5. Morbid obesity with BMI of 50.0-59.9, adult (HCC)    Pain Score: Pre-procedure: 5 /10 Post-procedure: 0-No pain/10   Pre-op Assessment:  Lauren [name] is a 44 y.o. (year old), female patient, seen today for interventional treatment. She  has a past surgical history that includes Rotator cuff repair; Knee surgery; Cholecystectomy; Cesarean section (2010); Cesarean section (2011); Tonsillectomy; Cardiac electrophysiology study and ablation (01/18/2018); and Excision mass neck (Left, 10/11/2018). Lauren [name] has a current medication list which includes the following prescription(s): albuterol, aspirin ec, diltiazem, esomeprazole, furosemide, [START ON 11/08/2019] hydrocodone-acetaminophen, ibuprofen, losartan-hydrochlorothiazide, medroxyprogesterone acetate, metoprolol succinate, stiolto respimat, fluticasone, [START ON 12/08/2019] hydrocodone-acetaminophen, [START ON 01/07/2020] hydrocodone-acetaminophen, [START ON 02/06/2020] hydrocodone-acetaminophen, and [START ON 12/09/2019] pregabalin, and the following Facility-Administered Medications: fentanyl and midazolam. Her primarily concern today is the Back Pain (lower) and Knee Pain (bilateral)  Initial Vital Signs:  Pulse/HCG Rate:  ECG Heart Rate: 91 Temp: 97.7 F (36.5 C) Resp: (!) 88 BP: 105/81 SpO2: 100 %  BMI: Estimated body mass index is 48.06 kg/m as calculated from the following:   Height as of this  encounter: 5\' 4"  (1.626 m).   Weight as of this encounter: 280 lb (127 kg).  Risk Assessment: Allergies: Reviewed. She is allergic to nucynta [tapentadol] and tape.  Allergy Precautions: None required Coagulopathies: Reviewed. None identified.  Blood-thinner therapy: None at  this time Active Infection(s): Reviewed. None identified. Lauren [name] is afebrile  Site Confirmation: Lauren [name] was asked to confirm the procedure and laterality before marking the site Procedure checklist: Completed Consent: Before the procedure and under the influence of no sedative(s), amnesic(s), or anxiolytics, the patient was informed of the treatment options, risks and possible complications. To fulfill our ethical and legal obligations, as recommended by the American Medical Association's Code of Ethics, I have informed the patient of my clinical impression; the nature and purpose of the treatment or procedure; the risks, benefits, and possible complications of the intervention; the alternatives, including doing nothing; the risk(s) and benefit(s) of the alternative treatment(s) or procedure(s); and the risk(s) and benefit(s) of doing nothing. The patient was provided information about the general risks and possible complications associated with the procedure. These may include, but are not limited to: failure to achieve desired goals, infection, bleeding, organ or nerve damage, allergic reactions, paralysis, and death. In addition, the patient was informed of those risks and complications associated to the procedure, such as failure to decrease pain; infection; bleeding; organ or nerve damage with subsequent damage to sensory, motor, and/or autonomic systems, resulting in permanent pain, numbness, and/or weakness of one or several areas of the body; allergic reactions; (i.e.: anaphylactic reaction); and/or death. Furthermore, the patient was informed of those risks and complications associated with the medications.  These include, but are not limited to: allergic reactions (i.e.: anaphylactic or anaphylactoid reaction(s)); adrenal axis suppression; blood sugar elevation that in diabetics may result in ketoacidosis or comma; water retention that in patients with history of congestive heart failure may result in shortness of breath, pulmonary edema, and decompensation with resultant heart failure; weight gain; swelling or edema; medication-induced neural toxicity; particulate matter embolism and blood vessel occlusion with resultant organ, and/or nervous system infarction; and/or aseptic necrosis of one or more joints. Finally, the patient was informed that Medicine is not an exact science; therefore, there is also the possibility of unforeseen or unpredictable risks and/or possible complications that may result in a catastrophic outcome. The patient indicated having understood very clearly. We have given the patient no guarantees and we have made no promises. Enough time was given to the patient to ask questions, all of which were answered to the patient's satisfaction. Ms. Kundrat has indicated that she wanted to continue with the procedure. Attestation: I, the ordering provider, attest that I have discussed with the patient the benefits, risks, side-effects, alternatives, likelihood of achieving goals, and potential problems during recovery for the procedure that I have provided informed consent. Date  Time: 11/07/2019  7:52 AM  Pre-Procedure Preparation:  Monitoring: As per clinic protocol. Respiration, ETCO2, SpO2, BP, heart rate and rhythm monitor placed and checked for adequate function Safety Precautions: Patient was assessed for positional comfort and pressure points before starting the procedure. Time-out: I initiated and conducted the "Time-out" before starting the procedure, as per protocol. The patient was asked to participate by confirming the accuracy of the "Time Out" information. Verification of the  correct person, site, and procedure were performed and confirmed by me, the nursing staff, and the patient. "Time-out" conducted as per Joint Commission's Universal Protocol (UP.01.01.01). Time: 0913  Description of Procedure #1:  Target Area: Knee Joint Approach: Just above the Lateral tibial plateau, lateral to the infrapatellar tendon. Area Prepped: Entire knee area, from the mid-thigh to the mid-shin. Prepping solution: DuraPrep (Iodine Povacrylex [0.7% available iodine] and Isopropyl Alcohol, 74% w/w) Safety Precautions: Aspiration looking for blood  return was conducted prior to all injections. At no point did we inject any substances, as a needle was being advanced. No attempts were made at seeking any paresthesias. Safe injection practices and needle disposal techniques used. Medications properly checked for expiration dates. SDV (single dose vial) medications used. Description of the Procedure: Protocol guidelines were followed. The patient was placed in position over the fluoroscopy table. The target area was identified and the area prepped in the usual manner. Skin & deeper tissues infiltrated with local anesthetic. Appropriate amount of time allowed to pass for local anesthetics to take effect. The procedure needles were then advanced to the target area. Proper needle placement secured. Negative aspiration confirmed. Solution injected in intermittent fashion, asking for systemic symptoms every 0.5cc of injectate. The needles were then removed and the area cleansed, making sure to leave some of the prepping solution back to take advantage of its long term bactericidal properties. Vitals:   11/07/19 0923 11/07/19 0933 11/07/19 0943 11/07/19 0953  BP: 105/76 119/62 128/76 128/81  Resp: 12 16 18 18   Temp:  97.6 F (36.4 C)  (!) 97.4 F (36.3 C)  SpO2: 100% 100% 100% 100%  Weight:      Height:        Start Time: 0913 hrs. End Time: 0923 hrs. Materials:  Needle(s) Type: Regular  needle Gauge: 22G Length: 3.5-in Medication(s): Please see orders for medications and dosing details.  Imaging Guidance for procedure #1:  Type of Imaging Technique: Fluoroscopy Guidance (Non-spinal) Indication(s): Morbid obesity. Assistance in needle guidance and placement for procedures requiring needle placement in or near specific anatomical locations impossible to access without such assistance. Exposure Time: Please see nurses notes. Contrast: None used. Fluoroscopic Guidance: I was personally present during the use of fluoroscopy. "Tunnel Vision Technique" used to obtain the best possible view of the target area. Parallax error corrected before commencing the procedure. "Direction-depth-direction" technique used to introduce the needle under continuous pulsed fluoroscopy. Once target was reached, antero-posterior, oblique, and lateral fluoroscopic projection used confirm needle placement in all planes. Images permanently stored in EMR. Ultrasound Guidance: N/A Interpretation: No contrast injected. I personally interpreted the imaging intraoperatively. Adequate needle placement confirmed in multiple planes. Permanent images saved into the patient's record.  Description of Procedure #2:  Laterality: Bilateral. The procedure was performed in identical fashion on both sides. Levels:  L2, L3, L4, L5, & S1 Medial Branch Level(s) Area Prepped: Posterior Lumbosacral Region Prepping solution: DuraPrep (Iodine Povacrylex [0.7% available iodine] and Isopropyl Alcohol, 74% w/w) Safety Precautions: Aspiration looking for blood return was conducted prior to all injections. At no point did we inject any substances, as a needle was being advanced. Before injecting, the patient was told to immediately notify me if she was experiencing any new onset of "ringing in the ears, or metallic taste in the mouth". No attempts were made at seeking any paresthesias. Safe injection practices and needle disposal  techniques used. Medications properly checked for expiration dates. SDV (single dose vial) medications used. After the completion of the procedure, all disposable equipment used was discarded in the proper designated medical waste containers. Local Anesthesia: Protocol guidelines were followed. The patient was positioned over the fluoroscopy table. The area was prepped in the usual manner. The time-out was completed. The target area was identified using fluoroscopy. A 12-in long, straight, sterile hemostat was used with fluoroscopic guidance to locate the targets for each level blocked. Once located, the skin was marked with an approved surgical skin marker. Once  all sites were marked, the skin (epidermis, dermis, and hypodermis), as well as deeper tissues (fat, connective tissue and muscle) were infiltrated with a small amount of a short-acting local anesthetic, loaded on a 10cc syringe with a 25G, 1.5-in  Needle. An appropriate amount of time was allowed for local anesthetics to take effect before proceeding to the next step. Local Anesthetic: Lidocaine 2.0% The unused portion of the local anesthetic was discarded in the proper designated containers. Technical explanation of process:  L2 Medial Branch Nerve Block (MBB): The target area for the L2 medial branch is at the junction of the postero-lateral aspect of the superior articular process and the superior, posterior, and medial edge of the transverse process of L3. Under fluoroscopic guidance, a Quincke needle was inserted until contact was made with os over the superior postero-lateral aspect of the pedicular shadow (target area). After negative aspiration for blood, 0.5 mL of the nerve block solution was injected without difficulty or complication. The needle was removed intact. L3 Medial Branch Nerve Block (MBB): The target area for the L3 medial branch is at the junction of the postero-lateral aspect of the superior articular process and the superior,  posterior, and medial edge of the transverse process of L4. Under fluoroscopic guidance, a Quincke needle was inserted until contact was made with os over the superior postero-lateral aspect of the pedicular shadow (target area). After negative aspiration for blood, 0.5 mL of the nerve block solution was injected without difficulty or complication. The needle was removed intact. L4 Medial Branch Nerve Block (MBB): The target area for the L4 medial branch is at the junction of the postero-lateral aspect of the superior articular process and the superior, posterior, and medial edge of the transverse process of L5. Under fluoroscopic guidance, a Quincke needle was inserted until contact was made with os over the superior postero-lateral aspect of the pedicular shadow (target area). After negative aspiration for blood, 0.5 mL of the nerve block solution was injected without difficulty or complication. The needle was removed intact. L5 Medial Branch Nerve Block (MBB): The target area for the L5 medial branch is at the junction of the postero-lateral aspect of the superior articular process and the superior, posterior, and medial edge of the sacral ala. Under fluoroscopic guidance, a Quincke needle was inserted until contact was made with os over the superior postero-lateral aspect of the pedicular shadow (target area). After negative aspiration for blood, 0.5 mL of the nerve block solution was injected without difficulty or complication. The needle was removed intact. S1 Medial Branch Nerve Block (MBB): The target area for the S1 medial branch is at the posterior and inferior 6 o'clock position of the L5-S1 facet joint. Under fluoroscopic guidance, the Quincke needle inserted for the L5 MBB was redirected until contact was made with os over the inferior and postero aspect of the sacrum, at the 6 o' clock position under the L5-S1 facet joint (Target area). After negative aspiration for blood, 0.5 mL of the nerve block  solution was injected without difficulty or complication. The needle was removed intact.  Nerve block solution: 0.2% PF-Ropivacaine + Triamcinolone (40 mg/mL) diluted to a final concentration of 4 mg of Triamcinolone/mL of Ropivacaine The unused portion of the solution was discarded in the proper designated containers. Procedural Needles: 22-gauge, 7-inch, Quincke needles used for all levels.  Once the entire procedure was completed, the treated area was cleaned, making sure to leave some of the prepping solution back to take advantage of its  long term bactericidal properties.   Illustration of the posterior view of the lumbar spine and the posterior neural structures. Laminae of L2 through S1 are labeled. DPRL5, dorsal primary ramus of L5; DPRS1, dorsal primary ramus of S1; DPR3, dorsal primary ramus of L3; FJ, facet (zygapophyseal) joint L3-L4; I, inferior articular process of L4; LB1, lateral branch of dorsal primary ramus of L1; IAB, inferior articular branches from L3 medial branch (supplies L4-L5 facet joint); IBP, intermediate branch plexus; MB3, medial branch of dorsal primary ramus of L3; NR3, third lumbar nerve root; S, superior articular process of L5; SAB, superior articular branches from L4 (supplies L4-5 facet joint also); TP3, transverse process of L3.  Vitals:   11/07/19 0923 11/07/19 0933 11/07/19 0943 11/07/19 0953  BP: 105/76 119/62 128/76 128/81  Resp: 12 16 18 18   Temp:  97.6 F (36.4 C)  (!) 97.4 F (36.3 C)  SpO2: 100% 100% 100% 100%  Weight:      Height:         Start Time: 0913 hrs. End Time: 0923 hrs.  Imaging Guidance (Spinal) for procedure #2:  Type of Imaging Technique: Fluoroscopy Guidance (Spinal) Indication(s): Assistance in needle guidance and placement for procedures requiring needle placement in or near specific anatomical locations not easily accessible without such assistance. Exposure Time: Please see nurses notes. Contrast: None used. Fluoroscopic  Guidance: I was personally present during the use of fluoroscopy. "Tunnel Vision Technique" used to obtain the best possible view of the target area. Parallax error corrected before commencing the procedure. "Direction-depth-direction" technique used to introduce the needle under continuous pulsed fluoroscopy. Once target was reached, antero-posterior, oblique, and lateral fluoroscopic projection used confirm needle placement in all planes. Images permanently stored in EMR. Interpretation: No contrast injected. I personally interpreted the imaging intraoperatively. Adequate needle placement confirmed in multiple planes. Permanent images saved into the patient's record.  Antibiotic Prophylaxis:   Anti-infectives (From admission, onward)   None     Indication(s): None identified  Post-operative Assessment:  Post-procedure Vital Signs:  Pulse/HCG Rate:  79 Temp: (!) 97.4 F (36.3 C) Resp: 18 BP: 128/81 SpO2: 100 %  EBL: None  Complications: No immediate post-treatment complications observed by team, or reported by patient.  Note: The patient tolerated the entire procedure well. A repeat set of vitals were taken after the procedure and the patient was kept under observation following institutional policy, for this type of procedure. Post-procedural neurological assessment was performed, showing return to baseline, prior to discharge. The patient was provided with post-procedure discharge instructions, including a section on how to identify potential problems. Should any problems arise concerning this procedure, the patient was given instructions to immediately contact us, at any time, without hesitation. In any case, we plan to contact the patient by telephone for a follow-up status report regarding this interventional procedure.  Comments:  No additional relevant information.  Plan of Care  Orders:  Orders Placed This Encounter  Procedures  . KNEE INJECTION    Hyalgan knee injection to  be done by MD.    Scheduling Instructions:     Procedure: Intra-articular Hyalgan Knee injection #1     Side(s): Bilateral Knee     Sedation: With Sedation.     Timeframe: Today    Order Specific Question:   Where will this procedure be performed?    Answer:   [facility]  . KNEE INJECTION    Hyalgan knee injection. Please order Hyalgan.    Standing Status:  Future    Standing Expiration Date:   12/05/2019    Scheduling Instructions:     Procedure: Intra-articular Hyalgan Knee injection #2 (w/ fluoro)     Side: Bilateral     Sedation: With Sedation.     Timeframe: in two (2) weeks    Order Specific Question:   Where will this procedure be performed?    Answer:   [facility]  . LUMBAR FACET(MEDIAL BRANCH NERVE BLOCK) MBNB    Scheduling Instructions:     Procedure: Lumbar facet block (AKA.: Lumbosacral medial branch nerve block)     Side: Bilateral     Level: L3-4, L4-5, & L5-S1 Facets (L2, L3, L4, L5, & S1 Medial Branch Nerves)     Sedation: Patient's choice.     Timeframe: Today    Order Specific Question:   Where will this procedure be performed?    Answer:   [facility]  . DG PAIN CLINIC C-ARM 1-60 MIN NO REPORT    Intraoperative interpretation by procedural physician at [place].    Standing Status:   Standing    Number of Occurrences:   1    Order Specific Question:   Reason for exam:    Answer:   Assistance in needle guidance and placement for procedures requiring needle placement in or near specific anatomical locations not easily accessible without such assistance.  . Informed Consent Details: Physician/Practitioner Attestation; Transcribe to consent form and obtain patient signature    Provider Attestation: I, [place] Dossie Arbour, MD, (Pain Management Specialist), the physician/practitioner, attest that I have discussed with the patient the benefits, risks, side effects, alternatives, likelihood of achieving goals and potential  problems during recovery for the procedure that I have provided informed consent.    Scheduling Instructions:     Procedure: Therapeutic, bilateral, intra-articular Hyalgan knee injection     Indications: Chronic bilateral knee pain secondary to primary osteoarthritis of the knee     Note: Always confirm laterality of pain with Ms. [place]ney, before procedure.     Transcribe to consent form and obtain patient signature.  . Provide equipment / supplies at bedside    Equipment required: Single use, disposable, "Block Tray"    Standing Status:   Standing    Number of Occurrences:   1    Order Specific Question:   Specify    Answer:   Block Tray  . Informed Consent Details: Physician/Practitioner Attestation; Transcribe to consent form and obtain patient signature    Nursing Order: Transcribe to consent form and obtain patient signature. Note: Always confirm laterality of pain with Ms. Staggs, before procedure. Procedure: Lumbar Facet Block  under fluoroscopic guidance Indication/Reason: Low Back Pain, with our without leg pain, due to Facet Joint Arthralgia (Joint Pain) known as Lumbar Facet Syndrome, secondary to Lumbar, and/or Lumbosacral Spondylosis (Arthritis of the Spine), without myelopathy or radiculopathy (Nerve Damage). Provider Attestation: I, [place] Dossie Arbour, MD, (Pain Management Specialist), the physician/practitioner, attest that I have discussed with the patient the benefits, risks, side effects, alternatives, likelihood of achieving goals and potential problems during recovery for the procedure that I have provided informed consent.   Chronic Opioid Analgesic:  Hydrocodone/APAP 5/325, 1 tab PO q 8 hrs (15 mg/day of hydrocodone) MME/day: 15 mg/day.   Medications ordered for procedure: Meds ordered this encounter  Medications  . lidocaine (XYLOCAINE) 2 % (with pres) injection 400 mg  . lactated ringers infusion 1,000 mL  . midazolam (VERSED) 5 MG/5ML injection 1-2  mg     Make sure Flumazenil is available in the pyxis when using this medication. If oversedation occurs, administer 0.2 mg IV over 15 sec. If after 45 sec no response, administer 0.2 mg again over 1 min; may repeat at 1 min intervals; not to exceed 4 doses (1 mg)  . fentaNYL (SUBLIMAZE) injection 25-50 mcg    Make sure Narcan is available in the pyxis when using this medication. In the event of respiratory depression (RR< 8/min): Titrate NARCAN (naloxone) in increments of 0.1 to 0.2 mg IV at 2-3 minute intervals, until desired degree of reversal.  . lidocaine (PF) (XYLOCAINE) 1 % injection 5 mL  . ropivacaine (PF) 2 mg/mL (0.2%) (NAROPIN) injection 5 mL  . Sodium Hyaluronate SOSY 2 mL  . Sodium Hyaluronate SOSY 2 mL  . ropivacaine (PF) 2 mg/mL (0.2%) (NAROPIN) injection 18 mL  . triamcinolone acetonide (KENALOG-40) injection 80 mg  . HYDROcodone-acetaminophen (NORCO/VICODIN) 5-325 MG tablet    Sig: Take 1 tablet by mouth every 8 (eight) hours as needed for severe pain. Must last 30 days    Dispense:  90 tablet    Refill:  0    Chronic Pain: STOP Act (Not applicable) Fill 1 day early if closed on refill date. Do not fill until: 12/08/2019. To last until: 01/07/2020. Avoid benzodiazepines within 8 hours of opioids  . HYDROcodone-acetaminophen (NORCO/VICODIN) 5-325 MG tablet    Sig: Take 1 tablet by mouth every 8 (eight) hours as needed for severe pain. Must last 30 days    Dispense:  90 tablet    Refill:  0    Chronic Pain: STOP Act (Not applicable) Fill 1 day early if closed on refill date. Do not fill until: 01/07/2020. To last until: 02/06/2020. Avoid benzodiazepines within 8 hours of opioids  . HYDROcodone-acetaminophen (NORCO/VICODIN) 5-325 MG tablet    Sig: Take 1 tablet by mouth every 8 (eight) hours as needed for severe pain. Must last 30 days    Dispense:  90 tablet    Refill:  0    Chronic Pain: STOP Act (Not applicable) Fill 1 day early if closed on refill date. Do not fill until: 02/06/2020.  To last until: 03/07/2020. Avoid benzodiazepines within 8 hours of opioids  . pregabalin (LYRICA) 150 MG capsule    Sig: Take 1 capsule (150 mg total) by mouth 3 (three) times daily.    Dispense:  90 capsule    Refill:  5    Med titration: Prescriptions follow a specific schedule. Avoid dispensing out of order. Fill one day early if pharmacy is closed on scheduled refill date. May substitute for generic if available.   Medications administered: We administered lidocaine, lactated ringers, midazolam, fentaNYL, lidocaine (PF), ropivacaine (PF) 2 mg/mL (0.2%), Sodium Hyaluronate, Sodium Hyaluronate, ropivacaine (PF) 2 mg/mL (0.2%), and triamcinolone acetonide.  See the medical record for exact dosing, route, and time of administration.  Follow-up plan:   Return in about 2 weeks (around 11/21/2019) for (PP), in addition, Procedure (no sedation): (B) Hyalgan #2.       Considering: NOTE:NO Lumbar RFAuntil BMIis at or below 35. Diagnostic bilateral cervical facet NB Diagnostic bilateral cervical ESI Possible bilateral cervical facet RFA Diagnostic bilateral suprascapular NB Possible bilateral suprascapular nerve RFA Diagnostic bilateral LESI Possible bilateral lumbar facet RFA(NO Lumbar RFAuntil BMIis at or below 35.)   Palliative PRN treatment(s): Palliative bilateral IA Hyalgan knee injection S3N2(Fluoro required)(1st - 45mo)(2nd - 3.9 mo) Palliative bilateral IA knee joint (steroid) #2  Diagnostic/therapeutic  bilateral knee genicular NB #2 Diagnostic/therapeutic left IA wrist injection (MNB/TPI) #2 Diagnostic bilateral IA shoulder injection #3 Diagnostic left suprascapular NB #2 Therapeutic left L4-5 LESI #4  Diagnostic right lumbar facet block #2    Recent Visits No visits were found meeting these conditions.  Showing recent visits within past 90 days and meeting all other requirements   Today's Visits Date Type Provider Dept  11/07/19 Procedure visit Milinda Pointer, MD [facility]  Showing today's visits and meeting all other requirements   Future Appointments Date Type Provider Dept  11/21/19 Appointment Milinda Pointer, MD [facility]  Showing future appointments within next 90 days and meeting all other requirements   Disposition: Discharge home  Discharge (Date  Time): 11/07/2019; 0954 hrs.   Primary Care Physician: Lorelee Market, MD Location: [facility] Note by: Gaspar Cola, MD Date: 11/07/2019; Time: 10:36 AM  Disclaimer:  Medicine is not an exact science. The only guarantee in medicine is that nothing is guaranteed. It is important to note that the decision to proceed with this intervention was based on the information collected from the patient. The Data and conclusions were drawn from the patient's questionnaire, the interview, and the physical examination. Because the information was provided in large part by the patient, it cannot be guaranteed that it has not been purposely or unconsciously manipulated. Every effort has been made to obtain as much relevant data as possible for this evaluation. It is important to note that the conclusions that lead to this procedure are derived in large part from the available data. Always take into account that the treatment will also be dependent on availability of resources and existing treatment guidelines, considered by other Pain Management Practitioners as being common knowledge and practice, at the time of the intervention. For Medico-Legal purposes, it is also important to point out that variation in procedural techniques and pharmacological choices are the acceptable norm. The indications, contraindications, technique, and results of the above procedure should only be interpreted and judged by a Board-Certified Interventional Pain Specialist with extensive familiarity and expertise in the same exact procedure and technique.

## 2019-11-07 NOTE — Patient Instructions (Signed)
____________________________________________________________________________________________  Post-Procedure Discharge Instructions  Instructions:  Apply ice:   Purpose: This will minimize any swelling and discomfort after procedure.   When: Day of procedure, as soon as you get home.  How: Fill a plastic sandwich bag with crushed ice. Cover it with a small towel and apply to injection site.  How long: (15 min on, 15 min off) Apply for 15 minutes then remove x 15 minutes.  Repeat sequence on day of procedure, until you go to bed.  Apply heat:   Purpose: To treat any soreness and discomfort from the procedure.  When: Starting the next day after the procedure.  How: Apply heat to procedure site starting the day following the procedure.  How long: May continue to repeat daily, until discomfort goes away.  Food intake: Start with clear liquids (like water) and advance to regular food, as tolerated.   Physical activities: Keep activities to a minimum for the first 8 hours after the procedure. After that, then as tolerated.  Driving: If you have received any sedation, be responsible and do not drive. You are not allowed to drive for 24 hours after having sedation.  Blood thinner: (Applies only to those taking blood thinners) You may restart your blood thinner 6 hours after your procedure.  Insulin: (Applies only to Diabetic patients taking insulin) As soon as you can eat, you may resume your normal dosing schedule.  Infection prevention: Keep procedure site clean and dry. Shower daily and clean area with soap and water.  Post-procedure Pain Diary: Extremely important that this be done correctly and accurately. Recorded information will be used to determine the next step in treatment. For the purpose of accuracy, follow these rules:  Evaluate only the area treated. Do not report or include pain from an untreated area. For the purpose of this evaluation, ignore all other areas of pain,  except for the treated area.  After your procedure, avoid taking a long nap and attempting to complete the pain diary after you wake up. Instead, set your alarm clock to go off every hour, on the hour, for the initial 8 hours after the procedure. Document the duration of the numbing medicine, and the relief you are getting from it.  Do not go to sleep and attempt to complete it later. It will not be accurate. If you received sedation, it is likely that you were given a medication that may cause amnesia. Because of this, completing the diary at a later time may cause the information to be inaccurate. This information is needed to plan your care.  Follow-up appointment: Keep your post-procedure follow-up evaluation appointment after the procedure (usually 2 weeks for most procedures, 6 weeks for radiofrequencies). DO NOT FORGET to bring you pain diary with you.   Expect: (What should I expect to see with my procedure?)  From numbing medicine (AKA: Local Anesthetics): Numbness or decrease in pain. You may also experience some weakness, which if present, could last for the duration of the local anesthetic.  Onset: Full effect within 15 minutes of injected.  Duration: It will depend on the type of local anesthetic used. On the average, 1 to 8 hours.   From steroids (Applies only if steroids were used): Decrease in swelling or inflammation. Once inflammation is improved, relief of the pain will follow.  Onset of benefits: Depends on the amount of swelling present. The more swelling, the longer it will take for the benefits to be seen. In some cases, up to 10 days.    Duration: Steroids will stay in the system x 2 weeks. Duration of benefits will depend on multiple posibilities including persistent irritating factors.  Side-effects: If present, they may typically last 2 weeks (the duration of the steroids).  Frequent: Cramps (if they occur, drink Gatorade and take over-the-counter Magnesium 450-500 mg  once to twice a day); water retention with temporary weight gain; increases in blood sugar; decreased immune system response; increased appetite.  Occasional: Facial flushing (red, warm cheeks); mood swings; menstrual changes.  Uncommon: Long-term decrease or suppression of natural hormones; bone thinning. (These are more common with higher doses or more frequent use. This is why we prefer that our patients avoid having any injection therapies in other practices.)   Very Rare: Severe mood changes; psychosis; aseptic necrosis.  From procedure: Some discomfort is to be expected once the numbing medicine wears off. This should be minimal if ice and heat are applied as instructed.  Call if: (When should I call?)  You experience numbness and weakness that gets worse with time, as opposed to wearing off.  New onset bowel or bladder incontinence. (Applies only to procedures done in the spine)  Emergency Numbers:  Durning business hours (Monday - Thursday, 8:00 AM - 4:00 PM) (Friday, 9:00 AM - 12:00 Noon): [phone]  After hours: [phone]  NOTE: If you are having a problem and are unable connect with, or to talk to a provider, then go to your nearest urgent care or emergency department. If the problem is serious and urgent, please call 911. ____________________________________________________________________________________________    ______________________________________________________________________________________________  Weight Management Required  URGENT: Your weight has been found to be adversely affecting your health.  Dear Lauren [name]:  Your current Estimated body mass index is 51.15 kg/m as calculated from the following:   Height as of 07/11/19: 5\' 4"  (1.626 m).   Weight as of 07/11/19: 298 lb (135.2 kg).  Calculations estimate your ideal body weight to be: Patient weight not recorded  Please use the table below to identify your weight category and  associated incidence of chronic pain, secondary to your weight.  Body Mass Index (BMI) Classification BMI level (kg/m2) Category Associated incidence of chronic pain  <18  Underweight   18.5-24.9 Ideal body weight   25-29.9 Overweight  20%  30-34.9 Obese (Class I)  68%  35-39.9 Severe obesity (Class II)  136%  >40 Extreme obesity (Class III)  254%   In addition: You will be considered "Morbidly Obese", if your BMI is above 30 and you have one or more of the following conditions which are known to be directly associated with obesity: 1.    Type 2 Diabetes (Which in turn can lead to cardiovascular diseases (CVD), stroke, peripheral vascular diseases (PVD), retinopathy, nephropathy, and neuropathy) 2.    Cardiovascular Disease (High Blood Pressure; Congestive Heart Failure; High Cholesterol; Coronary Artery Disease; Angina; or History of Heart Attacks) 3.    Breathing problems (Asthma; obesity-hypoventilation syndrome; obstructive sleep apnea; chronic inflammatory airway disease; reactive airway disease; or shortness of breath) 4.    Chronic kidney disease 5.    Liver disease (nonalcoholic fatty liver disease) 6.    High blood pressure 7.    Acid reflux (gastroesophageal reflux disease; heartburn) 8.    Osteoarthritis (OA) (with any of the following: hip pain; knee pain; and/or low back pain) 9.    Low back pain (Lumbar Facet Syndrome; and/or Degenerative Disc Disease) 10.  Hip pain (Osteoarthritis of hip) (For every 1 lbs of added  body weight, there is a 2 lbs increase in pressure inside of each hip articulation. 1:2 mechanical relationship) 11.  Knee pain (Osteoarthritis of knee) (For every 1 lbs of added body weight, there is a 4 lbs increase in pressure inside of each knee articulation. 1:4 mechanical relationship) (patients with a BMI>30 kg/m2 were 6.8 times more likely to develop knee OA than normal-weight individuals) 12.  Cancer. Epidemiological studies have shown that obesity is a risk  factor for: post-menopausal breast cancer; cancers of the endometrium, colon and kidney cancer; malignant adenomas of the oesophagus. Obese subjects have an approximately 1.5-3.5-fold increased risk of developing these cancers compared with normal-weight subjects, and it has been estimated that between 15 and 45% of these cancers can be attributed to overweight. More recent studies suggest that obesity may also increase the risk of other types of cancer, including pancreatic, hepatic and gallbladder cancer. (Ref: Obesity and cancer. Pischon T, Nthlings U, [employer] H. Proc Nutr Soc. 2008 May;67(2):128-45. doi: [id number].)  Recommendation: At this point it is urgent that you take a step back and concentrate in loosing weight. Dedicate 100% of your efforts on this task. Nothing else will improve your health more than bringing down your BMI to less than 30. Because most chronic pain patients do have difficulty exercising secondary to their pain, you must rely on proper nutrition and dieting in order to lose the weight. If your BMI is above 40, you should seriously consider bariatric surgery. A realistic goal is to lose 10% of your body weight over a period of 12 months.  If over time you have unsuccessfully try to lose weight, then it is time for you to seek professional help and to enter a medically supervised weight management program.  Pain management considerations:  1.    Pharmacological Problems: Be advised that the use of opioid analgesics (oxycodone; hydrocodone; morphine; methadone; codeine; and all of their derivatives) have been associated with decreased metabolism and weight gain.  For this reason, should we see that you are unable to lose weight while taking these medications, it may become necessary for [place] to taper down and indefinitely discontinue them.  2.    Technical Problems: The incidence of successful interventional therapies decreases as the patient's BMI increases. It is much  more difficult to accomplish a safe and effective interventional therapy on a patient with a BMI above 35. 3.    Radiation Exposure Problems: The x-rays machine, used to accomplish injection therapies, will automatically increase their x-ray output in order to capture an appropriate bone image. This means that radiation exposure increases exponentially with the patient's BMI. (The higher the BMI, the higher the radiation exposure.) Although the level of radiation used at a given time is still safe to the patient, it is not for the physician and/or assisting staff. Unfortunately, radiation exposure is accumulative. Because physicians and the staff have to do procedures and be exposed on a daily basis, this can result in health problems such as cancer and radiation burns. Radiation exposure to the staff is monitored by the radiation batches that they wear. The exposure levels are reported back to the staff on a quarterly basis. Depending on levels of exposure, physicians and staff may be obligated by law to decrease this exposure. This means that they have the right and obligation to refuse providing therapies where they may be overexposed to radiation. For this reason, physicians may decline to offer therapies such as radiofrequency ablation or implants to patients with a BMI  above 40. 4.    Current Trends: Be advised that the current trend is to no longer offer certain therapies to patients with a BMI equal to, or above 35, due to increase perioperative risks, increased technical procedural difficulties, and excessive radiation exposure to healthcare personnel.  ______________________________________________________________________________________________

## 2019-11-08 ENCOUNTER — Telehealth: Payer: Self-pay

## 2019-11-08 NOTE — Telephone Encounter (Signed)
Post procedure phone call. Patient states she is doing good.  

## 2019-11-21 ENCOUNTER — Ambulatory Visit (HOSPITAL_BASED_OUTPATIENT_CLINIC_OR_DEPARTMENT_OTHER): Payer: Medicaid Other | Admitting: Pain Medicine

## 2019-11-21 ENCOUNTER — Other Ambulatory Visit: Payer: Self-pay

## 2019-11-21 ENCOUNTER — Ambulatory Visit
Admission: RE | Admit: 2019-11-21 | Discharge: 2019-11-21 | Disposition: A | Discharge status: Home / Self Care | Payer: Medicaid Other | Source: Ambulatory Visit | Attending: Pain Medicine | Admitting: Pain Medicine

## 2019-11-21 ENCOUNTER — Encounter: Payer: Self-pay | Admitting: Pain Medicine

## 2019-11-21 VITALS — BP 141/99 | HR 88 | Temp 97.8°F | Resp 18 | Ht 64.0 in | Wt 278.0 lb

## 2019-11-21 DIAGNOSIS — M25562 Pain in left knee: Secondary | ICD-10-CM | POA: Diagnosis present

## 2019-11-21 DIAGNOSIS — M25561 Pain in right knee: Secondary | ICD-10-CM | POA: Diagnosis present

## 2019-11-21 DIAGNOSIS — M170 Bilateral primary osteoarthritis of knee: Secondary | ICD-10-CM | POA: Insufficient documentation

## 2019-11-21 DIAGNOSIS — G8929 Other chronic pain: Secondary | ICD-10-CM | POA: Insufficient documentation

## 2019-11-21 MED ORDER — ROPIVACAINE HCL 2 MG/ML IJ SOLN
5.0000 mL | Freq: Once | INTRAMUSCULAR | Status: AC
  Administered 2019-11-21: 5 mL via INTRA_ARTICULAR

## 2019-11-21 MED ORDER — SODIUM HYALURONATE (VISCOSUP) 20 MG/2ML IX SOSY
2.0000 mL | PREFILLED_SYRINGE | Freq: Once | INTRA_ARTICULAR | Status: AC
  Administered 2019-11-21: 2 mL via INTRA_ARTICULAR

## 2019-11-21 MED ORDER — LIDOCAINE HCL (PF) 1 % IJ SOLN
INTRAMUSCULAR | Status: AC
  Filled 2019-11-21: qty 5

## 2019-11-21 MED ORDER — ROPIVACAINE HCL 2 MG/ML IJ SOLN
INTRAMUSCULAR | Status: AC
  Filled 2019-11-21: qty 10

## 2019-11-21 MED ORDER — LIDOCAINE HCL 2 % IJ SOLN
20.0000 mL | Freq: Once | INTRAMUSCULAR | Status: AC
  Administered 2019-11-21: 400 mg
  Filled 2019-11-21: qty 20

## 2019-11-21 MED ORDER — LIDOCAINE HCL (PF) 1 % IJ SOLN
5.0000 mL | Freq: Once | INTRAMUSCULAR | Status: AC
  Administered 2019-11-21: 5 mL

## 2019-11-21 NOTE — Patient Instructions (Signed)
____________________________________________________________________________________________  Post-Procedure Discharge Instructions  Instructions:  Apply ice:   Purpose: This will minimize any swelling and discomfort after procedure.   When: Day of procedure, as soon as you get home.  How: Fill a plastic sandwich bag with crushed ice. Cover it with a small towel and apply to injection site.  How long: (15 min on, 15 min off) Apply for 15 minutes then remove x 15 minutes.  Repeat sequence on day of procedure, until you go to bed.  Apply heat:   Purpose: To treat any soreness and discomfort from the procedure.  When: Starting the next day after the procedure.  How: Apply heat to procedure site starting the day following the procedure.  How long: May continue to repeat daily, until discomfort goes away.  Food intake: Start with clear liquids (like water) and advance to regular food, as tolerated.   Physical activities: Keep activities to a minimum for the first 8 hours after the procedure. After that, then as tolerated.  Driving: If you have received any sedation, be responsible and do not drive. You are not allowed to drive for 24 hours after having sedation.  Blood thinner: (Applies only to those taking blood thinners) You may restart your blood thinner 6 hours after your procedure.  Insulin: (Applies only to Diabetic patients taking insulin) As soon as you can eat, you may resume your normal dosing schedule.  Infection prevention: Keep procedure site clean and dry. Shower daily and clean area with soap and water.  Post-procedure Pain Diary: Extremely important that this be done correctly and accurately. Recorded information will be used to determine the next step in treatment. For the purpose of accuracy, follow these rules:  Evaluate only the area treated. Do not report or include pain from an untreated area. For the purpose of this evaluation, ignore all other areas of pain,  except for the treated area.  After your procedure, avoid taking a long nap and attempting to complete the pain diary after you wake up. Instead, set your alarm clock to go off every hour, on the hour, for the initial 8 hours after the procedure. Document the duration of the numbing medicine, and the relief you are getting from it.  Do not go to sleep and attempt to complete it later. It will not be accurate. If you received sedation, it is likely that you were given a medication that may cause amnesia. Because of this, completing the diary at a later time may cause the information to be inaccurate. This information is needed to plan your care.  Follow-up appointment: Keep your post-procedure follow-up evaluation appointment after the procedure (usually 2 weeks for most procedures, 6 weeks for radiofrequencies). DO NOT FORGET to bring you pain diary with you.   Expect: (What should I expect to see with my procedure?)  From numbing medicine (AKA: Local Anesthetics): Numbness or decrease in pain. You may also experience some weakness, which if present, could last for the duration of the local anesthetic.  Onset: Full effect within 15 minutes of injected.  Duration: It will depend on the type of local anesthetic used. On the average, 1 to 8 hours.   From steroids (Applies only if steroids were used): Decrease in swelling or inflammation. Once inflammation is improved, relief of the pain will follow.  Onset of benefits: Depends on the amount of swelling present. The more swelling, the longer it will take for the benefits to be seen. In some cases, up to 10 days.  Duration: Steroids will stay in the system x 2 weeks. Duration of benefits will depend on multiple posibilities including persistent irritating factors.  Side-effects: If present, they may typically last 2 weeks (the duration of the steroids).  Frequent: Cramps (if they occur, drink Gatorade and take over-the-counter Magnesium 450-500 mg  once to twice a day); water retention with temporary weight gain; increases in blood sugar; decreased immune system response; increased appetite.  Occasional: Facial flushing (red, warm cheeks); mood swings; menstrual changes.  Uncommon: Long-term decrease or suppression of natural hormones; bone thinning. (These are more common with higher doses or more frequent use. This is why we prefer that our patients avoid having any injection therapies in other practices.)   Very Rare: Severe mood changes; psychosis; aseptic necrosis.  From procedure: Some discomfort is to be expected once the numbing medicine wears off. This should be minimal if ice and heat are applied as instructed.  Call if: (When should I call?)  You experience numbness and weakness that gets worse with time, as opposed to wearing off.  New onset bowel or bladder incontinence. (Applies only to procedures done in the spine)  Emergency Numbers:  Durning business hours (Monday - Thursday, 8:00 AM - 4:00 PM) (Friday, 9:00 AM - 12:00 Noon): [phone]  After hours: [phone]  NOTE: If you are having a problem and are unable connect with, or to talk to a provider, then go to your nearest urgent care or emergency department. If the problem is serious and urgent, please call 911. ____________________________________________________________________________________________   ____________________________________________________________________________________________  Preparing for your procedure (without sedation)  Procedure appointments are limited to planned procedures: . No Prescription Refills. . No disability issues will be discussed. . No medication changes will be discussed.  Instructions: . Oral Intake: Do not eat or drink anything for at least 3 hours prior to your procedure. . Transportation: Unless otherwise stated by your physician, you may drive yourself after the procedure. . Blood Pressure Medicine:  Take your blood pressure medicine with a sip of water the morning of the procedure. . Blood thinners: Notify our staff if you are taking any blood thinners. Depending on which one you take, there will be specific instructions on how and when to stop it. . Diabetics on insulin: Notify the staff so that you can be scheduled 1st case in the morning. If your diabetes requires high dose insulin, take only  of your normal insulin dose the morning of the procedure and notify the staff that you have done so. . Preventing infections: Shower with an antibacterial soap the morning of your procedure.  . Build-up your immune system: Take 1000 mg of Vitamin C with every meal (3 times a day) the day prior to your procedure. . Antibiotics: Inform the staff if you have a condition or reason that requires you to take antibiotics before dental procedures. . Pregnancy: If you are pregnant, call and cancel the procedure. . Sickness: If you have a cold, fever, or any active infections, call and cancel the procedure. . Arrival: You must be in the facility at least 30 minutes prior to your scheduled procedure. . Children: Do not bring any children with you. . Dress appropriately: Bring dark clothing that you would not mind if they get stained. . Valuables: Do not bring any jewelry or valuables.  Reasons to call and reschedule or cancel your procedure: (Following these recommendations will minimize the risk of a serious complication.) . Surgeries: Avoid having procedures within 2 weeks   of any surgery. (Avoid for 2 weeks before or after any surgery). . Flu Shots: Avoid having procedures within 2 weeks of a flu shots or . (Avoid for 2 weeks before or after immunizations). . Barium: Avoid having a procedure within 7-10 days after having had a radiological study involving the use of radiological contrast. (Myelograms, Barium swallow or enema study). . Heart attacks: Avoid any elective procedures or surgeries for the initial 6  months after a "Myocardial Infarction" (Heart Attack). . Blood thinners: It is imperative that you stop these medications before procedures. Let us know if you if you take any blood thinner.  . Infection: Avoid procedures during or within two weeks of an infection (including chest colds or gastrointestinal problems). Symptoms associated with infections include: Localized redness, fever, chills, night sweats or profuse sweating, burning sensation when voiding, cough, congestion, stuffiness, runny nose, sore throat, diarrhea, nausea, vomiting, cold or Flu symptoms, recent or current infections. It is specially important if the infection is over the area that we intend to treat. Marland Kitchen Heart and lung problems: Symptoms that may suggest an active cardiopulmonary problem include: cough, chest pain, breathing difficulties or shortness of breath, dizziness, ankle swelling, uncontrolled high or unusually low blood pressure, and/or palpitations. If you are experiencing any of these symptoms, cancel your procedure and contact your primary care physician for an evaluation.  Remember:  Regular Business hours are:  Monday to Thursday 8:00 AM to 4:00 PM  Provider's Schedule: Milinda Pointer, MD:  Procedure days: Tuesday and Thursday 7:30 AM to 4:00 PM  Gillis Santa, MD:  Procedure days: Monday and Wednesday 7:30 AM to 4:00 PM ____________________________________________________________________________________________    ______________________________________________________________________________________________  Weight Management Required  URGENT: Your weight has been found to be adversely affecting your health.  Dear Lauren [name]:  Your current Estimated body mass index is 48.06 kg/m as calculated from the following:   Height as of 11/07/19: '5\' 4"'$  (1.626 m).   Weight as of 11/07/19: 280 lb (127 kg).  Please use the table below to identify your weight category and associated incidence of chronic  pain, secondary to your weight.  Body Mass Index (BMI) Classification BMI level (kg/m2) Category Associated incidence of chronic pain  <18  Underweight   18.5-24.9 Ideal body weight   25-29.9 Overweight  20%  30-34.9 Obese (Class I)  68%  35-39.9 Severe obesity (Class II)  136%  >40 Extreme obesity (Class III)  254%   In addition: You will be considered "Morbidly Obese", if your BMI is above 30 and you have one or more of the following conditions which are known to be caused and/or directly associated with obesity: 1.    Type 2 Diabetes (Which in turn can lead to cardiovascular diseases (CVD), stroke, peripheral vascular diseases (PVD), retinopathy, nephropathy, and neuropathy) 2.    Cardiovascular Disease (High Blood Pressure; Congestive Heart Failure; High Cholesterol; Coronary Artery Disease; Angina; or History of Heart Attacks) 3.    Breathing problems (Asthma; obesity-hypoventilation syndrome; obstructive sleep apnea; chronic inflammatory airway disease; reactive airway disease; or shortness of breath) 4.    Chronic kidney disease 5.    Liver disease (nonalcoholic fatty liver disease) 6.    High blood pressure 7.    Acid reflux (gastroesophageal reflux disease; heartburn) 8.    Osteoarthritis (OA) (with any of the following: hip pain; knee pain; and/or low back pain) 9.    Low back pain (Lumbar Facet Syndrome; and/or Degenerative Disc Disease) 10.  Hip pain (Osteoarthritis of hip) (  For every 1 lbs of added body weight, there is a 2 lbs increase in pressure inside of each hip articulation. 1:2 mechanical relationship) 11.  Knee pain (Osteoarthritis of knee) (For every 1 lbs of added body weight, there is a 4 lbs increase in pressure inside of each knee articulation. 1:4 mechanical relationship) (patients with a BMI>30 kg/m2 were 6.8 times more likely to develop knee OA than normal-weight individuals) 12.  Cancer: Epidemiological studies have shown that obesity is a risk factor for:  post-menopausal breast cancer; cancers of the endometrium, colon and kidney cancer; malignant adenomas of the oesophagus. Obese subjects have an approximately 1.5-3.5-fold increased risk of developing these cancers compared with normal-weight subjects, and it has been estimated that between 15 and 45% of these cancers can be attributed to overweight. More recent studies suggest that obesity may also increase the risk of other types of cancer, including pancreatic, hepatic and gallbladder cancer. (Ref: Obesity and cancer. Pischon T, Nthlings U, [employer] H. Proc Nutr Soc. 2008 May;67(2):128-45. doi: 58.5277/O2423536144315400.) The International Agency for Research on Cancer (IARC) has identified 13 cancers associated with overweight and obesity: meningioma, multiple myeloma, adenocarcinoma of the esophagus, and cancers of the thyroid, postmenopausal breast cancer, gallbladder, stomach, liver, pancreas, kidney, ovaries, uterus, colon and rectal (colorectal) cancers. 28 percent of all cancers diagnosed in women and 24 percent of those diagnosed in men are associated with overweight and obesity.  Recommendation: At this point it is urgent that you take a step back and concentrate in loosing weight. Dedicate 100% of your efforts on this task. Nothing else will improve your health more than bringing your weight down and your BMI to less than 30. If you are here, you probably have chronic pain. Because most chronic pain patients have difficulty exercising secondary to their pain, you must rely on proper nutrition and diet in order to lose the weight. If your BMI is above 40, you should seriously consider bariatric surgery. A realistic goal is to lose 10% of your body weight over a period of 12 months.  Be honest to yourself, if over time you have unsuccessfully tried to lose weight, then it is time for you to seek professional help and to enter a medically supervised weight management program, and/or undergo bariatric  surgery. Stop procrastinating.   Pain management considerations:  1.    Pharmacological Problems: Be advised that the use of opioid analgesics (oxycodone; hydrocodone; morphine; methadone; codeine; and all of their derivatives) have been associated with decreased metabolism and weight gain.  For this reason, should we see that you are unable to lose weight while taking these medications, it may become necessary for [place] to taper down and indefinitely discontinue them.  2.    Technical Problems: The incidence of successful interventional therapies decreases as the patient's BMI increases. It is much more difficult to accomplish a safe and effective interventional therapy on a patient with a BMI above 35. 3.    Radiation Exposure Problems: The x-rays machine, used to accomplish injection therapies, will automatically increase their x-ray output in order to capture an appropriate bone image. This means that radiation exposure increases exponentially with the patient's BMI. (The higher the BMI, the higher the radiation exposure.) Although the level of radiation used at a given time is still safe to the patient, it is not for the physician and/or assisting staff. Unfortunately, radiation exposure is accumulative. Because physicians and the staff have to do procedures and be exposed on a daily basis, this can  result in health problems such as cancer and radiation burns. Radiation exposure to the staff is monitored by the radiation batches that they wear. The exposure levels are reported back to the staff on a quarterly basis. Depending on levels of exposure, physicians and staff may be obligated by law to decrease this exposure. This means that they have the right and obligation to refuse providing therapies where they may be overexposed to radiation. For this reason, physicians may decline to offer therapies such as radiofrequency ablation or implants to patients with a BMI above 40. 4.    Current Trends: Be advised  that the current trend is to no longer offer certain therapies to patients with a BMI equal to, or above 35, due to increase perioperative risks, increased technical procedural difficulties, and excessive radiation exposure to healthcare personnel.  ______________________________________________________________________________________________    ____________________________________________________________________________________________  Pain Management Weight Control Diet   Note: Before starting this diet, make sure to talk to your primary care physician to be sure it is safe for you.   BMI:  Your current Estimated body mass index is 48.06 kg/m as calculated from the following:   Height as of 11/07/19: '5\' 4"'$  (1.626 m).   Weight as of 11/07/19: 280 lb (127 kg).  Breakfast:   Drink 12 oz of cold water 15-30 minutes prior to meal  1 boiled or pouched egg. You may use the egg white ready-made preparations.  1 wheat low calorie toast.   8 oz. black coffee without milk, creamer, or any type of sweetener. (If a sweetener must be used, then use stevia).   Lunch & Dinner:   Drink 12-24 oz. of ice water, 30 minutes prior to meal  5 oz. Lean Protein like chicken, fish, or lean meat (above 95% fat free).   No cold cuts or processed meats. (No red meat)  Steamed, baked or grilled. (Never fried).  No oils, fats, lard, or butter.   1 cup of steamed vegetables, or 1.5 cups if raw.   1 serving of salad or greens (No dressings, except vinegar or lemon juice).   Mid-day & Mid-afternoon snack:   1 fruit. No bananas. (maximum of 2 fruits per day)   Important Rules:  1. Drink water. Must drink 100 oz. or more of water per day.   Consult your Primary Care Physician if you have a history of kidney failure, or congestive heart failure before doing this.  2. Take calcium and magnesium every day to avoid night cramps.   Consult your Primary Care Physician if you have a history of kidney  failure, hypercalcemia, or parathyroid problems before doing this.   Over-the-counter calcium 1200 mg/day (in the morning). Take with Vitamin D 5000 IU every day.   Over-the-counter magnesium 400 to 500 mg per day (1-2 hours prior to bedtime).  3. Avoid salt. Never add any salt and avoid salty foods. Do not take more than 2,300 mg/day. 4. Avoid eating anything after 6:00 pm.  5. Weight yourself every morning at the same time and record weight on a notebook.  6. Mix "Benefiber" 3 to 5 table spoons in water and drink before each meals.  7. No sodas.  8. No alcohol.  9. No sugar.  10. No artificial sweeteners.   Stevia without sugar is the only sweetener aloud.  11. No bread except for the one breakfast toast.   Low calorie wheat bread.  12. Duration of diet: 2 weeks at a time with 2 days' rest, then  repeat, until BMI is less than 30.  13. Restrict food volume. When feeding restrict the total volume of food that you eat at a meal. Never eat more than what you can hold in the palm of one hand. 14. Eat very slow. Chew food twenty (20) times before swallowing. 15. Restrict amount of times that you eat. Do not feed any sooner than every 4 hours. 16. Do not over-eat or over-indulge yourself in the 2 days of rest from the diet.  17. Follow ancient [place] customs. Have your meals sitting on the floor.  You will notice that your stomach will be creating an increased abdominal pressure that will not allow you to overeat. (This last one may be difficult or impossible for most chronic pain patients).     ____________________________________________________________________________________________

## 2019-11-21 NOTE — Progress Notes (Signed)
PROVIDER NOTE: Information contained herein reflects review and annotations entered in association with encounter. Interpretation of such information and data should be left to medically-trained personnel. Information provided to patient can be located elsewhere in the medical record under "Patient Instructions". Document created using STT-dictation technology, any transcriptional errors that may result from process are unintentional.    Patient: Lauren [name]  Service Category: Procedure  Provider: Gaspar Cola, MD  DOB: 1976/01/30  DOS: 11/21/2019  Location: [facility]  MRN: [id number]  Setting: Ambulatory - outpatient  Referring Provider: Lorelee Market, MD  Type: Established Patient  Specialty: Interventional Pain Management  PCP: Lorelee Market, MD   Primary Reason for Visit: Interventional Pain Management Treatment. CC: Knee Pain (bilateral)  Procedure:          Anesthesia, Analgesia, Anxiolysis:  Type: Palliative Intra-Articular Hyalgan Knee Injection #2  Region: Lateral infrapatellar Knee Region Level: Knee Joint Laterality: Bilateral  Type: Local Anesthesia Indication(s): Analgesia         Local Anesthetic: Lidocaine 1-2% Route: Infiltration (Martinez/IM) IV Access: Declined Sedation: Declined   Position: Supine w/ knee bent 20 to 30 degrees   Indications: 1. Osteoarthritis of knees (Bilateral)   2. Chronic knee pain (Primary Area of Pain) (Bilateral) (L>R)    Pain Score: Pre-procedure: 5 /10 Post-procedure: 0-No pain/10   Post-Procedure Evaluation  Procedure: Palliative bilateral lumbar facet block + palliative bilateral Hyalgan knee injection #1 under fluoroscopic guidance and IV sedation Pre-procedure pain level: 5/10 Post-procedure: 0/10 (100% relief)  Sedation: Sedation provided.  Effectiveness during initial hour after procedure(Ultra-Short Term Relief): 100 %.  Local anesthetic used: Long-acting (4-6 hours) Effectiveness: Defined as  any analgesic benefit obtained secondary to the administration of local anesthetics. This carries significant diagnostic value as to the etiological location, or anatomical origin, of the pain. Duration of benefit is expected to coincide with the duration of the local anesthetic used.  Effectiveness during initial 4-6 hours after procedure(Short-Term Relief): 100 %.  Long-term benefit: Defined as any relief past the pharmacologic duration of the local anesthetics.  Effectiveness past the initial 6 hours after procedure(Long-Term Relief): 50 %.  Current benefits: Defined as benefit that persist at this time.   Analgesia:  50% improved Function: Lauren [name] reports improvement in function ROM: Lauren [name] reports improvement in ROM  Pre-op Assessment:  Lauren [name] is a 44 y.o. (year old), female patient, seen today for interventional treatment. She  has a past surgical history that includes Rotator cuff repair; Knee surgery; Cholecystectomy; Cesarean section (2010); Cesarean section (2011); Tonsillectomy; Cardiac electrophysiology study and ablation (01/18/2018); and Excision mass neck (Left, 10/11/2018). Lauren [name] has a current medication list which includes the following prescription(s): albuterol, aspirin ec, beclomethasone, diltiazem, diltiazem, esomeprazole, ferrous sulfate, furosemide, hydrocodone-acetaminophen, [START ON 12/08/2019] hydrocodone-acetaminophen, [START ON 01/07/2020] hydrocodone-acetaminophen, [START ON 02/06/2020] hydrocodone-acetaminophen, ibuprofen, losartan-hydrochlorothiazide, medroxyprogesterone acetate, metoprolol succinate, fish oil, [START ON 12/09/2019] pregabalin, stiolto respimat, and fluticasone. Her primarily concern today is the Knee Pain (bilateral)  Initial Vital Signs:  Pulse/HCG Rate: 88  Temp: 97.8 F (36.6 C) Resp: 18 BP: (!) 141/99 SpO2: 98 %  BMI: Estimated body mass index is 47.72 kg/m as calculated from the following:   Height as of this  encounter: 5\' 4"  (1.626 m).   Weight as of this encounter: 278 lb (126.1 kg).  Risk Assessment: Allergies: Reviewed. She is allergic to nucynta [tapentadol] and tape.  Allergy Precautions: None required Coagulopathies: Reviewed. None identified.  Blood-thinner therapy: None at this time Active Infection(s):  Reviewed. None identified. Lauren [name] is afebrile  Site Confirmation: Lauren [name] was asked to confirm the procedure and laterality before marking the site Procedure checklist: Completed Consent: Before the procedure and under the influence of no sedative(s), amnesic(s), or anxiolytics, the patient was informed of the treatment options, risks and possible complications. To fulfill our ethical and legal obligations, as recommended by the American Medical Association's Code of Ethics, I have informed the patient of my clinical impression; the nature and purpose of the treatment or procedure; the risks, benefits, and possible complications of the intervention; the alternatives, including doing nothing; the risk(s) and benefit(s) of the alternative treatment(s) or procedure(s); and the risk(s) and benefit(s) of doing nothing. The patient was provided information about the general risks and possible complications associated with the procedure. These may include, but are not limited to: failure to achieve desired goals, infection, bleeding, organ or nerve damage, allergic reactions, paralysis, and death. In addition, the patient was informed of those risks and complications associated to the procedure, such as failure to decrease pain; infection; bleeding; organ or nerve damage with subsequent damage to sensory, motor, and/or autonomic systems, resulting in permanent pain, numbness, and/or weakness of one or several areas of the body; allergic reactions; (i.e.: anaphylactic reaction); and/or death. Furthermore, the patient was informed of those risks and complications associated with the  medications. These include, but are not limited to: allergic reactions (i.e.: anaphylactic or anaphylactoid reaction(s)); adrenal axis suppression; blood sugar elevation that in diabetics may result in ketoacidosis or comma; water retention that in patients with history of congestive heart failure may result in shortness of breath, pulmonary edema, and decompensation with resultant heart failure; weight gain; swelling or edema; medication-induced neural toxicity; particulate matter embolism and blood vessel occlusion with resultant organ, and/or nervous system infarction; and/or aseptic necrosis of one or more joints. Finally, the patient was informed that Medicine is not an exact science; therefore, there is also the possibility of unforeseen or unpredictable risks and/or possible complications that may result in a catastrophic outcome. The patient indicated having understood very clearly. We have given the patient no guarantees and we have made no promises. Enough time was given to the patient to ask questions, all of which were answered to the patient's satisfaction. Lauren [name] has indicated that she wanted to continue with the procedure. Attestation: I, the ordering provider, attest that I have discussed with the patient the benefits, risks, side-effects, alternatives, likelihood of achieving goals, and potential problems during recovery for the procedure that I have provided informed consent. Date  Time: 11/21/2019  7:59 AM  Pre-Procedure Preparation:  Monitoring: As per clinic protocol. Respiration, ETCO2, SpO2, BP, heart rate and rhythm monitor placed and checked for adequate function Safety Precautions: Patient was assessed for positional comfort and pressure points before starting the procedure. Time-out: I initiated and conducted the "Time-out" before starting the procedure, as per protocol. The patient was asked to participate by confirming the accuracy of the "Time Out" information.  Verification of the correct person, site, and procedure were performed and confirmed by me, the nursing staff, and the patient. "Time-out" conducted as per Joint Commission's Universal Protocol (UP.01.01.01). Time: 0836  Description of Procedure:          Target Area: Knee Joint Approach: Just above the Lateral tibial plateau, lateral to the infrapatellar tendon. Area Prepped: Entire knee area, from the mid-thigh to the mid-shin. Prepping solution: DuraPrep (Iodine Povacrylex [0.7% available iodine] and Isopropyl Alcohol, 74% w/w) Safety Precautions: Aspiration  looking for blood return was conducted prior to all injections. At no point did we inject any substances, as a needle was being advanced. No attempts were made at seeking any paresthesias. Safe injection practices and needle disposal techniques used. Medications properly checked for expiration dates. SDV (single dose vial) medications used. Description of the Procedure: Protocol guidelines were followed. The patient was placed in position over the fluoroscopy table. The target area was identified and the area prepped in the usual manner. Skin & deeper tissues infiltrated with local anesthetic. Appropriate amount of time allowed to pass for local anesthetics to take effect. The procedure needles were then advanced to the target area. Proper needle placement secured. Negative aspiration confirmed. Solution injected in intermittent fashion, asking for systemic symptoms every 0.5cc of injectate. The needles were then removed and the area cleansed, making sure to leave some of the prepping solution back to take advantage of its long term bactericidal properties. Vitals:   11/21/19 0758  BP: (!) 141/99  Pulse: 88  Resp: 18  Temp: 97.8 F (36.6 C)  SpO2: 98%  Weight: 278 lb (126.1 kg)  Height: 5\' 4"  (1.626 m)    Start Time: 0837 hrs. End Time: 0847 hrs. Materials:  Needle(s) Type: Regular needle Gauge: 25G Length: 1.5-in Medication(s):  Please see orders for medications and dosing details.  Imaging Guidance:          Type of Imaging Technique: Fluoroscopy Guidance (Non-spinal) Indication(s): Morbid obesity. Assistance in needle guidance and placement for procedures requiring needle placement in or near specific anatomical locations impossible to access without such assistance. Exposure Time: No patient exposure Contrast: None used. Fluoroscopic Guidance: I was personally present during the use of fluoroscopy. "Tunnel Vision Technique" used to obtain the best possible view of the target area. Parallax error corrected before commencing the procedure. "Direction-depth-direction" technique used to introduce the needle under continuous pulsed fluoroscopy. Once target was reached, antero-posterior, oblique, and lateral fluoroscopic projection used confirm needle placement in all planes. Images permanently stored in EMR. Ultrasound Guidance: N/A Interpretation: No contrast injected. I personally interpreted the imaging intraoperatively. Adequate needle placement confirmed in multiple planes. Permanent images saved into the patient's record.  Antibiotic Prophylaxis:   Anti-infectives (From admission, onward)   None     Indication(s): None identified  Post-operative Assessment:  Post-procedure Vital Signs:  Pulse/HCG Rate: 88  Temp: 97.8 F (36.6 C) Resp: 18 BP: (!) 141/99 SpO2: 98 %  EBL: None  Complications: No immediate post-treatment complications observed by team, or reported by patient.  Note: The patient tolerated the entire procedure well. A repeat set of vitals were taken after the procedure and the patient was kept under observation following institutional policy, for this type of procedure. Post-procedural neurological assessment was performed, showing return to baseline, prior to discharge. The patient was provided with post-procedure discharge instructions, including a section on how to identify potential  problems. Should any problems arise concerning this procedure, the patient was given instructions to immediately contact us, at any time, without hesitation. In any case, we plan to contact the patient by telephone for a follow-up status report regarding this interventional procedure.  Comments:  No additional relevant information.  Plan of Care  Orders:  Orders Placed This Encounter  Procedures  . KNEE INJECTION    Hyalgan knee injection to be done by MD.    Scheduling Instructions:     Procedure: Intra-articular Hyalgan Knee injection #2     Side(s): Bilateral Knee  Sedation: None     Timeframe: Today    Order Specific Question:   Where will this procedure be performed?    Answer:   [facility]  . KNEE INJECTION    Hyalgan knee injection. Please order Hyalgan.    Standing Status:   Future    Standing Expiration Date:   12/22/2019    Scheduling Instructions:     Procedure: Intra-articular Hyalgan Knee injection #3     Side: Bilateral     Sedation: None     Timeframe: in two (2) weeks    Order Specific Question:   Where will this procedure be performed?    Answer:   [facility]  . DG PAIN [facility] C-ARM 1-60 MIN NO REPORT    Intraoperative interpretation by procedural physician at [place].    Standing Status:   Standing    Number of Occurrences:   1    Order Specific Question:   Reason for exam:    Answer:   Assistance in needle guidance and placement for procedures requiring needle placement in or near specific anatomical locations not easily accessible without such assistance.  . Informed Consent Details: Physician/Practitioner Attestation; Transcribe to consent form and obtain patient signature    Provider Attestation: I, [place] Dossie Arbour, MD, (Pain Management Specialist), the physician/practitioner, attest that I have discussed with the patient the benefits, risks, side effects, alternatives, likelihood of achieving goals and potential  problems during recovery for the procedure that I have provided informed consent.    Scheduling Instructions:     Procedure: Therapeutic, bilateral, intra-articular Hyalgan knee injection     Indications: Chronic bilateral knee pain secondary to primary osteoarthritis of the knee     Note: Always confirm laterality of pain with Lauren [name], before procedure.     Transcribe to consent form and obtain patient signature.  . Provide equipment / supplies at bedside    Equipment required: Single use, disposable, "Block Tray"    Standing Status:   Standing    Number of Occurrences:   1    Order Specific Question:   Specify    Answer:   Block Tray   Chronic Opioid Analgesic:  Hydrocodone/APAP 5/325, 1 tab PO q 8 hrs (15 mg/day of hydrocodone) MME/day: 15 mg/day.   Medications ordered for procedure: Meds ordered this encounter  Medications  . lidocaine (PF) (XYLOCAINE) 1 % injection 5 mL  . ropivacaine (PF) 2 mg/mL (0.2%) (NAROPIN) injection 5 mL  . Sodium Hyaluronate SOSY 2 mL  . Sodium Hyaluronate SOSY 2 mL  . lidocaine (XYLOCAINE) 2 % (with pres) injection 400 mg   Medications administered: We administered lidocaine (PF), ropivacaine (PF) 2 mg/mL (0.2%), Sodium Hyaluronate, Sodium Hyaluronate, and lidocaine.  See the medical record for exact dosing, route, and time of administration.  Follow-up plan:   Return in about 2 weeks (around 12/05/2019) for Procedure (no sedation): (B) Hyalgan #3 under fluoroscopic guidance.       Considering: NOTE:NO Lumbar RFAuntil BMIis at or below 35. Diagnostic bilateral cervical facet NB Diagnostic bilateral cervical ESI Possible bilateral cervical facet RFA Diagnostic bilateral suprascapular NB Possible bilateral suprascapular nerve RFA Diagnostic bilateral LESI Possible bilateral lumbar facet RFA(NO Lumbar RFAuntil BMIis at or below 35.)   Palliative PRN treatment(s): Palliative bilateral IA Hyalgan knee injection S3N2(Fluoro  required)(1st - 61mo)(2nd - 3.9 mo) Palliative bilateral IA knee joint (steroid) #2  Diagnostic/therapeutic bilateral knee genicular NB #2 Diagnostic/therapeutic left IA wrist injection (MNB/TPI) #2 Diagnostic  bilateral IA shoulder injection #3 Diagnostic left suprascapular NB #2 Therapeutic left L4-5 LESI #4  Diagnostic right lumbar facet block #2     Recent Visits Date Type Provider Dept  11/07/19 Procedure visit Milinda Pointer, MD [facility]  Showing recent visits within past 90 days and meeting all other requirements   Today's Visits Date Type Provider Dept  11/21/19 Procedure visit Milinda Pointer, MD [facility]  Showing today's visits and meeting all other requirements   Future Appointments Date Type Provider Dept  12/05/19 Appointment Milinda Pointer, MD [facility]  Showing future appointments within next 90 days and meeting all other requirements   Disposition: Discharge home  Discharge (Date  Time): 11/21/2019; 0848 hrs.   Primary Care Physician: Lorelee Market, MD Location: [facility] Note by: Gaspar Cola, MD Date: 11/21/2019; Time: 9:06 AM  Disclaimer:  Medicine is not an Chief Strategy Officer. The only guarantee in medicine is that nothing is guaranteed. It is important to note that the decision to proceed with this intervention was based on the information collected from the patient. The Data and conclusions were drawn from the patient's questionnaire, the interview, and the physical examination. Because the information was provided in large part by the patient, it cannot be guaranteed that it has not been purposely or unconsciously manipulated. Every effort has been made to obtain as much relevant data as possible for this evaluation. It is important to note that the conclusions that lead to this procedure are derived in large part from the available data. Always take into account that the  treatment will also be dependent on availability of resources and existing treatment guidelines, considered by other Pain Management Practitioners as being common knowledge and practice, at the time of the intervention. For Medico-Legal purposes, it is also important to point out that variation in procedural techniques and pharmacological choices are the acceptable norm. The indications, contraindications, technique, and results of the above procedure should only be interpreted and judged by a Board-Certified Interventional Pain Specialist with extensive familiarity and expertise in the same exact procedure and technique.

## 2019-11-21 NOTE — Progress Notes (Signed)
Safety precautions to be maintained throughout the outpatient stay will include: orient to surroundings, keep bed in low position, maintain call bell within reach at all times, provide assistance with transfer out of bed and ambulation.  

## 2019-11-22 ENCOUNTER — Telehealth: Payer: Self-pay

## 2019-11-22 NOTE — Telephone Encounter (Signed)
Post procedure phone call. Unable to leave message, mailbox is full.  

## 2019-12-05 ENCOUNTER — Ambulatory Visit: Payer: Medicaid Other | Admitting: Pain Medicine

## 2019-12-12 ENCOUNTER — Ambulatory Visit (HOSPITAL_BASED_OUTPATIENT_CLINIC_OR_DEPARTMENT_OTHER): Payer: Medicaid Other | Admitting: Pain Medicine

## 2019-12-12 ENCOUNTER — Other Ambulatory Visit: Payer: Self-pay

## 2019-12-12 ENCOUNTER — Ambulatory Visit
Admission: RE | Admit: 2019-12-12 | Discharge: 2019-12-12 | Disposition: A | Discharge status: Home / Self Care | Payer: Medicaid Other | Source: Ambulatory Visit | Attending: Pain Medicine | Admitting: Pain Medicine

## 2019-12-12 ENCOUNTER — Encounter: Payer: Self-pay | Admitting: Pain Medicine

## 2019-12-12 VITALS — BP 125/72 | HR 81 | Temp 98.0°F | Resp 20 | Ht 64.0 in | Wt 270.0 lb

## 2019-12-12 DIAGNOSIS — M170 Bilateral primary osteoarthritis of knee: Secondary | ICD-10-CM | POA: Insufficient documentation

## 2019-12-12 DIAGNOSIS — M25561 Pain in right knee: Secondary | ICD-10-CM | POA: Diagnosis not present

## 2019-12-12 DIAGNOSIS — G8929 Other chronic pain: Secondary | ICD-10-CM

## 2019-12-12 DIAGNOSIS — M25562 Pain in left knee: Secondary | ICD-10-CM

## 2019-12-12 DIAGNOSIS — Z6841 Body Mass Index (BMI) 40.0 and over, adult: Secondary | ICD-10-CM | POA: Diagnosis present

## 2019-12-12 MED ORDER — SODIUM HYALURONATE (VISCOSUP) 20 MG/2ML IX SOSY
2.0000 mL | PREFILLED_SYRINGE | Freq: Once | INTRA_ARTICULAR | Status: AC
  Administered 2019-12-12: 2 mL via INTRA_ARTICULAR

## 2019-12-12 MED ORDER — ROPIVACAINE HCL 2 MG/ML IJ SOLN
INTRAMUSCULAR | Status: AC
  Filled 2019-12-12: qty 10

## 2019-12-12 MED ORDER — LIDOCAINE HCL 2 % IJ SOLN
20.0000 mL | Freq: Once | INTRAMUSCULAR | Status: AC
  Administered 2019-12-12: 400 mg

## 2019-12-12 MED ORDER — ROPIVACAINE HCL 2 MG/ML IJ SOLN
5.0000 mL | Freq: Once | INTRAMUSCULAR | Status: AC
  Administered 2019-12-12: 5 mL via INTRA_ARTICULAR

## 2019-12-12 MED ORDER — LIDOCAINE HCL (PF) 1 % IJ SOLN
5.0000 mL | Freq: Once | INTRAMUSCULAR | Status: AC
  Administered 2019-12-12: 5 mL

## 2019-12-12 MED ORDER — LIDOCAINE HCL 2 % IJ SOLN
INTRAMUSCULAR | Status: AC
  Filled 2019-12-12: qty 20

## 2019-12-12 MED ORDER — LIDOCAINE HCL (PF) 1 % IJ SOLN
INTRAMUSCULAR | Status: AC
  Filled 2019-12-12: qty 5

## 2019-12-12 NOTE — Patient Instructions (Signed)
____________________________________________________________________________________________  Post-Procedure Discharge Instructions  Instructions:  Apply ice:   Purpose: This will minimize any swelling and discomfort after procedure.   When: Day of procedure, as soon as you get home.  How: Fill a plastic sandwich bag with crushed ice. Cover it with a small towel and apply to injection site.  How long: (15 min on, 15 min off) Apply for 15 minutes then remove x 15 minutes.  Repeat sequence on day of procedure, until you go to bed.  Apply heat:   Purpose: To treat any soreness and discomfort from the procedure.  When: Starting the next day after the procedure.  How: Apply heat to procedure site starting the day following the procedure.  How long: May continue to repeat daily, until discomfort goes away.  Food intake: Start with clear liquids (like water) and advance to regular food, as tolerated.   Physical activities: Keep activities to a minimum for the first 8 hours after the procedure. After that, then as tolerated.  Driving: If you have received any sedation, be responsible and do not drive. You are not allowed to drive for 24 hours after having sedation.  Blood thinner: (Applies only to those taking blood thinners) You may restart your blood thinner 6 hours after your procedure.  Insulin: (Applies only to Diabetic patients taking insulin) As soon as you can eat, you may resume your normal dosing schedule.  Infection prevention: Keep procedure site clean and dry. Shower daily and clean area with soap and water.  Post-procedure Pain Diary: Extremely important that this be done correctly and accurately. Recorded information will be used to determine the next step in treatment. For the purpose of accuracy, follow these rules:  Evaluate only the area treated. Do not report or include pain from an untreated area. For the purpose of this evaluation, ignore all other areas of pain,  except for the treated area.  After your procedure, avoid taking a long nap and attempting to complete the pain diary after you wake up. Instead, set your alarm clock to go off every hour, on the hour, for the initial 8 hours after the procedure. Document the duration of the numbing medicine, and the relief you are getting from it.  Do not go to sleep and attempt to complete it later. It will not be accurate. If you received sedation, it is likely that you were given a medication that may cause amnesia. Because of this, completing the diary at a later time may cause the information to be inaccurate. This information is needed to plan your care.  Follow-up appointment: Keep your post-procedure follow-up evaluation appointment after the procedure (usually 2 weeks for most procedures, 6 weeks for radiofrequencies). DO NOT FORGET to bring you pain diary with you.   Expect: (What should I expect to see with my procedure?)  From numbing medicine (AKA: Local Anesthetics): Numbness or decrease in pain. You may also experience some weakness, which if present, could last for the duration of the local anesthetic.  Onset: Full effect within 15 minutes of injected.  Duration: It will depend on the type of local anesthetic used. On the average, 1 to 8 hours.   From steroids (Applies only if steroids were used): Decrease in swelling or inflammation. Once inflammation is improved, relief of the pain will follow.  Onset of benefits: Depends on the amount of swelling present. The more swelling, the longer it will take for the benefits to be seen. In some cases, up to 10 days.    Duration: Steroids will stay in the system x 2 weeks. Duration of benefits will depend on multiple posibilities including persistent irritating factors.  Side-effects: If present, they may typically last 2 weeks (the duration of the steroids).  Frequent: Cramps (if they occur, drink Gatorade and take over-the-counter Magnesium 450-500 mg  once to twice a day); water retention with temporary weight gain; increases in blood sugar; decreased immune system response; increased appetite.  Occasional: Facial flushing (red, warm cheeks); mood swings; menstrual changes.  Uncommon: Long-term decrease or suppression of natural hormones; bone thinning. (These are more common with higher doses or more frequent use. This is why we prefer that our patients avoid having any injection therapies in other practices.)   Very Rare: Severe mood changes; psychosis; aseptic necrosis.  From procedure: Some discomfort is to be expected once the numbing medicine wears off. This should be minimal if ice and heat are applied as instructed.  Call if: (When should I call?)  You experience numbness and weakness that gets worse with time, as opposed to wearing off.  New onset bowel or bladder incontinence. (Applies only to procedures done in the spine)  Emergency Numbers:  Durning business hours (Monday - Thursday, 8:00 AM - 4:00 PM) (Friday, 9:00 AM - 12:00 Noon): [phone]  After hours: [phone]  NOTE: If you are having a problem and are unable connect with, or to talk to a provider, then go to your nearest urgent care or emergency department. If the problem is serious and urgent, please call 911. ____________________________________________________________________________________________   ____________________________________________________________________________________________  Preparing for your procedure (without sedation)  Procedure appointments are limited to planned procedures: . No Prescription Refills. . No disability issues will be discussed. . No medication changes will be discussed.  Instructions: . Oral Intake: Do not eat or drink anything for at least 3 hours prior to your procedure. (Exception: Blood Pressure Medication. See below.) . Transportation: Unless otherwise stated by your physician, you may drive yourself  after the procedure. . Blood Pressure Medicine: Do not forget to take your blood pressure medicine with a sip of water the morning of the procedure. If your Diastolic (lower reading)is above 100 mmHg, elective cases will be cancelled/rescheduled. . Blood thinners: These will need to be stopped for procedures. Notify our staff if you are taking any blood thinners. Depending on which one you take, there will be specific instructions on how and when to stop it. . Diabetics on insulin: Notify the staff so that you can be scheduled 1st case in the morning. If your diabetes requires high dose insulin, take only  of your normal insulin dose the morning of the procedure and notify the staff that you have done so. . Preventing infections: Shower with an antibacterial soap the morning of your procedure.  . Build-up your immune system: Take 1000 mg of Vitamin C with every meal (3 times a day) the day prior to your procedure. [facility] Antibiotics: Inform the staff if you have a condition or reason that requires you to take antibiotics before dental procedures. . Pregnancy: If you are pregnant, call and cancel the procedure. . Sickness: If you have a cold, fever, or any active infections, call and cancel the procedure. . Arrival: You must be in the facility at least 30 minutes prior to your scheduled procedure. . Children: Do not bring any children with you. . Dress appropriately: Bring dark clothing that you would not mind if they get stained. . Valuables: Do not bring any jewelry  or valuables.  Reasons to call and reschedule or cancel your procedure: (Following these recommendations will minimize the risk of a serious complication.) . Surgeries: Avoid having procedures within 2 weeks of any surgery. (Avoid for 2 weeks before or after any surgery). . Flu Shots: Avoid having procedures within 2 weeks of a flu shots or . (Avoid for 2 weeks before or after immunizations). . Barium: Avoid having a procedure within 7-10  days after having had a radiological study involving the use of radiological contrast. (Myelograms, Barium swallow or enema study). . Heart attacks: Avoid any elective procedures or surgeries for the initial 6 months after a "Myocardial Infarction" (Heart Attack). . Blood thinners: It is imperative that you stop these medications before procedures. Let us know if you if you take any blood thinner.  . Infection: Avoid procedures during or within two weeks of an infection (including chest colds or gastrointestinal problems). Symptoms associated with infections include: Localized redness, fever, chills, night sweats or profuse sweating, burning sensation when voiding, cough, congestion, stuffiness, runny nose, sore throat, diarrhea, nausea, vomiting, cold or Flu symptoms, recent or current infections. It is specially important if the infection is over the area that we intend to treat. . Heart and lung problems: Symptoms that may suggest an active cardiopulmonary problem include: cough, chest pain, breathing difficulties or shortness of breath, dizziness, ankle swelling, uncontrolled high or unusually low blood pressure, and/or palpitations. If you are experiencing any of these symptoms, cancel your procedure and contact your primary care physician for an evaluation.  Remember:  Regular Business hours are:  Monday to Thursday 8:00 AM to 4:00 PM  Provider's Schedule: Mida Cory, MD:  Procedure days: Tuesday and Thursday 7:30 AM to 4:00 PM  Bilal Lateef, MD:  Procedure days: Monday and Wednesday 7:30 AM to 4:00 PM ____________________________________________________________________________________________    

## 2019-12-12 NOTE — Progress Notes (Signed)
Safety precautions to be maintained throughout the outpatient stay will include: orient to surroundings, keep bed in low position, maintain call bell within reach at all times, provide assistance with transfer out of bed and ambulation.  

## 2019-12-12 NOTE — Progress Notes (Signed)
PROVIDER NOTE: Information contained herein reflects review and annotations entered in association with encounter. Interpretation of such information and data should be left to medically-trained personnel. Information provided to patient can be located elsewhere in the medical record under "Patient Instructions". Document created using STT-dictation technology, any transcriptional errors that may result from process are unintentional.    Patient: Lauren [name]  Service Category: Procedure  Provider: Gaspar Cola, MD  DOB: Oct 05, 1975  DOS: 12/12/2019  Location: [facility]  MRN: [id number]  Setting: Ambulatory - outpatient  Referring Provider: Lorelee Market, MD  Type: Established Patient  Specialty: Interventional Pain Management  PCP: Lorelee Market, MD   Primary Reason for Visit: Interventional Pain Management Treatment. CC: Knee Pain (left)  Procedure:          Anesthesia, Analgesia, Anxiolysis:  Type: Therapeutic Intra-Articular Hyalgan Knee Injection #3  Region: Lateral infrapatellar Knee Region Level: Knee Joint Laterality: Bilateral  Type: Local Anesthesia Indication(s): Analgesia         Local Anesthetic: Lidocaine 1-2% Route: Infiltration ([place]/IM) IV Access: Declined Sedation: Declined   Position: Sitting   Indications: 1. Chronic knee pain (Primary Area of Pain) (Bilateral) (L>R)   2. Osteoarthritis of knees (Bilateral)   3. BMI 50.0-59.9, adult (HCC)    Pain Score: Pre-procedure: 7 /10 Post-procedure: 1 /10   Pre-op Assessment:  Ms. Stehlin is a 44 y.o. (year old), female patient, seen today for interventional treatment. She  has a past surgical history that includes Rotator cuff repair; Knee surgery; Cholecystectomy; Cesarean section (2010); Cesarean section (2011); Tonsillectomy; Cardiac electrophysiology study and ablation (01/18/2018); and Excision mass neck (Left, 10/11/2018). Ms. Dewitz has a current medication list which includes  the following prescription(s): albuterol, aspirin ec, beclomethasone, diltiazem, diltiazem, esomeprazole, ferrous sulfate, furosemide, hydrocodone-acetaminophen, [START ON 01/07/2020] hydrocodone-acetaminophen, [START ON 02/06/2020] hydrocodone-acetaminophen, ibuprofen, losartan-hydrochlorothiazide, medroxyprogesterone acetate, metoprolol succinate, fish oil, pregabalin, stiolto respimat, fluticasone, and hydrocodone-acetaminophen. Her primarily concern today is the Knee Pain (left)  Initial Vital Signs:  Pulse/HCG Rate: 74  Temp: 98 F (36.7 C) Resp: 18 BP: 99/61 SpO2: 98 %  BMI: Estimated body mass index is 46.35 kg/m as calculated from the following:   Height as of this encounter: 5\' 4"  (1.626 m).   Weight as of this encounter: 270 lb (122.5 kg).  Risk Assessment: Allergies: Reviewed. She is allergic to nucynta [tapentadol] and tape.  Allergy Precautions: None required Coagulopathies: Reviewed. None identified.  Blood-thinner therapy: None at this time Active Infection(s): Reviewed. None identified. Ms. Follmer is afebrile  Site Confirmation: Ms. Geffrard was asked to confirm the procedure and laterality before marking the site Procedure checklist: Completed Consent: Before the procedure and under the influence of no sedative(s), amnesic(s), or anxiolytics, the patient was informed of the treatment options, risks and possible complications. To fulfill our ethical and legal obligations, as recommended by the American Medical Association's Code of Ethics, I have informed the patient of my clinical impression; the nature and purpose of the treatment or procedure; the risks, benefits, and possible complications of the intervention; the alternatives, including doing nothing; the risk(s) and benefit(s) of the alternative treatment(s) or procedure(s); and the risk(s) and benefit(s) of doing nothing. The patient was provided information about the general risks and possible complications  associated with the procedure. These may include, but are not limited to: failure to achieve desired goals, infection, bleeding, organ or nerve damage, allergic reactions, paralysis, and death. In addition, the patient was informed of those risks and complications associated to the  procedure, such as failure to decrease pain; infection; bleeding; organ or nerve damage with subsequent damage to sensory, motor, and/or autonomic systems, resulting in permanent pain, numbness, and/or weakness of one or several areas of the body; allergic reactions; (i.e.: anaphylactic reaction); and/or death. Furthermore, the patient was informed of those risks and complications associated with the medications. These include, but are not limited to: allergic reactions (i.e.: anaphylactic or anaphylactoid reaction(s)); adrenal axis suppression; blood sugar elevation that in diabetics may result in ketoacidosis or comma; water retention that in patients with history of congestive heart failure may result in shortness of breath, pulmonary edema, and decompensation with resultant heart failure; weight gain; swelling or edema; medication-induced neural toxicity; particulate matter embolism and blood vessel occlusion with resultant organ, and/or nervous system infarction; and/or aseptic necrosis of one or more joints. Finally, the patient was informed that Medicine is not an exact science; therefore, there is also the possibility of unforeseen or unpredictable risks and/or possible complications that may result in a catastrophic outcome. The patient indicated having understood very clearly. We have given the patient no guarantees and we have made no promises. Enough time was given to the patient to ask questions, all of which were answered to the patient's satisfaction. Ms. Plescia has indicated that she wanted to continue with the procedure. Attestation: I, the ordering provider, attest that I have discussed with the patient the  benefits, risks, side-effects, alternatives, likelihood of achieving goals, and potential problems during recovery for the procedure that I have provided informed consent. Date  Time: 12/12/2019  7:57 AM  Pre-Procedure Preparation:  Monitoring: As per clinic protocol. Respiration, ETCO2, SpO2, BP, heart rate and rhythm monitor placed and checked for adequate function Safety Precautions: Patient was assessed for positional comfort and pressure points before starting the procedure. Time-out: I initiated and conducted the "Time-out" before starting the procedure, as per protocol. The patient was asked to participate by confirming the accuracy of the "Time Out" information. Verification of the correct person, site, and procedure were performed and confirmed by me, the nursing staff, and the patient. "Time-out" conducted as per Joint Commission's Universal Protocol (UP.01.01.01). Time: 0831  Description of Procedure:          Target Area: Knee Joint Approach: Just above the Lateral tibial plateau, lateral to the infrapatellar tendon. Area Prepped: Entire knee area, from the mid-thigh to the mid-shin. DuraPrep (Iodine Povacrylex [0.7% available iodine] and Isopropyl Alcohol, 74% w/w) Safety Precautions: Aspiration looking for blood return was conducted prior to all injections. At no point did we inject any substances, as a needle was being advanced. No attempts were made at seeking any paresthesias. Safe injection practices and needle disposal techniques used. Medications properly checked for expiration dates. SDV (single dose vial) medications used. Description of the Procedure: Protocol guidelines were followed. The patient was placed in position over the fluoroscopy table. The target area was identified and the area prepped in the usual manner. Skin & deeper tissues infiltrated with local anesthetic. Appropriate amount of time allowed to pass for local anesthetics to take effect. The procedure needles  were then advanced to the target area. Proper needle placement secured. Negative aspiration confirmed. Solution injected in intermittent fashion, asking for systemic symptoms every 0.5cc of injectate. The needles were then removed and the area cleansed, making sure to leave some of the prepping solution back to take advantage of its long term bactericidal properties. Vitals:   12/12/19 0757 12/12/19 0820 12/12/19 0830 12/12/19 0840  BP: 99/61 Marland Kitchen)  100/49 127/76 125/72  Pulse: 74 76 80 81  Resp: 18 (!) 25 (!) 21 20  Temp: 98 F (36.7 C)     SpO2: 98% 100% 99% 98%  Weight:      Height:        Start Time: 0831 hrs. End Time: 0838 hrs. Materials:  Needle(s) Type: Regular needle Gauge: 22G Length: 3.5-in Medication(s): Please see orders for medications and dosing details.  Imaging Guidance:          Type of Imaging Technique: Fluoroscopy Guidance (Non-spinal) Indication(s): Morbid obesity. Assistance in needle guidance and placement for procedures requiring needle placement in or near specific anatomical locations impossible to access without such assistance. Exposure Time: Please see nurses notes. Contrast: None used. Fluoroscopic Guidance: I was personally present during the use of fluoroscopy. "Tunnel Vision Technique" used to obtain the best possible view of the target area. Parallax error corrected before commencing the procedure. "Direction-depth-direction" technique used to introduce the needle under continuous pulsed fluoroscopy. Once target was reached, antero-posterior, oblique, and lateral fluoroscopic projection used confirm needle placement in all planes. Images permanently stored in EMR. Ultrasound Guidance: N/A Interpretation: No contrast injected. I personally interpreted the imaging intraoperatively. Adequate needle placement confirmed in multiple planes. Permanent images saved into the patient's record.  Antibiotic Prophylaxis:   Anti-infectives (From admission, onward)    None     Indication(s): None identified  Post-operative Assessment:  Post-procedure Vital Signs:  Pulse/HCG Rate: 81  Temp: 98 F (36.7 C) Resp: 20 BP: 125/72 SpO2: 98 %  EBL: None  Complications: No immediate post-treatment complications observed by team, or reported by patient.  Note: The patient tolerated the entire procedure well. A repeat set of vitals were taken after the procedure and the patient was kept under observation following institutional policy, for this type of procedure. Post-procedural neurological assessment was performed, showing return to baseline, prior to discharge. The patient was provided with post-procedure discharge instructions, including a section on how to identify potential problems. Should any problems arise concerning this procedure, the patient was given instructions to immediately contact us, at any time, without hesitation. In any case, we plan to contact the patient by telephone for a follow-up status report regarding this interventional procedure.  Comments:  No additional relevant information.  Plan of Care  Orders:  Orders Placed This Encounter  Procedures  . KNEE INJECTION    Hyalgan knee injection to be done by MD.    Scheduling Instructions:     Procedure: Intra-articular Hyalgan Knee injection #3     Side(s): Bilateral Knee     Sedation: None     Timeframe: Today    Order Specific Question:   Where will this procedure be performed?    Answer:   [facility]  . KNEE INJECTION    Hyalgan knee injection. Please order Hyalgan.    Standing Status:   Future    Standing Expiration Date:   01/11/2020    Scheduling Instructions:     Procedure: Intra-articular Hyalgan Knee injection #4     Side: Bilateral     Sedation: None     Timeframe: in two (2) weeks    Order Specific Question:   Where will this procedure be performed?    Answer:   [facility]  . DG PAIN CLIN[facility] C-ARM 1-60 MIN NO REPORT    Intraoperative  interpretation by procedural physician at [place].    Standing Status:   Standing    Number  of Occurrences:   1    Order Specific Question:   Reason for exam:    Answer:   Assistance in needle guidance and placement for procedures requiring needle placement in or near specific anatomical locations not easily accessible without such assistance.  . Informed Consent Details: Physician/Practitioner Attestation; Transcribe to consent form and obtain patient signature    Provider Attestation: I, [place] Dossie Arbour, MD, (Pain Management Specialist), the physician/practitioner, attest that I have discussed with the patient the benefits, risks, side effects, alternatives, likelihood of achieving goals and potential problems during recovery for the procedure that I have provided informed consent.    Scheduling Instructions:     Procedure: Therapeutic, bilateral, intra-articular Hyalgan knee injection     Indications: Chronic bilateral knee pain secondary to primary osteoarthritis of the knee     Note: Always confirm laterality of pain with Ms. Schwenke, before procedure.     Transcribe to consent form and obtain patient signature.  . Provide equipment / supplies at bedside    Equipment required: Single use, disposable, "Block Tray"    Standing Status:   Standing    Number of Occurrences:   1    Order Specific Question:   Specify    Answer:   Block Tray   Chronic Opioid Analgesic:  Hydrocodone/APAP 5/325, 1 tab PO q 8 hrs (15 mg/day of hydrocodone) MME/day: 15 mg/day.   Medications ordered for procedure: Meds ordered this encounter  Medications  . lidocaine (XYLOCAINE) 2 % (with pres) injection 400 mg  . lidocaine (PF) (XYLOCAINE) 1 % injection 5 mL  . ropivacaine (PF) 2 mg/mL (0.2%) (NAROPIN) injection 5 mL  . Sodium Hyaluronate SOSY 2 mL  . Sodium Hyaluronate SOSY 2 mL   Medications administered: We administered lidocaine, lidocaine (PF), ropivacaine (PF) 2 mg/mL (0.2%),  Sodium Hyaluronate, and Sodium Hyaluronate.  See the medical record for exact dosing, route, and time of administration.  Follow-up plan:   Return in about 2 weeks (around 12/26/2019) for Procedure (no sedation): (B) Hyalgan #4 under fluoro.       Considering: NOTE:NO Lumbar RFAuntil BMIis at or below 35. Diagnostic bilateral cervical facet NB Diagnostic bilateral cervical ESI Possible bilateral cervical facet RFA Diagnostic bilateral suprascapular NB Possible bilateral suprascapular nerve RFA Diagnostic bilateral LESI Possible bilateral lumbar facet RFA(NO Lumbar RFAuntil BMIis at or below 35.)   Palliative PRN treatment(s): Palliative bilateral IA Hyalgan knee injection S3N2(Fluoro required)(1st - 31mo)(2nd - 3.9 mo) Palliative bilateral IA knee joint (steroid) #2  Diagnostic/therapeutic bilateral knee genicular NB #2 Diagnostic/therapeutic left IA wrist injection (MNB/TPI) #2 Diagnostic bilateral IA shoulder injection #3 Diagnostic left suprascapular NB #2 Therapeutic left L4-5 LESI #4  Diagnostic right lumbar facet block #2      Recent Visits Date Type Provider Dept  11/21/19 Procedure visit Milinda Pointer, MD [facility]  11/07/19 Procedure visit Milinda Pointer, MD [facility]  Showing recent visits within past 90 days and meeting all other requirements   Today's Visits Date Type Provider Dept  12/12/19 Procedure visit Milinda Pointer, MD [facility]  Showing today's visits and meeting all other requirements   Future Appointments Date Type Provider Dept  01/07/20 Appointment Milinda Pointer, MD [facility]  Showing future appointments within next 90 days and meeting all other requirements   Disposition: Discharge home  Discharge (Date  Time): 12/12/2019; 0845 hrs.   Primary Care Physician: Lorelee Market, MD Location: [facility] Note by: Gaspar Cola,  MD Date: 12/12/2019; Time: 9:47 AM  Disclaimer:  Medicine is not an Chief Strategy Officer. The only guarantee in medicine is that nothing is guaranteed. It is important to note that the decision to proceed with this intervention was based on the information collected from the patient. The Data and conclusions were drawn from the patient's questionnaire, the interview, and the physical examination. Because the information was provided in large part by the patient, it cannot be guaranteed that it has not been purposely or unconsciously manipulated. Every effort has been made to obtain as much relevant data as possible for this evaluation. It is important to note that the conclusions that lead to this procedure are derived in large part from the available data. Always take into account that the treatment will also be dependent on availability of resources and existing treatment guidelines, considered by other Pain Management Practitioners as being common knowledge and practice, at the time of the intervention. For Medico-Legal purposes, it is also important to point out that variation in procedural techniques and pharmacological choices are the acceptable norm. The indications, contraindications, technique, and results of the above procedure should only be interpreted and judged by a Board-Certified Interventional Pain Specialist with extensive familiarity and expertise in the same exact procedure and technique.

## 2019-12-13 ENCOUNTER — Telehealth: Payer: Self-pay | Admitting: *Deleted

## 2019-12-13 NOTE — Telephone Encounter (Signed)
Attempted to call patient, mailbox is full.  This is re; procedure on yesterday.

## 2020-01-07 ENCOUNTER — Ambulatory Visit
Admission: RE | Admit: 2020-01-07 | Discharge: 2020-01-07 | Disposition: A | Discharge status: Home / Self Care | Payer: Medicaid Other | Source: Ambulatory Visit | Attending: Pain Medicine | Admitting: Pain Medicine

## 2020-01-07 ENCOUNTER — Encounter: Payer: Self-pay | Admitting: Pain Medicine

## 2020-01-07 ENCOUNTER — Ambulatory Visit (HOSPITAL_BASED_OUTPATIENT_CLINIC_OR_DEPARTMENT_OTHER): Payer: Medicaid Other | Admitting: Pain Medicine

## 2020-01-07 ENCOUNTER — Other Ambulatory Visit: Payer: Self-pay

## 2020-01-07 VITALS — BP 130/91 | HR 81 | Temp 98.7°F | Resp 20 | Ht 64.0 in | Wt 207.0 lb

## 2020-01-07 DIAGNOSIS — M25561 Pain in right knee: Secondary | ICD-10-CM

## 2020-01-07 DIAGNOSIS — G8929 Other chronic pain: Secondary | ICD-10-CM | POA: Diagnosis present

## 2020-01-07 DIAGNOSIS — M170 Bilateral primary osteoarthritis of knee: Secondary | ICD-10-CM | POA: Insufficient documentation

## 2020-01-07 DIAGNOSIS — M25562 Pain in left knee: Secondary | ICD-10-CM

## 2020-01-07 DIAGNOSIS — Z6841 Body Mass Index (BMI) 40.0 and over, adult: Secondary | ICD-10-CM | POA: Insufficient documentation

## 2020-01-07 MED ORDER — LIDOCAINE HCL (PF) 1 % IJ SOLN
5.0000 mL | Freq: Once | INTRAMUSCULAR | Status: AC
  Administered 2020-01-07: 5 mL
  Filled 2020-01-07: qty 5

## 2020-01-07 MED ORDER — ROPIVACAINE HCL 2 MG/ML IJ SOLN
5.0000 mL | Freq: Once | INTRAMUSCULAR | Status: AC
  Administered 2020-01-07: 5 mL via INTRA_ARTICULAR
  Filled 2020-01-07: qty 10

## 2020-01-07 MED ORDER — SODIUM HYALURONATE (VISCOSUP) 20 MG/2ML IX SOSY
2.0000 mL | PREFILLED_SYRINGE | Freq: Once | INTRA_ARTICULAR | Status: AC
  Administered 2020-01-07: 2 mL via INTRA_ARTICULAR

## 2020-01-07 MED ORDER — LIDOCAINE HCL 2 % IJ SOLN
20.0000 mL | Freq: Once | INTRAMUSCULAR | Status: AC
  Administered 2020-01-07: 400 mg
  Filled 2020-01-07: qty 40

## 2020-01-07 NOTE — Progress Notes (Signed)
PROVIDER NOTE: Information contained herein reflects review and annotations entered in association with encounter. Interpretation of such information and data should be left to medically-trained personnel. Information provided to patient can be located elsewhere in the medical record under "Patient Instructions". Document created using STT-dictation technology, any transcriptional errors that may result from process are unintentional.    Patient: Lauren [name]  Service Category: Procedure  Provider: Gaspar Cola, MD  DOB: 1976-04-15  DOS: 01/07/2020  Location: [facility]  MRN: [id number]  Setting: Ambulatory - outpatient  Referring Provider: Lorelee Market, MD  Type: Established Patient  Specialty: Interventional Pain Management  PCP: Lauren Market, MD   Primary Reason for Visit: Interventional Pain Management Treatment. CC: Knee Pain (bilateral)  Procedure:          Anesthesia, Analgesia, Anxiolysis:  Type: Palliative Intra-Articular Hyalgan Knee Injection #4  Region: Lateral infrapatellar Knee Region Level: Knee Joint Laterality: Bilateral  Type: Local Anesthesia Indication(s): Analgesia         Local Anesthetic: Lidocaine 1-2% Route: Infiltration ([place]/IM) IV Access: Declined Sedation: Declined   Position: Supine w/ knee bent 20 to 30 degrees   Indications: 1. Chronic knee pain (Primary Area of Pain) (Bilateral) (L>R)   2. Osteoarthritis of knees (Bilateral)   3. Morbid obesity with BMI of 50.0-59.9, adult (HCC)    Pain Score: Pre-procedure: 8 /10 Post-procedure: (right 0, left 8)/10   Pre-op Assessment:  Lauren [name] is a 44 y.o. (year old), female patient, seen today for interventional treatment. She  has a past surgical history that includes Rotator cuff repair; Knee surgery; Cholecystectomy; Cesarean section (2010); Cesarean section (2011); Tonsillectomy; Cardiac electrophysiology study and ablation (01/18/2018); and Excision mass neck (Left,  10/11/2018). Lauren [name] has a current medication list which includes the following prescription(s): albuterol, aspirin ec, beclomethasone, diltiazem, diltiazem, esomeprazole, ferrous sulfate, furosemide, hydrocodone-acetaminophen, [START ON 02/06/2020] hydrocodone-acetaminophen, ibuprofen, losartan-hydrochlorothiazide, medroxyprogesterone acetate, metoprolol succinate, fish oil, pregabalin, stiolto respimat, and fluticasone. Her primarily concern today is the Knee Pain (bilateral)  Initial Vital Signs:  Pulse/HCG Rate: 81ECG Heart Rate: 82 Temp: 98.7 F (37.1 C) Resp: 16 BP: 138/87 SpO2: 98 %  BMI: Estimated body mass index is 35.53 kg/m as calculated from the following:   Height as of this encounter: 5\' 4"  (1.626 m).   Weight as of this encounter: 207 lb (93.9 kg).  Risk Assessment: Allergies: Reviewed. She is allergic to nucynta [tapentadol] and tape.  Allergy Precautions: None required Coagulopathies: Reviewed. None identified.  Blood-thinner therapy: None at this time Active Infection(s): Reviewed. None identified. Lauren [name] is afebrile  Site Confirmation: Lauren [name] was asked to confirm the procedure and laterality before marking the site Procedure checklist: Completed Consent: Before the procedure and under the influence of no sedative(s), amnesic(s), or anxiolytics, the patient was informed of the treatment options, risks and possible complications. To fulfill our ethical and legal obligations, as recommended by the American Medical Association's Code of Ethics, I have informed the patient of my clinical impression; the nature and purpose of the treatment or procedure; the risks, benefits, and possible complications of the intervention; the alternatives, including doing nothing; the risk(s) and benefit(s) of the alternative treatment(s) or procedure(s); and the risk(s) and benefit(s) of doing nothing. The patient was provided information about the general risks and possible  complications associated with the procedure. These may include, but are not limited to: failure to achieve desired goals, infection, bleeding, organ or nerve damage, allergic reactions, paralysis, and death. In addition, the patient  was informed of those risks and complications associated to the procedure, such as failure to decrease pain; infection; bleeding; organ or nerve damage with subsequent damage to sensory, motor, and/or autonomic systems, resulting in permanent pain, numbness, and/or weakness of one or several areas of the body; allergic reactions; (i.e.: anaphylactic reaction); and/or death. Furthermore, the patient was informed of those risks and complications associated with the medications. These include, but are not limited to: allergic reactions (i.e.: anaphylactic or anaphylactoid reaction(s)); adrenal axis suppression; blood sugar elevation that in diabetics may result in ketoacidosis or comma; water retention that in patients with history of congestive heart failure may result in shortness of breath, pulmonary edema, and decompensation with resultant heart failure; weight gain; swelling or edema; medication-induced neural toxicity; particulate matter embolism and blood vessel occlusion with resultant organ, and/or nervous system infarction; and/or aseptic necrosis of one or more joints. Finally, the patient was informed that Medicine is not an exact science; therefore, there is also the possibility of unforeseen or unpredictable risks and/or possible complications that may result in a catastrophic outcome. The patient indicated having understood very clearly. We have given the patient no guarantees and we have made no promises. Enough time was given to the patient to ask questions, all of which were answered to the patient's satisfaction. Lauren [name] has indicated that she wanted to continue with the procedure. Attestation: I, the ordering provider, attest that I have discussed with the  patient the benefits, risks, side-effects, alternatives, likelihood of achieving goals, and potential problems during recovery for the procedure that I have provided informed consent. Date  Time: 01/07/2020  8:06 AM  Pre-Procedure Preparation:  Monitoring: As per clinic protocol. Respiration, ETCO2, SpO2, BP, heart rate and rhythm monitor placed and checked for adequate function Safety Precautions: Patient was assessed for positional comfort and pressure points before starting the procedure. Time-out: I initiated and conducted the "Time-out" before starting the procedure, as per protocol. The patient was asked to participate by confirming the accuracy of the "Time Out" information. Verification of the correct person, site, and procedure were performed and confirmed by me, the nursing staff, and the patient. "Time-out" conducted as per Joint Commission's Universal Protocol (UP.01.01.01). Time: 0832  Description of Procedure:          Target Area: Knee Joint Approach: Just above the Lateral tibial plateau, lateral to the infrapatellar tendon. Area Prepped: Entire knee area, from the mid-thigh to the mid-shin. DuraPrep (Iodine Povacrylex [0.7% available iodine] and Isopropyl Alcohol, 74% w/w) Safety Precautions: Aspiration looking for blood return was conducted prior to all injections. At no point did we inject any substances, as a needle was being advanced. No attempts were made at seeking any paresthesias. Safe injection practices and needle disposal techniques used. Medications properly checked for expiration dates. SDV (single dose vial) medications used. Description of the Procedure: Protocol guidelines were followed. The patient was placed in position over the fluoroscopy table. The target area was identified and the area prepped in the usual manner. Skin & deeper tissues infiltrated with local anesthetic. Appropriate amount of time allowed to pass for local anesthetics to take effect. The  procedure needles were then advanced to the target area. Proper needle placement secured. Negative aspiration confirmed. Solution injected in intermittent fashion, asking for systemic symptoms every 0.5cc of injectate. The needles were then removed and the area cleansed, making sure to leave some of the prepping solution back to take advantage of its long term bactericidal properties. Vitals:   01/07/20  0805 01/07/20 0829 01/07/20 0835 01/07/20 0839  BP: 138/87 139/79 (!) 146/79 (!) 130/91  Pulse: 81     Resp: 16 20 (!) 22 20  Temp: 98.7 F (37.1 C)     TempSrc: Temporal     SpO2: 98% 100% 99% 100%  Weight: 207 lb (93.9 kg)     Height: 5\' 4"  (1.626 m)       Start Time: 0832 hrs. End Time: 0837 hrs. Materials:  Needle(s) Type: Regular needle Gauge: 22G Length: 3.5-in Medication(s): Please see orders for medications and dosing details.  Imaging Guidance:          Type of Imaging Technique: Fluoroscopy Guidance (Non-spinal) Indication(s): Morbid obesity. Assistance in needle guidance and placement for procedures requiring needle placement in or near specific anatomical locations impossible to access without such assistance. Exposure Time: Please see nurses notes. Contrast: None used. Fluoroscopic Guidance: I was personally present during the use of fluoroscopy. "Tunnel Vision Technique" used to obtain the best possible view of the target area. Parallax error corrected before commencing the procedure. "Direction-depth-direction" technique used to introduce the needle under continuous pulsed fluoroscopy. Once target was reached, antero-posterior, oblique, and lateral fluoroscopic projection used confirm needle placement in all planes. Images permanently stored in EMR. Ultrasound Guidance: N/A Interpretation: I personally interpreted the imaging intraoperatively. Adequate needle placement confirmed in multiple planes. Appropriate spread of contrast into desired area was observed. No evidence of  afferent or efferent intravascular uptake. No intrathecal or subarachnoid spread observed. Permanent images saved into the patient's record.  Antibiotic Prophylaxis:   Anti-infectives (From admission, onward)   None     Indication(s): None identified  Post-operative Assessment:  Post-procedure Vital Signs:  Pulse/HCG Rate: 8190 Temp: 98.7 F (37.1 C) Resp: 20 BP: (!) 130/91 SpO2: 100 %  EBL: None  Complications: No immediate post-treatment complications observed by team, or reported by patient.  Note: The patient tolerated the entire procedure well. A repeat set of vitals were taken after the procedure and the patient was kept under observation following institutional policy, for this type of procedure. Post-procedural neurological assessment was performed, showing return to baseline, prior to discharge. The patient was provided with post-procedure discharge instructions, including a section on how to identify potential problems. Should any problems arise concerning this procedure, the patient was given instructions to immediately contact us, at any time, without hesitation. In any case, we plan to contact the patient by telephone for a follow-up status report regarding this interventional procedure.  Comments:  No additional relevant information.  Plan of Care  Orders:  Orders Placed This Encounter  Procedures  . KNEE INJECTION    Hyalgan knee injection to be done by MD.    Scheduling Instructions:     Procedure: Intra-articular Hyalgan Knee injection #4     Side(s): Bilateral Knee     Sedation: None     Timeframe: Today    Order Specific Question:   Where will this procedure be performed?    Answer:   [facility]  . KNEE INJECTION    Hyalgan knee injection. Please order Hyalgan.    Standing Status:   Future    Standing Expiration Date:   02/06/2020    Scheduling Instructions:     Procedure: Intra-articular Hyalgan Knee injection #5     Side: Bilateral      Sedation: None     Timeframe: in two (2) weeks    Order Specific Question:   Where will this procedure be performed?  Answer:   [facility]  . DG PAIN CLINIC C-ARM 1-60 MIN NO REPORT    Intraoperative interpretation by procedural physician at [place].    Standing Status:   Standing    Number of Occurrences:   1    Order Specific Question:   Reason for exam:    Answer:   Assistance in needle guidance and placement for procedures requiring needle placement in or near specific anatomical locations not easily accessible without such assistance.  . Informed Consent Details: Physician/Practitioner Attestation; Transcribe to consent form and obtain patient signature    Provider Attestation: I, [place] Dossie Arbour, MD, (Pain Management Specialist), the physician/practitioner, attest that I have discussed with the patient the benefits, risks, side effects, alternatives, likelihood of achieving goals and potential problems during recovery for the procedure that I have provided informed consent.    Scheduling Instructions:     Procedure: Therapeutic, bilateral, intra-articular Hyalgan knee injection     Indications: Chronic bilateral knee pain secondary to primary osteoarthritis of the knee     Note: Always confirm laterality of pain with Ms. Battey, before procedure.     Transcribe to consent form and obtain patient signature.   Chronic Opioid Analgesic:  Hydrocodone/APAP 5/325, 1 tab PO q 8 hrs (15 mg/day of hydrocodone) MME/day: 15 mg/day.   Medications ordered for procedure: Meds ordered this encounter  Medications  . lidocaine (XYLOCAINE) 2 % (with pres) injection 400 mg  . lidocaine (PF) (XYLOCAINE) 1 % injection 5 mL  . ropivacaine (PF) 2 mg/mL (0.2%) (NAROPIN) injection 5 mL  . Sodium Hyaluronate SOSY 2 mL  . Sodium Hyaluronate SOSY 2 mL   Medications administered: We administered lidocaine, lidocaine (PF), ropivacaine (PF) 2 mg/mL (0.2%), Sodium Hyaluronate,  and Sodium Hyaluronate.  See the medical record for exact dosing, route, and time of administration.  Follow-up plan:   Return in about 2 weeks (around 01/21/2020) for Procedure (no sedation): (B) Hyalgan #5 (w/ Fluoro).       Considering: NOTE:NO Lumbar RFAuntil BMIis at or below 35. Diagnostic bilateral cervical facet NB Diagnostic bilateral cervical ESI Possible bilateral cervical facet RFA Diagnostic bilateral suprascapular NB Possible bilateral suprascapular nerve RFA Diagnostic bilateral LESI Possible bilateral lumbar facet RFA(NO Lumbar RFAuntil BMIis at or below 35.)   Palliative PRN treatment(s): Palliative bilateral IA Hyalgan knee injection S3N2(Fluoro required)(1st - 24mo)(2nd - 3.9 mo) Palliative bilateral IA knee joint (steroid) #2  Diagnostic/therapeutic bilateral knee genicular NB #2 Diagnostic/therapeutic left IA wrist injection (MNB/TPI) #2 Diagnostic bilateral IA shoulder injection #3 Diagnostic left suprascapular NB #2 Therapeutic left L4-5 LESI #4  Diagnostic right lumbar facet block #2       Recent Visits Date Type Provider Dept  12/12/19 Procedure visit Milinda Pointer, MD [facility]  11/21/19 Procedure visit Milinda Pointer, MD [facility]  11/07/19 Procedure visit Milinda Pointer, MD [facility]  Showing recent visits within past 90 days and meeting all other requirements   Today's Visits Date Type Provider Dept  01/07/20 Procedure visit Milinda Pointer, MD [facility]  Showing today's visits and meeting all other requirements   Future Appointments Date Type Provider Dept  01/23/20 Appointment Milinda Pointer, MD [facility]  Showing future appointments within next 90 days and meeting all other requirements   Disposition: Discharge home  Discharge (Date  Time): 01/07/2020; 0850 hrs.   Primary Care Physician: Lauren Market, MD Location: [facility] Note by: Lauren Cola, MD  Date: 01/07/2020; Time: 11:40 AM  Disclaimer:  Medicine is not an Chief Strategy Officer. The only guarantee in medicine is that nothing is guaranteed. It is important to note that the decision to proceed with this intervention was based on the information collected from the patient. The Data and conclusions were drawn from the patient's questionnaire, the interview, and the physical examination. Because the information was provided in large part by the patient, it cannot be guaranteed that it has not been purposely or unconsciously manipulated. Every effort has been made to obtain as much relevant data as possible for this evaluation. It is important to note that the conclusions that lead to this procedure are derived in large part from the available data. Always take into account that the treatment will also be dependent on availability of resources and existing treatment guidelines, considered by other Pain Management Practitioners as being common knowledge and practice, at the time of the intervention. For Medico-Legal purposes, it is also important to point out that variation in procedural techniques and pharmacological choices are the acceptable norm. The indications, contraindications, technique, and results of the above procedure should only be interpreted and judged by a Board-Certified Interventional Pain Specialist with extensive familiarity and expertise in the same exact procedure and technique.

## 2020-01-07 NOTE — Progress Notes (Signed)
Safety precautions to be maintained throughout the outpatient stay will include: orient to surroundings, keep bed in low position, maintain call bell within reach at all times, provide assistance with transfer out of bed and ambulation.  

## 2020-01-07 NOTE — Patient Instructions (Addendum)

## 2020-01-08 ENCOUNTER — Telehealth: Payer: Self-pay

## 2020-01-08 NOTE — Telephone Encounter (Signed)
Post procedure phone call.  Patient states she is doing well.  

## 2020-01-23 ENCOUNTER — Ambulatory Visit: Payer: Medicaid Other | Admitting: Pain Medicine

## 2020-01-23 ENCOUNTER — Telehealth: Payer: Self-pay | Admitting: *Deleted

## 2020-01-23 NOTE — Patient Instructions (Signed)

## 2020-01-23 NOTE — Telephone Encounter (Signed)
Called patient back, she is concerned about her s/s and cancelled appt for this morning.  She unsure if she has been in contact with anyone who had covid and is not sure when she should or if she should be tested.  Told patient that she could wait and see how things play out, I think the recommendation for testing is around 8 days after exposure but told her that those numbers have changed often.  Told her that I do think [facility] does covid testing on Thursday afternoons or she could contact her primary.  She will call back on Monday and see how things go over the weekend before she reschedules.

## 2020-01-23 NOTE — Progress Notes (Signed)
Patient canceled due to sore throat.

## 2020-03-03 ENCOUNTER — Ambulatory Visit (HOSPITAL_BASED_OUTPATIENT_CLINIC_OR_DEPARTMENT_OTHER): Payer: Medicaid Other | Admitting: Pain Medicine

## 2020-03-03 ENCOUNTER — Ambulatory Visit
Admission: RE | Admit: 2020-03-03 | Discharge: 2020-03-03 | Disposition: A | Discharge status: Home / Self Care | Payer: Medicaid Other | Source: Ambulatory Visit | Attending: Pain Medicine | Admitting: Pain Medicine

## 2020-03-03 ENCOUNTER — Other Ambulatory Visit: Payer: Self-pay

## 2020-03-03 VITALS — BP 141/82 | HR 92 | Temp 97.8°F | Resp 20 | Ht 64.0 in | Wt 270.0 lb

## 2020-03-03 DIAGNOSIS — M170 Bilateral primary osteoarthritis of knee: Secondary | ICD-10-CM | POA: Insufficient documentation

## 2020-03-03 DIAGNOSIS — Z6841 Body Mass Index (BMI) 40.0 and over, adult: Secondary | ICD-10-CM | POA: Diagnosis present

## 2020-03-03 DIAGNOSIS — M25562 Pain in left knee: Secondary | ICD-10-CM | POA: Diagnosis not present

## 2020-03-03 DIAGNOSIS — G8929 Other chronic pain: Secondary | ICD-10-CM | POA: Insufficient documentation

## 2020-03-03 DIAGNOSIS — M25561 Pain in right knee: Secondary | ICD-10-CM | POA: Diagnosis present

## 2020-03-03 MED ORDER — ROPIVACAINE HCL 2 MG/ML IJ SOLN
5.0000 mL | Freq: Once | INTRAMUSCULAR | Status: AC
  Administered 2020-03-03: 5 mL via INTRA_ARTICULAR

## 2020-03-03 MED ORDER — LIDOCAINE HCL (PF) 1 % IJ SOLN
INTRAMUSCULAR | Status: AC
  Filled 2020-03-03: qty 5

## 2020-03-03 MED ORDER — LIDOCAINE HCL 2 % IJ SOLN
20.0000 mL | Freq: Once | INTRAMUSCULAR | Status: AC
  Administered 2020-03-03: 400 mg

## 2020-03-03 MED ORDER — LIDOCAINE HCL (PF) 1 % IJ SOLN
5.0000 mL | Freq: Once | INTRAMUSCULAR | Status: AC
  Administered 2020-03-03: 5 mL

## 2020-03-03 MED ORDER — SODIUM HYALURONATE (VISCOSUP) 20 MG/2ML IX SOSY
2.0000 mL | PREFILLED_SYRINGE | Freq: Once | INTRA_ARTICULAR | Status: AC
  Administered 2020-03-03: 2 mL via INTRA_ARTICULAR

## 2020-03-03 MED ORDER — LIDOCAINE HCL 2 % IJ SOLN
INTRAMUSCULAR | Status: AC
  Filled 2020-03-03: qty 20

## 2020-03-03 MED ORDER — ROPIVACAINE HCL 2 MG/ML IJ SOLN
INTRAMUSCULAR | Status: AC
  Filled 2020-03-03: qty 10

## 2020-03-03 NOTE — Progress Notes (Signed)
PROVIDER NOTE: Information contained herein reflects review and annotations entered in association with encounter. Interpretation of such information and data should be left to medically-trained personnel. Information provided to patient can be located elsewhere in the medical record under "Patient Instructions". Document created using STT-dictation technology, any transcriptional errors that may result from process are unintentional.    Patient: Lauren [name]  Service Category: Procedure  Provider: Gaspar Cola, MD  DOB: May 08, 1976  DOS: 03/03/2020  Location: [facility]  MRN: [id number]  Setting: Ambulatory - outpatient  Referring Provider: Lorelee Market, MD  Type: Established Patient  Specialty: Interventional Pain Management  PCP: Lorelee Market, MD   Primary Reason for Visit: Interventional Pain Management Treatment. CC: Knee Pain (bilateral)  Procedure:          Anesthesia, Analgesia, Anxiolysis:  Type: Therapeutic Intra-Articular Hyalgan Knee Injection #5  Region: Lateral infrapatellar Knee Region Level: Knee Joint Laterality: Bilateral  Type: Local Anesthesia Indication(s): Analgesia         Local Anesthetic: Lidocaine 1-2% Route: Infiltration ([place]/IM) IV Access: Declined Sedation: Declined   Position: Sitting   Indications: 1. Chronic knee pain (Primary Area of Pain) (Bilateral) (L>R)   2. Osteoarthritis of knees (Bilateral)   3. Morbid obesity with BMI of 50.0-59.9, adult (HCC)    Pain Score: Pre-procedure: 5 /10 Post-procedure: 0-No pain/10   Pre-op Assessment:  Lauren [name] is a 44 y.o. (year old), female patient, seen today for interventional treatment. She  has a past surgical history that includes Rotator cuff repair; Knee surgery; Cholecystectomy; Cesarean section (2010); Cesarean section (2011); Tonsillectomy; Cardiac electrophysiology study and ablation (01/18/2018); and Excision mass neck (Left, 10/11/2018). Lauren [name] has a  current medication list which includes the following prescription(s): albuterol, aspirin ec, beclomethasone, diltiazem, diltiazem, esomeprazole, ferrous sulfate, furosemide, hydrocodone-acetaminophen, ibuprofen, losartan-hydrochlorothiazide, medroxyprogesterone acetate, metoprolol succinate, fish oil, pregabalin, stiolto respimat, fluticasone, and hydrocodone-acetaminophen. Her primarily concern today is the Knee Pain (bilateral)  Initial Vital Signs:  Pulse/HCG Rate: 92ECG Heart Rate: 92 Temp: 97.8 F (36.6 C) Resp: 16 BP: (!) 154/86 SpO2: 99 %  BMI: Estimated body mass index is 46.35 kg/m as calculated from the following:   Height as of this encounter: 5\' 4"  (1.626 m).   Weight as of this encounter: 270 lb (122.5 kg).  Risk Assessment: Allergies: Reviewed. She is allergic to nucynta [tapentadol] and tape.  Allergy Precautions: None required Coagulopathies: Reviewed. None identified.  Blood-thinner therapy: None at this time Active Infection(s): Reviewed. None identified. Lauren [name] is afebrile  Site Confirmation: Lauren [name] was asked to confirm the procedure and laterality before marking the site Procedure checklist: Completed Consent: Before the procedure and under the influence of no sedative(s), amnesic(s), or anxiolytics, the patient was informed of the treatment options, risks and possible complications. To fulfill our ethical and legal obligations, as recommended by the American Medical Association's Code of Ethics, I have informed the patient of my clinical impression; the nature and purpose of the treatment or procedure; the risks, benefits, and possible complications of the intervention; the alternatives, including doing nothing; the risk(s) and benefit(s) of the alternative treatment(s) or procedure(s); and the risk(s) and benefit(s) of doing nothing. The patient was provided information about the general risks and possible complications associated with the procedure. These  may include, but are not limited to: failure to achieve desired goals, infection, bleeding, organ or nerve damage, allergic reactions, paralysis, and death. In addition, the patient was informed of those risks and complications associated to the procedure,  such as failure to decrease pain; infection; bleeding; organ or nerve damage with subsequent damage to sensory, motor, and/or autonomic systems, resulting in permanent pain, numbness, and/or weakness of one or several areas of the body; allergic reactions; (i.e.: anaphylactic reaction); and/or death. Furthermore, the patient was informed of those risks and complications associated with the medications. These include, but are not limited to: allergic reactions (i.e.: anaphylactic or anaphylactoid reaction(s)); adrenal axis suppression; blood sugar elevation that in diabetics may result in ketoacidosis or comma; water retention that in patients with history of congestive heart failure may result in shortness of breath, pulmonary edema, and decompensation with resultant heart failure; weight gain; swelling or edema; medication-induced neural toxicity; particulate matter embolism and blood vessel occlusion with resultant organ, and/or nervous system infarction; and/or aseptic necrosis of one or more joints. Finally, the patient was informed that Medicine is not an exact science; therefore, there is also the possibility of unforeseen or unpredictable risks and/or possible complications that may result in a catastrophic outcome. The patient indicated having understood very clearly. We have given the patient no guarantees and we have made no promises. Enough time was given to the patient to ask questions, all of which were answered to the patient's satisfaction. Lauren [name] has indicated that she wanted to continue with the procedure. Attestation: I, the ordering provider, attest that I have discussed with the patient the benefits, risks, side-effects,  alternatives, likelihood of achieving goals, and potential problems during recovery for the procedure that I have provided informed consent. Date  Time: 03/03/2020  8:04 AM  Pre-Procedure Preparation:  Monitoring: As per clinic protocol. Respiration, ETCO2, SpO2, BP, heart rate and rhythm monitor placed and checked for adequate function Safety Precautions: Patient was assessed for positional comfort and pressure points before starting the procedure. Time-out: I initiated and conducted the "Time-out" before starting the procedure, as per protocol. The patient was asked to participate by confirming the accuracy of the "Time Out" information. Verification of the correct person, site, and procedure were performed and confirmed by me, the nursing staff, and the patient. "Time-out" conducted as per Joint Commission's Universal Protocol (UP.01.01.01). Time: 0833  Description of Procedure:          Target Area: Knee Joint Approach: Just above the Lateral tibial plateau, lateral to the infrapatellar tendon. Area Prepped: Entire knee area, from the mid-thigh to the mid-shin. DuraPrep (Iodine Povacrylex [0.7% available iodine] and Isopropyl Alcohol, 74% w/w) Safety Precautions: Aspiration looking for blood return was conducted prior to all injections. At no point did we inject any substances, as a needle was being advanced. No attempts were made at seeking any paresthesias. Safe injection practices and needle disposal techniques used. Medications properly checked for expiration dates. SDV (single dose vial) medications used. Description of the Procedure: Protocol guidelines were followed. The patient was placed in position over the fluoroscopy table. The target area was identified and the area prepped in the usual manner. Skin & deeper tissues infiltrated with local anesthetic. Appropriate amount of time allowed to pass for local anesthetics to take effect. The procedure needles were then advanced to the target  area. Proper needle placement secured. Negative aspiration confirmed. Solution injected in intermittent fashion, asking for systemic symptoms every 0.5cc of injectate. The needles were then removed and the area cleansed, making sure to leave some of the prepping solution back to take advantage of its long term bactericidal properties. Vitals:   03/03/20 2440 03/03/20 1027 03/03/20 0838 03/03/20 0841  BP: 135/80 Marland Kitchen)  131/51 128/71 (!) 141/82  Pulse:      Resp: (!) 24 18 16 20   Temp:      SpO2: 98% 97% 99% 100%  Weight:      Height:        Start Time: 0833 hrs. End Time: 0841 hrs. Materials:  Needle(s) Type: Regular needle Gauge: 22G Length: 3.5-in Medication(s): Please see orders for medications and dosing details.  Imaging Guidance:          Type of Imaging Technique: Fluoroscopy Guidance (Non-spinal) Indication(s): Morbid obesity. Assistance in needle guidance and placement for procedures requiring needle placement in or near specific anatomical locations impossible to access without such assistance. Exposure Time: Please see nurses notes. Contrast: None used. Fluoroscopic Guidance: I was personally present during the use of fluoroscopy. "Tunnel Vision Technique" used to obtain the best possible view of the target area. Parallax error corrected before commencing the procedure. "Direction-depth-direction" technique used to introduce the needle under continuous pulsed fluoroscopy. Once target was reached, antero-posterior, oblique, and lateral fluoroscopic projection used confirm needle placement in all planes. Images permanently stored in EMR. Ultrasound Guidance: N/A Interpretation: No contrast injected. I personally interpreted the imaging intraoperatively. Adequate needle placement confirmed in multiple planes. Permanent images saved into the patient's record.  Antibiotic Prophylaxis:   Anti-infectives (From admission, onward)   None     Indication(s): None  identified  Post-operative Assessment:  Post-procedure Vital Signs:  Pulse/HCG Rate: 9286 Temp: 97.8 F (36.6 C) Resp: 20 BP: (!) 141/82 SpO2: 100 %  EBL: None  Complications: No immediate post-treatment complications observed by team, or reported by patient.  Note: The patient tolerated the entire procedure well. A repeat set of vitals were taken after the procedure and the patient was kept under observation following institutional policy, for this type of procedure. Post-procedural neurological assessment was performed, showing return to baseline, prior to discharge. The patient was provided with post-procedure discharge instructions, including a section on how to identify potential problems. Should any problems arise concerning this procedure, the patient was given instructions to immediately contact us, at any time, without hesitation. In any case, we plan to contact the patient by telephone for a follow-up status report regarding this interventional procedure.  Comments:  No additional relevant information.  Plan of Care  Orders:  Orders Placed This Encounter  Procedures  . KNEE INJECTION    Hyalgan knee injection to be done by MD.    Scheduling Instructions:     Procedure: Intra-articular Hyalgan Knee injection #5     Side(s): Bilateral Knee     Sedation: None     Timeframe: Today    Order Specific Question:   Where will this procedure be performed?    Answer:   [facility]  . [facility] C-ARM 1-60 MIN NO REPORT    Intraoperative interpretation by procedural physician at [place].    Standing Status:   Standing    Number of Occurrences:   1    Order Specific Question:   Reason for exam:    Answer:   Assistance in needle guidance and placement for procedures requiring needle placement in or near specific anatomical locations not easily accessible without such assistance.  . Informed Consent Details: Physician/Practitioner Attestation; Transcribe  to consent form and obtain patient signature    Provider Attestation: I, [place] Dossie Arbour, MD, (Pain Management Specialist), the physician/practitioner, attest that I have discussed with the patient the benefits, risks, side effects, alternatives, likelihood of achieving  goals and potential problems during recovery for the procedure that I have provided informed consent.    Scheduling Instructions:     Procedure: Therapeutic, bilateral, intra-articular Hyalgan knee injection     Indications: Chronic bilateral knee pain secondary to primary osteoarthritis of the knee     Note: Always confirm laterality of pain with Ms. Madewell, before procedure.     Transcribe to consent form and obtain patient signature.  . Provide equipment / supplies at bedside    Equipment required: Single use, disposable, "Block Tray"    Standing Status:   Standing    Number of Occurrences:   1    Order Specific Question:   Specify    Answer:   Block Tray   Chronic Opioid Analgesic:  Hydrocodone/APAP 5/325, 1 tab PO q 8 hrs (15 mg/day of hydrocodone) MME/day: 15 mg/day.   Medications ordered for procedure: Meds ordered this encounter  Medications  . lidocaine (XYLOCAINE) 2 % (with pres) injection 400 mg  . lidocaine (PF) (XYLOCAINE) 1 % injection 5 mL  . ropivacaine (PF) 2 mg/mL (0.2%) (NAROPIN) injection 5 mL  . Sodium Hyaluronate SOSY 2 mL  . Sodium Hyaluronate SOSY 2 mL   Medications administered: We administered lidocaine, lidocaine (PF), ropivacaine (PF) 2 mg/mL (0.2%), Sodium Hyaluronate, and Sodium Hyaluronate.  See the medical record for exact dosing, route, and time of administration.  Follow-up plan:   Return in about 2 weeks (around 03/17/2020) for (VV), (PP).       Considering: NOTE:NO Lumbar RFAuntil BMIis at or below 35. Diagnostic bilateral cervical facet NB Diagnostic bilateral cervical ESI Possible bilateral cervical facet RFA Diagnostic bilateral suprascapular NB Possible  bilateral suprascapular nerve RFA Diagnostic bilateral LESI Possible bilateral lumbar facet RFA(NO Lumbar RFAuntil BMIis at or below 35.)   Palliative PRN treatment(s): Palliative bilateral IA Hyalgan knee injection S1N3(Fluoro required)(1st - 22mo)(2nd - 3.9 mo) Palliative bilateral IA knee joint (steroid) #2  Diagnostic/therapeutic bilateral knee genicular NB #2 Diagnostic/therapeutic left IA wrist injection (MNB/TPI) #2 Diagnostic bilateral IA shoulder injection #3 Diagnostic left suprascapular NB #2 Therapeutic left L4-5 LESI #4  Diagnostic right lumbar facet block #2    Recent Visits Date Type Provider Dept  01/07/20 Procedure visit Milinda Pointer, MD [facility]  12/12/19 Procedure visit Milinda Pointer, MD [facility]  Showing recent visits within past 90 days and meeting all other requirements Today's Visits Date Type Provider Dept  03/03/20 Procedure visit Milinda Pointer, MD [facility]  Showing today's visits and meeting all other requirements Future Appointments Date Type Provider Dept  03/18/20 Appointment Milinda Pointer, MD [facility]  Showing future appointments within next 90 days and meeting all other requirements  Disposition: Discharge home  Discharge (Date  Time): 03/03/2020; 0854 hrs.   Primary Care Physician: Lorelee Market, MD Location: [facility] Note by: Gaspar Cola, MD Date: 03/03/2020; Time: 11:41 AM  Disclaimer:  Medicine is not an Chief Strategy Officer. The only guarantee in medicine is that nothing is guaranteed. It is important to note that the decision to proceed with this intervention was based on the information collected from the patient. The Data and conclusions were drawn from the patient's questionnaire, the interview, and the physical examination. Because the information was provided in large part by the patient, it cannot be guaranteed that  it has not been purposely or unconsciously manipulated. Every effort has been made to obtain as much relevant data as possible for this evaluation. It is  important to note that the conclusions that lead to this procedure are derived in large part from the available data. Always take into account that the treatment will also be dependent on availability of resources and existing treatment guidelines, considered by other Pain Management Practitioners as being common knowledge and practice, at the time of the intervention. For Medico-Legal purposes, it is also important to point out that variation in procedural techniques and pharmacological choices are the acceptable norm. The indications, contraindications, technique, and results of the above procedure should only be interpreted and judged by a Board-Certified Interventional Pain Specialist with extensive familiarity and expertise in the same exact procedure and technique.

## 2020-03-03 NOTE — Patient Instructions (Signed)

## 2020-03-04 ENCOUNTER — Telehealth: Payer: Self-pay

## 2020-03-04 NOTE — Telephone Encounter (Signed)
PoSt procedure phone call.  Mailbox is full.  Could not leave message.

## 2020-03-10 ENCOUNTER — Encounter: Payer: Self-pay | Admitting: Pain Medicine

## 2020-03-10 ENCOUNTER — Telehealth: Payer: Self-pay | Admitting: *Deleted

## 2020-03-10 ENCOUNTER — Ambulatory Visit: Payer: Medicaid Other | Attending: Pain Medicine | Admitting: Pain Medicine

## 2020-03-10 ENCOUNTER — Other Ambulatory Visit: Payer: Self-pay

## 2020-03-10 VITALS — BP 148/113 | HR 101 | Temp 97.5°F | Resp 16 | Ht 64.0 in | Wt 270.0 lb

## 2020-03-10 DIAGNOSIS — M545 Low back pain: Secondary | ICD-10-CM

## 2020-03-10 DIAGNOSIS — G894 Chronic pain syndrome: Secondary | ICD-10-CM | POA: Diagnosis not present

## 2020-03-10 DIAGNOSIS — Z79899 Other long term (current) drug therapy: Secondary | ICD-10-CM

## 2020-03-10 DIAGNOSIS — M47816 Spondylosis without myelopathy or radiculopathy, lumbar region: Secondary | ICD-10-CM | POA: Diagnosis present

## 2020-03-10 DIAGNOSIS — G8929 Other chronic pain: Secondary | ICD-10-CM

## 2020-03-10 DIAGNOSIS — M8949 Other hypertrophic osteoarthropathy, multiple sites: Secondary | ICD-10-CM

## 2020-03-10 DIAGNOSIS — M159 Polyosteoarthritis, unspecified: Secondary | ICD-10-CM

## 2020-03-10 DIAGNOSIS — M25562 Pain in left knee: Secondary | ICD-10-CM | POA: Diagnosis present

## 2020-03-10 MED ORDER — MELOXICAM 15 MG PO TABS: 15.0000 mg | ORAL_TABLET | Freq: Every day | ORAL | 2 refills | Status: DC

## 2020-03-10 MED ORDER — HYDROCODONE-ACETAMINOPHEN 5-325 MG PO TABS: 1.0000 | ORAL_TABLET | Freq: Three times a day (TID) | ORAL | 0 refills | Status: DC | PRN

## 2020-03-10 MED ORDER — TURMERIC 500 MG PO CAPS: 500.0000 mg | ORAL_CAPSULE | Freq: Every day | ORAL | 0 refills | Status: DC

## 2020-03-10 NOTE — Progress Notes (Signed)
Safety precautions to be maintained throughout the outpatient stay will include: orient to surroundings, keep bed in low position, maintain call bell within reach at all times, provide assistance with transfer out of bed and ambulation.   Patient did not bring medication today. No pills remaining. Instructed to bring next appointment

## 2020-03-10 NOTE — Telephone Encounter (Signed)
Pharmacy called, concerned that patient has prescriptions for 3 NSAIDS, Ibuprofen, Meloxicam, and Celebrex. Dr. Dossie Arbour spoke with her about the Ibuprofen at her appt today, instructed her to not take the Ibuprofen. I spoke with Dr. Dossie Arbour about the Celebrex from Dr. Bernita Buffy, he instructed to call her and tell her not to take the Celebrex. I did this, patient stated that she understood.

## 2020-03-10 NOTE — Progress Notes (Signed)
PROVIDER NOTE: Information contained herein reflects review and annotations entered in association with encounter. Interpretation of such information and data should be left to medically-trained personnel. Information provided to patient can be located elsewhere in the medical record under "Patient Instructions". Document created using STT-dictation technology, any transcriptional errors that may result from process are unintentional.    Patient: Lauren [name]  Service Category: E/M  Provider: Gaspar Cola, MD  DOB: 1976-06-07  DOS: 03/10/2020  Specialty: Interventional Pain Management  MRN: [id number]  Setting: Ambulatory outpatient  PCP: Lorelee Market, MD  Type: Established Patient    Referring Provider: Lorelee Market, MD  Location: Office  Delivery: Face-to-face     HPI  Reason for encounter: [name], a 44 y.o. year old female, is here today for evaluation and management of her Chronic pain syndrome [G89.4]. [name]'s primary complain today is Back Pain (lower), Knee Pain (bilateral), and Neck Pain Last encounter: Practice (03/04/2020). My last encounter with her was on 03/03/2020. Pertinent problems: [name] has Chronic knee pain (1ry area of Pain) (Bilateral) (L>R); Chronic daily headache; Chronic pain in right shoulder; Chronic generalized pain; Fibromyalgia; Chronic wrist pain (2ry area of Pain) (Left); Chronic shoulder pain (4th area of Pain) (Bilateral) (L>R); 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 (Bilateral); Chronic upper extremity pain (Left); Chronic neck pain (3ry area of Pain) (Bilateral) (L>R); Chronic lower extremity pain (Bilateral); Chronic sacroiliac joint pain; Chronic pain syndrome; Chronic musculoskeletal pain; Osteoarthritis of knees (Bilateral); Cervicalgia; Chronic low back pain (5th area of Pain) (Bilateral) (R>L) w/o sciatica; DDD (degenerative disc disease), lumbar; Neurogenic pain; Impaired ambulation; Lumbar spondylosis;  Chronic knee pain (Left); Lipoma of shoulder (Left); Osteoarthritis of shoulder (Bilateral); Chronic forearm pain (Left); Chronic shoulder pain (Left); Lipoma of neck (Left); Rotator cuff tendinitis, left; Rotator cuff tendinitis, right; Osteoarthritis involving multiple joints; Lumbar facet syndrome (Bilateral) (R>L); Spondylosis without myelopathy or radiculopathy, lumbosacral region; Osteoarthritis of shoulder (Left); and Gastrocnemius muscle tear, left, initial encounter on their pertinent problem list. Pain Assessment: Severity of Chronic pain is reported as a 5 /10. Location: Back Lower/down back of legs to bottom of feet. Onset: More than a month ago. Quality: Aching, Burning, Constant, Restless, Sore, Discomfort, Numbness. Timing: Constant. Modifying factor(s): medicaion heat massage. Vitals:  height is '5\' 4"'$  (1.626 m) and weight is 270 lb (122.5 kg). Her temperature is 97.5 F (36.4 C) (abnormal). Her blood pressure is 148/113 (abnormal) and her pulse is 101 (abnormal). Her respiration is 16 and oxygen saturation is 99%.    The patient indicates doing well with the current medication regimen. No adverse reactions or side effects reported to the medications.  Today we talked about her ibuprofen and the fact that I would prefer if she can change it for something else since he can be rather hard on her kidneys and it will also contribute to increasing her blood pressure.  For this reason, today we will be discontinuing the ibuprofen and will do a trial of Mobic 15 mg p.o. daily in a.m. I have also added some turmeric to the regimen to see if that helps her with some of that arthritic pain.  Today she complains about bilateral low back pain secondary to the fact that she has been having to use a cane to move around.  Although the Hyalgan knee injections did help her with the right side, on the left side it only provided her with 35% relief of the pain.  This has to do  with the fact that the last MRI that  we did around February 2020 showed almost complete chondral loss on the lateral compartment, which is exactly where she is having her pain.  The last time that we treated her lumbar facet syndrome was in October of last year meaning that the block provided her with 8 months of pain relief.  We will go ahead and repeat these and we may consider doing a more targeted diagnostic block of her left knee to see if we could control some of this pain with radiofrequency ablation.  To control that area we would need to do a block of the superior lateral genicular nerve as well as the inferior lateral genicular nerve around the area of the lateral tibial plateau.  Post-Procedure Evaluation  Procedure: Therapeutic bilateral intra-articular Hyalgan knee injection #5 under fluoroscopic guidance, no sedation Pre-procedure pain level: 5/10 Post-procedure: 0/10 (100% relief)  Sedation: None.  Effectiveness during initial hour after procedure(Ultra-Short Term Relief): 100 %.  Local anesthetic used: Long-acting (4-6 hours) Effectiveness: Defined as any analgesic benefit obtained secondary to the administration of local anesthetics. This carries significant diagnostic value as to the etiological location, or anatomical origin, of the pain. Duration of benefit is expected to coincide with the duration of the local anesthetic used.  Effectiveness during initial 4-6 hours after procedure(Short-Term Relief): 100 %.  Long-term benefit: Defined as any relief past the pharmacologic duration of the local anesthetics.  Effectiveness past the initial 6 hours after procedure(Long-Term Relief): 80 % (right 80 better, left 35% better).  Current benefits: Defined as benefit that persist at this time.   Analgesia:  80% improved on the right and 35% improvement on the left. Function: [name] reports improvement in function ROM: [name] reports improvement in ROM  Pharmacotherapy Assessment   Analgesic:  Hydrocodone/APAP 5/325, 1 tab PO q 8 hrs (15 mg/day of hydrocodone) MME/day: 15 mg/day.   Monitoring: [place] PMP: PDMP reviewed during this encounter.       Pharmacotherapy: No side-effects or adverse reactions reported. Compliance: No problems identified. Effectiveness: Clinically acceptable.  Ignatius Specking, RN  03/10/2020  2:12 PM  Sign when Signing Visit Safety precautions to be maintained throughout the outpatient stay will include: orient to surroundings, keep bed in low position, maintain call bell within reach at all times, provide assistance with transfer out of bed and ambulation.   Patient did not bring medication today. No pills remaining. Instructed to bring next appointment    UDS:  Summary  Date Value Ref Range Status  06/27/2019 Note  Final    Comment:    ==================================================================== [employer] Select 13 (MW) ==================================================================== Test                             Result       Flag       Units Drug Present and Declared for Prescription Verification   Hydrocodone                    707          EXPECTED   ng/mg creat   Hydromorphone                  70           EXPECTED   ng/mg creat   Dihydrocodeine                 88  EXPECTED   ng/mg creat   Norhydrocodone                 963          EXPECTED   ng/mg creat    Sources of hydrocodone include scheduled prescription medications.    Hydromorphone, dihydrocodeine and norhydrocodone are expected    metabolites of hydrocodone. Hydromorphone and dihydrocodeine are    also available as scheduled prescription medications. Drug Present not Declared for Prescription Verification   Alpha-hydroxymidazolam         88           UNEXPECTED ng/mg creat    Alpha-hydroxymidazolam is an expected metabolite of midazolam.    Source of midazolam is a scheduled prescription  medication. ==================================================================== Test                      Result    Flag   Units      Ref Range   Creatinine              268              mg/dL      >=20 ==================================================================== Declared Medications:  The flagging and interpretation on this report are based on the  following declared medications.  Unexpected results may arise from  inaccuracies in the declared medications.  **Note: The testing scope of this panel includes these medications:  Hydrocodone  **Note: The testing scope of this panel does not include the  following reported medications:  Acetaminophen  Albuterol  Aspirin  Beclomethasone  Diltiazem  Esomeprazole  Fluticasone  Furosemide  Hydrochlorothiazide  Ibuprofen  Losartan  Medroxyprogesterone  Metoprolol  Olodaterol  Pregabalin  Tiotropium ==================================================================== For clinical consultation, please call [phone]. ====================================================================      ROS  Constitutional: Denies any fever or chills Gastrointestinal: No reported hemesis, hematochezia, vomiting, or acute GI distress Musculoskeletal: Denies any acute onset joint swelling, redness, loss of ROM, or weakness Neurological: No reported episodes of acute onset apraxia, aphasia, dysarthria, agnosia, amnesia, paralysis, loss of coordination, or loss of consciousness  Medication Review  Fish Oil, HYDROcodone-acetaminophen, Tiotropium Bromide-Olodaterol, Turmeric, albuterol, aspirin EC, beclomethasone, diltiazem, esomeprazole, ferrous sulfate, fluticasone, furosemide, losartan-hydrochlorothiazide, medroxyPROGESTERone Acetate, meloxicam, metoprolol succinate, and pregabalin  History Review  Allergy: [name] is allergic to nucynta [tapentadol] and tape. Drug: [name]  reports no history of drug use. Alcohol:   reports no history of alcohol use. Tobacco:  reports that she has never smoked. She has never used smokeless tobacco. Social: [name]  reports that she has never smoked. She has never used smokeless tobacco. She reports that she does not drink alcohol and does not use drugs. Medical:  has a past medical history of Acute shoulder pain (Left) (12/26/2018), Advanced maternal age in multigravida, Asthma, Depression, GERD (gastroesophageal reflux disease), Grand multiparity, Headache, History of carpal tunnel surgery of left wrist (05/2018), Hypertension, Leg fracture, Obesity, morbid, BMI 40.0-49.9 ([place]), Post traumatic stress disorder, Pre-diabetes, Pre-eclampsia, SVT (supraventricular tachycardia) ([place]), and Wrist fracture, closed. Surgical: [name]  has a past surgical history that includes Rotator cuff repair; Knee surgery; Cholecystectomy; Cesarean section (2010); Cesarean section (2011); Tonsillectomy; Cardiac electrophysiology study and ablation (01/18/2018); and Excision mass neck (Left, 10/11/2018). Family: family history includes Coronary artery disease in her brother; Diabetes in her brother, father, mother, sister, sister, sister, sister, sister, and sister; Hypertension in her brother and brother; Migraines in her father, maternal aunt, maternal uncle, paternal aunt,  and paternal uncle; Ovarian cancer in her cousin; Stomach cancer in her sister; Throat cancer in her maternal aunt.  Laboratory Chemistry Profile   Renal Lab Results  Component Value Date   BUN 14 06/27/2019   CREATININE 0.65 06/27/2019   LABCREA 48 62/83/1517   BCR NOT APPLICABLE 61/60/7371   GFRAA >60 06/27/2019   GFRNONAA >60 06/27/2019     Hepatic Lab Results  Component Value Date   AST 14 (L) 06/27/2019   ALT 12 06/27/2019   ALBUMIN 3.9 06/27/2019   ALKPHOS 51 06/27/2019   LIPASE 27 09/05/2017     Electrolytes Lab Results  Component Value Date   NA 140 06/27/2019   K 3.8 06/27/2019   CL 103  06/27/2019   CALCIUM 9.0 06/27/2019   MG 1.8 06/27/2019     Bone Lab Results  Component Value Date   VD25OH 23.11 (L) 06/27/2019   25OHVITD1 32 03/14/2018   25OHVITD2 <1.0 03/14/2018   25OHVITD3 32 03/14/2018     Inflammation (CRP: Acute Phase) (ESR: Chronic Phase) Lab Results  Component Value Date   CRP 2.7 (H) 06/27/2019   ESRSEDRATE 39 (H) 06/27/2019       Note: Above Lab results reviewed.  Recent Imaging Review  DG PAIN CLINIC C-ARM 1-60 MIN NO REPORT Fluoro was used, but no Radiologist interpretation will be provided.  Please refer to "NOTES" tab for provider progress note. Note: Reviewed        Physical Exam  General appearance: Well nourished, well developed, and well hydrated. In no apparent acute distress Mental status: Alert, oriented x 3 (person, place, & time)       Respiratory: No evidence of acute respiratory distress Eyes: PERLA Vitals: BP (!) 148/113 Comment: states she is hurting a lot taken right forearm   Pulse (!) 101    Temp (!) 97.5 F (36.4 C)    Resp 16    Ht '5\' 4"'$  (1.626 m)    Wt 270 lb (122.5 kg)    SpO2 99%    BMI 46.35 kg/m  BMI: Estimated body mass index is 46.35 kg/m as calculated from the following:   Height as of this encounter: '5\' 4"'$  (1.626 m).   Weight as of this encounter: 270 lb (122.5 kg). Ideal: Ideal body weight: 54.7 kg (120 lb 9.5 oz) Adjusted ideal body weight: 81.8 kg (180 lb 5.7 oz)  Assessment   Status Diagnosis  Controlled Controlled Controlled 1. Chronic pain syndrome   2. Chronic low back pain (5th area of Pain) (Bilateral) (R>L) w/o sciatica   3. Lumbar facet syndrome (Bilateral) (R>L)   4. Chronic knee pain (Left)   5. Osteoarthritis involving multiple joints   6. Pharmacologic therapy      Updated Problems: Problem  Chronic knee pain (Left)   Persistent after treatment with intra-articular steroid injections and a series of 5 intra-articular Hyalgan knee injections. Abnormal left knee MRI (September 30, 2013)  ([place]): 1.  Interval deficit within the central lateral meniscus, which may be postsurgical or reflect interval radial tear.  Moderate chondrosis of the weightbearing articular cartilage of the lateral compartment, with tibial plateau marrow edema sub-adjacent to the area of the meniscal deficit. 2.  Persistent suggestion of lateral subluxation of the patella, with interval exacerbation of patellofemoral chondrosis, most pronounced at the medial ridge. 3.  Edema within the superolateral lateral portion of Hoffa's fat pad, which may reflect impingement. 4.  Edema within the subcutaneous soft tissue adjacent to the patellar  insertion on the tibia may reflect bursitis.   Chronic low back pain (5th area of Pain) (Bilateral) (R>L) w/o sciatica  Chronic wrist pain (2ry area of Pain) (Left)  Chronic shoulder pain (4th area of Pain) (Bilateral) (L>R)  Chronic neck pain (3ry area of Pain) (Bilateral) (L>R)  Chronic knee pain (1ry area of Pain) (Bilateral) (L>R)    Plan of Care  Problem-specific:  No problem-specific Assessment & Plan notes found for this encounter.  [name] has a current medication list which includes the following long-term medication(s): albuterol, diltiazem, diltiazem, esomeprazole, ferrous sulfate, fluticasone, hydrocodone-acetaminophen, [START ON 04/09/2020] hydrocodone-acetaminophen, [START ON 05/09/2020] hydrocodone-acetaminophen, losartan-hydrochlorothiazide, metoprolol succinate, and pregabalin.  Pharmacotherapy (Medications Ordered): Meds ordered this encounter  Medications   HYDROcodone-acetaminophen (NORCO/VICODIN) 5-325 MG tablet    Sig: Take 1 tablet by mouth every 8 (eight) hours as needed for severe pain. Must last 30 days    Dispense:  90 tablet    Refill:  0    Chronic Pain: STOP Act (Not applicable) Fill 1 day early if closed on refill date. Do not fill until: 03/10/2020. To last until: 04/09/2020. Avoid benzodiazepines within 8  hours of opioids   HYDROcodone-acetaminophen (NORCO/VICODIN) 5-325 MG tablet    Sig: Take 1 tablet by mouth every 8 (eight) hours as needed for severe pain. Must last 30 days    Dispense:  90 tablet    Refill:  0    Chronic Pain: STOP Act (Not applicable) Fill 1 day early if closed on refill date. Do not fill until: 04/09/2020. To last until: 05/09/2020. Avoid benzodiazepines within 8 hours of opioids   HYDROcodone-acetaminophen (NORCO/VICODIN) 5-325 MG tablet    Sig: Take 1 tablet by mouth every 8 (eight) hours as needed for severe pain. Must last 30 days    Dispense:  90 tablet    Refill:  0    Chronic Pain: STOP Act (Not applicable) Fill 1 day early if closed on refill date. Do not fill until: 05/09/2020. To last until: 06/08/2020. Avoid benzodiazepines within 8 hours of opioids   meloxicam (MOBIC) 15 MG tablet    Sig: Take 1 tablet (15 mg total) by mouth daily.    Dispense:  30 tablet    Refill:  2    Do not add this medication to the electronic "Automatic Refill" notification system. Patient may have prescription filled one day early if pharmacy is closed on scheduled refill date.   Turmeric 500 MG CAPS    Sig: Take 500 mg by mouth daily.    Dispense:  90 capsule    Refill:  0    OTC Recommendation. Please provide information on: where to find; how to take; side-effects; adverse reactions; drug-to-drug interactions; and contraindications. If unavailable, recommend similar substitute.   Orders:  Orders Placed This Encounter  Procedures   LUMBAR FACET(MEDIAL BRANCH NERVE BLOCK) MBNB    Standing Status:   Future    Standing Expiration Date:   04/09/2020    Scheduling Instructions:     Procedure: Lumbar facet block (AKA.: Lumbosacral medial branch nerve block)     Side: Bilateral     Level: L3-4, L4-5, & L5-S1 Facets (L2, L3, L4, L5, & S1 Medial Branch Nerves)     Sedation: With Sedation.     Timeframe: ASAA    Order Specific Question:   Where will this procedure be performed?     Answer:   [facility]   GENICULAR NERVE BLOCK    Indication(s):  Sub-acute knee pain    Standing Status:   Future    Standing Expiration Date:   06/10/2020    Scheduling Instructions:     Procedure: Left lateral superior and inferior genicular nerve block     Side: Left-sided      Sedation: With Sedation.     Timeframe: As soon as schedule allows    Order Specific Question:   Where will this procedure be performed?    Answer:   [facility]   Tox[employer]SU[employer] Select 13 (MW), Urine    Volume: 30 ml(s). Minimum 3 ml of urine is needed. Document temperature of fresh sample. Indications: Long term (current) use of opiate analgesic (X48.016)    Order Specific Question:   Release to patient    Answer:   Immediate   Follow-up plan:   Return for Procedure (w/ sedation): (B) L-FCT BLK + (L) Lateral Genicular NB.      Considering: NOTE:NO Lumbar RFAuntil BMIis at or below 35. Diagnostic bilateral cervical facet NB Diagnostic bilateral cervical ESI Possible bilateral cervical facet RFA Diagnostic bilateral suprascapular NB Possible bilateral suprascapular nerve RFA Diagnostic bilateral LESI Possible bilateral lumbar facet RFA(NO Lumbar RFAuntil BMIis at or below 35.)   Palliative PRN treatment(s): Palliative bilateral IA Hyalgan knee injection S1N3(Fluoro required)(1st - 18mo(2nd - 3.9 mo) Palliative bilateral IA knee joint (steroid) #2  Diagnostic/therapeutic bilateral knee genicular NB #2 Diagnostic/therapeutic left IA wrist injection (MNB/TPI) #2 Diagnostic bilateral IA shoulder injection #3 Diagnostic left suprascapular NB #2 Therapeutic left L4-5 LESI #4  Diagnostic right lumbar facet block #2     Recent Visits Date Type Provider Dept  03/03/20 Procedure visit NMilinda Pointer MD [facility]  01/07/20 Procedure visit NMilinda Pointer MD [facility]  12/12/19 Procedure visit NMilinda Pointer MD [facility]   Showing recent visits within past 90 days and meeting all other requirements Today's Visits Date Type Provider Dept  03/10/20 Office Visit NMilinda Pointer MD [facility]  Showing today's visits and meeting all other requirements Future Appointments Date Type Provider Dept  03/24/20 Appointment NMilinda Pointer MD [facility]  Showing future appointments within next 90 days and meeting all other requirements  I discussed the assessment and treatment plan with the patient. The patient was provided an opportunity to ask questions and all were answered. The patient agreed with the plan and demonstrated an understanding of the instructions.  Patient advised to call back or seek an in-person evaluation if the symptoms or condition worsens.  Duration of encounter: 30 minutes.  Note by: FGaspar Cola MD Date: 03/10/2020; Time: 2:55 PM

## 2020-03-10 NOTE — Patient Instructions (Addendum)
____________________________________________________________________________________________  Drug Holidays (Slow)  What is a "Drug Holiday"? Drug Holiday: is the name given to the period of time during which a patient stops taking a medication(s) for the purpose of eliminating tolerance to the drug.  Benefits . Improved effectiveness of opioids. . Decreased opioid dose needed to achieve benefits. . Improved pain with lesser dose.  What is tolerance? Tolerance: is the progressive decreased in effectiveness of a drug due to its repetitive use. With repetitive use, the body gets use to the medication and as a consequence, it loses its effectiveness. This is a common problem seen with opioid pain medications. As a result, a larger dose of the drug is needed to achieve the same effect that used to be obtained with a smaller dose.  How long should a "Drug Holiday" last? You should stay off of the pain medicine for at least 14 consecutive days. (2 weeks)  Should I stop the medicine "cold turkey"? No. You should always coordinate with your Pain Specialist so that he/she can provide you with the correct medication dose to make the transition as smoothly as possible.  How do I stop the medicine? Slowly. You will be instructed to decrease the daily amount of pills that you take by one (1) pill every seven (7) days. This is called a "slow downward taper" of your dose. For example: if you normally take four (4) pills per day, you will be asked to drop this dose to three (3) pills per day for seven (7) days, then to two (2) pills per day for seven (7) days, then to one (1) per day for seven (7) days, and at the end of those last seven (7) days, this is when the "Drug Holiday" would start.   Will I have withdrawals? By doing a "slow downward taper" like this one, it is unlikely that you will experience any significant withdrawal symptoms. Typically, what triggers withdrawals is the sudden stop of a high  dose opioid therapy. Withdrawals can usually be avoided by slowly decreasing the dose over a prolonged period of time.  What are withdrawals? Withdrawals: refers to the wide range of symptoms that occur after stopping or dramatically reducing opiate drugs after heavy and prolonged use. Withdrawal symptoms do not occur to patients that use low dose opioids, or those who take the medication sporadically. Contrary to benzodiazepine (example: Valium, Xanax, etc.) or alcohol withdrawals ("Delirium Tremens"), opioid withdrawals are not lethal. Withdrawals are the physical manifestation of the body getting rid of the excess receptors.  Expected Symptoms Early symptoms of withdrawal may include: . Agitation . Anxiety . Muscle aches . Increased tearing . Insomnia . Runny nose . Sweating . Yawning  Late symptoms of withdrawal may include: . Abdominal cramping . Diarrhea . Dilated pupils . Goose bumps . Nausea . Vomiting  Will I experience withdrawals? Due to the slow nature of the taper, it is very unlikely that you will experience any.  What is a slow taper? Taper: refers to the gradual decrease in dose.  ___________________________________________________________________________________________    ____________________________________________________________________________________________  Medication Rules  Purpose: To inform patients, and their family members, of our rules and regulations.  Applies to: All patients receiving prescriptions (written or electronic).  Pharmacy of record: Pharmacy where electronic prescriptions will be sent. If written prescriptions are taken to a different pharmacy, please inform the nursing staff. The pharmacy listed in the electronic medical record should be the one where you would like electronic prescriptions to be sent.  Electronic   prescriptions: In compliance with the [place] Strengthen Opioid Misuse Prevention (STOP) Act of 2017 (Session  Law 2017-74/H243), effective September 12, 2018, all controlled substances must be electronically prescribed. Calling prescriptions to the pharmacy will cease to exist.  Prescription refills: Only during scheduled appointments. Applies to all prescriptions.  NOTE: The following applies primarily to controlled substances (Opioid* Pain Medications).   Type of encounter (visit): For patients receiving controlled substances, face-to-face visits are required. (Not an option or up to the patient.)  Patient's responsibilities: 1. Pain Pills: Bring all pain pills to every appointment (except for procedure appointments). 2. Pill Bottles: Bring pills in original pharmacy bottle. Always bring the newest bottle. Bring bottle, even if empty. 3. Medication refills: You are responsible for knowing and keeping track of what medications you take and those you need refilled. The day before your appointment: write a list of all prescriptions that need to be refilled. The day of the appointment: give the list to the admitting nurse. Prescriptions will be written only during appointments. No prescriptions will be written on procedure days. If you forget a medication: it will not be "Called in", "Faxed", or "electronically sent". You will need to get another appointment to get these prescribed. No early refills. Do not call asking to have your prescription filled early. 4. Prescription Accuracy: You are responsible for carefully inspecting your prescriptions before leaving our office. Have the discharge nurse carefully go over each prescription with you, before taking them home. Make sure that your name is accurately spelled, that your address is correct. Check the name and dose of your medication to make sure it is accurate. Check the number of pills, and the written instructions to make sure they are clear and accurate. Make sure that you are given enough medication to last until your next medication refill  appointment. 5. Taking Medication: Take medication as prescribed. When it comes to controlled substances, taking less pills or less frequently than prescribed is permitted and encouraged. Never take more pills than instructed. Never take medication more frequently than prescribed.  6. Inform other Doctors: Always inform, all of your healthcare providers, of all the medications you take. 7. Pain Medication from other Providers: You are not allowed to accept any additional pain medication from any other Doctor or Healthcare provider. There are two exceptions to this rule. (see below) In the event that you require additional pain medication, you are responsible for notifying us, as stated below. 8. Medication Agreement: You are responsible for carefully reading and following our Medication Agreement. This must be signed before receiving any prescriptions from our practice. Safely store a copy of your signed Agreement. Violations to the Agreement will result in no further prescriptions. (Additional copies of our Medication Agreement are available upon request.) 9. Laws, Rules, & Regulations: All patients are expected to follow all Federal and State Laws, Statutes, Rules, & Regulations. Ignorance of the Laws does not constitute a valid excuse.  10. Illegal drugs and Controlled Substances: The use of illegal substances (including, but not limited to marijuana and its derivatives) and/or the illegal use of any controlled substances is strictly prohibited. Violation of this rule may result in the immediate and permanent discontinuation of any and all prescriptions being written by our practice. The use of any illegal substances is prohibited. 11. Adopted CDC guidelines & recommendations: Target dosing levels will be at or below 60 MME/day. Use of benzodiazepines** is not recommended.  Exceptions: There are only two exceptions to the rule of not   receiving pain medications from other Healthcare  Providers. 1. Exception #1 (Emergencies): In the event of an emergency (i.e.: accident requiring emergency care), you are allowed to receive additional pain medication. However, you are responsible for: As soon as you are able, call our office [phone], at any time of the day or night, and leave a message stating your name, the date and nature of the emergency, and the name and dose of the medication prescribed. In the event that your call is answered by a member of our staff, make sure to document and save the date, time, and the name of the person that took your information.  2. Exception #2 (Planned Surgery): In the event that you are scheduled by another doctor or dentist to have any type of surgery or procedure, you are allowed (for a period no longer than 30 days), to receive additional pain medication, for the acute post-op pain. However, in this case, you are responsible for picking up a copy of our "Post-op Pain Management for Surgeons" handout, and giving it to your surgeon or dentist. This document is available at our office, and does not require an appointment to obtain it. Simply go to our office during business hours (Monday-Thursday from 8:00 AM to 4:00 PM) (Friday 8:00 AM to 12:00 Noon) or if you have a scheduled appointment with us, prior to your surgery, and ask for it by name. In addition, you will need to provide us with your name, name of your surgeon, type of surgery, and date of procedure or surgery.  *Opioid medications include: morphine, codeine, oxycodone, oxymorphone, hydrocodone, hydromorphone, meperidine, tramadol, tapentadol, buprenorphine, fentanyl, methadone. **Benzodiazepine medications include: diazepam (Valium), alprazolam (Xanax), clonazepam (Klonopine), lorazepam (Ativan), clorazepate (Tranxene), chlordiazepoxide (Librium), estazolam (Prosom), oxazepam (Serax), temazepam (Restoril), triazolam (Halcion) (Last updated:  11/09/2017) ____________________________________________________________________________________________   ____________________________________________________________________________________________  Medication Recommendations and Reminders  Applies to: All patients receiving prescriptions (written and/or electronic).  Medication Rules & Regulations: These rules and regulations exist for your safety and that of others. They are not flexible and neither are we. Dismissing or ignoring them will be considered "non-compliance" with medication therapy, resulting in complete and irreversible termination of such therapy. (See document titled "Medication Rules" for more details.) In all conscience, because of safety reasons, we cannot continue providing a therapy where the patient does not follow instructions.  Pharmacy of record:   Definition: This is the pharmacy where your electronic prescriptions will be sent.   We do not endorse any particular pharmacy.  You are not restricted in your choice of pharmacy.  The pharmacy listed in the electronic medical record should be the one where you want electronic prescriptions to be sent.  If you choose to change pharmacy, simply notify our nursing staff of your choice of new pharmacy.  Recommendations:  Keep all of your pain medications in a safe place, under lock and key, even if you live alone.   After you fill your prescription, take 1 week's worth of pills and put them away in a safe place. You should keep a separate, properly labeled bottle for this purpose. The remainder should be kept in the original bottle. Use this as your primary supply, until it runs out. Once it's gone, then you know that you have 1 week's worth of medicine, and it is time to come in for a prescription refill. If you do this correctly, it is unlikely that you will ever run out of medicine.  To make sure that the above recommendation works,   it is very important that you  make sure your medication refill appointments are scheduled at least 1 week before you run out of medicine. To do this in an effective manner, make sure that you do not leave the office without scheduling your next medication management appointment. Always ask the nursing staff to show you in your prescription , when your medication will be running out. Then arrange for the receptionist to get you a return appointment, at least 7 days before you run out of medicine. Do not wait until you have 1 or 2 pills left, to come in. This is very poor planning and does not take into consideration that we may need to cancel appointments due to bad weather, sickness, or emergencies affecting our staff.  "Partial Fill": If for any reason your pharmacy does not have enough pills/tablets to completely fill or refill your prescription, do not allow for a "partial fill". You will need a separate prescription to fill the remaining amount, which we will not provide. If the reason for the partial fill is your insurance, you will need to talk to the pharmacist about payment alternatives for the remaining tablets, but again, do not accept a partial fill.  Prescription refills and/or changes in medication(s):   Prescription refills, and/or changes in dose or medication, will be conducted only during scheduled medication management appointments. (Applies to both, written and electronic prescriptions.)  No refills on procedure days. No medication will be changed or started on procedure days. No changes, adjustments, and/or refills will be conducted on a procedure day. Doing so will interfere with the diagnostic portion of the procedure.  No phone refills. No medications will be "called into the pharmacy".  No Fax refills.  No weekend refills.  No Holliday refills.  No after hours refills.  Remember:  Business hours are:  Monday to Thursday 8:00 AM to 4:00 PM Provider's Schedule: Luree Palla, MD - Appointments  are:  Medication management: Monday and Wednesday 8:00 AM to 4:00 PM Procedure day: Tuesday and Thursday 7:30 AM to 4:00 PM Bilal Lateef, MD - Appointments are:  Medication management: Tuesday and Thursday 8:00 AM to 4:00 PM Procedure day: Monday and Wednesday 7:30 AM to 4:00 PM (Last update: 11/09/2017) ____________________________________________________________________________________________   ____________________________________________________________________________________________  CANNABIDIOL (AKA: CBD Oil or Pills)  Applies to: All patients receiving prescriptions of controlled substances (written and/or electronic).  General Information: Cannabidiol (CBD) was discovered in 1940. It is one of some 113 identified cannabinoids in cannabis (Marijuana) plants, accounting for up to 40% of the plant's extract. As of 2018, preliminary clinical research on cannabidiol included studies of anxiety, cognition, movement disorders, and pain.  Cannabidiol is consummed in multiple ways, including inhalation of cannabis smoke or vapor, as an aerosol spray into the cheek, and by mouth. It may be supplied as CBD oil containing CBD as the active ingredient (no added tetrahydrocannabinol (THC) or terpenes), a full-plant CBD-dominant hemp extract oil, capsules, dried cannabis, or as a liquid solution. CBD is thought not have the same psychoactivity as THC, and may affect the actions of THC. Studies suggest that CBD may interact with different biological targets, including cannabinoid receptors and other neurotransmitter receptors. As of 2018 the mechanism of action for its biological effects has not been determined.  In the [place], cannabidiol has a limited approval by the Food and Drug Administration (FDA) for treatment of only two types of epilepsy disorders. The side effects of long-term use of the drug include somnolence, decreased appetite, diarrhea,   fatigue, malaise, weakness, sleeping  problems, and others.  CBD remains a Schedule I drug prohibited for any use.  Legality: Some manufacturers ship CBD products nationally, an illegal action which the FDA has not enforced in 2018, with CBD remaining the subject of an FDA investigational new drug evaluation, and is not considered legal as a dietary supplement or food ingredient as of December 2018. Federal illegality has made it difficult historically to conduct research on CBD. CBD is openly sold in head shops and health food stores in some states where such sales have not been explicitly legalized.  Warning: Because it is not FDA approved for general use or treatment of pain, it is not required to undergo the same manufacturing controls as prescription drugs.  This means that the available cannabidiol (CBD) may be contaminated with THC.  If this is the case, it will trigger a positive urine drug screen (UDS) test for cannabinoids (Marijuana).  Because a positive UDS for illicit substances is a violation of our medication agreement, your opioid analgesics (pain medicine) may be permanently discontinued. (Last update: 11/30/2017) ____________________________________________________________________________________________   ____________________________________________________________________________________________  Preparing for Procedure with Sedation  Procedure appointments are limited to planned procedures: . No Prescription Refills. . No disability issues will be discussed. . No medication changes will be discussed.  Instructions: . Oral Intake: Do not eat or drink anything for at least 8 hours prior to your procedure. (Exception: Blood Pressure Medication. See below.) . Transportation: Unless otherwise stated by your physician, you may drive yourself after the procedure. . Blood Pressure Medicine: Do not forget to take your blood pressure medicine with a sip of water the morning of the procedure. If your Diastolic (lower  reading)is above 100 mmHg, elective cases will be cancelled/rescheduled. . Blood thinners: These will need to be stopped for procedures. Notify our staff if you are taking any blood thinners. Depending on which one you take, there will be specific instructions on how and when to stop it. . Diabetics on insulin: Notify the staff so that you can be scheduled 1st case in the morning. If your diabetes requires high dose insulin, take only  of your normal insulin dose the morning of the procedure and notify the staff that you have done so. . Preventing infections: Shower with an antibacterial soap the morning of your procedure. . Build-up your immune system: Take 1000 mg of Vitamin C with every meal (3 times a day) the day prior to your procedure. Marland Kitchen Antibiotics: Inform the staff if you have a condition or reason that requires you to take antibiotics before dental procedures. . Pregnancy: If you are pregnant, call and cancel the procedure. . Sickness: If you have a cold, fever, or any active infections, call and cancel the procedure. . Arrival: You must be in the facility at least 30 minutes prior to your scheduled procedure. . Children: Do not bring children with you. . Dress appropriately: Bring dark clothing that you would not mind if they get stained. . Valuables: Do not bring any jewelry or valuables.  Reasons to call and reschedule or cancel your procedure: (Following these recommendations will minimize the risk of a serious complication.) . Surgeries: Avoid having procedures within 2 weeks of any surgery. (Avoid for 2 weeks before or after any surgery). . Flu Shots: Avoid having procedures within 2 weeks of a flu shots or . (Avoid for 2 weeks before or after immunizations). . Barium: Avoid having a procedure within 7-10 days after having had a radiological  study involving the use of radiological contrast. (Myelograms, Barium swallow or enema study). . Heart attacks: Avoid any elective procedures  or surgeries for the initial 6 months after a "Myocardial Infarction" (Heart Attack). . Blood thinners: It is imperative that you stop these medications before procedures. Let us know if you if you take any blood thinner.  . Infection: Avoid procedures during or within two weeks of an infection (including chest colds or gastrointestinal problems). Symptoms associated with infections include: Localized redness, fever, chills, night sweats or profuse sweating, burning sensation when voiding, cough, congestion, stuffiness, runny nose, sore throat, diarrhea, nausea, vomiting, cold or Flu symptoms, recent or current infections. It is specially important if the infection is over the area that we intend to treat. Marland Kitchen Heart and lung problems: Symptoms that may suggest an active cardiopulmonary problem include: cough, chest pain, breathing difficulties or shortness of breath, dizziness, ankle swelling, uncontrolled high or unusually low blood pressure, and/or palpitations. If you are experiencing any of these symptoms, cancel your procedure and contact your primary care physician for an evaluation.  Remember:  Regular Business hours are:  Monday to Thursday 8:00 AM to 4:00 PM  Provider's Schedule: Milinda Pointer, MD:  Procedure days: Tuesday and Thursday 7:30 AM to 4:00 PM  Gillis Santa, MD:  Procedure days: Monday and Wednesday 7:30 AM to 4:00 PM ____________________________________________________________________________________________

## 2020-03-13 ENCOUNTER — Other Ambulatory Visit
Admission: RE | Admit: 2020-03-13 | Discharge: 2020-03-13 | Disposition: A | Discharge status: Home / Self Care | Payer: Medicaid Other | Source: Ambulatory Visit | Attending: Pain Medicine | Admitting: Pain Medicine

## 2020-03-13 DIAGNOSIS — Z79899 Other long term (current) drug therapy: Secondary | ICD-10-CM | POA: Diagnosis not present

## 2020-03-13 DIAGNOSIS — G894 Chronic pain syndrome: Secondary | ICD-10-CM | POA: Diagnosis present

## 2020-03-13 LAB — URINE DRUG SCREEN, QUALITATIVE (ARMC ONLY)
Amphetamines, Ur Screen: NOT DETECTED
Barbiturates, Ur Screen: NOT DETECTED
Benzodiazepine, Ur Scrn: NOT DETECTED
Cannabinoid 50 Ng, Ur ~~LOC~~: NOT DETECTED
Cocaine Metabolite,Ur ~~LOC~~: NOT DETECTED
MDMA (Ecstasy)Ur Screen: NOT DETECTED
Methadone Scn, Ur: NOT DETECTED
Opiate, Ur Screen: POSITIVE — AB
Phencyclidine (PCP) Ur S: NOT DETECTED
Tricyclic, Ur Screen: NOT DETECTED

## 2020-03-18 ENCOUNTER — Telehealth: Payer: Medicaid Other | Admitting: Pain Medicine

## 2020-03-23 NOTE — Progress Notes (Deleted)
Rescheduled

## 2020-03-24 ENCOUNTER — Ambulatory Visit: Payer: Medicaid Other | Admitting: Pain Medicine

## 2020-03-31 ENCOUNTER — Ambulatory Visit: Payer: Medicaid Other | Admitting: Pain Medicine

## 2020-04-07 ENCOUNTER — Other Ambulatory Visit: Payer: Self-pay | Admitting: Pain Medicine

## 2020-04-07 DIAGNOSIS — M159 Polyosteoarthritis, unspecified: Secondary | ICD-10-CM

## 2020-04-07 DIAGNOSIS — M150 Primary generalized (osteo)arthritis: Secondary | ICD-10-CM

## 2020-04-14 ENCOUNTER — Ambulatory Visit (HOSPITAL_BASED_OUTPATIENT_CLINIC_OR_DEPARTMENT_OTHER): Payer: Medicaid Other | Admitting: Pain Medicine

## 2020-04-14 ENCOUNTER — Encounter: Payer: Self-pay | Admitting: Pain Medicine

## 2020-04-14 ENCOUNTER — Other Ambulatory Visit: Payer: Self-pay

## 2020-04-14 ENCOUNTER — Ambulatory Visit
Admission: RE | Admit: 2020-04-14 | Discharge: 2020-04-14 | Disposition: A | Discharge status: Home / Self Care | Payer: Medicaid Other | Source: Ambulatory Visit | Attending: Pain Medicine | Admitting: Pain Medicine

## 2020-04-14 ENCOUNTER — Telehealth: Payer: Self-pay | Admitting: *Deleted

## 2020-04-14 VITALS — BP 126/80 | HR 90 | Temp 97.4°F | Resp 18 | Ht 64.0 in | Wt 265.0 lb

## 2020-04-14 DIAGNOSIS — M47816 Spondylosis without myelopathy or radiculopathy, lumbar region: Secondary | ICD-10-CM | POA: Insufficient documentation

## 2020-04-14 DIAGNOSIS — Z6841 Body Mass Index (BMI) 40.0 and over, adult: Secondary | ICD-10-CM | POA: Diagnosis present

## 2020-04-14 DIAGNOSIS — G8929 Other chronic pain: Secondary | ICD-10-CM

## 2020-04-14 DIAGNOSIS — M545 Low back pain, unspecified: Secondary | ICD-10-CM

## 2020-04-14 DIAGNOSIS — M5136 Other intervertebral disc degeneration, lumbar region: Secondary | ICD-10-CM | POA: Insufficient documentation

## 2020-04-14 DIAGNOSIS — M47817 Spondylosis without myelopathy or radiculopathy, lumbosacral region: Secondary | ICD-10-CM | POA: Insufficient documentation

## 2020-04-14 MED ORDER — MIDAZOLAM HCL 5 MG/5ML IJ SOLN
INTRAMUSCULAR | Status: AC
  Filled 2020-04-14: qty 5

## 2020-04-14 MED ORDER — LIDOCAINE HCL 2 % IJ SOLN
INTRAMUSCULAR | Status: AC
  Filled 2020-04-14: qty 20

## 2020-04-14 MED ORDER — LIDOCAINE HCL 2 % IJ SOLN
20.0000 mL | Freq: Once | INTRAMUSCULAR | Status: AC
  Administered 2020-04-14: 400 mg

## 2020-04-14 MED ORDER — ROPIVACAINE HCL 2 MG/ML IJ SOLN
18.0000 mL | Freq: Once | INTRAMUSCULAR | Status: AC
  Administered 2020-04-14: 18 mL via PERINEURAL

## 2020-04-14 MED ORDER — TRIAMCINOLONE ACETONIDE 40 MG/ML IJ SUSP
80.0000 mg | Freq: Once | INTRAMUSCULAR | Status: AC
  Administered 2020-04-14: 80 mg

## 2020-04-14 MED ORDER — FENTANYL CITRATE (PF) 100 MCG/2ML IJ SOLN
INTRAMUSCULAR | Status: AC
  Filled 2020-04-14: qty 2

## 2020-04-14 MED ORDER — TRIAMCINOLONE ACETONIDE 40 MG/ML IJ SUSP
INTRAMUSCULAR | Status: AC
  Filled 2020-04-14: qty 2

## 2020-04-14 MED ORDER — ROPIVACAINE HCL 2 MG/ML IJ SOLN
INTRAMUSCULAR | Status: AC
  Filled 2020-04-14: qty 20

## 2020-04-14 MED ORDER — MIDAZOLAM HCL 5 MG/5ML IJ SOLN
1.0000 mg | INTRAMUSCULAR | Status: DC | PRN
  Administered 2020-04-14: 2 mg via INTRAVENOUS

## 2020-04-14 MED ORDER — LACTATED RINGERS IV SOLN
1000.0000 mL | Freq: Once | INTRAVENOUS | Status: AC
  Administered 2020-04-14: 1000 mL via INTRAVENOUS

## 2020-04-14 MED ORDER — FENTANYL CITRATE (PF) 100 MCG/2ML IJ SOLN
25.0000 ug | INTRAMUSCULAR | Status: DC | PRN
  Administered 2020-04-14: 50 ug via INTRAVENOUS

## 2020-04-14 NOTE — Telephone Encounter (Signed)
Pharmacist advised that most recent script was for Meloxicam, no orders for Ibuprofen.

## 2020-04-14 NOTE — Progress Notes (Signed)
Safety precautions to be maintained throughout the outpatient stay will include: orient to surroundings, keep bed in low position, maintain call bell within reach at all times, provide assistance with transfer out of bed and ambulation.  

## 2020-04-14 NOTE — Patient Instructions (Addendum)

## 2020-04-14 NOTE — Progress Notes (Signed)
PROVIDER NOTE: Information contained herein reflects review and annotations entered in association with encounter. Interpretation of such information and data should be left to medically-trained personnel. Information provided to patient can be located elsewhere in the medical record under "Patient Instructions". Document created using STT-dictation technology, any transcriptional errors that may result from process are unintentional.    Patient: Lauren [name]  Service Category: Procedure  Provider: Gaspar Cola, MD  DOB: 08/05/1976  DOS: 04/14/2020  Location: [facility]  MRN: [id number]  Setting: Ambulatory - outpatient  Referring Provider: Lorelee Market, MD  Type: Established Patient  Specialty: Interventional Pain Management  PCP: Lorelee Market, MD   Primary Reason for Visit: Interventional Pain Management Treatment. CC: Back Pain (lower) and Knee Pain  Procedure:          Anesthesia, Analgesia, Anxiolysis:  Type: Lumbar Facet, Medial Branch Block(s)          Primary Purpose: Diagnostic Region: Posterolateral Lumbosacral Spine Level: L2, L3, L4, L5, & S1 Medial Branch Level(s). Injecting these levels blocks the L3-4, L4-5, and L5-S1 lumbar facet joints. Laterality: Bilateral  Type: Moderate (Conscious) Sedation combined with Local Anesthesia Indication(s): Analgesia and Anxiety Route: Intravenous (IV) IV Access: Secured Sedation: Meaningful verbal contact was maintained at all times during the procedure  Local Anesthetic: Lidocaine 1-2%  Position: Prone   Indications: 1. Spondylosis without myelopathy or radiculopathy, lumbosacral region   2. Lumbar facet syndrome (Bilateral) (R>L)   3. Lumbar spondylosis   4. DDD (degenerative disc disease), lumbar   5. Chronic low back pain (5th area of Pain) (Bilateral) (R>L) w/o sciatica   6. Morbid obesity with body mass index (BMI) of 45.0 to 49.9 in adult [facility])    Pain Score: Pre-procedure: 7  /10 Post-procedure: 0-No pain/10   Pre-op Assessment:  Lauren [name] is a 44 y.o. (year old), female patient, seen today for interventional treatment. She  has a past surgical history that includes Rotator cuff repair; Knee surgery; Cholecystectomy; Cesarean section (2010); Cesarean section (2011); Tonsillectomy; Cardiac electrophysiology study and ablation (01/18/2018); and Excision mass neck (Left, 10/11/2018). [name] has a current medication list which includes the following prescription(s): albuterol, aspirin ec, beclomethasone, diltiazem, esomeprazole, ferrous sulfate, furosemide, hydrocodone-acetaminophen, [START ON 05/09/2020] hydrocodone-acetaminophen, losartan-hydrochlorothiazide, medroxyprogesterone acetate, meloxicam, metoprolol succinate, fish oil, pregabalin, stiolto respimat, turmeric, diltiazem, fluticasone, and hydrocodone-acetaminophen, and the following Facility-Administered Medications: fentanyl and midazolam. Her primarily concern today is the Back Pain (lower) and Knee Pain  Initial Vital Signs:  Pulse/HCG Rate: 90ECG Heart Rate: 88 Temp: (!) 97.1 F (36.2 C) Resp: 16 BP: 119/90 SpO2: 100 %  BMI: Estimated body mass index is 45.49 kg/m as calculated from the following:   Height as of this encounter: 5\' 4"  (1.626 m).   Weight as of this encounter: 265 lb (120.2 kg).  Risk Assessment: Allergies: Reviewed. She is allergic to nucynta [tapentadol] and tape.  Allergy Precautions: None required Coagulopathies: Reviewed. None identified.  Blood-thinner therapy: None at this time Active Infection(s): Reviewed. None identified. Lauren [name] is afebrile  Site Confirmation: Ms. Tanney was asked to confirm the procedure and laterality before marking the site Procedure checklist: Completed Consent: Before the procedure and under the influence of no sedative(s), amnesic(s), or anxiolytics, the patient was informed of the treatment options, risks and possible complications.  To fulfill our ethical and legal obligations, as recommended by the American Medical Association's Code of Ethics, I have informed the patient of my clinical impression; the nature and purpose of the treatment  or procedure; the risks, benefits, and possible complications of the intervention; the alternatives, including doing nothing; the risk(s) and benefit(s) of the alternative treatment(s) or procedure(s); and the risk(s) and benefit(s) of doing nothing. The patient was provided information about the general risks and possible complications associated with the procedure. These may include, but are not limited to: failure to achieve desired goals, infection, bleeding, organ or nerve damage, allergic reactions, paralysis, and death. In addition, the patient was informed of those risks and complications associated to Spine-related procedures, such as failure to decrease pain; infection (i.e.: Meningitis, epidural or intraspinal abscess); bleeding (i.e.: epidural hematoma, subarachnoid hemorrhage, or any other type of intraspinal or peri-dural bleeding); organ or nerve damage (i.e.: Any type of peripheral nerve, nerve root, or spinal cord injury) with subsequent damage to sensory, motor, and/or autonomic systems, resulting in permanent pain, numbness, and/or weakness of one or several areas of the body; allergic reactions; (i.e.: anaphylactic reaction); and/or death. Furthermore, the patient was informed of those risks and complications associated with the medications. These include, but are not limited to: allergic reactions (i.e.: anaphylactic or anaphylactoid reaction(s)); adrenal axis suppression; blood sugar elevation that in diabetics may result in ketoacidosis or comma; water retention that in patients with history of congestive heart failure may result in shortness of breath, pulmonary edema, and decompensation with resultant heart failure; weight gain; swelling or edema; medication-induced neural toxicity;  particulate matter embolism and blood vessel occlusion with resultant organ, and/or nervous system infarction; and/or aseptic necrosis of one or more joints. Finally, the patient was informed that Medicine is not an exact science; therefore, there is also the possibility of unforeseen or unpredictable risks and/or possible complications that may result in a catastrophic outcome. The patient indicated having understood very clearly. We have given the patient no guarantees and we have made no promises. Enough time was given to the patient to ask questions, all of which were answered to the patient's satisfaction. Ms. Alcantar has indicated that she wanted to continue with the procedure. Attestation: I, the ordering provider, attest that I have discussed with the patient the benefits, risks, side-effects, alternatives, likelihood of achieving goals, and potential problems during recovery for the procedure that I have provided informed consent. Date  Time: 04/14/2020  7:55 AM  Pre-Procedure Preparation:  Monitoring: As per clinic protocol. Respiration, ETCO2, SpO2, BP, heart rate and rhythm monitor placed and checked for adequate function Safety Precautions: Patient was assessed for positional comfort and pressure points before starting the procedure. Time-out: I initiated and conducted the "Time-out" before starting the procedure, as per protocol. The patient was asked to participate by confirming the accuracy of the "Time Out" information. Verification of the correct person, site, and procedure were performed and confirmed by me, the nursing staff, and the patient. "Time-out" conducted as per Joint Commission's Universal Protocol (UP.01.01.01). Time: 1937  Description of Procedure:          Laterality: Bilateral. The procedure was performed in identical fashion on both sides. Levels:  L2, L3, L4, L5, & S1 Medial Branch Level(s) Area Prepped: Posterior Lumbosacral Region DuraPrep (Iodine Povacrylex [0.7%  available iodine] and Isopropyl Alcohol, 74% w/w) Safety Precautions: Aspiration looking for blood return was conducted prior to all injections. At no point did we inject any substances, as a needle was being advanced. Before injecting, the patient was told to immediately notify me if she was experiencing any new onset of "ringing in the ears, or metallic taste in the mouth". No attempts were  made at seeking any paresthesias. Safe injection practices and needle disposal techniques used. Medications properly checked for expiration dates. SDV (single dose vial) medications used. After the completion of the procedure, all disposable equipment used was discarded in the proper designated medical waste containers. Local Anesthesia: Protocol guidelines were followed. The patient was positioned over the fluoroscopy table. The area was prepped in the usual manner. The time-out was completed. The target area was identified using fluoroscopy. A 12-in long, straight, sterile hemostat was used with fluoroscopic guidance to locate the targets for each level blocked. Once located, the skin was marked with an approved surgical skin marker. Once all sites were marked, the skin (epidermis, dermis, and hypodermis), as well as deeper tissues (fat, connective tissue and muscle) were infiltrated with a small amount of a short-acting local anesthetic, loaded on a 10cc syringe with a 25G, 1.5-in  Needle. An appropriate amount of time was allowed for local anesthetics to take effect before proceeding to the next step. Local Anesthetic: Lidocaine 2.0% The unused portion of the local anesthetic was discarded in the proper designated containers. Technical explanation of process:  L2 Medial Branch Nerve Block (MBB): The target area for the L2 medial branch is at the junction of the postero-lateral aspect of the superior articular process and the superior, posterior, and medial edge of the transverse process of L3. Under fluoroscopic  guidance, a Quincke needle was inserted until contact was made with os over the superior postero-lateral aspect of the pedicular shadow (target area). After negative aspiration for blood, 0.5 mL of the nerve block solution was injected without difficulty or complication. The needle was removed intact. L3 Medial Branch Nerve Block (MBB): The target area for the L3 medial branch is at the junction of the postero-lateral aspect of the superior articular process and the superior, posterior, and medial edge of the transverse process of L4. Under fluoroscopic guidance, a Quincke needle was inserted until contact was made with os over the superior postero-lateral aspect of the pedicular shadow (target area). After negative aspiration for blood, 0.5 mL of the nerve block solution was injected without difficulty or complication. The needle was removed intact. L4 Medial Branch Nerve Block (MBB): The target area for the L4 medial branch is at the junction of the postero-lateral aspect of the superior articular process and the superior, posterior, and medial edge of the transverse process of L5. Under fluoroscopic guidance, a Quincke needle was inserted until contact was made with os over the superior postero-lateral aspect of the pedicular shadow (target area). After negative aspiration for blood, 0.5 mL of the nerve block solution was injected without difficulty or complication. The needle was removed intact. L5 Medial Branch Nerve Block (MBB): The target area for the L5 medial branch is at the junction of the postero-lateral aspect of the superior articular process and the superior, posterior, and medial edge of the sacral ala. Under fluoroscopic guidance, a Quincke needle was inserted until contact was made with os over the superior postero-lateral aspect of the pedicular shadow (target area). After negative aspiration for blood, 0.5 mL of the nerve block solution was injected without difficulty or complication. The  needle was removed intact. S1 Medial Branch Nerve Block (MBB): The target area for the S1 medial branch is at the posterior and inferior 6 o'clock position of the L5-S1 facet joint. Under fluoroscopic guidance, the Quincke needle inserted for the L5 MBB was redirected until contact was made with os over the inferior and postero aspect of the  sacrum, at the 6 o' clock position under the L5-S1 facet joint (Target area). After negative aspiration for blood, 0.5 mL of the nerve block solution was injected without difficulty or complication. The needle was removed intact.  Nerve block solution: 0.2% PF-Ropivacaine + Triamcinolone (40 mg/mL) diluted to a final concentration of 4 mg of Triamcinolone/mL of Ropivacaine The unused portion of the solution was discarded in the proper designated containers. Procedural Needles: 22-gauge, 7-inch, Quincke needles used for all levels.  Once the entire procedure was completed, the treated area was cleaned, making sure to leave some of the prepping solution back to take advantage of its long term bactericidal properties.   Illustration of the posterior view of the lumbar spine and the posterior neural structures. Laminae of L2 through S1 are labeled. DPRL5, dorsal primary ramus of L5; DPRS1, dorsal primary ramus of S1; DPR3, dorsal primary ramus of L3; FJ, facet (zygapophyseal) joint L3-L4; I, inferior articular process of L4; LB1, lateral branch of dorsal primary ramus of L1; IAB, inferior articular branches from L3 medial branch (supplies L4-L5 facet joint); IBP, intermediate branch plexus; MB3, medial branch of dorsal primary ramus of L3; NR3, third lumbar nerve root; S, superior articular process of L5; SAB, superior articular branches from L4 (supplies L4-5 facet joint also); TP3, transverse process of L3.  Vitals:   04/14/20 0852 04/14/20 0858 04/14/20 0908 04/14/20 0918  BP: (!) 132/96 127/82 122/86 126/80  Pulse:      Resp: 19 18 18 18   Temp:  (!) 97.4 F (36.3  C)    SpO2: 96% 100% 100% 100%  Weight:      Height:         Start Time: 0839 hrs. End Time: 0849 hrs.  Imaging Guidance (Spinal):          Type of Imaging Technique: Fluoroscopy Guidance (Spinal) Indication(s): Assistance in needle guidance and placement for procedures requiring needle placement in or near specific anatomical locations not easily accessible without such assistance. Exposure Time: Please see nurses notes. Contrast: None used. Fluoroscopic Guidance: I was personally present during the use of fluoroscopy. "Tunnel Vision Technique" used to obtain the best possible view of the target area. Parallax error corrected before commencing the procedure. "Direction-depth-direction" technique used to introduce the needle under continuous pulsed fluoroscopy. Once target was reached, antero-posterior, oblique, and lateral fluoroscopic projection used confirm needle placement in all planes. Images permanently stored in EMR. Interpretation: No contrast injected. I personally interpreted the imaging intraoperatively. Adequate needle placement confirmed in multiple planes. Permanent images saved into the patient's record.  Antibiotic Prophylaxis:   Anti-infectives (From admission, onward)   None     Indication(s): None identified  Post-operative Assessment:  Post-procedure Vital Signs:  Pulse/HCG Rate: 9092 Temp: (!) 97.4 F (36.3 C) Resp: 18 BP: 126/80 SpO2: 100 %  EBL: None  Complications: No immediate post-treatment complications observed by team, or reported by patient.  Note: The patient tolerated the entire procedure well. A repeat set of vitals were taken after the procedure and the patient was kept under observation following institutional policy, for this type of procedure. Post-procedural neurological assessment was performed, showing return to baseline, prior to discharge. The patient was provided with post-procedure discharge instructions, including a section on how  to identify potential problems. Should any problems arise concerning this procedure, the patient was given instructions to immediately contact us, at any time, without hesitation. In any case, we plan to contact the patient by telephone for a follow-up status report  regarding this interventional procedure.  Comments:  No additional relevant information.  Plan of Care  Orders:  Orders Placed This Encounter  Procedures  . LUMBAR FACET(MEDIAL BRANCH NERVE BLOCK) MBNB    Scheduling Instructions:     Procedure: Lumbar facet block (AKA.: Lumbosacral medial branch nerve block)     Side: Bilateral     Level: L3-4, L4-5, & L5-S1 Facets (L2, L3, L4, L5, & S1 Medial Branch Nerves)     Sedation: Patient's choice.     Timeframe: Today    Order Specific Question:   Where will this procedure be performed?    Answer:   [facility]  . DG PA[facility] C-ARM 1-60 MIN NO REPORT    Intraoperative interpretation by procedural physician at [facility].    Standing Status:   Standing    Number of Occurrences:   1    Order Specific Question:   Reason for exam:    Answer:   Assistance in needle guidance and placement for procedures requiring needle placement in or near specific anatomical locations not easily accessible without such assistance.  . Informed Consent Details: Physician/Practitioner Attestation; Transcribe to consent form and obtain patient signature    Nursing Order: Transcribe to consent form and obtain patient signature. Note: Always confirm laterality of pain with Ms. Swier, before procedure. Procedure: Lumbar Facet Block  under fluoroscopic guidance Indication/Reason: Low Back Pain, with our without leg pain, due to Facet Joint Arthralgia (Joint Pain) known as Lumbar Facet Syndrome, secondary to Lumbar, and/or Lumbosacral Spondylosis (Arthritis of the Spine), without myelopathy or radiculopathy (Nerve Damage). Provider Attestation: I, E. Lopez Dossie Arbour, MD, (Pain  Management Specialist), the physician/practitioner, attest that I have discussed with the patient the benefits, risks, side effects, alternatives, likelihood of achieving goals and potential problems during recovery for the procedure that I have provided informed consent.  . Provide equipment / supplies at bedside    Equipment required: Single use, disposable, "Block Tray"    Standing Status:   Standing    Number of Occurrences:   1    Order Specific Question:   Specify    Answer:   Block Tray   Chronic Opioid Analgesic:  Hydrocodone/APAP 5/325, 1 tab PO q 8 hrs (15 mg/day of hydrocodone) MME/day: 15 mg/day.   Medications ordered for procedure: Meds ordered this encounter  Medications  . lidocaine (XYLOCAINE) 2 % (with pres) injection 400 mg  . lactated ringers infusion 1,000 mL  . midazolam (VERSED) 5 MG/5ML injection 1-2 mg    Make sure Flumazenil is available in the pyxis when using this medication. If oversedation occurs, administer 0.2 mg IV over 15 sec. If after 45 sec no response, administer 0.2 mg again over 1 min; may repeat at 1 min intervals; not to exceed 4 doses (1 mg)  . fentaNYL (SUBLIMAZE) injection 25-50 mcg    Make sure Narcan is available in the pyxis when using this medication. In the event of respiratory depression (RR< 8/min): Titrate NARCAN (naloxone) in increments of 0.1 to 0.2 mg IV at 2-3 minute intervals, until desired degree of reversal.  . ropivacaine (PF) 2 mg/mL (0.2%) (NAROPIN) injection 18 mL  . triamcinolone acetonide (KENALOG-40) injection 80 mg   Medications administered: We administered lidocaine, lactated ringers, midazolam, fentaNYL, ropivacaine (PF) 2 mg/mL (0.2%), and triamcinolone acetonide.  See the medical record for exact dosing, route, and time of administration.  Follow-up plan:   Return in about 2 weeks (around 04/28/2020) for VV(65min), PP-(on eval  day).       Considering: NOTE:NO Lumbar RFAuntil BMIis at or below 35. Diagnostic  bilateral cervical facet NB Diagnostic bilateral cervical ESI Possible bilateral cervical facet RFA Diagnostic bilateral suprascapular NB Possible bilateral suprascapular nerve RFA Diagnostic bilateral LESI Possible bilateral lumbar facet RFA(NO Lumbar RFAuntil BMIis at or below 35.)   Palliative PRN treatment(s): Palliative bilateral IA Hyalgan knee injection S1N3(Fluoro required)(1st - 27mo)(2nd - 3.9 mo) Palliative bilateral IA knee joint (steroid) #2  Diagnostic/therapeutic bilateral knee genicular NB #2 Diagnostic/therapeutic left IA wrist injection (MNB/TPI) #2 Diagnostic bilateral IA shoulder injection #3 Diagnostic left suprascapular NB #2 Therapeutic left L4-5 LESI #4  Diagnostic right lumbar facet block #2      Recent Visits Date Type Provider Dept  03/10/20 Office Visit Milinda Pointer, MD [facility]  03/03/20 Procedure visit Milinda Pointer, MD [facility]  Showing recent visits within past 90 days and meeting all other requirements Today's Visits Date Type Provider Dept  04/14/20 Procedure visit Milinda Pointer, MD [facility]  Showing today's visits and meeting all other requirements Future Appointments Date Type Provider Dept  04/27/20 Appointment Milinda Pointer, MD [facility]  Showing future appointments within next 90 days and meeting all other requirements  Disposition: Discharge home  Discharge (Date  Time): 04/14/2020; 0920 hrs.   Primary Care Physician: Lorelee Market, MD Location: [facility] Note by: Gaspar Cola, MD Date: 04/14/2020; Time: 10:15 AM  Disclaimer:  Medicine is not an Chief Strategy Officer. The only guarantee in medicine is that nothing is guaranteed. It is important to note that the decision to proceed with this intervention was based on the information collected from the patient. The Data and conclusions were drawn from the patient's  questionnaire, the interview, and the physical examination. Because the information was provided in large part by the patient, it cannot be guaranteed that it has not been purposely or unconsciously manipulated. Every effort has been made to obtain as much relevant data as possible for this evaluation. It is important to note that the conclusions that lead to this procedure are derived in large part from the available data. Always take into account that the treatment will also be dependent on availability of resources and existing treatment guidelines, considered by other Pain Management Practitioners as being common knowledge and practice, at the time of the intervention. For Medico-Legal purposes, it is also important to point out that variation in procedural techniques and pharmacological choices are the acceptable norm. The indications, contraindications, technique, and results of the above procedure should only be interpreted and judged by a Board-Certified Interventional Pain Specialist with extensive familiarity and expertise in the same exact procedure and technique.

## 2020-04-15 ENCOUNTER — Telehealth: Payer: Self-pay

## 2020-04-15 NOTE — Telephone Encounter (Signed)
No answer. Mail box is full unable to leave message.

## 2020-04-24 ENCOUNTER — Encounter: Payer: Self-pay | Admitting: Pain Medicine

## 2020-04-26 NOTE — Progress Notes (Signed)
Patient: Lauren [name]  Service Category: E/M  Provider: Gaspar Cola, MD  DOB: 20-Apr-1976  DOS: 04/27/2020  Location: Office  MRN: [id number]  Setting: Ambulatory outpatient  Referring Provider: Lorelee Market, MD  Type: Established Patient  Specialty: Interventional Pain Management  PCP: Lorelee Market, MD  Location: Remote location  Delivery: TeleHealth     Virtual Encounter - Pain Management PROVIDER NOTE: Information contained herein reflects review and annotations entered in association with encounter. Interpretation of such information and data should be left to medically-trained personnel. Information provided to patient can be located elsewhere in the medical record under "Patient Instructions". Document created using STT-dictation technology, any transcriptional errors that may result from process are unintentional.    Contact & Pharmacy Preferred: [phone] Home: [phone] (home) Mobile: [phone] (mobile) E-mail: [email or web address]  [place] ([place]), [place] - [place] ([place]) [place] [address] Phone: [phone] Fax: [phone]   Pre-screening  Lauren [name] offered "in-person" vs "virtual" encounter. She indicated preferring virtual for this encounter.   Reason COVID-19*  Social distancing based on CDC and AMA recommendations.   I contacted Lauren [name] on 04/27/2020 via telephone.      I clearly identified myself as Gaspar Cola, MD. I verified that I was speaking with the correct person using two identifiers (Name: Lauren [name], and date of birth: 29-May-1976).  Consent I sought verbal advanced consent from [place] for virtual visit interactions. I informed Lauren [name] of possible security and privacy concerns, risks, and limitations associated with providing "not-in-person" medical evaluation and management services. I also informed Lauren [name] of the  availability of "in-person" appointments. Finally, I informed her that there would be a charge for the virtual visit and that she could be  personally, fully or partially, financially responsible for it. Lauren [name] expressed understanding and agreed to proceed.   Historic Elements   Lauren [name] is a 44 y.o. year old, female patient evaluated today after her last contact with our practice on 04/15/2020. Lauren [name]  has a past medical history of Acute shoulder pain (Left) (12/26/2018), Advanced maternal age in multigravida, Asthma, Depression, GERD (gastroesophageal reflux disease), Grand multiparity, Headache, History of carpal tunnel surgery of left wrist (05/2018), Hypertension, Leg fracture, Obesity, morbid, BMI 40.0-49.9 ([place]), Post traumatic stress disorder, Pre-diabetes, Pre-eclampsia, SVT (supraventricular tachycardia) ([place]), and Wrist fracture, closed. She also  has a past surgical history that includes Rotator cuff repair; Knee surgery; Cholecystectomy; Cesarean section (2010); Cesarean section (2011); Tonsillectomy; Cardiac electrophysiology study and ablation (01/18/2018); and Excision mass neck (Left, 10/11/2018). Lauren [name] has a current medication list which includes the following prescription(s): albuterol, aspirin ec, beclomethasone, diltiazem, esomeprazole, ferrous sulfate, furosemide, [START ON 05/09/2020] hydrocodone-acetaminophen, losartan-hydrochlorothiazide, medroxyprogesterone acetate, [START ON 06/08/2020] meloxicam, metoprolol succinate, fish oil, [START ON 06/06/2020] pregabalin, stiolto respimat, [START ON 06/08/2020] turmeric, diltiazem, fluticasone, [START ON 06/08/2020] hydrocodone-acetaminophen, and [START ON 07/08/2020] hydrocodone-acetaminophen. She  reports that she has never smoked. She has never used smokeless tobacco. She reports that she does not drink alcohol and does not use drugs. Lauren [name] is allergic to nucynta [tapentadol] and tape.   HPI  Today, she is  being contacted for both, medication management and a post-procedure assessment.  She indicated having attained good relief of the pain from the bilateral lumbar facet block, but because she is limping due to the pain in her knees, she is indicating that some of this pain is returning then she  is interested in knowing when she could have it injected again.  However, she also pointed out that she would need to have the knees injected first.  Today I gave her the choice between having me inject him and having the knees injected by Dr. Holley Raring.  She indicated that she would need to think about it since she is used to having the injections done by me.  However, when I told her that it may be more than a month before I can get to her, she indicated that she may call to have him take care of that.  Typically I do her injections with fluoroscopy guidance and occasionally she will request sedation.  Today I have also informed the patient that I will be transferring her nonopioid prescriptions to her PCP.  Transfer: Pregabalin (Lyrica) 150 mg capsule, 1 capsule p.o. 3 times daily (90/month) (09/04/2020); meloxicam (Mobic) 15 mg tablet, 1 tablet p.o. daily (30/month) (09/06/2020); turmeric 500 mg caps, 1 capsule p.o. daily (30/month) (09/06/2020).  In terms of the patient's knees, because her last intra-articular Hyalgan knee injection was on 03/03/2020, the next step here would be to do a bilateral diagnostic genicular nerve block and possibly follow-up with radiofrequency ablation.  Clearly, if they Hyalgan series is not lasting even 2 months, this means that she has already lost a significant amount of cartilage and the neck step is to have both knees replaced.  We may be able to offer her some relief with the genicular nerve block and radiofrequency, but it is inevitable that she will need those replacements.  She already had bilateral knee MRIs on 10/06/2018.  Post-Procedure Evaluation  Procedure (04/14/2020): Palliative  bilateral lumbar facet block under fluoroscopic guidance and IV sedation Pre-procedure pain level: 7/10 Post-procedure: 0/10 (100% relief)  Sedation: Sedation provided.  Effectiveness during initial hour after procedure(Ultra-Short Term Relief): 100 %.  Local anesthetic used: Long-acting (4-6 hours) Effectiveness: Defined as any analgesic benefit obtained secondary to the administration of local anesthetics. This carries significant diagnostic value as to the etiological location, or anatomical origin, of the pain. Duration of benefit is expected to coincide with the duration of the local anesthetic used.  Effectiveness during initial 4-6 hours after procedure(Short-Term Relief): 100 %.  Long-term benefit: Defined as any relief past the pharmacologic duration of the local anesthetics.  Effectiveness past the initial 6 hours after procedure(Long-Term Relief): 100 % (pain returned x 2 days ago.  not as bad as prior to procedure.  reports 70% better currently.).  Current benefits: Defined as benefit that persist at this time.   Analgesia:  70% improved Function: Lauren [name] reports improvement in function ROM: Lauren [name] reports improvement in ROM  Pharmacotherapy Assessment  Analgesic: Hydrocodone/APAP 5/325, 1 tab PO q 8 hrs (15 mg/day of hydrocodone) MME/day: 15 mg/day.   Monitoring: [place] PMP: PDMP reviewed during this encounter.       Pharmacotherapy: No side-effects or adverse reactions reported. Compliance: No problems identified. Effectiveness: Clinically acceptable. Plan: Refer to "POC".  UDS:  Summary  Date Value Ref Range Status  06/27/2019 Note  Final    Comment:    ==================================================================== [employer] (MW) ==================================================================== Test                             Result       Flag       Units Drug Present and Declared for Prescription Verification   Hydrocodone  707          EXPECTED   ng/mg creat   Hydromorphone                  70           EXPECTED   ng/mg creat   Dihydrocodeine                 88           EXPECTED   ng/mg creat   Norhydrocodone                 963          EXPECTED   ng/mg creat    Sources of hydrocodone include scheduled prescription medications.    Hydromorphone, dihydrocodeine and norhydrocodone are expected    metabolites of hydrocodone. Hydromorphone and dihydrocodeine are    also available as scheduled prescription medications. Drug Present not Declared for Prescription Verification   Alpha-hydroxymidazolam         88           UNEXPECTED ng/mg creat    Alpha-hydroxymidazolam is an expected metabolite of midazolam.    Source of midazolam is a scheduled prescription medication. ==================================================================== Test                      Result    Flag   Units      Ref Range   Creatinine              268              mg/dL      >=20 ==================================================================== Declared Medications:  The flagging and interpretation on this report are based on the  following declared medications.  Unexpected results may arise from  inaccuracies in the declared medications.  **Note: The testing scope of this panel includes these medications:  Hydrocodone  **Note: The testing scope of this panel does not include the  following reported medications:  Acetaminophen  Albuterol  Aspirin  Beclomethasone  Diltiazem  Esomeprazole  Fluticasone  Furosemide  Hydrochlorothiazide  Ibuprofen  Losartan  Medroxyprogesterone  Metoprolol  Olodaterol  Pregabalin  Tiotropium ==================================================================== For clinical consultation, please call [phone]. ====================================================================     Laboratory Chemistry Profile   Renal Lab Results  Component Value Date   BUN 14  06/27/2019   CREATININE 0.65 06/27/2019   LABCREA 48 09/81/1914   BCR NOT APPLICABLE 78/29/5621   GFRAA >60 06/27/2019   GFRNONAA >60 06/27/2019     Hepatic Lab Results  Component Value Date   AST 14 (L) 06/27/2019   ALT 12 06/27/2019   ALBUMIN 3.9 06/27/2019   ALKPHOS 51 06/27/2019   LIPASE 27 09/05/2017     Electrolytes Lab Results  Component Value Date   NA 140 06/27/2019   K 3.8 06/27/2019   CL 103 06/27/2019   CALCIUM 9.0 06/27/2019   MG 1.8 06/27/2019     Bone Lab Results  Component Value Date   VD25OH 23.11 (L) 06/27/2019   25OHVITD1 32 03/14/2018   25OHVITD2 <1.0 03/14/2018   25OHVITD3 32 03/14/2018     Inflammation (CRP: Acute Phase) (ESR: Chronic Phase) Lab Results  Component Value Date   CRP 2.7 (H) 06/27/2019   ESRSEDRATE 39 (H) 06/27/2019       Note: Above Lab results reviewed.  Imaging  DG PAIN CLINIC C-ARM 1-60 MIN NO REPORT Fluoro was used, but no Radiologist interpretation will be  provided.  Please refer to "NOTES" tab for provider progress note.  Assessment  The primary encounter diagnosis was Chronic pain syndrome. Diagnoses of Chronic knee pain (1ry area of Pain) (Bilateral) (L>R), Osteoarthritis of knees (Bilateral), Neurogenic pain, Fibromyalgia, and Osteoarthritis involving multiple joints were also pertinent to this visit.  Plan of Care  Problem-specific:  No problem-specific Assessment & Plan notes found for this encounter.  Lauren [name] has a current medication list which includes the following long-term medication(s): albuterol, diltiazem, esomeprazole, ferrous sulfate, [START ON 05/09/2020] hydrocodone-acetaminophen, losartan-hydrochlorothiazide, metoprolol succinate, [START ON 06/06/2020] pregabalin, diltiazem, fluticasone, [START ON 06/08/2020] hydrocodone-acetaminophen, and [START ON 07/08/2020] hydrocodone-acetaminophen.  Pharmacotherapy (Medications Ordered): Meds ordered this encounter  Medications  .  HYDROcodone-acetaminophen (NORCO/VICODIN) 5-325 MG tablet    Sig: Take 1 tablet by mouth every 8 (eight) hours as needed for severe pain. Must last 30 days    Dispense:  90 tablet    Refill:  0    Chronic Pain: STOP Act (Not applicable) Fill 1 day early if closed on refill date. Do not fill until: 06/08/2020. To last until: 07/08/2020. Avoid benzodiazepines within 8 hours of opioids  . HYDROcodone-acetaminophen (NORCO/VICODIN) 5-325 MG tablet    Sig: Take 1 tablet by mouth every 8 (eight) hours as needed for severe pain. Must last 30 days    Dispense:  90 tablet    Refill:  0    Chronic Pain: STOP Act (Not applicable) Fill 1 day early if closed on refill date. Do not fill until: 07/08/2020. To last until: 08/07/2020. Avoid benzodiazepines within 8 hours of opioids  . pregabalin (LYRICA) 150 MG capsule    Sig: Take 1 capsule (150 mg total) by mouth 3 (three) times daily.    Dispense:  90 capsule    Refill:  2    Med titration: Prescriptions follow a specific schedule. Avoid dispensing out of order. Fill one day early if pharmacy is closed on scheduled refill date. May substitute for generic if available.  . meloxicam (MOBIC) 15 MG tablet    Sig: Take 1 tablet (15 mg total) by mouth daily.    Dispense:  30 tablet    Refill:  2    Do not add this medication to the electronic "Automatic Refill" notification system. Patient may have prescription filled one day early if pharmacy is closed on scheduled refill date.  . Turmeric 500 MG CAPS    Sig: Take 500 mg by mouth daily.    Dispense:  90 capsule    Refill:  0    OTC Recommendation. Please provide information on: where to find; how to take; side-effects; adverse reactions; drug-to-drug interactions; and contraindications. If unavailable, recommend similar substitute.   Orders:  Orders Placed This Encounter  Procedures  . GENICULAR NERVE BLOCK    Indication(s):  Sub-acute knee pain    Standing Status:   Future    Standing Expiration Date:    07/28/2020    Scheduling Instructions:     Side: Bilateral     Sedation: Patient's choice.     Timeframe: As soon as schedule allows    Order Specific Question:   Where will this procedure be performed?    Answer:   [facility]   Follow-up plan:   Return in 3 months (on 08/05/2020) for (20-min), (F2F), (Med Mgmt), in addition, Procedure (w/ sedation): (B) Genicular NB.  Today the patient indicated that she needs both of her knees treated again.  I have explained to her  that I may be unavailable to do those for at least a month.  She initially indicated that she would wait until I come back, but then once she learned that they would be more than a month, she indicated that she needed to think about it.  If she decides to come in sooner, then she can come in and have the procedure done by Dr. Holley Raring and I will follow up with her.  In a similar manner, she also wanted to repeat the lumbar facet injections.  I told her that we would need to put at least 2 weeks between any steroid injections to avoid adrenal suppression.     Considering: NOTE:NO Lumbar RFAuntil BMIis at or below 35. Diagnostic bilateral cervical facet NB Diagnostic bilateral cervical ESI Possible bilateral cervical facet RFA Diagnostic bilateral suprascapular NB Possible bilateral suprascapular nerve RFA Diagnostic bilateral LESI Possible bilateral lumbar facet RFA(NO Lumbar RFAuntil BMIis at or below 35.)   Palliative PRN treatment(s): Palliative bilateral IA Hyalgan knee injection S1N3(Fluoro required)(1st - 18mo(2nd - 3.9 mo) Palliative bilateral IA knee joint (steroid) #2  Diagnostic/therapeutic bilateral knee genicular NB #2 Diagnostic/therapeutic left IA wrist injection (MNB/TPI) #2 Diagnostic bilateral IA shoulder injection #3 Diagnostic left suprascapular NB #2 Therapeutic left L4-5 LESI #4  Diagnostic right lumbar facet block #2    Recent Visits Date Type Provider Dept  04/14/20  Procedure visit NMilinda Pointer MD [facility]  03/10/20 Office Visit NMilinda Pointer MD [facility]  03/03/20 Procedure visit NMilinda Pointer MD [facility]  Showing recent visits within past 90 days and meeting all other requirements Today's Visits Date Type Provider Dept  04/27/20 Telemedicine NMilinda Pointer MD [facility]  Showing today's visits and meeting all other requirements Future Appointments No visits were found meeting these conditions. Showing future appointments within next 90 days and meeting all other requirements  I discussed the assessment and treatment plan with the patient. The patient was provided an opportunity to ask questions and all were answered. The patient agreed with the plan and demonstrated an understanding of the instructions.  Patient advised to call back or seek an in-person evaluation if the symptoms or condition worsens.  Duration of encounter: 16 minutes.  Note by: FGaspar Cola MD Date: 04/27/2020; Time: 10:05 AM

## 2020-04-27 ENCOUNTER — Other Ambulatory Visit: Payer: Self-pay

## 2020-04-27 ENCOUNTER — Ambulatory Visit: Payer: Medicaid Other | Attending: Pain Medicine | Admitting: Pain Medicine

## 2020-04-27 DIAGNOSIS — M170 Bilateral primary osteoarthritis of knee: Secondary | ICD-10-CM

## 2020-04-27 DIAGNOSIS — M8949 Other hypertrophic osteoarthropathy, multiple sites: Secondary | ICD-10-CM

## 2020-04-27 DIAGNOSIS — M792 Neuralgia and neuritis, unspecified: Secondary | ICD-10-CM | POA: Diagnosis not present

## 2020-04-27 DIAGNOSIS — G8929 Other chronic pain: Secondary | ICD-10-CM

## 2020-04-27 DIAGNOSIS — M797 Fibromyalgia: Secondary | ICD-10-CM

## 2020-04-27 DIAGNOSIS — M25561 Pain in right knee: Secondary | ICD-10-CM | POA: Diagnosis not present

## 2020-04-27 DIAGNOSIS — M159 Polyosteoarthritis, unspecified: Secondary | ICD-10-CM

## 2020-04-27 DIAGNOSIS — G894 Chronic pain syndrome: Secondary | ICD-10-CM | POA: Diagnosis not present

## 2020-04-27 DIAGNOSIS — M25562 Pain in left knee: Secondary | ICD-10-CM

## 2020-04-27 MED ORDER — TURMERIC 500 MG PO CAPS: 500.0000 mg | ORAL_CAPSULE | Freq: Every day | ORAL | 0 refills | Status: AC

## 2020-04-27 MED ORDER — HYDROCODONE-ACETAMINOPHEN 5-325 MG PO TABS: 1.0000 | ORAL_TABLET | Freq: Three times a day (TID) | ORAL | 0 refills | Status: DC | PRN

## 2020-04-27 MED ORDER — PREGABALIN 150 MG PO CAPS: 150.0000 mg | ORAL_CAPSULE | Freq: Three times a day (TID) | ORAL | 2 refills | Status: DC

## 2020-04-27 MED ORDER — MELOXICAM 15 MG PO TABS: 15.0000 mg | ORAL_TABLET | Freq: Every day | ORAL | 2 refills | Status: DC

## 2020-04-27 NOTE — Patient Instructions (Signed)
____________________________________________________________________________________________  Preparing for Procedure with Sedation  Procedure appointments are limited to planned procedures: . No Prescription Refills. . No disability issues will be discussed. . No medication changes will be discussed.  Instructions: . Oral Intake: Do not eat or drink anything for at least 8 hours prior to your procedure. (Exception: Blood Pressure Medication. See below.) . Transportation: Unless otherwise stated by your physician, you may drive yourself after the procedure. . Blood Pressure Medicine: Do not forget to take your blood pressure medicine with a sip of water the morning of the procedure. If your Diastolic (lower reading)is above 100 mmHg, elective cases will be cancelled/rescheduled. . Blood thinners: These will need to be stopped for procedures. Notify our staff if you are taking any blood thinners. Depending on which one you take, there will be specific instructions on how and when to stop it. . Diabetics on insulin: Notify the staff so that you can be scheduled 1st case in the morning. If your diabetes requires high dose insulin, take only  of your normal insulin dose the morning of the procedure and notify the staff that you have done so. . Preventing infections: Shower with an antibacterial soap the morning of your procedure. . Build-up your immune system: Take 1000 mg of Vitamin C with every meal (3 times a day) the day prior to your procedure. . Antibiotics: Inform the staff if you have a condition or reason that requires you to take antibiotics before dental procedures. . Pregnancy: If you are pregnant, call and cancel the procedure. . Sickness: If you have a cold, fever, or any active infections, call and cancel the procedure. . Arrival: You must be in the facility at least 30 minutes prior to your scheduled procedure. . Children: Do not bring children with you. . Dress appropriately:  Bring dark clothing that you would not mind if they get stained. . Valuables: Do not bring any jewelry or valuables.  Reasons to call and reschedule or cancel your procedure: (Following these recommendations will minimize the risk of a serious complication.) . Surgeries: Avoid having procedures within 2 weeks of any surgery. (Avoid for 2 weeks before or after any surgery). . Flu Shots: Avoid having procedures within 2 weeks of a flu shots or . (Avoid for 2 weeks before or after immunizations). . Barium: Avoid having a procedure within 7-10 days after having had a radiological study involving the use of radiological contrast. (Myelograms, Barium swallow or enema study). . Heart attacks: Avoid any elective procedures or surgeries for the initial 6 months after a "Myocardial Infarction" (Heart Attack). . Blood thinners: It is imperative that you stop these medications before procedures. Let us know if you if you take any blood thinner.  . Infection: Avoid procedures during or within two weeks of an infection (including chest colds or gastrointestinal problems). Symptoms associated with infections include: Localized redness, fever, chills, night sweats or profuse sweating, burning sensation when voiding, cough, congestion, stuffiness, runny nose, sore throat, diarrhea, nausea, vomiting, cold or Flu symptoms, recent or current infections. It is specially important if the infection is over the area that we intend to treat. . Heart and lung problems: Symptoms that may suggest an active cardiopulmonary problem include: cough, chest pain, breathing difficulties or shortness of breath, dizziness, ankle swelling, uncontrolled high or unusually low blood pressure, and/or palpitations. If you are experiencing any of these symptoms, cancel your procedure and contact your primary care physician for an evaluation.  Remember:  Regular Business hours are:    Monday to Thursday 8:00 AM to 4:00 PM  Provider's  Schedule: Nyliah Nierenberg, MD:  Procedure days: Tuesday and Thursday 7:30 AM to 4:00 PM  Bilal Lateef, MD:  Procedure days: Monday and Wednesday 7:30 AM to 4:00 PM ____________________________________________________________________________________________    

## 2020-05-04 NOTE — Progress Notes (Deleted)
Rescheduled due to cough and fever

## 2020-05-05 ENCOUNTER — Ambulatory Visit: Payer: Medicaid Other | Attending: Pain Medicine | Admitting: Pain Medicine

## 2020-05-20 NOTE — Patient Instructions (Signed)

## 2020-05-20 NOTE — Progress Notes (Addendum)
PROVIDER NOTE: Information contained herein reflects review and annotations entered in association with encounter. Interpretation of such information and data should be left to medically-trained personnel. Information provided to patient can be located elsewhere in the medical record under "Patient Instructions". Document created using STT-dictation technology, any transcriptional errors that may result from process are unintentional.    Patient: [name]  Service Category: Procedure  Provider: Gaspar Cola, MD  DOB: 1976-02-06  DOS: 05/21/2020  Location: [facility]  MRN: [id number]  Setting: Ambulatory - outpatient  Referring Provider: Lorelee Market, MD  Type: Established Patient  Specialty: Interventional Pain Management  PCP: Lorelee Market, MD   Primary Reason for Visit: Interventional Pain Management Treatment. CC: Knee Pain (bilateral) and Back Pain (low)  Procedure:          Anesthesia, Analgesia, Anxiolysis:  Type: Genicular Nerves Block (Superolateral, Superomedial, and Inferomedial Genicular Nerves) #2  CPT: 40981      Primary Purpose: Diagnostic Region: Lateral, Anterior, and Medial aspects of the knee joint, above and below the knee joint proper. Level: Superior and inferior to the knee joint. Target Area: For Genicular Nerve block(s), the targets are: the superolateral genicular nerve, located in the lateral distal portion of the femoral shaft as it curves to form the lateral epicondyle, in the region of the distal femoral metaphysis; the superomedial genicular nerve, located in the medial distal portion of the femoral shaft as it curves to form the medial epicondyle; and the inferomedial genicular nerve, located in the medial, proximal portion of the tibial shaft, as it curves to form the medial epicondyle, in the region of the proximal tibial metaphysis. Approach: Anterior, percutaneous, ipsilateral approach. Laterality: Bilateral  Type: Moderate  (Conscious) Sedation combined with Local Anesthesia Indication(s): Analgesia and Anxiety Route: Intravenous (IV) IV Access: Secured Sedation: Meaningful verbal contact was maintained at all times during the procedure  Local Anesthetic: Lidocaine 1-2%  Position: Modified Fowler's position with pillows under the targeted knee(s).   Indications: 1. Chronic knee pain (1ry area of Pain) (Bilateral) (L>R)   2. Osteoarthritis of knees (Bilateral)   3. Morbid obesity with body mass index (BMI) of 45.0 to 49.9 in adult [facility])    Pain Score: Pre-procedure: 8 /10 Post-procedure: 0-No pain/10   Pre-op Assessment:  Lauren [name] is a 44 y.o. (year old), female patient, seen today for interventional treatment. She  has a past surgical history that includes Rotator cuff repair; Knee surgery; Cholecystectomy; Cesarean section (2010); Cesarean section (2011); Tonsillectomy; Cardiac electrophysiology study and ablation (01/18/2018); and Excision mass neck (Left, 10/11/2018). Lauren [name] has a current medication list which includes the following prescription(s): albuterol, aspirin ec, beclomethasone, diltiazem, esomeprazole, ferrous sulfate, fluticasone, hydrocodone-acetaminophen, [START ON 06/08/2020] hydrocodone-acetaminophen, [START ON 07/08/2020] hydrocodone-acetaminophen, medroxyprogesterone acetate, [START ON 06/08/2020] meloxicam, metoprolol succinate, fish oil, [START ON 06/06/2020] pregabalin, stiolto respimat, and [START ON 06/08/2020] turmeric, and the following Facility-Administered Medications: midazolam. Her primarily concern today is the Knee Pain (bilateral) and Back Pain (low)  Initial Vital Signs:  Pulse/HCG Rate: 83ECG Heart Rate: 89 Temp: (!) 97.4 F (36.3 C) Resp: 18 BP: (!) 163/98 (MD notified) SpO2: 99 %  BMI: Estimated body mass index is 45.49 kg/m as calculated from the following:   Height as of this encounter: 5\' 4"  (1.626 m).   Weight as of this encounter: 265 lb (120.2  kg).  Risk Assessment: Allergies: Reviewed. She is allergic to nucynta [tapentadol] and tape.  Allergy Precautions: None required Coagulopathies: Reviewed. None identified.  Blood-thinner  therapy: None at this time Active Infection(s): Reviewed. None identified. Lauren [name] is afebrile  Site Confirmation: Lauren [name] was asked to confirm the procedure and laterality before marking the site Procedure checklist: Completed Consent: Before the procedure and under the influence of no sedative(s), amnesic(s), or anxiolytics, the patient was informed of the treatment options, risks and possible complications. To fulfill our ethical and legal obligations, as recommended by the American Medical Association's Code of Ethics, I have informed the patient of my clinical impression; the nature and purpose of the treatment or procedure; the risks, benefits, and possible complications of the intervention; the alternatives, including doing nothing; the risk(s) and benefit(s) of the alternative treatment(s) or procedure(s); and the risk(s) and benefit(s) of doing nothing. The patient was provided information about the general risks and possible complications associated with the procedure. These may include, but are not limited to: failure to achieve desired goals, infection, bleeding, organ or nerve damage, allergic reactions, paralysis, and death. In addition, the patient was informed of those risks and complications associated to the procedure, such as failure to decrease pain; infection; bleeding; organ or nerve damage with subsequent damage to sensory, motor, and/or autonomic systems, resulting in permanent pain, numbness, and/or weakness of one or several areas of the body; allergic reactions; (i.e.: anaphylactic reaction); and/or death. Furthermore, the patient was informed of those risks and complications associated with the medications. These include, but are not limited to: allergic reactions (i.e.:  anaphylactic or anaphylactoid reaction(s)); adrenal axis suppression; blood sugar elevation that in diabetics may result in ketoacidosis or comma; water retention that in patients with history of congestive heart failure may result in shortness of breath, pulmonary edema, and decompensation with resultant heart failure; weight gain; swelling or edema; medication-induced neural toxicity; particulate matter embolism and blood vessel occlusion with resultant organ, and/or nervous system infarction; and/or aseptic necrosis of one or more joints. Finally, the patient was informed that Medicine is not an exact science; therefore, there is also the possibility of unforeseen or unpredictable risks and/or possible complications that may result in a catastrophic outcome. The patient indicated having understood very clearly. We have given the patient no guarantees and we have made no promises. Enough time was given to the patient to ask questions, all of which were answered to the patient's satisfaction. Lauren [name] has indicated that she wanted to continue with the procedure. Attestation: I, the ordering provider, attest that I have discussed with the patient the benefits, risks, side-effects, alternatives, likelihood of achieving goals, and potential problems during recovery for the procedure that I have provided informed consent. Date  Time: 05/21/2020  7:51 AM  Pre-Procedure Preparation:  Monitoring: As per clinic protocol. Respiration, ETCO2, SpO2, BP, heart rate and rhythm monitor placed and checked for adequate function Safety Precautions: Patient was assessed for positional comfort and pressure points before starting the procedure. Time-out: I initiated and conducted the "Time-out" before starting the procedure, as per protocol. The patient was asked to participate by confirming the accuracy of the "Time Out" information. Verification of the correct person, site, and procedure were performed and confirmed by  me, the nursing staff, and the patient. "Time-out" conducted as per Joint Commission's Universal Protocol (UP.01.01.01). Time: 10  Description of Procedure:          Area Prepped: Entire knee area, from mid-thigh to mid-shin, lateral, anterior, and medial aspects. DuraPrep (Iodine Povacrylex [0.7% available iodine] and Isopropyl Alcohol, 74% w/w) Safety Precautions: Aspiration looking for blood return was conducted prior to  all injections. At no point did we inject any substances, as a needle was being advanced. No attempts were made at seeking any paresthesias. Safe injection practices and needle disposal techniques used. Medications properly checked for expiration dates. SDV (single dose vial) medications used. Description of the Procedure: Protocol guidelines were followed. The patient was placed in position over the procedure table. The target area was identified and the area prepped in the usual manner. Skin & deeper tissues infiltrated with local anesthetic. Appropriate amount of time allowed to pass for local anesthetics to take effect. The procedure needles were then advanced to the target area. Proper needle placement secured. Negative aspiration confirmed. Solution injected in intermittent fashion, asking for systemic symptoms every 0.5cc of injectate. The needles were then removed and the area cleansed, making sure to leave some of the prepping solution back to take advantage of its long term bactericidal properties.  Vitals:   05/21/20 0900 05/21/20 0904 05/21/20 0914 05/21/20 0924  BP: 99/72 114/62 125/76 121/71  Pulse: 86     Resp: 16 15 (!) 23 (!) 23  Temp:  98.6 F (37 C)  98.7 F (37.1 C)  TempSrc:  Temporal  Temporal  SpO2: 100% 99% 100% 100%  Weight:      Height:        Start Time: 0848 hrs. End Time: 0858 hrs. Materials:  Needle(s) Type: Spinal Needle Gauge: 22G Length: 5-in Medication(s): Please see orders for medications and dosing details.  Imaging Guidance  (Non-Spinal):          Type of Imaging Technique: Fluoroscopy Guidance (Non-Spinal) Indication(s): Assistance in needle guidance and placement for procedures requiring needle placement in or near specific anatomical locations not easily accessible without such assistance. Exposure Time: Please see nurses notes. Contrast: None used. Fluoroscopic Guidance: I was personally present during the use of fluoroscopy. "Tunnel Vision Technique" used to obtain the best possible view of the target area. Parallax error corrected before commencing the procedure. "Direction-depth-direction" technique used to introduce the needle under continuous pulsed fluoroscopy. Once target was reached, antero-posterior, oblique, and lateral fluoroscopic projection used confirm needle placement in all planes. Images permanently stored in EMR. Interpretation: No contrast injected. I personally interpreted the imaging intraoperatively. Adequate needle placement confirmed in multiple planes. Permanent images saved into the patient's record.  Antibiotic Prophylaxis:   Anti-infectives (From admission, onward)   None     Indication(s): None identified  Post-operative Assessment:  Post-procedure Vital Signs:  Pulse/HCG Rate: 8686 Temp: 98.7 F (37.1 C) Resp: (!) 23 BP: 121/71 SpO2: 100 %  EBL: None  Complications: No immediate post-treatment complications observed by team, or reported by patient.  Note: The patient tolerated the entire procedure well. A repeat set of vitals were taken after the procedure and the patient was kept under observation following institutional policy, for this type of procedure. Post-procedural neurological assessment was performed, showing return to baseline, prior to discharge. The patient was provided with post-procedure discharge instructions, including a section on how to identify potential problems. Should any problems arise concerning this procedure, the patient was given instructions to  immediately contact us, at any time, without hesitation. In any case, we plan to contact the patient by telephone for a follow-up status report regarding this interventional procedure.  Comments:  No additional relevant information.  Plan of Care  Orders:  Orders Placed This Encounter  Procedures  . GENICULAR NERVE BLOCK    Scheduling Instructions:     Side(s): Bilateral Knee  Level(s): Superior-Lateral, Superior-Medial, and Inferior-Medial Genicular Nerves     Sedation: Patient's choice.     Timeframe: Today    Order Specific Question:   Where will this procedure be performed?    Answer:   [facility]  . DG [facility] C-ARM 1-60 MIN NO REPORT    Intraoperative interpretation by procedural physician at [place].    Standing Status:   Standing    Number of Occurrences:   1    Order Specific Question:   Reason for exam:    Answer:   Assistance in needle guidance and placement for procedures requiring needle placement in or near specific anatomical locations not easily accessible without such assistance.  . Informed Consent Details: Physician/Practitioner Attestation; Transcribe to consent form and obtain patient signature    Provider Attestation: I, [place] Dossie Arbour, MD, (Pain Management Specialist), the physician/practitioner, attest that I have discussed with the patient the benefits, risks, side effects, alternatives, likelihood of achieving goals and potential problems during recovery for the procedure that I have provided informed consent.    Scheduling Instructions:     Procedure: Block of the genicular nerves of the knee (Genicular Nerves Block)     Indications: Chronic knee pain     Note: Always confirm laterality of pain with Lauren [name], before procedure.     Transcribe to consent form and obtain patient signature.  . Provide equipment / supplies at bedside    Equipment required: Single use, disposable, "Block Tray"    Standing Status:    Standing    Number of Occurrences:   1    Order Specific Question:   Specify    Answer:   Block Tray   Chronic Opioid Analgesic:  Hydrocodone/APAP 5/325, 1 tab PO q 8 hrs (15 mg/day of hydrocodone) MME/day: 15 mg/day.   Medications ordered for procedure: Meds ordered this encounter  Medications  . lidocaine (XYLOCAINE) 2 % (with pres) injection 400 mg  . lactated ringers infusion 1,000 mL  . midazolam (VERSED) 5 MG/5ML injection 1-2 mg    Make sure Flumazenil is available in the pyxis when using this medication. If oversedation occurs, administer 0.2 mg IV over 15 sec. If after 45 sec no response, administer 0.2 mg again over 1 min; may repeat at 1 min intervals; not to exceed 4 doses (1 mg)  . fentaNYL (SUBLIMAZE) injection 25-50 mcg    Make sure Narcan is available in the pyxis when using this medication. In the event of respiratory depression (RR< 8/min): Titrate NARCAN (naloxone) in increments of 0.1 to 0.2 mg IV at 2-3 minute intervals, until desired degree of reversal.  . methylPREDNISolone acetate (DEPO-MEDROL) injection 80 mg  . ropivacaine (PF) 2 mg/mL (0.2%) (NAROPIN) injection 18 mL   Medications administered: We administered lidocaine, lactated ringers, midazolam, fentaNYL, methylPREDNISolone acetate, and ropivacaine (PF) 2 mg/mL (0.2%).  See the medical record for exact dosing, route, and time of administration.  Follow-up plan:   Return in about 2 weeks (around 06/04/2020) for (VV), (PP) Follow-up.       Considering: NOTE:NO Lumbar RFAuntil BMIis at or below 35. Diagnostic bilateral cervical facet NB Diagnostic bilateral cervical ESI Possible bilateral cervical facet RFA Diagnostic bilateral suprascapular NB Possible bilateral suprascapular nerve RFA Diagnostic bilateral LESI Possible bilateral lumbar facet RFA(NO Lumbar RFAuntil BMIis at or below 35.)   Palliative PRN treatment(s): Palliative bilateral IA Hyalgan knee injection S1N3(Fluoro  required)(1st - 56mo)(2nd - 3.9 mo) Palliative bilateral IA knee joint (steroid) #2  Diagnostic/therapeutic bilateral knee genicular NB #2 Diagnostic/therapeutic left IA wrist injection (MNB/TPI) #2 Diagnostic bilateral IA shoulder injection #3 Diagnostic left suprascapular NB #2 Therapeutic left L4-5 LESI #4  Diagnostic right lumbar facet block #2     Recent Visits Date Type Provider Dept  04/27/20 Telemedicine Milinda Pointer, MD [facility]  04/14/20 Procedure visit Milinda Pointer, MD [facility]  03/10/20 Office Visit Milinda Pointer, MD [facility]  03/03/20 Procedure visit Milinda Pointer, MD [facility]  Showing recent visits within past 90 days and meeting all other requirements Today's Visits Date Type Provider Dept  05/21/20 Procedure visit Milinda Pointer, MD [facility]  Showing today's visits and meeting all other requirements Future Appointments Date Type Provider Dept  06/15/20 Appointment Milinda Pointer, MD [facility]  07/20/20 Appointment Milinda Pointer, MD [facility]  Showing future appointments within next 90 days and meeting all other requirements  Disposition: Discharge home  Discharge (Date  Time): 05/21/2020; 0926 hrs.   Primary Care Physician: Lorelee Market, MD Location: [facility] Note by: Gaspar Cola, MD Date: 05/21/2020; Time: 9:56 AM  Disclaimer:  Medicine is not an Chief Strategy Officer. The only guarantee in medicine is that nothing is guaranteed. It is important to note that the decision to proceed with this intervention was based on the information collected from the patient. The Data and conclusions were drawn from the patient's questionnaire, the interview, and the physical examination. Because the information was provided in large part by the patient, it cannot be guaranteed that it has not been purposely or  unconsciously manipulated. Every effort has been made to obtain as much relevant data as possible for this evaluation. It is important to note that the conclusions that lead to this procedure are derived in large part from the available data. Always take into account that the treatment will also be dependent on availability of resources and existing treatment guidelines, considered by other Pain Management Practitioners as being common knowledge and practice, at the time of the intervention. For Medico-Legal purposes, it is also important to point out that variation in procedural techniques and pharmacological choices are the acceptable norm. The indications, contraindications, technique, and results of the above procedure should only be interpreted and judged by a Board-Certified Interventional Pain Specialist with extensive familiarity and expertise in the same exact procedure and technique.

## 2020-05-21 ENCOUNTER — Ambulatory Visit
Admission: RE | Admit: 2020-05-21 | Discharge: 2020-05-21 | Disposition: A | Discharge status: Home / Self Care | Payer: Medicaid Other | Source: Ambulatory Visit | Attending: Pain Medicine | Admitting: Pain Medicine

## 2020-05-21 ENCOUNTER — Ambulatory Visit (HOSPITAL_BASED_OUTPATIENT_CLINIC_OR_DEPARTMENT_OTHER): Payer: Medicaid Other | Admitting: Pain Medicine

## 2020-05-21 ENCOUNTER — Encounter: Payer: Self-pay | Admitting: Pain Medicine

## 2020-05-21 ENCOUNTER — Other Ambulatory Visit: Payer: Self-pay

## 2020-05-21 VITALS — BP 121/71 | HR 86 | Temp 98.7°F | Resp 23 | Ht 64.0 in | Wt 265.0 lb

## 2020-05-21 DIAGNOSIS — Z6841 Body Mass Index (BMI) 40.0 and over, adult: Secondary | ICD-10-CM | POA: Insufficient documentation

## 2020-05-21 DIAGNOSIS — G8929 Other chronic pain: Secondary | ICD-10-CM | POA: Insufficient documentation

## 2020-05-21 DIAGNOSIS — M25562 Pain in left knee: Secondary | ICD-10-CM | POA: Insufficient documentation

## 2020-05-21 DIAGNOSIS — M25561 Pain in right knee: Secondary | ICD-10-CM | POA: Insufficient documentation

## 2020-05-21 DIAGNOSIS — M170 Bilateral primary osteoarthritis of knee: Secondary | ICD-10-CM | POA: Insufficient documentation

## 2020-05-21 MED ORDER — ROPIVACAINE HCL 2 MG/ML IJ SOLN
INTRAMUSCULAR | Status: AC
  Filled 2020-05-21: qty 20

## 2020-05-21 MED ORDER — MIDAZOLAM HCL 5 MG/5ML IJ SOLN
INTRAMUSCULAR | Status: AC
  Filled 2020-05-21: qty 5

## 2020-05-21 MED ORDER — ROPIVACAINE HCL 2 MG/ML IJ SOLN
18.0000 mL | Freq: Once | INTRAMUSCULAR | Status: AC
  Administered 2020-05-21: 10 mL via PERINEURAL

## 2020-05-21 MED ORDER — LACTATED RINGERS IV SOLN
1000.0000 mL | Freq: Once | INTRAVENOUS | Status: AC
  Administered 2020-05-21: 1000 mL via INTRAVENOUS

## 2020-05-21 MED ORDER — LIDOCAINE HCL 2 % IJ SOLN
INTRAMUSCULAR | Status: AC
  Filled 2020-05-21: qty 10

## 2020-05-21 MED ORDER — FENTANYL CITRATE (PF) 100 MCG/2ML IJ SOLN
INTRAMUSCULAR | Status: AC
  Filled 2020-05-21: qty 2

## 2020-05-21 MED ORDER — MIDAZOLAM HCL 5 MG/5ML IJ SOLN
1.0000 mg | INTRAMUSCULAR | Status: DC | PRN
  Administered 2020-05-21: 2 mg via INTRAVENOUS
  Administered 2020-05-21: 1 mg via INTRAVENOUS

## 2020-05-21 MED ORDER — METHYLPREDNISOLONE ACETATE 80 MG/ML IJ SUSP
INTRAMUSCULAR | Status: AC
  Filled 2020-05-21: qty 1

## 2020-05-21 MED ORDER — FENTANYL CITRATE (PF) 100 MCG/2ML IJ SOLN
25.0000 ug | INTRAMUSCULAR | Status: AC | PRN
  Administered 2020-05-21 (×2): 50 ug via INTRAVENOUS

## 2020-05-21 MED ORDER — METHYLPREDNISOLONE ACETATE 80 MG/ML IJ SUSP
80.0000 mg | Freq: Once | INTRAMUSCULAR | Status: AC
  Administered 2020-05-21: 80 mg

## 2020-05-21 MED ORDER — LIDOCAINE HCL 2 % IJ SOLN
20.0000 mL | Freq: Once | INTRAMUSCULAR | Status: AC
  Administered 2020-05-21: 400 mg

## 2020-05-21 NOTE — Progress Notes (Signed)
Safety precautions to be maintained throughout the outpatient stay will include: orient to surroundings, keep bed in low position, maintain call bell within reach at all times, provide assistance with transfer out of bed and ambulation.  

## 2020-05-22 ENCOUNTER — Telehealth: Payer: Self-pay

## 2020-05-22 NOTE — Telephone Encounter (Signed)
Post procedure follow up call.  Patient states she is doing well.

## 2020-06-10 ENCOUNTER — Encounter: Payer: Self-pay | Admitting: Pain Medicine

## 2020-06-14 NOTE — Progress Notes (Signed)
Patient: Lauren [name]  Service Category: E/M  Provider: Gaspar Cola, MD  DOB: December 15, 1975  DOS: 06/15/2020  Location: Office  MRN: [id number]  Setting: Ambulatory outpatient  Referring Provider: Lorelee Market, MD  Type: Established Patient  Specialty: Interventional Pain Management  PCP: Lorelee Market, MD  Location: Remote location  Delivery: TeleHealth     Virtual Encounter - Pain Management PROVIDER NOTE: Information contained herein reflects review and annotations entered in association with encounter. Interpretation of such information and data should be left to medically-trained personnel. Information provided to patient can be located elsewhere in the medical record under "Patient Instructions". Document created using STT-dictation technology, any transcriptional errors that may result from process are unintentional.    Contact & Pharmacy Preferred: [phone] Home: [phone] (home) Mobile: [phone] (mobile) E-mail: [email or web address]  [facility] [address] ([address]), [place] ([place]) [place] [address] Phone: [phone] Fax: [phone]   Pre-screening  Lauren [name] offered "in-person" vs "virtual" encounter. She indicated preferring virtual for this encounter.   Reason COVID-19*  Social distancing based on CDC and AMA recommendations.   I contacted Lauren [name] on 06/15/2020 via telephone.      I clearly identified myself as Gaspar Cola, MD. I verified that I was speaking with the correct person using two identifiers (Name: Terriah Reggio Thuman, and date of birth: 03/26/1976).  Consent I sought verbal advanced consent from [place] for virtual visit interactions. I informed Lauren [name] of possible security and privacy concerns, risks, and limitations associated with providing "not-in-person" medical evaluation and management services. I also informed Lauren [name] of the  availability of "in-person" appointments. Finally, I informed her that there would be a charge for the virtual visit and that she could be  personally, fully or partially, financially responsible for it. Lauren [name] expressed understanding and agreed to proceed.   Historic Elements   Lauren [name] is a 44 y.o. year old, female patient evaluated today after our last contact on 05/21/2020. Lauren [name]  has a past medical history of Acute shoulder pain (Left) (12/26/2018), Advanced maternal age in multigravida, Asthma, Depression, GERD (gastroesophageal reflux disease), Grand multiparity, Headache, History of carpal tunnel surgery of left wrist (05/2018), Hypertension, Leg fracture, Obesity, morbid, BMI 40.0-49.9 ([place]), Post traumatic stress disorder, Pre-diabetes, Pre-eclampsia, SVT (supraventricular tachycardia) ([place]), and Wrist fracture, closed. She also  has a past surgical history that includes Rotator cuff repair; Knee surgery; Cholecystectomy; Cesarean section (2010); Cesarean section (2011); Tonsillectomy; Cardiac electrophysiology study and ablation (01/18/2018); and Excision mass neck (Left, 10/11/2018). Lauren [name] has a current medication list which includes the following prescription(s): albuterol, aspirin ec, beclomethasone, diltiazem, esomeprazole, ferrous sulfate, hydrocodone-acetaminophen, [START ON 07/08/2020] hydrocodone-acetaminophen, medroxyprogesterone acetate, meloxicam, metoprolol succinate, fish oil, pregabalin, stiolto respimat, turmeric, fluticasone, and hydrocodone-acetaminophen. She  reports that she has never smoked. She has never used smokeless tobacco. She reports that she does not drink alcohol and does not use drugs. Lauren [name] is allergic to nucynta [tapentadol] and tape.   HPI  Today, she is being contacted for a post-procedure assessment.  According to the patient she had 100% relief of the pain that lasted approximately 1 to 2 weeks after which the pain then  started coming back and currently she is about 70% improved from where she was before.  She indicates that it was just recently when she started having pain again with climbing stairs.  This pain is beginning to be more frequent  now suggesting that the effects of the steroids are wearing off.   Medical Necessity: Lauren [name] has experienced debilitating chronic pain from the  bilateral knee osteoarthritis  that has persisted for longer than three months of failed non-surgical care and has either failed to respond, or was unable to tolerate, or simply did not get enough benefit from other more conservative therapies including, but not limited to: 1. Over-the-counter oral analgesic medications (i.e.: ibuprofen, naproxen, etc.) 2. Anti-inflammatory medications 3. Muscle relaxants 4. Membrane stabilizers 5. Opioids 6. Physical therapy (PT), chiropractic manipulation, and/or home exercise program (HEP). 7. Modalities (Heat, ice, etc.) 8. Invasive techniques such as nerve blocks and joint injections.  Lauren [name] has attained greater than 50% reduction in pain from at least two (2) diagnostic medial branch blocks conducted in separate occasions. For this reason, I believe it is medically necessary to proceed with  Radiofrequency ablation of the genicular nerves  for the purpose of attempting to prolong the duration of the benefits seen with the diagnostic injections.  Post-Procedure Evaluation  Procedure (05/21/2020): Diagnostic bilateral genicular nerve block #2 under fluoroscopic guidance and IV sedation Pre-procedure pain level: 8/10 Post-procedure: 0/10 (100% relief)  Sedation: Sedation provided.  Effectiveness during initial hour after procedure(Ultra-Short Term Relief): 100 %.  Local anesthetic used: Long-acting (4-6 hours) Effectiveness: Defined as any analgesic benefit obtained secondary to the administration of local anesthetics. This carries significant diagnostic value as to the  etiological location, or anatomical origin, of the pain. Duration of benefit is expected to coincide with the duration of the local anesthetic used.  Effectiveness during initial 4-6 hours after procedure(Short-Term Relief): 100 %.  Long-term benefit: Defined as any relief past the pharmacologic duration of the local anesthetics.  Effectiveness past the initial 6 hours after procedure(Long-Term Relief): 70 % (pain has started coming back).  Current benefits: Defined as benefit that persist at this time.   Analgesia:  >50% relief Function: Lauren [name] reports improvement in function ROM: Lauren [name] reports improvement in ROM  Pharmacotherapy Assessment  Analgesic: Hydrocodone/APAP 5/325, 1 tab PO q 8 hrs (15 mg/day of hydrocodone) MME/day: 15 mg/day.   Monitoring: [place] PMP: PDMP reviewed during this encounter.       Pharmacotherapy: No side-effects or adverse reactions reported. Compliance: No problems identified. Effectiveness: Clinically acceptable. Plan: Refer to "POC".  UDS:  Summary  Date Value Ref Range Status  06/27/2019 Note  Final    Comment:    ==================================================================== [employer] 13 (MW) ==================================================================== Test                             Result       Flag       Units Drug Present and Declared for Prescription Verification   Hydrocodone                    707          EXPECTED   ng/mg creat   Hydromorphone                  70           EXPECTED   ng/mg creat   Dihydrocodeine                 88           EXPECTED   ng/mg creat   Norhydrocodone  963          EXPECTED   ng/mg creat    Sources of hydrocodone include scheduled prescription medications.    Hydromorphone, dihydrocodeine and norhydrocodone are expected    metabolites of hydrocodone. Hydromorphone and dihydrocodeine are    also available as scheduled prescription medications. Drug Present not  Declared for Prescription Verification   Alpha-hydroxymidazolam         88           UNEXPECTED ng/mg creat    Alpha-hydroxymidazolam is an expected metabolite of midazolam.    Source of midazolam is a scheduled prescription medication. ==================================================================== Test                      Result    Flag   Units      Ref Range   Creatinine              268              mg/dL      >=20 ==================================================================== Declared Medications:  The flagging and interpretation on this report are based on the  following declared medications.  Unexpected results may arise from  inaccuracies in the declared medications.  **Note: The testing scope of this panel includes these medications:  Hydrocodone  **Note: The testing scope of this panel does not include the  following reported medications:  Acetaminophen  Albuterol  Aspirin  Beclomethasone  Diltiazem  Esomeprazole  Fluticasone  Furosemide  Hydrochlorothiazide  Ibuprofen  Losartan  Medroxyprogesterone  Metoprolol  Olodaterol  Pregabalin  Tiotropium ==================================================================== For clinical consultation, please call [phone]. ====================================================================     Laboratory Chemistry Profile   Renal Lab Results  Component Value Date   BUN 14 06/27/2019   CREATININE 0.65 06/27/2019   LABCREA 48 62/94/7654   BCR NOT APPLICABLE 65/11/5463   GFRAA >60 06/27/2019   GFRNONAA >60 06/27/2019     Hepatic Lab Results  Component Value Date   AST 14 (L) 06/27/2019   ALT 12 06/27/2019   ALBUMIN 3.9 06/27/2019   ALKPHOS 51 06/27/2019   LIPASE 27 09/05/2017     Electrolytes Lab Results  Component Value Date   NA 140 06/27/2019   K 3.8 06/27/2019   CL 103 06/27/2019   CALCIUM 9.0 06/27/2019   MG 1.8 06/27/2019     Bone Lab Results  Component Value Date   VD25OH  23.11 (L) 06/27/2019   25OHVITD1 32 03/14/2018   25OHVITD2 <1.0 03/14/2018   25OHVITD3 32 03/14/2018     Inflammation (CRP: Acute Phase) (ESR: Chronic Phase) Lab Results  Component Value Date   CRP 2.7 (H) 06/27/2019   ESRSEDRATE 39 (H) 06/27/2019       Note: Above Lab results reviewed.  Imaging  DG PAIN CLINIC C-ARM 1-60 MIN NO REPORT Fluoro was used, but no Radiologist interpretation will be provided.  Please refer to "NOTES" tab for provider progress note.  Assessment  The primary encounter diagnosis was Chronic low back pain (5th area of Pain) (Bilateral) (R>L) w/o sciatica. Diagnoses of Chronic musculoskeletal pain, Chronic knee pain (1ry area of Pain) (Bilateral) (L>R), and Osteoarthritis of knees (Bilateral) were also pertinent to this visit.  Plan of Care  Problem-specific:  No problem-specific Assessment & Plan notes found for this encounter.  Lauren [name] has a current medication list which includes the following long-term medication(s): albuterol, diltiazem, esomeprazole, ferrous sulfate, hydrocodone-acetaminophen, [START ON 07/08/2020] hydrocodone-acetaminophen, metoprolol succinate, pregabalin, fluticasone, and  hydrocodone-acetaminophen.  Pharmacotherapy (Medications Ordered): No orders of the defined types were placed in this encounter.  Orders:  Orders Placed This Encounter  Procedures  . Radiofrequency,Genicular    Standing Status:   Future    Standing Expiration Date:   08/15/2020    Scheduling Instructions:     Side(s): Left Knee     Level(s): Superior-Lateral, Superior-Medial, and Inferior-Medial Genicular Nerve(s)     Sedation: With Sedation.     Scheduling Timeframe: As soon as pre-approved    Order Specific Question:   Where will this procedure be performed?    Answer:   [facility]  . PT TENS evaluation    Scheduling Instructions:     Please set this patient up with a TENS unit to be used for acute and subacute flareups of herpain    Follow-up plan:   Return for Radio-Frequency: (L) Knee Genicular RFA #1.      Considering: NOTE:NO Lumbar RFAuntil BMIis at or below 35. Diagnostic bilateral cervical facet NB Diagnostic bilateral cervical ESI Possible bilateral cervical facet RFA Diagnostic bilateral suprascapular NB Possible bilateral suprascapular nerve RFA Diagnostic bilateral LESI Possible bilateral lumbar facet RFA(NO Lumbar RFAuntil BMIis at or below 35.)   Palliative PRN treatment(s): Palliative bilateral IA Hyalgan knee injection S1N3(Fluoro required)(1st - 43mo(2nd - 3.9 mo) Palliative bilateral IA knee joint (steroid) #2  Diagnostic/therapeutic bilateral knee genicular NB #3 Diagnostic/therapeutic left IA wrist injection (MNB/TPI) #2 Diagnostic bilateral IA shoulder injection #3 Diagnostic left suprascapular NB #2 Therapeutic left L4-5 LESI #4  Diagnostic right lumbar facet block #2    Recent Visits Date Type Provider Dept  05/21/20 Procedure visit NMilinda Pointer MD [facility]  04/27/20 Telemedicine NMilinda Pointer MD [facility]  04/14/20 Procedure visit NMilinda Pointer MD [facility]  Showing recent visits within past 90 days and meeting all other requirements Today's Visits Date Type Provider Dept  06/15/20 Telemedicine NMilinda Pointer MD [facility]  Showing today's visits and meeting all other requirements Future Appointments Date Type Provider Dept  07/20/20 Appointment NMilinda Pointer MD [facility]  Showing future appointments within next 90 days and meeting all other requirements  I discussed the assessment and treatment plan with the patient. The patient was provided an opportunity to ask questions and all were answered. The patient agreed with the plan and demonstrated an understanding of the instructions.  Patient advised to call back or seek an in-person evaluation if the symptoms or  condition worsens.  Duration of encounter: 12 minutes.  Note by: FGaspar Cola MD Date: 06/15/2020; Time: 3:11 PM

## 2020-06-15 ENCOUNTER — Ambulatory Visit: Payer: Medicaid Other | Attending: Pain Medicine | Admitting: Pain Medicine

## 2020-06-15 ENCOUNTER — Other Ambulatory Visit: Payer: Self-pay

## 2020-06-15 DIAGNOSIS — M545 Low back pain, unspecified: Secondary | ICD-10-CM | POA: Diagnosis not present

## 2020-06-15 DIAGNOSIS — M170 Bilateral primary osteoarthritis of knee: Secondary | ICD-10-CM | POA: Diagnosis not present

## 2020-06-15 DIAGNOSIS — M25561 Pain in right knee: Secondary | ICD-10-CM | POA: Diagnosis not present

## 2020-06-15 DIAGNOSIS — M7918 Myalgia, other site: Secondary | ICD-10-CM | POA: Diagnosis not present

## 2020-06-15 DIAGNOSIS — G8929 Other chronic pain: Secondary | ICD-10-CM

## 2020-06-15 DIAGNOSIS — M25562 Pain in left knee: Secondary | ICD-10-CM

## 2020-06-15 NOTE — Patient Instructions (Signed)
____________________________________________________________________________________________  Preparing for Procedure with Sedation  Procedure appointments are limited to planned procedures: . No Prescription Refills. . No disability issues will be discussed. . No medication changes will be discussed.  Instructions: . Oral Intake: Do not eat or drink anything for at least 8 hours prior to your procedure. (Exception: Blood Pressure Medication. See below.) . Transportation: Unless otherwise stated by your physician, you may drive yourself after the procedure. . Blood Pressure Medicine: Do not forget to take your blood pressure medicine with a sip of water the morning of the procedure. If your Diastolic (lower reading)is above 100 mmHg, elective cases will be cancelled/rescheduled. . Blood thinners: These will need to be stopped for procedures. Notify our staff if you are taking any blood thinners. Depending on which one you take, there will be specific instructions on how and when to stop it. . Diabetics on insulin: Notify the staff so that you can be scheduled 1st case in the morning. If your diabetes requires high dose insulin, take only  of your normal insulin dose the morning of the procedure and notify the staff that you have done so. . Preventing infections: Shower with an antibacterial soap the morning of your procedure. . Build-up your immune system: Take 1000 mg of Vitamin C with every meal (3 times a day) the day prior to your procedure. . Antibiotics: Inform the staff if you have a condition or reason that requires you to take antibiotics before dental procedures. . Pregnancy: If you are pregnant, call and cancel the procedure. . Sickness: If you have a cold, fever, or any active infections, call and cancel the procedure. . Arrival: You must be in the facility at least 30 minutes prior to your scheduled procedure. . Children: Do not bring children with you. . Dress appropriately:  Bring dark clothing that you would not mind if they get stained. . Valuables: Do not bring any jewelry or valuables.  Reasons to call and reschedule or cancel your procedure: (Following these recommendations will minimize the risk of a serious complication.) . Surgeries: Avoid having procedures within 2 weeks of any surgery. (Avoid for 2 weeks before or after any surgery). . Flu Shots: Avoid having procedures within 2 weeks of a flu shots or . (Avoid for 2 weeks before or after immunizations). . Barium: Avoid having a procedure within 7-10 days after having had a radiological study involving the use of radiological contrast. (Myelograms, Barium swallow or enema study). . Heart attacks: Avoid any elective procedures or surgeries for the initial 6 months after a "Myocardial Infarction" (Heart Attack). . Blood thinners: It is imperative that you stop these medications before procedures. Let us know if you if you take any blood thinner.  . Infection: Avoid procedures during or within two weeks of an infection (including chest colds or gastrointestinal problems). Symptoms associated with infections include: Localized redness, fever, chills, night sweats or profuse sweating, burning sensation when voiding, cough, congestion, stuffiness, runny nose, sore throat, diarrhea, nausea, vomiting, cold or Flu symptoms, recent or current infections. It is specially important if the infection is over the area that we intend to treat. . Heart and lung problems: Symptoms that may suggest an active cardiopulmonary problem include: cough, chest pain, breathing difficulties or shortness of breath, dizziness, ankle swelling, uncontrolled high or unusually low blood pressure, and/or palpitations. If you are experiencing any of these symptoms, cancel your procedure and contact your primary care physician for an evaluation.  Remember:  Regular Business hours are:    Monday to Thursday 8:00 AM to 4:00 PM  Provider's  Schedule: Marquavion Venhuizen, MD:  Procedure days: Tuesday and Thursday 7:30 AM to 4:00 PM  Bilal Lateef, MD:  Procedure days: Monday and Wednesday 7:30 AM to 4:00 PM ____________________________________________________________________________________________    

## 2020-06-22 ENCOUNTER — Telehealth: Payer: Self-pay

## 2020-06-22 NOTE — Telephone Encounter (Signed)
Insurance denied auth for Genicular RFA

## 2020-07-06 ENCOUNTER — Other Ambulatory Visit: Payer: Self-pay

## 2020-07-06 ENCOUNTER — Ambulatory Visit
Admission: RE | Admit: 2020-07-06 | Discharge: 2020-07-06 | Disposition: A | Discharge status: Home / Self Care | Payer: Medicaid Other | Source: Ambulatory Visit | Attending: Emergency Medicine | Admitting: Emergency Medicine

## 2020-07-06 VITALS — BP 107/93 | HR 123 | Temp 98.8°F | Resp 22 | Ht 64.0 in | Wt 265.0 lb

## 2020-07-06 DIAGNOSIS — U071 COVID-19: Secondary | ICD-10-CM | POA: Diagnosis not present

## 2020-07-06 DIAGNOSIS — J069 Acute upper respiratory infection, unspecified: Secondary | ICD-10-CM | POA: Insufficient documentation

## 2020-07-06 DIAGNOSIS — R197 Diarrhea, unspecified: Secondary | ICD-10-CM

## 2020-07-06 DIAGNOSIS — R059 Cough, unspecified: Secondary | ICD-10-CM | POA: Diagnosis present

## 2020-07-06 DIAGNOSIS — R112 Nausea with vomiting, unspecified: Secondary | ICD-10-CM | POA: Diagnosis not present

## 2020-07-06 MED ORDER — PROMETHAZINE-DM 6.25-15 MG/5ML PO SYRP: 5.0000 mL | ORAL_SOLUTION | Freq: Four times a day (QID) | ORAL | 0 refills | Status: DC | PRN

## 2020-07-06 MED ORDER — BENZONATATE 100 MG PO CAPS: 200.0000 mg | ORAL_CAPSULE | Freq: Three times a day (TID) | ORAL | 0 refills | Status: DC

## 2020-07-06 MED ORDER — ONDANSETRON 8 MG PO TBDP
8.0000 mg | ORAL_TABLET | Freq: Once | ORAL | Status: AC
  Administered 2020-07-06: 8 mg via ORAL

## 2020-07-06 MED ORDER — ONDANSETRON 8 MG PO TBDP: 8.0000 mg | ORAL_TABLET | Freq: Three times a day (TID) | ORAL | 0 refills | Status: DC | PRN
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
## 2020-07-06 NOTE — Discharge Instructions (Addendum)
Isolate until the results of your Covid test come back.  If they are positive you will need to quarantine for 10 days from the start of your symptoms.  After 10 days you can break quarantine if your symptoms have improved and you have not had a fever in 24 hours.  If you are positive we will refer you to the infusion clinic for monoclonal antibody infusion.  Use Tylenol and ibuprofen as needed for body aches and fever.  Take the Zofran every 8 hours as needed for nausea and vomiting.  Use the Tessalon Perles every 8 hours as needed for cough.  You may also use the promethazine DM for cough, nausea, and sleep.  If you develop worsening nausea and vomiting, or unable to keep down fluids, go to the ER for IV fluid administration.  Also, if you develop worsening shortness of breath, especially at rest, and are unable to speak in full sentences, you need to go to the ER for evaluation as well.

## 2020-07-06 NOTE — ED Triage Notes (Signed)
Pt c/o cough, nasal congestion, loss of smell and taste, loss of appetite, fatigue, weakness, chills, body aches, diarrhea, nausea and fever. Started about a week ago.  She states her daughter had covid about a week and a half ago.

## 2020-07-07 LAB — SARS CORONAVIRUS 2 (TAT 6-24 HRS): SARS Coronavirus 2: POSITIVE — AB

## 2020-07-08 ENCOUNTER — Telehealth: Payer: Self-pay | Admitting: Nurse Practitioner

## 2020-07-08 NOTE — Telephone Encounter (Signed)
Called to Discuss with patient about Covid symptoms and the use of the monoclonal antibody infusion for those with mild to moderate Covid symptoms and at a high risk of hospitalization.     Pt appears to qualify for this infusion due to co-morbid conditions and/or a member of an at-risk group in accordance with the FDA Emergency Use Authorization. BMI>25, CAD, diabetes.    Unable to reach pt. Voicemail full cannot leave message. My Chart message sent.   Alda Lea, NP [facility]  [phone]

## 2020-07-09 ENCOUNTER — Ambulatory Visit (HOSPITAL_COMMUNITY)
Admission: RE | Admit: 2020-07-09 | Discharge: 2020-07-09 | Disposition: A | Discharge status: Home / Self Care | Payer: Medicaid Other | Source: Ambulatory Visit | Attending: Critical Care Medicine | Admitting: Critical Care Medicine

## 2020-07-09 ENCOUNTER — Other Ambulatory Visit: Payer: Self-pay | Admitting: Nurse Practitioner

## 2020-07-09 ENCOUNTER — Telehealth: Payer: Self-pay | Admitting: Pain Medicine

## 2020-07-09 DIAGNOSIS — I471 Supraventricular tachycardia, unspecified: Secondary | ICD-10-CM

## 2020-07-09 DIAGNOSIS — Z6841 Body Mass Index (BMI) 40.0 and over, adult: Secondary | ICD-10-CM | POA: Diagnosis present

## 2020-07-09 DIAGNOSIS — U071 COVID-19: Secondary | ICD-10-CM

## 2020-07-09 MED ORDER — SODIUM CHLORIDE 0.9 % IV SOLN: Freq: Once | INTRAVENOUS | Status: AC

## 2020-07-09 MED ORDER — DIPHENHYDRAMINE HCL 50 MG/ML IJ SOLN: 50.0000 mg | Freq: Once | INTRAMUSCULAR | Status: DC | PRN

## 2020-07-09 MED ORDER — SODIUM CHLORIDE 0.9 % IV SOLN: INTRAVENOUS | Status: DC | PRN

## 2020-07-09 MED ORDER — ALBUTEROL SULFATE HFA 108 (90 BASE) MCG/ACT IN AERS: 2.0000 | INHALATION_SPRAY | Freq: Once | RESPIRATORY_TRACT | Status: DC | PRN

## 2020-07-09 MED ORDER — FAMOTIDINE IN NACL 20-0.9 MG/50ML-% IV SOLN: 20.0000 mg | Freq: Once | INTRAVENOUS | Status: DC | PRN

## 2020-07-09 MED ORDER — METHYLPREDNISOLONE SODIUM SUCC 125 MG IJ SOLR: 125.0000 mg | Freq: Once | INTRAMUSCULAR | Status: DC | PRN

## 2020-07-09 MED ORDER — EPINEPHRINE 0.3 MG/0.3ML IJ SOAJ: 0.3000 mg | Freq: Once | INTRAMUSCULAR | Status: DC | PRN

## 2020-07-09 NOTE — Telephone Encounter (Signed)
Hydrocodone needs PA

## 2020-07-09 NOTE — Discharge Instructions (Signed)

## 2020-07-09 NOTE — Progress Notes (Signed)
I connected by phone with Lauren [name] on 07/09/2020 at 12:53 AM to discuss the potential use of a new treatment for mild to moderate COVID-19 viral infection in non-hospitalized patients.  This patient is a 44 y.o. female that meets the FDA criteria for Emergency Use Authorization of COVID monoclonal antibody casirivimab/imdevimab or bamlanivimab/eteseviamb.  Has a (+) direct SARS-CoV-2 viral test result  Has mild or moderate COVID-19   Is NOT hospitalized due to COVID-19  Is within 10 days of symptom onset  Has at least one of the high risk factor(s) for progression to severe COVID-19 and/or hospitalization as defined in EUA.  Specific high risk criteria : BMI > 25 and Cardiovascular disease or hypertension   I have spoken and communicated the following to the patient or parent/caregiver regarding COVID monoclonal antibody treatment:  1. FDA has authorized the emergency use for the treatment of mild to moderate COVID-19 in adults and pediatric patients with positive results of direct SARS-CoV-2 viral testing who are 14 years of age and older weighing at least 40 kg, and who are at high risk for progressing to severe COVID-19 and/or hospitalization.  2. The significant known and potential risks and benefits of COVID monoclonal antibody, and the extent to which such potential risks and benefits are unknown.  3. Information on available alternative treatments and the risks and benefits of those alternatives, including clinical trials.  4. Patients treated with COVID monoclonal antibody should continue to self-isolate and use infection control measures (e.g., wear mask, isolate, social distance, avoid sharing personal items, clean and disinfect "high touch" surfaces, and frequent handwashing) according to CDC guidelines.   5. The patient or parent/caregiver has the option to accept or refuse COVID monoclonal antibody treatment.  After reviewing this information with the patient, the  patient has agreed to receive one of the available covid 19 monoclonal antibodies and will be provided an appropriate fact sheet prior to infusion. [name], NP 07/09/2020 12:53 AM

## 2020-07-10 NOTE — Telephone Encounter (Signed)
[place] called to check on PA for hydrocodone,  This PA was submitted by DW on 07/09/20.  Unable to see the status.  Pharmacist will call patient and talk to her about potentially waiting to hear from insurance this afternoon as that will be 24 hours at that time.

## 2020-07-15 ENCOUNTER — Telehealth: Payer: Self-pay

## 2020-07-15 ENCOUNTER — Telehealth: Payer: Self-pay | Admitting: Pain Medicine

## 2020-07-15 NOTE — Telephone Encounter (Signed)
Sent email to MD

## 2020-07-15 NOTE — Telephone Encounter (Signed)
Pharmacy called and said they could only give her 5 days of meds, the rest of the script was voided. They will need a new script for the remaining 75 days once you get auth.

## 2020-07-15 NOTE — Telephone Encounter (Signed)
Patient is still having problems with getting medications and pharmacy told her we were notified and taking care of this. She needs new scripts and PA for her medications so she can pick up full script instead of 5 days. Please call pharmacy and find out what is needed so patient can get her meds.

## 2020-07-15 NOTE — Telephone Encounter (Signed)
Nurse needs to call pharmacy to determine exactly what is needed before it goes to Dr. please

## 2020-07-15 NOTE — Telephone Encounter (Signed)
The pharmacy was called and patient was opiod naive so that's why they only sent 5 days worth. They stated that you could do a new prescription with the remaining amount of you could do a 30 day supply. thanks

## 2020-07-15 NOTE — Telephone Encounter (Signed)
Called and talked with patient and she states that Pharm, only was able to give her 5 days worth of Oxy. And will need another Prescription sent for the remaining 75 pills. Can you please do this for her and we will call and let her know.

## 2020-07-20 ENCOUNTER — Encounter: Payer: Self-pay | Admitting: Pain Medicine

## 2020-07-20 ENCOUNTER — Telehealth: Payer: Self-pay | Admitting: Pain Medicine

## 2020-07-20 ENCOUNTER — Ambulatory Visit: Payer: Medicaid Other | Attending: Pain Medicine | Admitting: Pain Medicine

## 2020-07-20 ENCOUNTER — Other Ambulatory Visit: Payer: Self-pay

## 2020-07-20 VITALS — BP 101/88 | HR 91 | Temp 98.1°F | Ht 64.0 in | Wt 245.0 lb

## 2020-07-20 DIAGNOSIS — M25561 Pain in right knee: Secondary | ICD-10-CM | POA: Diagnosis not present

## 2020-07-20 DIAGNOSIS — M170 Bilateral primary osteoarthritis of knee: Secondary | ICD-10-CM | POA: Insufficient documentation

## 2020-07-20 DIAGNOSIS — M545 Low back pain, unspecified: Secondary | ICD-10-CM | POA: Insufficient documentation

## 2020-07-20 DIAGNOSIS — G894 Chronic pain syndrome: Secondary | ICD-10-CM | POA: Diagnosis present

## 2020-07-20 DIAGNOSIS — G8929 Other chronic pain: Secondary | ICD-10-CM | POA: Insufficient documentation

## 2020-07-20 DIAGNOSIS — M25562 Pain in left knee: Secondary | ICD-10-CM | POA: Diagnosis present

## 2020-07-20 MED ORDER — KETOROLAC TROMETHAMINE 60 MG/2ML IM SOLN
60.0000 mg | Freq: Once | INTRAMUSCULAR | Status: AC
  Administered 2020-07-20: 60 mg via INTRAMUSCULAR

## 2020-07-20 MED ORDER — HYDROCODONE-ACETAMINOPHEN 5-325 MG PO TABS: 1.0000 | ORAL_TABLET | Freq: Three times a day (TID) | ORAL | 0 refills | Status: DC | PRN

## 2020-07-20 MED ORDER — ORPHENADRINE CITRATE 30 MG/ML IJ SOLN
60.0000 mg | Freq: Once | INTRAMUSCULAR | Status: AC
  Administered 2020-07-20: 60 mg via INTRAMUSCULAR

## 2020-07-20 MED ORDER — ORPHENADRINE CITRATE 30 MG/ML IJ SOLN
INTRAMUSCULAR | Status: AC
  Filled 2020-07-20: qty 2

## 2020-07-20 MED ORDER — KETOROLAC TROMETHAMINE 60 MG/2ML IM SOLN
INTRAMUSCULAR | Status: AC
  Filled 2020-07-20: qty 2

## 2020-07-20 NOTE — Telephone Encounter (Signed)
Pharmacy still says patient needs authorization for medications. Needs medicaid override as well as approval for the medication and dosage. Both Medicaid and [employer] say the have not received any PA for this patient.   [employer] lvmail stating patient need prior authoriztion [id number]

## 2020-07-20 NOTE — Progress Notes (Signed)
Nursing Pain Medication Assessment:  Safety precautions to be maintained throughout the outpatient stay will include: orient to surroundings, keep bed in low position, maintain call bell within reach at all times, provide assistance with transfer out of bed and ambulation.  Medication Inspection Compliance: Pill count conducted under aseptic conditions, in front of the patient. Neither the pills nor the bottle was removed from the patient's sight at any time. Once count was completed pills were immediately returned to the patient in their original bottle.  Medication: Hydrocodone/APAP Pill/Patch Count: 0 of 15 pills remain Pill/Patch Appearance: Markings consistent with prescribed medication Bottle Appearance: Standard pharmacy container. Clearly labeled. Filled Date: 21 / 58 / 21 Last Medication intake:  Ran out of medicine more than 48 hours agoSafety precautions to be maintained throughout the outpatient stay will include: orient to surroundings, keep bed in low position, maintain call bell within reach at all times, provide assistance with transfer out of bed and ambulation.

## 2020-07-20 NOTE — Progress Notes (Signed)
PROVIDER NOTE: Information contained herein reflects review and annotations entered in association with encounter. Interpretation of such information and data should be left to medically-trained personnel. Information provided to patient can be located elsewhere in the medical record under "Patient Instructions". Document created using STT-dictation technology, any transcriptional errors that may result from process are unintentional.    Patient: Lauren [name]  Service Category: E/M  Provider: Gaspar Cola, MD  DOB: 07-05-1976  DOS: 07/20/2020  Specialty: Interventional Pain Management  MRN: [id number]  Setting: Ambulatory outpatient  PCP: Martin Majestic, FNP  Type: Established Patient    Referring Provider: Lorelee Market, MD  Location: Office  Delivery: Face-to-face     HPI  [name], a 44 y.o. year old female, is here today because of her Chronic pain syndrome [G89.4]. [name]'s primary complain today is Knee Pain Last encounter: My last encounter with her was on 07/15/2020. Pertinent problems: [name] has Chronic knee pain (1ry area of Pain) (Bilateral) (L>R); Chronic daily headache; Chronic pain in right shoulder; Chronic generalized pain; Fibromyalgia; Chronic wrist pain (2ry area of Pain) (Left); Chronic shoulder pain (4th area of Pain) (Bilateral) (L>R); 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 (Bilateral); Chronic upper extremity pain (Left); Chronic neck pain (3ry area of Pain) (Bilateral) (L>R); Chronic lower extremity pain (Bilateral); Chronic sacroiliac joint pain; Chronic pain syndrome; Chronic musculoskeletal pain; Osteoarthritis of knees (Bilateral); Cervicalgia; Chronic low back pain (5th area of Pain) (Bilateral) (R>L) w/o sciatica; DDD (degenerative disc disease), lumbar; Neurogenic pain; Impaired ambulation; Lumbar spondylosis; Chronic knee pain (Left); Lipoma of shoulder (Left); Osteoarthritis of shoulder (Bilateral); Chronic forearm  pain (Left); Chronic shoulder pain (Left); Lipoma of neck (Left); Rotator cuff tendinitis, left; Rotator cuff tendinitis, right; Osteoarthritis involving multiple joints; Lumbar facet syndrome (Bilateral) (R>L); Spondylosis without myelopathy or radiculopathy, lumbosacral region; Osteoarthritis of shoulder (Left); Gastrocnemius muscle tear, left, initial encounter; and Acute exacerbation of chronic low back pain on their pertinent problem list. Pain Assessment: Severity of Chronic pain, Neuropathic pain is reported as a 9 /10. Location: Knee (knee, back, neck, shoulder, foot and elbow, wrist) Lower, Mid, Left, Right/pain is radiaties eveywhere. Onset: More than a month ago. Quality: Aching, Burning, Constant, Discomfort, Nagging, Stabbing, Shooting, Throbbing, Tingling. Timing: Constant. Modifying factor(s): pain meds and procedures. Vitals:  height is $RemoveB'5\' 4"'oliFaCSd$  (1.626 m) and weight is 245 lb (111.1 kg). Her temperature is 98.1 F (36.7 C). Her blood pressure is 101/88 and her pulse is 91. Her oxygen saturation is 100%.   Reason for encounter: medication management.  The patient comes today indicating that she was told by her PCP to perhaps ask [place] for an increase in her pain medicine or a switch to a different stronger pain medicine because her pain medicine was not taking care of the pain.  Rather than automatically doing this, I asked the patient what the problem was and she indicated that when she went to her pharmacy they gave her a prescription for only 5 days instead of the usual 30 days.  When I asked her if she had switched to a new insurance she indicated that she had not but that the pharmacist had told him that they had started this "new thing" where the first prescription had to be for 5 days and then that we would write a separate prescription for the remainder of the 30 days that she did not get with that prescription.  There are couple problems with this as I explained to  the patient: The first thing  is that this was not her first prescription; second is that all of our prescriptions clearly state that the STOP ACT does not apply since were doing this for a diagnosis of CHRONIC PAIN.  Today I have instructed the patient to contact the [facility] and login a former complaint against [employer] which had absolutely no right to interfere or go against direct medical orders, especially because they do not have a Product/process development scientist and practicing medicine without a license in the state of [place] it is still illegal.  However I also instructed the patient to first contact her insurance company to make sure that this "new rule" is real and that it was not something that the pharmacist came up with.  I informed her that should it turns out to be a problem with the pharmacist, then she should contact the [facility] of pharmacy to report this particular pharmacist.  I also made it very clear to the patient that I will not be sending additional prescriptions to the pharmacy just because the pharmacist said that I should or would.  I also reminded the patient that the decisions in terms of medication or dosing are also entirely mine and that although I welcome suggestions by the patient's PCP, the final decision as to what is done with the medicine is mine since I am the prescriber.  Of course, if this was to become an issue where the non-prescriber is not in agreement with my opioid analgesic medication management, I would be more than happy to transfer the care of that medicine to anyone else.  Sadly, the patient accepted from the pharmacist a partial prescription given (5 day supply) only, which directly goes against our medication regulations and recommendations.  Information about these rules have been provided to the patient in multiple locations and therefore today we have given her a final warning regarding not following those recommendations.  She is now well  aware that we will not be sending the pharmacy additional prescriptions after they have done a partial fill.    To make things even more complicated, the patient's insurance company has denied (L) Knee Genicular RFA #1, claiming not enough information was available.  If they had cared to actually read the patient's note that would have found plenty of appropriate documentation for this radiofrequency ablation.  This patient has been seen in our practice since before 04/12/2018 due to problems with bilateral knee pain.  On 02/19/2019 the patient underwent her first diagnostic bilateral genicular nerve block (superior-lateral, superior-medial, and inferior-medial genicular nerves) under fluoroscopic guidance.  Postprocedure follow-up demonstrated the patient to have attained 100% relief of the pain for the duration of the local anesthetic, which later dropped down to a 25% on the left side but continued to be a 90% improvement on the right side after this first injection.  A second diagnostic injection was done on 05/21/2020 which again provided the patient with 100% relief of the pain for the duration of the local anesthetic, bilaterally, which then went down to a 70% improvement that lasted over 2 weeks.  Unfortunately, the pain has continued to return and this is why we have requested approval for a bilateral genicular nerve radiofrequency ablation under fluoroscopic guidance and IV sedation.  We have confirmed the patient to have bilateral knee osteoarthritis on x-rays and bilateral MRIs of the knees.  The bilateral knee MRI was done on 10/06/2018.  Both of these  confirm intraarticular pathology.  Because all of these problems came as a consequence of the patient's morbid obesity, she was referred to medical weight management and bariatric surgery and eventually she underwent this surgery and has been able to lose a significant amount of weight.  However, the damage to the joints is a reversible.  The patient has  undergone all other therapies such as a total of 3 series of intra-articular Hyalgan knee injections which did provide the patient with temporary relief but over time they have become less effective.  Statement of Medical Necessity:  Ms. Pettifordcontinues to experienced debilitating chronic nerve-associated pain from the Chronic bilateral osteoarthritis of both knees.  Duration: This pain has persisted for longer than three months.  Non-surgical care: The patient has either failed to respond, or was unable to tolerate, or simply did not get enough benefit from other more conservative therapies including, but not limited to: 1. Over-the-counter oral analgesic medications (i.e.: ibuprofen, naproxen, etc.) 2. Anti-inflammatory medications 3. Muscle relaxants 4. Membrane stabilizers 5. Opioids 6. Physical therapy (PT), chiropractic manipulation, and/or home exercise program (HEP). 7. Modalities (Heat, ice, etc.)  Invasive therapies: Nerve blocks have failed to provide any significant long-term benefit.  Surgical care: The patient has indicated wanting to avoid surgery as long as possible.  Physical exam: Has been consistent with Severe knee arthropathy do to osteoarthritis of both knees.  Diagnostic imaging: Diagnostic knee joint x-rays and bilateral MRIs.  Studies have confirmed the presence of severe progressive osteoarthritis of both knees..                Diagnostic interventional therapies: Ms. Shira has attained greater than 50% reduction in pain from at least 2 bilateral diagnostic genicular nerve blocks conducted in separate occasions.   For the above listed reason, I believe, as the examining and treating physician, that it is medically necessary to proceed with Non-Pulsed Radiofrequency Ablation for the purpose of attempting to prolong the duration of the benefits seen with the diagnostic injections.  Patient's current PCP confirmed to be Gordy Clement, FNP.  RTCB:  11/05/2020 Nonopioids transferred 04/26/2020.  Because the patient's opioid analgesic therapy was interrupted by issues from her insurance company and her pharmacy, she did not have any medicine today and has not had any medicine for a couple days and therefore she has an acute exacerbation of her chronic pain.  Today I have offered her and provided her with IM injections of Toradol 60 mg + Norflex 60 mg to help with this acute pain until she can get to the pharmacy to fill her prescriptions.  Pharmacotherapy Assessment   Analgesic: Hydrocodone/APAP 5/325, 1 tab PO q 8 hrs (15 mg/day of hydrocodone) MME/day: 15 mg/day.   Monitoring: [place] PMP: PDMP reviewed during this encounter.       Pharmacotherapy: No side-effects or adverse reactions reported. Compliance: No problems identified. Effectiveness: Clinically acceptable.  Chauncey Fischer, RN  07/20/2020 11:58 AM  Sign when Signing Visit Nursing Pain Medication Assessment:  Safety precautions to be maintained throughout the outpatient stay will include: orient to surroundings, keep bed in low position, maintain call bell within reach at all times, provide assistance with transfer out of bed and ambulation.  Medication Inspection Compliance: Pill count conducted under aseptic conditions, in front of the patient. Neither the pills nor the bottle was removed from the patient's sight at any time. Once count was completed pills were immediately returned to the patient in their original bottle.  Medication: Hydrocodone/APAP Pill/Patch Count:  0 of 15 pills remain Pill/Patch Appearance: Markings consistent with prescribed medication Bottle Appearance: Standard pharmacy container. Clearly labeled. Filled Date: 19 / 83 / 21 Last Medication intake:  Ran out of medicine more than 48 hours agoSafety precautions to be maintained throughout the outpatient stay will include: orient to surroundings, keep bed in low position, maintain call bell within reach at all  times, provide assistance with transfer out of bed and ambulation.     UDS:  Summary  Date Value Ref Range Status  06/27/2019 Note  Final    Comment:    ==================================================================== [employer] [facility] (MW) ==================================================================== Test                             Result       Flag       Units Drug Present and Declared for Prescription Verification   Hydrocodone                    707          EXPECTED   ng/mg creat   Hydromorphone                  70           EXPECTED   ng/mg creat   Dihydrocodeine                 88           EXPECTED   ng/mg creat   Norhydrocodone                 963          EXPECTED   ng/mg creat    Sources of hydrocodone include scheduled prescription medications.    Hydromorphone, dihydrocodeine and norhydrocodone are expected    metabolites of hydrocodone. Hydromorphone and dihydrocodeine are    also available as scheduled prescription medications. Drug Present not Declared for Prescription Verification   Alpha-hydroxymidazolam         88           UNEXPECTED ng/mg creat    Alpha-hydroxymidazolam is an expected metabolite of midazolam.    Source of midazolam is a scheduled prescription medication. ==================================================================== Test                      Result    Flag   Units      Ref Range   Creatinine              268              mg/dL      >=20 ==================================================================== Declared Medications:  The flagging and interpretation on this report are based on the  following declared medications.  Unexpected results may arise from  inaccuracies in the declared medications.  **Note: The testing scope of this panel includes these medications:  Hydrocodone  **Note: The testing scope of this panel does not include the  following reported medications:  Acetaminophen  Albuterol  Aspirin   Beclomethasone  Diltiazem  Esomeprazole  Fluticasone  Furosemide  Hydrochlorothiazide  Ibuprofen  Losartan  Medroxyprogesterone  Metoprolol  Olodaterol  Pregabalin  Tiotropium ==================================================================== For clinical consultation, please call [phone]. ====================================================================      ROS  Constitutional: Denies any fever or chills Gastrointestinal: No reported hemesis, hematochezia, vomiting, or acute GI distress Musculoskeletal: Denies any acute onset joint swelling, redness, loss of  ROM, or weakness Neurological: No reported episodes of acute onset apraxia, aphasia, dysarthria, agnosia, amnesia, paralysis, loss of coordination, or loss of consciousness  Medication Review  Fish Oil, HYDROcodone-acetaminophen, SUMAtriptan, Tiotropium Bromide-Olodaterol, Turmeric, albuterol, aspirin EC, beclomethasone, benzonatate, butalbital-acetaminophen-caffeine, diltiazem, esomeprazole, ferrous sulfate, fluticasone, medroxyPROGESTERone Acetate, metoprolol succinate, pregabalin, and promethazine-dextromethorphan  History Review  Allergy: [name] is allergic to nucynta [tapentadol] and tape. Drug: [name]  reports no history of drug use. Alcohol:  reports no history of alcohol use. Tobacco:  reports that she has never smoked. She has never used smokeless tobacco. Social: [name]  reports that she has never smoked. She has never used smokeless tobacco. She reports that she does not drink alcohol and does not use drugs. Medical:  has a past medical history of Acute shoulder pain (Left) (12/26/2018), Advanced maternal age in multigravida, Asthma, Depression, GERD (gastroesophageal reflux disease), Grand multiparity, Headache, History of carpal tunnel surgery of left wrist (05/2018), Hypertension, Leg fracture, Obesity, morbid, BMI 40.0-49.9 (HCC), Post traumatic stress disorder, Pre-diabetes,  Pre-eclampsia, SVT (supraventricular tachycardia) (HCC), and Wrist fracture, closed. Surgical: [name]  has a past surgical history that includes Rotator cuff repair; Knee surgery; Cholecystectomy; Cesarean section (2010); Cesarean section (2011); Tonsillectomy; Cardiac electrophysiology study and ablation (01/18/2018); and Excision mass neck (Left, 10/11/2018). Family: family history includes Coronary artery disease in her brother; Diabetes in her brother, father, mother, sister, sister, sister, sister, sister, and sister; Hypertension in her brother and brother; Migraines in her father, maternal aunt, maternal uncle, paternal aunt, and paternal uncle; Ovarian cancer in her cousin; Stomach cancer in her sister; Throat cancer in her maternal aunt.  Laboratory Chemistry Profile   Renal Lab Results  Component Value Date   BUN 14 06/27/2019   CREATININE 0.65 06/27/2019   LABCREA 48 10/15/2016   BCR NOT APPLICABLE 08/02/2018   GFRAA >60 06/27/2019   GFRNONAA >60 06/27/2019     Hepatic Lab Results  Component Value Date   AST 14 (L) 06/27/2019   ALT 12 06/27/2019   ALBUMIN 3.9 06/27/2019   ALKPHOS 51 06/27/2019   LIPASE 27 09/05/2017     Electrolytes Lab Results  Component Value Date   NA 140 06/27/2019   K 3.8 06/27/2019   CL 103 06/27/2019   CALCIUM 9.0 06/27/2019   MG 1.8 06/27/2019     Bone Lab Results  Component Value Date   VD25OH 23.11 (L) 06/27/2019   25OHVITD1 32 03/14/2018   25OHVITD2 <1.0 03/14/2018   25OHVITD3 32 03/14/2018     Inflammation (CRP: Acute Phase) (ESR: Chronic Phase) Lab Results  Component Value Date   CRP 2.7 (H) 06/27/2019   ESRSEDRATE 39 (H) 06/27/2019       Note: Above Lab results reviewed.  Recent Imaging Review  DG PAIN CLINIC C-ARM 1-60 MIN NO REPORT Fluoro was used, but no Radiologist interpretation will be provided.  Please refer to "NOTES" tab for provider progress note. Note: Reviewed        Physical Exam  General  appearance: Well nourished, well developed, and well hydrated. In no apparent acute distress Mental status: Alert, oriented x 3 (person, place, & time)       Respiratory: No evidence of acute respiratory distress Eyes: PERLA Vitals: BP 101/88   Pulse 91   Temp 98.1 F (36.7 C)   Ht 5\' 4"  (1.626 m)   Wt 245 lb (111.1 kg)   SpO2 100%   BMI 42.05 kg/m  BMI: Estimated body mass index is 42.05 kg/m  as calculated from the following:   Height as of this encounter: $RemoveBeforeD'5\' 4"'qdTMVlSPzMmRtM$  (1.626 m).   Weight as of this encounter: 245 lb (111.1 kg). Ideal: Ideal body weight: 54.7 kg (120 lb 9.5 oz) Adjusted ideal body weight: 77.3 kg (170 lb 5.7 oz)  Assessment   Status Diagnosis  Controlled Controlled Controlled 1. Chronic pain syndrome   2. Chronic knee pain (1ry area of Pain) (Bilateral) (L>R)   3. Chronic knee pain (Left)   4. Osteoarthritis of knees (Bilateral)   5. Acute exacerbation of chronic low back pain      Updated Problems: Problem  Acute Exacerbation of Chronic Low Back Pain    Plan of Care  Problem-specific:  No problem-specific Assessment & Plan notes found for this encounter.  [name] has a current medication list which includes the following long-term medication(s): diltiazem, ferrous sulfate, metoprolol succinate, pregabalin, sumatriptan, fluticasone, hydrocodone-acetaminophen, [START ON 08/19/2020] hydrocodone-acetaminophen, [START ON 09/18/2020] hydrocodone-acetaminophen, [DISCONTINUED] albuterol, and [DISCONTINUED] esomeprazole.  Pharmacotherapy (Medications Ordered): Meds ordered this encounter  Medications  . HYDROcodone-acetaminophen (NORCO/VICODIN) 5-325 MG tablet    Sig: Take 1 tablet by mouth every 8 (eight) hours as needed for severe pain. Must last 30 days    Dispense:  90 tablet    Refill:  0    Chronic Pain: STOP Act (Not applicable) Fill 1 day early if closed on refill date. Avoid benzodiazepines within 8 hours of opioids  . HYDROcodone-acetaminophen  (NORCO/VICODIN) 5-325 MG tablet    Sig: Take 1 tablet by mouth every 8 (eight) hours as needed for severe pain. Must last 30 days    Dispense:  90 tablet    Refill:  0    Chronic Pain: STOP Act (Not applicable) Fill 1 day early if closed on refill date. Avoid benzodiazepines within 8 hours of opioids  . HYDROcodone-acetaminophen (NORCO/VICODIN) 5-325 MG tablet    Sig: Take 1 tablet by mouth every 8 (eight) hours as needed for severe pain. Must last 30 days    Dispense:  90 tablet    Refill:  0    Chronic Pain: STOP Act (Not applicable) Fill 1 day early if closed on refill date. Avoid benzodiazepines within 8 hours of opioids  . ketorolac (TORADOL) injection 60 mg  . orphenadrine (NORFLEX) injection 60 mg   Orders:  No orders of the defined types were placed in this encounter.  Follow-up plan:   Return in about 3 months (around 10/18/2020) for (F2F), (Med Mgmt), in addition, Radio-Frequency: (L) Knee Genicular RFA #1.      Considering: NOTE:NO Lumbar RFAuntil BMIis at or below 35. Diagnostic bilateral cervical facet NB Diagnostic bilateral cervical ESI Possible bilateral cervical facet RFA Diagnostic bilateral suprascapular NB Possible bilateral suprascapular nerve RFA Diagnostic bilateral LESI Possible bilateral lumbar facet RFA(NO Lumbar RFAuntil BMIis at or below 35.)   Palliative PRN treatment(s): Palliative bilateral IA Hyalgan knee injection S1N3(Fluoro required)(1st - 7mo)(2nd - 3.9 mo) Palliative bilateral IA knee joint (steroid) #2  Diagnostic/therapeutic bilateral knee genicular NB #3 Diagnostic/therapeutic left IA wrist injection (MNB/TPI) #2 Diagnostic bilateral IA shoulder injection #3 Diagnostic left suprascapular NB #2 Therapeutic left L4-5 LESI #4  Diagnostic right lumbar facet block #2    Recent Visits Date Type Provider Dept  06/15/20 Telemedicine Milinda Pointer, MD [facility]  05/21/20 Procedure visit Milinda Pointer,  MD [facility]  04/27/20 Telemedicine Milinda Pointer, MD [facility]  Showing recent visits within past 90 days and meeting all other requirements  Today's Visits Date Type Provider Dept  07/20/20 Office Visit Milinda Pointer, MD [facility]  Showing today's visits and meeting all other requirements Future Appointments Date Type Provider Dept  10/14/20 Appointment Milinda Pointer, MD [facility]  Showing future appointments within next 90 days and meeting all other requirements  I discussed the assessment and treatment plan with the patient. The patient was provided an opportunity to ask questions and all were answered. The patient agreed with the plan and demonstrated an understanding of the instructions.  Patient advised to call back or seek an in-person evaluation if the symptoms or condition worsens.  Duration of encounter: 58 minutes.  Note by: Gaspar Cola, MD Date: 07/20/2020; Time: 12:56 PM

## 2020-07-20 NOTE — Patient Instructions (Addendum)
____________________________________________________________________________________________  Drug Holidays (Slow)  What is a "Drug Holiday"? Drug Holiday: is the name given to the period of time during which a patient stops taking a medication(s) for the purpose of eliminating tolerance to the drug.  Benefits . Improved effectiveness of opioids. . Decreased opioid dose needed to achieve benefits. . Improved pain with lesser dose.  What is tolerance? Tolerance: is the progressive decreased in effectiveness of a drug due to its repetitive use. With repetitive use, the body gets use to the medication and as a consequence, it loses its effectiveness. This is a common problem seen with opioid pain medications. As a result, a larger dose of the drug is needed to achieve the same effect that used to be obtained with a smaller dose.  How long should a "Drug Holiday" last? You should stay off of the pain medicine for at least 14 consecutive days. (2 weeks)  Should I stop the medicine "cold turkey"? No. You should always coordinate with your Pain Specialist so that he/she can provide you with the correct medication dose to make the transition as smoothly as possible.  How do I stop the medicine? Slowly. You will be instructed to decrease the daily amount of pills that you take by one (1) pill every seven (7) days. This is called a "slow downward taper" of your dose. For example: if you normally take four (4) pills per day, you will be asked to drop this dose to three (3) pills per day for seven (7) days, then to two (2) pills per day for seven (7) days, then to one (1) per day for seven (7) days, and at the end of those last seven (7) days, this is when the "Drug Holiday" would start.   Will I have withdrawals? By doing a "slow downward taper" like this one, it is unlikely that you will experience any significant withdrawal symptoms. Typically, what triggers withdrawals is the sudden stop of a high  dose opioid therapy. Withdrawals can usually be avoided by slowly decreasing the dose over a prolonged period of time. If you do not follow these instructions and decide to stop your medication abruptly, withdrawals may be possible.  What are withdrawals? Withdrawals: refers to the wide range of symptoms that occur after stopping or dramatically reducing opiate drugs after heavy and prolonged use. Withdrawal symptoms do not occur to patients that use low dose opioids, or those who take the medication sporadically. Contrary to benzodiazepine (example: Valium, Xanax, etc.) or alcohol withdrawals ("Delirium Tremens"), opioid withdrawals are not lethal. Withdrawals are the physical manifestation of the body getting rid of the excess receptors.  Expected Symptoms Early symptoms of withdrawal may include: . Agitation . Anxiety . Muscle aches . Increased tearing . Insomnia . Runny nose . Sweating . Yawning  Late symptoms of withdrawal may include: . Abdominal cramping . Diarrhea . Dilated pupils . Goose bumps . Nausea . Vomiting  Will I experience withdrawals? Due to the slow nature of the taper, it is very unlikely that you will experience any.  What is a slow taper? Taper: refers to the gradual decrease in dose.  (Last update: 04/01/2020) ____________________________________________________________________________________________    ____________________________________________________________________________________________  Medication Rules  Purpose: To inform patients, and their family members, of our rules and regulations.  Applies to: All patients receiving prescriptions (written or electronic).  Pharmacy of record: Pharmacy where electronic prescriptions will be sent. If written prescriptions are taken to a different pharmacy, please inform the nursing staff. The pharmacy   listed in the electronic medical record should be the one where you would like electronic prescriptions  to be sent.  Electronic prescriptions: In compliance with the [place] Strengthen Opioid Misuse Prevention (STOP) Act of 2017 (Session Law 2017-74/H243), effective September 12, 2018, all controlled substances must be electronically prescribed. Calling prescriptions to the pharmacy will cease to exist.  Prescription refills: Only during scheduled appointments. Applies to all prescriptions.  NOTE: The following applies primarily to controlled substances (Opioid* Pain Medications).   Type of encounter (visit): For patients receiving controlled substances, face-to-face visits are required. (Not an option or up to the patient.)  Patient's responsibilities: 1. Pain Pills: Bring all pain pills to every appointment (except for procedure appointments). 2. Pill Bottles: Bring pills in original pharmacy bottle. Always bring the newest bottle. Bring bottle, even if empty. 3. Medication refills: You are responsible for knowing and keeping track of what medications you take and those you need refilled. The day before your appointment: write a list of all prescriptions that need to be refilled. The day of the appointment: give the list to the admitting nurse. Prescriptions will be written only during appointments. No prescriptions will be written on procedure days. If you forget a medication: it will not be "Called in", "Faxed", or "electronically sent". You will need to get another appointment to get these prescribed. No early refills. Do not call asking to have your prescription filled early. 4. Prescription Accuracy: You are responsible for carefully inspecting your prescriptions before leaving our office. Have the discharge nurse carefully go over each prescription with you, before taking them home. Make sure that your name is accurately spelled, that your address is correct. Check the name and dose of your medication to make sure it is accurate. Check the number of pills, and the written instructions to  make sure they are clear and accurate. Make sure that you are given enough medication to last until your next medication refill appointment. 5. Taking Medication: Take medication as prescribed. When it comes to controlled substances, taking less pills or less frequently than prescribed is permitted and encouraged. Never take more pills than instructed. Never take medication more frequently than prescribed.  6. Inform other Doctors: Always inform, all of your healthcare providers, of all the medications you take. 7. Pain Medication from other Providers: You are not allowed to accept any additional pain medication from any other Doctor or Healthcare provider. There are two exceptions to this rule. (see below) In the event that you require additional pain medication, you are responsible for notifying us, as stated below. 8. Medication Agreement: You are responsible for carefully reading and following our Medication Agreement. This must be signed before receiving any prescriptions from our practice. Safely store a copy of your signed Agreement. Violations to the Agreement will result in no further prescriptions. (Additional copies of our Medication Agreement are available upon request.) 9. Laws, Rules, & Regulations: All patients are expected to follow all Federal and State Laws, Statutes, Rules, & Regulations. Ignorance of the Laws does not constitute a valid excuse.  10. Illegal drugs and Controlled Substances: The use of illegal substances (including, but not limited to marijuana and its derivatives) and/or the illegal use of any controlled substances is strictly prohibited. Violation of this rule may result in the immediate and permanent discontinuation of any and all prescriptions being written by our practice. The use of any illegal substances is prohibited. 11. Adopted CDC guidelines & recommendations: Target dosing levels will be at or   below 60 MME/day. Use of benzodiazepines** is not  recommended.  Exceptions: There are only two exceptions to the rule of not receiving pain medications from other Healthcare Providers. 1. Exception #1 (Emergencies): In the event of an emergency (i.e.: accident requiring emergency care), you are allowed to receive additional pain medication. However, you are responsible for: As soon as you are able, call our office [phone], at any time of the day or night, and leave a message stating your name, the date and nature of the emergency, and the name and dose of the medication prescribed. In the event that your call is answered by a member of our staff, make sure to document and save the date, time, and the name of the person that took your information.  2. Exception #2 (Planned Surgery): In the event that you are scheduled by another doctor or dentist to have any type of surgery or procedure, you are allowed (for a period no longer than 30 days), to receive additional pain medication, for the acute post-op pain. However, in this case, you are responsible for picking up a copy of our "Post-op Pain Management for Surgeons" handout, and giving it to your surgeon or dentist. This document is available at our office, and does not require an appointment to obtain it. Simply go to our office during business hours (Monday-Thursday from 8:00 AM to 4:00 PM) (Friday 8:00 AM to 12:00 Noon) or if you have a scheduled appointment with [place], prior to your surgery, and ask for it by name. In addition, you are responsible for: calling our office [phone], at any time of the day or night, and leaving a message stating your name, name of your surgeon, type of surgery, and date of procedure or surgery. Failure to comply with your responsibilities may result in termination of therapy involving the controlled substances.  *Opioid medications include: morphine, codeine, oxycodone, oxymorphone, hydrocodone, hydromorphone, meperidine, tramadol, tapentadol, buprenorphine,  fentanyl, methadone. **Benzodiazepine medications include: diazepam (Valium), alprazolam (Xanax), clonazepam (Klonopine), lorazepam (Ativan), clorazepate (Tranxene), chlordiazepoxide (Librium), estazolam (Prosom), oxazepam (Serax), temazepam (Restoril), triazolam (Halcion) (Last updated: 05/19/2020) ____________________________________________________________________________________________   ____________________________________________________________________________________________  Medication Recommendations and Reminders  Applies to: All patients receiving prescriptions (written and/or electronic).  Medication Rules & Regulations: These rules and regulations exist for your safety and that of others. They are not flexible and neither are we. Dismissing or ignoring them will be considered "non-compliance" with medication therapy, resulting in complete and irreversible termination of such therapy. (See document titled "Medication Rules" for more details.) In all conscience, because of safety reasons, we cannot continue providing a therapy where the patient does not follow instructions.  Pharmacy of record:   Definition: This is the pharmacy where your electronic prescriptions will be sent.   We do not endorse any particular pharmacy, however, we have experienced problems with [facility] not securing enough medication supply for the community.  We do not restrict you in your choice of pharmacy. However, once we write for your prescriptions, we will NOT be re-sending more prescriptions to fix restricted supply problems created by your pharmacy, or your insurance.   The pharmacy listed in the electronic medical record should be the one where you want electronic prescriptions to be sent.  If you choose to change pharmacy, simply notify our nursing staff.  Recommendations:  Keep all of your pain medications in a safe place, under lock and key, even if you live alone. We will NOT replace lost,  stolen, or damaged medication.  After  you fill your prescription, take 1 week's worth of pills and put them away in a safe place. You should keep a separate, properly labeled bottle for this purpose. The remainder should be kept in the original bottle. Use this as your primary supply, until it runs out. Once it's gone, then you know that you have 1 week's worth of medicine, and it is time to come in for a prescription refill. If you do this correctly, it is unlikely that you will ever run out of medicine.  To make sure that the above recommendation works, it is very important that you make sure your medication refill appointments are scheduled at least 1 week before you run out of medicine. To do this in an effective manner, make sure that you do not leave the office without scheduling your next medication management appointment. Always ask the nursing staff to show you in your prescription , when your medication will be running out. Then arrange for the receptionist to get you a return appointment, at least 7 days before you run out of medicine. Do not wait until you have 1 or 2 pills left, to come in. This is very poor planning and does not take into consideration that we may need to cancel appointments due to bad weather, sickness, or emergencies affecting our staff.  DO NOT ACCEPT A "Partial Fill": If for any reason your pharmacy does not have enough pills/tablets to completely fill or refill your prescription, do not allow for a "partial fill". The law allows the pharmacy to complete that prescription within 72 hours, without requiring a new prescription. If they do not fill the rest of your prescription within those 72 hours, you will need a separate prescription to fill the remaining amount, which we will NOT provide. If the reason for the partial fill is your insurance, you will need to talk to the pharmacist about payment alternatives for the remaining tablets, but again, DO NOT ACCEPT A PARTIAL FILL,  unless you can trust your pharmacist to obtain the remainder of the pills within 72 hours.  Prescription refills and/or changes in medication(s):   Prescription refills, and/or changes in dose or medication, will be conducted only during scheduled medication management appointments. (Applies to both, written and electronic prescriptions.)  No refills on procedure days. No medication will be changed or started on procedure days. No changes, adjustments, and/or refills will be conducted on a procedure day. Doing so will interfere with the diagnostic portion of the procedure.  No phone refills. No medications will be "called into the pharmacy".  No Fax refills.  No weekend refills.  No Holliday refills.  No after hours refills.  Remember:  Business hours are:  Monday to Thursday 8:00 AM to 4:00 PM Provider's Schedule: Milinda Pointer, MD - Appointments are:  Medication management: Monday and Wednesday 8:00 AM to 4:00 PM Procedure day: Tuesday and Thursday 7:30 AM to 4:00 PM Gillis Santa, MD - Appointments are:  Medication management: Tuesday and Thursday 8:00 AM to 4:00 PM Procedure day: Monday and Wednesday 7:30 AM to 4:00 PM (Last update: 04/01/2020) ____________________________________________________________________________________________  ____________________________________________________________________________________________  Preparing for Procedure with Sedation  Procedure appointments are limited to planned procedures: . No Prescription Refills. . No disability issues will be discussed. . No medication changes will be discussed.  Instructions: . Oral Intake: Do not eat or drink anything for at least 8 hours prior to your procedure. (Exception: Blood Pressure Medication. See below.) . Transportation: Unless otherwise stated by your physician,  you may drive yourself after the procedure. . Blood Pressure Medicine: Do not forget to take your blood pressure medicine  with a sip of water the morning of the procedure. If your Diastolic (lower reading)is above 100 mmHg, elective cases will be cancelled/rescheduled. . Blood thinners: These will need to be stopped for procedures. Notify our staff if you are taking any blood thinners. Depending on which one you take, there will be specific instructions on how and when to stop it. . Diabetics on insulin: Notify the staff so that you can be scheduled 1st case in the morning. If your diabetes requires high dose insulin, take only  of your normal insulin dose the morning of the procedure and notify the staff that you have done so. . Preventing infections: Shower with an antibacterial soap the morning of your procedure. . Build-up your immune system: Take 1000 mg of Vitamin C with every meal (3 times a day) the day prior to your procedure. Marland Kitchen Antibiotics: Inform the staff if you have a condition or reason that requires you to take antibiotics before dental procedures. . Pregnancy: If you are pregnant, call and cancel the procedure. . Sickness: If you have a cold, fever, or any active infections, call and cancel the procedure. . Arrival: You must be in the facility at least 30 minutes prior to your scheduled procedure. . Children: Do not bring children with you. . Dress appropriately: Bring dark clothing that you would not mind if they get stained. . Valuables: Do not bring any jewelry or valuables.  Reasons to call and reschedule or cancel your procedure: (Following these recommendations will minimize the risk of a serious complication.) . Surgeries: Avoid having procedures within 2 weeks of any surgery. (Avoid for 2 weeks before or after any surgery). . Flu Shots: Avoid having procedures within 2 weeks of a flu shots or . (Avoid for 2 weeks before or after immunizations). . Barium: Avoid having a procedure within 7-10 days after having had a radiological study involving the use of radiological contrast. (Myelograms, Barium  swallow or enema study). . Heart attacks: Avoid any elective procedures or surgeries for the initial 6 months after a "Myocardial Infarction" (Heart Attack). . Blood thinners: It is imperative that you stop these medications before procedures. Let us know if you if you take any blood thinner.  . Infection: Avoid procedures during or within two weeks of an infection (including chest colds or gastrointestinal problems). Symptoms associated with infections include: Localized redness, fever, chills, night sweats or profuse sweating, burning sensation when voiding, cough, congestion, stuffiness, runny nose, sore throat, diarrhea, nausea, vomiting, cold or Flu symptoms, recent or current infections. It is specially important if the infection is over the area that we intend to treat. Marland Kitchen Heart and lung problems: Symptoms that may suggest an active cardiopulmonary problem include: cough, chest pain, breathing difficulties or shortness of breath, dizziness, ankle swelling, uncontrolled high or unusually low blood pressure, and/or palpitations. If you are experiencing any of these symptoms, cancel your procedure and contact your primary care physician for an evaluation.  Remember:  Regular Business hours are:  Monday to Thursday 8:00 AM to 4:00 PM  Provider's Schedule: Milinda Pointer, MD:  Procedure days: Tuesday and Thursday 7:30 AM to 4:00 PM  Gillis Santa, MD:  Procedure days: Monday and Wednesday 7:30 AM to 4:00 PM ____________________________________________________________________________________________

## 2020-07-21 NOTE — Telephone Encounter (Signed)
I have submitted it through [facility], with no response. I called [employer], and they cannot locate the patient with the information I provided. I attempted to call the patient to verify her insurance information, mailbox full, unable to leave a message.

## 2020-07-21 NOTE — Telephone Encounter (Signed)
She called asking if this had been done. She is out of medicine and needs this done asap.

## 2020-07-22 ENCOUNTER — Telehealth: Payer: Self-pay

## 2020-07-22 NOTE — Telephone Encounter (Signed)
Patient will call [facility] and get the info from her card.

## 2020-07-22 NOTE — Telephone Encounter (Signed)
Opened in error

## 2020-07-22 NOTE — Telephone Encounter (Signed)
Her insurance denied auth again for the genicular RFA. If you want to do a peer to peer you can call [phone]. Or I will be glad to schedule one for you if you'd like. This is the second time they have denied this procedure request.

## 2020-07-23 ENCOUNTER — Telehealth: Payer: Self-pay

## 2020-07-23 NOTE — Telephone Encounter (Signed)
Patient not notified. Mailbox is full.

## 2020-07-23 NOTE — Telephone Encounter (Signed)
[employer] still doesn't have her medicine request. Please take care of this today. She called with her new medicaid number, it is the same its always been. It is [id number].  The main number to call it in is [phone].

## 2020-07-23 NOTE — Telephone Encounter (Signed)
PA submitted over the phone at this number. They told me that we will receive a fax with the determination within 24 hours. Patient notified.

## 2020-09-30 ENCOUNTER — Ambulatory Visit
Admission: EM | Admit: 2020-09-30 | Discharge: 2020-09-30 | Disposition: A | Discharge status: Home / Self Care | Payer: Medicaid Other | Attending: Emergency Medicine | Admitting: Emergency Medicine

## 2020-09-30 ENCOUNTER — Encounter: Payer: Self-pay | Admitting: Emergency Medicine

## 2020-09-30 ENCOUNTER — Other Ambulatory Visit: Payer: Self-pay

## 2020-09-30 DIAGNOSIS — S161XXA Strain of muscle, fascia and tendon at neck level, initial encounter: Secondary | ICD-10-CM

## 2020-09-30 DIAGNOSIS — S29019A Strain of muscle and tendon of unspecified wall of thorax, initial encounter: Secondary | ICD-10-CM

## 2020-09-30 MED ORDER — METHOCARBAMOL 500 MG PO TABS: 500.0000 mg | ORAL_TABLET | Freq: Two times a day (BID) | ORAL | 0 refills | Status: DC

## 2020-09-30 MED ORDER — IBUPROFEN 600 MG PO TABS: 600.0000 mg | ORAL_TABLET | Freq: Four times a day (QID) | ORAL | 0 refills | Status: DC | PRN
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
## 2020-09-30 NOTE — Discharge Instructions (Addendum)
Take the ibuprofen every 6 hours with food on a schedule for the next 2 days and then as needed.  Take the methocarbamol every 6 hours with food on a schedule for the next 2 days and then as needed to help alleviate spasm.  Performed range of motion exercises that we discussed, and are in your discharge paperwork, to help alleviate spasm.  Apply moist heat to your neck and back for 20 min at a time 2-3 times a day to help improve blood flow and alleviate spasm.  If your symptoms do not improve follow-up with your primary care provider.

## 2020-10-14 ENCOUNTER — Encounter: Payer: Medicaid Other | Admitting: Pain Medicine

## 2020-10-14 ENCOUNTER — Other Ambulatory Visit: Payer: Self-pay | Admitting: Pain Medicine

## 2020-10-14 DIAGNOSIS — F1120 Opioid dependence, uncomplicated: Secondary | ICD-10-CM | POA: Insufficient documentation

## 2020-10-14 DIAGNOSIS — Z91199 Patient's noncompliance with other medical treatment and regimen due to unspecified reason: Secondary | ICD-10-CM

## 2020-10-14 DIAGNOSIS — Z5329 Procedure and treatment not carried out because of patient's decision for other reasons: Secondary | ICD-10-CM
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
## 2020-10-14 NOTE — Progress Notes (Deleted)
No show to appointment.

## 2020-12-05 ENCOUNTER — Ambulatory Visit (INDEPENDENT_AMBULATORY_CARE_PROVIDER_SITE_OTHER): Payer: Medicaid Other

## 2020-12-05 ENCOUNTER — Encounter: Payer: Self-pay | Admitting: Gynecology

## 2020-12-05 ENCOUNTER — Other Ambulatory Visit: Payer: Self-pay

## 2020-12-05 ENCOUNTER — Ambulatory Visit
Admission: EM | Admit: 2020-12-05 | Discharge: 2020-12-05 | Disposition: A | Discharge status: Home / Self Care | Payer: Medicaid Other | Attending: Family Medicine | Admitting: Family Medicine

## 2020-12-05 DIAGNOSIS — G894 Chronic pain syndrome: Secondary | ICD-10-CM | POA: Diagnosis not present

## 2020-12-05 DIAGNOSIS — M545 Low back pain, unspecified: Secondary | ICD-10-CM | POA: Insufficient documentation

## 2020-12-05 DIAGNOSIS — F1120 Opioid dependence, uncomplicated: Secondary | ICD-10-CM | POA: Insufficient documentation

## 2020-12-05 DIAGNOSIS — G8929 Other chronic pain: Secondary | ICD-10-CM | POA: Diagnosis present

## 2020-12-05 DIAGNOSIS — W19XXXA Unspecified fall, initial encounter: Secondary | ICD-10-CM | POA: Diagnosis not present

## 2020-12-05 DIAGNOSIS — Z7982 Long term (current) use of aspirin: Secondary | ICD-10-CM | POA: Diagnosis not present

## 2020-12-05 DIAGNOSIS — Z791 Long term (current) use of non-steroidal anti-inflammatories (NSAID): Secondary | ICD-10-CM | POA: Diagnosis not present

## 2020-12-05 DIAGNOSIS — Z6841 Body Mass Index (BMI) 40.0 and over, adult: Secondary | ICD-10-CM | POA: Diagnosis not present

## 2020-12-05 DIAGNOSIS — M5459 Other low back pain: Secondary | ICD-10-CM | POA: Diagnosis not present

## 2020-12-05 LAB — URINALYSIS, COMPLETE (UACMP) WITH MICROSCOPIC
Bilirubin Urine: NEGATIVE
Glucose, UA: NEGATIVE mg/dL
Hgb urine dipstick: NEGATIVE
Ketones, ur: NEGATIVE mg/dL
Leukocytes,Ua: NEGATIVE
Nitrite: NEGATIVE
Protein, ur: NEGATIVE mg/dL
Specific Gravity, Urine: 1.025 (ref 1.005–1.030)
pH: 6 (ref 5.0–8.0)

## 2020-12-05 MED ORDER — TIZANIDINE HCL 4 MG PO TABS: 4.0000 mg | ORAL_TABLET | Freq: Three times a day (TID) | ORAL | 0 refills | Status: DC | PRN

## 2020-12-05 MED ORDER — PREDNISONE 10 MG (21) PO TBPK: ORAL_TABLET | ORAL | 0 refills | Status: DC

## 2020-12-05 NOTE — Discharge Instructions (Signed)
Stop the celebrex while on the Prednisone.  Muscle relaxer as directed.  Follow up with your physicians.  Take care  Dr. Lacinda Axon
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
## 2020-12-05 NOTE — ED Triage Notes (Signed)
Patient c/o lower back pain x 3 days. Patient stated right knee gave out while going downstairs and she fell x 3 days. Patient also question having kidney infection.

## 2020-12-10 DIAGNOSIS — J342 Deviated nasal septum: Secondary | ICD-10-CM | POA: Insufficient documentation

## 2020-12-10 DIAGNOSIS — J310 Chronic rhinitis: Secondary | ICD-10-CM | POA: Insufficient documentation

## 2021-02-17 ENCOUNTER — Other Ambulatory Visit: Payer: Self-pay | Admitting: Family Medicine

## 2021-06-10 ENCOUNTER — Encounter: Payer: Self-pay | Admitting: General Surgery

## 2021-06-16 ENCOUNTER — Encounter: Payer: Self-pay | Admitting: Oncology

## 2021-06-18 ENCOUNTER — Ambulatory Visit (LOCAL_COMMUNITY_HEALTH_CENTER): Payer: Self-pay

## 2021-06-18 ENCOUNTER — Other Ambulatory Visit: Payer: Self-pay

## 2021-06-18 DIAGNOSIS — Z111 Encounter for screening for respiratory tuberculosis: Secondary | ICD-10-CM

## 2021-06-21 ENCOUNTER — Other Ambulatory Visit: Payer: Medicaid Other

## 2021-07-20 ENCOUNTER — Other Ambulatory Visit: Payer: Medicaid Other

## 2021-07-26 ENCOUNTER — Ambulatory Visit (LOCAL_COMMUNITY_HEALTH_CENTER): Payer: Self-pay

## 2021-07-26 ENCOUNTER — Other Ambulatory Visit: Payer: Self-pay

## 2021-07-26 DIAGNOSIS — Z111 Encounter for screening for respiratory tuberculosis: Secondary | ICD-10-CM

## 2021-08-17 ENCOUNTER — Other Ambulatory Visit: Payer: Medicaid Other

## 2022-01-14 ENCOUNTER — Emergency Department
Admission: EM | Admit: 2022-01-14 | Discharge: 2022-01-14 | Disposition: A | Discharge status: Home / Self Care | Payer: Medicaid Other | Attending: Emergency Medicine | Admitting: Emergency Medicine

## 2022-01-14 ENCOUNTER — Encounter: Payer: Self-pay | Admitting: Oncology

## 2022-01-14 ENCOUNTER — Emergency Department: Payer: Medicaid Other

## 2022-01-14 ENCOUNTER — Encounter: Payer: Self-pay | Admitting: Intensive Care

## 2022-01-14 ENCOUNTER — Other Ambulatory Visit: Payer: Self-pay

## 2022-01-14 DIAGNOSIS — S199XXA Unspecified injury of neck, initial encounter: Secondary | ICD-10-CM | POA: Diagnosis present

## 2022-01-14 DIAGNOSIS — Y92410 Unspecified street and highway as the place of occurrence of the external cause: Secondary | ICD-10-CM | POA: Insufficient documentation

## 2022-01-14 DIAGNOSIS — S3992XA Unspecified injury of lower back, initial encounter: Secondary | ICD-10-CM | POA: Insufficient documentation

## 2022-01-14 HISTORY — DX: Unspecified osteoarthritis, unspecified site: M19.90

## 2022-01-14 HISTORY — DX: Type 2 diabetes mellitus without complications: E11.9

## 2022-01-14 LAB — POC URINE PREG, ED: Preg Test, Ur: NEGATIVE

## 2022-01-14 MED ORDER — LIDOCAINE 5 % EX PTCH: 1.0000 | MEDICATED_PATCH | Freq: Two times a day (BID) | CUTANEOUS | 0 refills | Status: AC

## 2022-01-14 MED ORDER — CYCLOBENZAPRINE HCL 10 MG PO TABS: 10.0000 mg | ORAL_TABLET | Freq: Three times a day (TID) | ORAL | 0 refills | Status: AC | PRN

## 2022-01-14 NOTE — Discharge Instructions (Addendum)
-  Treat pain with Tylenol/ibuprofen as needed.  You may additionally take cyclobenzaprine and lidocaine patches as well. ? ?-If your pain or right arm numbness persists after a few weeks, you may schedule an appoint with the orthopedist listed above. ? ?-Return to the emergency department anytime if you begin to experience any new or worsening symptoms. ?
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
## 2022-01-29 ENCOUNTER — Emergency Department
Admission: EM | Admit: 2022-01-29 | Discharge: 2022-01-29 | Disposition: A | Discharge status: Home / Self Care | Payer: Medicaid Other | Attending: Emergency Medicine | Admitting: Emergency Medicine

## 2022-01-29 ENCOUNTER — Emergency Department: Payer: Medicaid Other

## 2022-01-29 ENCOUNTER — Other Ambulatory Visit: Payer: Self-pay

## 2022-01-29 ENCOUNTER — Encounter: Payer: Self-pay | Admitting: Oncology

## 2022-01-29 DIAGNOSIS — G44309 Post-traumatic headache, unspecified, not intractable: Secondary | ICD-10-CM

## 2022-01-29 DIAGNOSIS — J069 Acute upper respiratory infection, unspecified: Secondary | ICD-10-CM

## 2022-01-29 DIAGNOSIS — J029 Acute pharyngitis, unspecified: Secondary | ICD-10-CM | POA: Diagnosis present

## 2022-01-29 DIAGNOSIS — E119 Type 2 diabetes mellitus without complications: Secondary | ICD-10-CM | POA: Diagnosis not present

## 2022-01-29 DIAGNOSIS — J45909 Unspecified asthma, uncomplicated: Secondary | ICD-10-CM | POA: Diagnosis not present

## 2022-01-29 DIAGNOSIS — I10 Essential (primary) hypertension: Secondary | ICD-10-CM | POA: Diagnosis not present

## 2022-01-29 DIAGNOSIS — Z20822 Contact with and (suspected) exposure to covid-19: Secondary | ICD-10-CM | POA: Insufficient documentation

## 2022-01-29 LAB — RESP PANEL BY RT-PCR (FLU A&B, COVID) ARPGX2
Influenza A by PCR: NEGATIVE
Influenza B by PCR: NEGATIVE
SARS Coronavirus 2 by RT PCR: NEGATIVE

## 2022-01-29 LAB — GROUP A STREP BY PCR: Group A Strep by PCR: NOT DETECTED

## 2022-01-29 MED ORDER — METOCLOPRAMIDE HCL 5 MG/ML IJ SOLN
10.0000 mg | Freq: Once | INTRAMUSCULAR | Status: AC
  Administered 2022-01-29: 10 mg via INTRAVENOUS
  Filled 2022-01-29: qty 2

## 2022-01-29 MED ORDER — SODIUM CHLORIDE 0.9 % IV BOLUS
1000.0000 mL | Freq: Once | INTRAVENOUS | Status: AC
  Administered 2022-01-29: 1000 mL via INTRAVENOUS

## 2022-01-29 MED ORDER — DIPHENHYDRAMINE HCL 50 MG/ML IJ SOLN
25.0000 mg | Freq: Once | INTRAMUSCULAR | Status: AC
Start: 2022-01-29 — End: 2022-01-29
  Administered 2022-01-29: 25 mg via INTRAVENOUS
  Filled 2022-01-29: qty 1

## 2022-01-29 MED ORDER — DEXAMETHASONE SODIUM PHOSPHATE 10 MG/ML IJ SOLN
10.0000 mg | Freq: Once | INTRAMUSCULAR | Status: AC
Start: 2022-01-29 — End: 2022-01-29
  Administered 2022-01-29: 10 mg via INTRAVENOUS
  Filled 2022-01-29: qty 1
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
## 2022-01-29 NOTE — ED Notes (Signed)
Pt reports her s/s have improved greatly

## 2022-01-29 NOTE — ED Triage Notes (Signed)
Pt states she has been having headaches, sore throat, and ear pain x2 weeks- pt also endorses fever, pt states it was 102.9 at home- pt last took tylenol around 0400

## 2022-02-23 ENCOUNTER — Ambulatory Visit
Admission: RE | Admit: 2022-02-23 | Discharge: 2022-02-23 | Disposition: A | Discharge status: Home / Self Care | Payer: Medicaid Other | Attending: Chiropractor | Admitting: Chiropractor

## 2022-02-23 ENCOUNTER — Encounter: Payer: Self-pay | Admitting: Oncology

## 2022-02-23 ENCOUNTER — Other Ambulatory Visit: Payer: Self-pay | Admitting: Chiropractor

## 2022-02-23 ENCOUNTER — Ambulatory Visit
Admission: RE | Admit: 2022-02-23 | Discharge: 2022-02-23 | Disposition: A | Discharge status: Home / Self Care | Payer: Medicaid Other | Source: Ambulatory Visit | Attending: Chiropractor | Admitting: Chiropractor

## 2022-02-23 DIAGNOSIS — S161XXA Strain of muscle, fascia and tendon at neck level, initial encounter: Secondary | ICD-10-CM | POA: Diagnosis present

## 2022-02-23 DIAGNOSIS — S29019A Strain of muscle and tendon of unspecified wall of thorax, initial encounter: Secondary | ICD-10-CM

## 2022-02-23 DIAGNOSIS — S39012A Strain of muscle, fascia and tendon of lower back, initial encounter: Secondary | ICD-10-CM

## 2022-07-11 ENCOUNTER — Encounter: Payer: Self-pay | Admitting: Oncology

## 2022-07-13 ENCOUNTER — Ambulatory Visit (INDEPENDENT_AMBULATORY_CARE_PROVIDER_SITE_OTHER): Payer: Medicaid Other | Admitting: Surgery

## 2022-07-13 ENCOUNTER — Encounter: Payer: Self-pay | Admitting: Surgery

## 2022-07-13 VITALS — BP 146/87 | HR 83 | Temp 98.0°F | Ht 64.0 in | Wt 252.0 lb

## 2022-07-13 DIAGNOSIS — N644 Mastodynia: Secondary | ICD-10-CM

## 2022-07-13 DIAGNOSIS — R591 Generalized enlarged lymph nodes: Secondary | ICD-10-CM | POA: Diagnosis not present

## 2022-07-13 NOTE — Patient Instructions (Addendum)
You may want to try wearing a well fitting sports bra to see if this helps with the pain.  We will have you get mammograms of the breast with ultrasound of the left. You will follow up here after we get the results.   [place] will get your previous images from [facility] and will call you to schedule the mammogram once they obtain these.  Once you are scheduled for your mammogram please call our office to schedule your follow up visit with Dr Hampton Abbot.

## 2022-07-15 ENCOUNTER — Encounter: Payer: Self-pay | Admitting: Surgery

## 2022-07-15 NOTE — Progress Notes (Signed)
07/13/2022  Reason for Visit: Left breast pain  Requesting Provider:  Mia Creek, MD  History of Present Illness: Lauren [name] is a 46 y.o. female presenting for evaluation of left breast pain.  Patient reports that this has been ongoing for at least 6 months.  Reports that the pain is in the lateral aspect of the left breast.  Reports that she is unable to lie on her left side because of the discomfort.  Also if she lies on her back, there is discomfort at the lateral portion of the breast.  This pain is constant and is not affected by menstrual cycles.  She reports 1 episode of white drainage from the nipple but that was a couple of months back.  The patient has not had any prior biopsies in the past.  There is a history of breast cancer in her maternal aunt.  She started her menstrual cycle at age 46, has had 57 pregnancies with 2 miscarriages and 3 abortions.  Age of her first pregnancy was 16 years.  No breast-feeding.  She had a bilateral screening mammogram on 07/08/2021 which was overall negative for malignancy.  She also had a left axillary ultrasound on 12/22/2021 which showed prominent lymph nodes but not pathologically enlarged.  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 prior report of SVT req adenosine;  b. 2011 Holter @ [facility] reportedly showed "irregular heartbeat"; c. 05/2016 Holter: "basically unremarkable" per Dr. Clayborn Bigness;  d. 07/2016 Echo: EF 50-55%. Nl RV fxn.   Wrist  fracture, closed    bilateral     Past Surgical History: Past Surgical History:  Procedure Laterality Date   CARDIAC ELECTROPHYSIOLOGY STUDY AND ABLATION  01/18/2018   CESAREAN SECTION  2010   CESAREAN SECTION  2011   CHOLECYSTECTOMY     EXCISION MASS NECK Left 10/11/2018   Procedure: REMOVAL OF LEFT NECK LUMP;  Surgeon: Benjamine Sprague, DO;  Location: [facility] ORS;  Service: General;  Laterality: Left;   KNEE SURGERY Right    ROTATOR CUFF REPAIR Right    TONSILLECTOMY      Home Medications: Prior to Admission medications   Medication Sig Start Date End Date Taking? Authorizing Provider  amLODipine (NORVASC) 5 MG tablet Take 5 mg by mouth daily. 06/11/20  Yes [provider]  aspirin EC 81 MG tablet Take 81 mg by mouth daily.   Yes [provider]  beclomethasone (QVAR) 80 MCG/ACT inhaler Inhale into the lungs. 07/12/18  Yes [provider]  butalbital-acetaminophen-caffeine (FIORICET) 50-325-40 MG tablet Take by mouth 2 (two) times daily as needed for headache. PRN as needed twice a day   Yes [provider]  celecoxib (CELEBREX) 200 MG capsule Take 200 mg by mouth 2 (two) times daily. 11/21/20  Yes [provider]  diltiazem (TIAZAC) 180 MG 24 hr capsule Take by mouth.   Yes [provider]  metoprolol succinate (TOPROL-XL) 100 MG 24 hr tablet Take 100 mg by mouth daily. Take with or immediately following a meal.   Yes [provider]  RESTASIS MULTIDOSE 0.05 % ophthalmic emulsion SMARTSIG:1 Drop(s) In Eye(s) Every 12 Hours 06/09/20  Yes [provider]  STIOLTO RESPIMAT 2.5-2.5 MCG/ACT AERS Inhale 2 puffs into the lungs daily. 07/06/18  Yes Poulose, Bethel Born, NP  SUMAtriptan (IMITREX) 100 MG tablet Take 100 mg by mouth every 2 (two) hours as needed for migraine. May repeat in 2 hours if headache persists or recurs.   Yes [provider]  fluticasone (FLOVENT HFA) 220 MCG/ACT inhaler Inhale 2 puffs into the  lungs 2 (two) times daily. 07/20/18 05/21/20  Flora Lipps, MD  pregabalin (LYRICA) 150 MG capsule Take 1 capsule (150 mg total) by mouth 3 (three) times daily. 06/06/20 09/04/20  Milinda Pointer, MD  albuterol (PROVENTIL HFA;VENTOLIN HFA) 108 (90 Base) MCG/ACT inhaler Inhale 2 puffs into the lungs every 6 (six) hours as needed for wheezing or shortness of breath. 07/06/18 07/06/20  Poulose, Bethel Born, NP  esomeprazole (NEXIUM) 40 MG capsule Take 1 capsule (40 mg total) by mouth daily at 12 noon. 07/06/18 07/06/20  Poulose, Bethel Born, NP  ferrous sulfate 324 MG TBEC Take by mouth. 10/18/16 12/05/20  [provider]  HYDROcodone-acetaminophen (NORCO/VICODIN) 5-325 MG tablet Take 1 tablet by mouth every 8 (eight) hours as needed for severe pain. Must last 30 days 09/18/20 09/30/20  Milinda Pointer, MD    Allergies: Allergies  Allergen Reactions   Nucynta [Tapentadol] Hives and Swelling    Swelling of the throat    Tape     Paper tape and Tegaderm ok    Social History:  reports that she has never smoked. She has never been exposed to tobacco smoke. She has never used smokeless tobacco. She reports that she does not drink alcohol and does not use drugs.   Family History: Family History  Problem Relation Age of Onset   Diabetes Mother    Diabetes Father    Migraines Father    Diabetes Sister    Diabetes Sister    Stomach cancer Sister    Diabetes Sister    Diabetes Sister    Diabetes Sister    Diabetes Sister    Hypertension Brother    Coronary artery disease Brother    Hypertension Brother    Diabetes Brother    Migraines Maternal Aunt    Throat cancer Maternal Aunt    Breast cancer Maternal Aunt    Migraines Maternal Uncle    Migraines Paternal Aunt    Migraines Paternal Uncle    Ovarian cancer Cousin     Review of Systems: Review of Systems  Constitutional:  Negative for chills and fever.  Respiratory:  Negative for shortness of breath.   Cardiovascular:   Negative for chest pain.  Gastrointestinal:  Negative for abdominal pain, nausea and vomiting.  Genitourinary:  Negative for dysuria.  Musculoskeletal:  Negative for myalgias.  Skin:  Negative for rash.    Physical Exam BP (!) 146/87   Pulse 83   Temp 98 F (36.7 C)   Ht '5\' 4"'$  (1.626 m)   Wt 252 lb (114.3 kg)   LMP 06/19/2022 (Exact Date)   SpO2 98%   BMI 43.26 kg/m  CONSTITUTIONAL: No acute distress HEENT:  Normocephalic, atraumatic, extraocular motion intact. RESPIRATORY:  Normal respiratory effort without pathologic use of accessory muscles. CARDIOVASCULAR: Regular rhythm and rate. BREAST: Right breast without  any palpable masses, skin changes, or nipple changes.  There is some small palpable lymph nodes in the right axilla but these are normal in size and without any tenderness to palpation.  Left breast without any palpable masses, skin changes, or nipple changes.  No active drainage at this point.  She does have tenderness in the lateral portion of the right breast going to the anterior axillary line.  She also has small but palpable lymph nodes in the left axilla but also these are normal in size and without any tenderness to palpation. MUSCULOSKELETAL:  Normal muscle strength and tone in all four extremities.  No peripheral edema or cyanosis. NEUROLOGIC:  Motor and sensation is grossly normal.  Cranial nerves are grossly intact. PSYCH:  Alert and oriented to person, place and time. Affect is normal.  Imaging: Bilateral screening mammogram on 07/08/2021: FINDINGS:  There is no suspicious mass, calcifications, or other abnormality in either breast.   IMPRESSION:  No mammographic evidence of malignancy.   ASSESSMENT:   BI-RADS Category: 1-Mammo1Yr : Negative.  Mammogram due in 1 year.   Left axillary ultrasound on 12/22/2021: FINDINGS:  Prominent lymph nodes are seen bilaterally. No pathologically enlarged lymph nodes.   IMPRESSION:  -Prominent axillary lymph nodes are  visualized bilaterally. No pathologically enlarged lymph nodes.   Assessment and Plan: This is a 46 y.o. female with left breast pain.  - Discussed with the patient that although I am able to feel lymph nodes in both axillas, these are small nontender.  The patient is overall due for repeat mammogram and given the pain symptoms, will order bilateral diagnostic mammogram for further evaluation.  She may end up needing ultrasound of the axilla as well given the prominent lymph nodes found on 12/22/2021. - Patient will follow-up with me after her imaging studies are done to discuss the results.  Discussed with her that if the results warrant any biopsies, we will postpone the appointment until after the biopsy is done that we can discuss things more thoroughly. - All of her questions have been answered.  I spent 30 minutes dedicated to the care of this patient on the date of this encounter to include pre-visit review of records, face-to-face time with the patient discussing diagnosis and management, and any post-visit coordination of care.   Melvyn Neth, [facility]

## 2022-07-27 ENCOUNTER — Inpatient Hospital Stay
Admission: RE | Admit: 2022-07-27 | Discharge: 2022-07-27 | Disposition: A | Discharge status: Home / Self Care | Payer: Self-pay | Source: Ambulatory Visit | Attending: *Deleted | Admitting: *Deleted

## 2022-07-27 ENCOUNTER — Other Ambulatory Visit: Payer: Self-pay | Admitting: *Deleted

## 2022-07-27 DIAGNOSIS — Z1231 Encounter for screening mammogram for malignant neoplasm of breast: Secondary | ICD-10-CM

## 2022-08-16 ENCOUNTER — Other Ambulatory Visit: Payer: Medicaid Other

## 2022-08-24 ENCOUNTER — Ambulatory Visit: Payer: Medicaid Other | Admitting: Surgery

## 2022-09-01 ENCOUNTER — Inpatient Hospital Stay: Admission: RE | Admit: 2022-09-01 | Payer: Medicaid Other | Source: Ambulatory Visit

## 2022-09-07 ENCOUNTER — Ambulatory Visit: Payer: Medicaid Other | Admitting: Surgery

## 2022-12-12 ENCOUNTER — Encounter: Payer: Self-pay | Admitting: Oncology

## 2022-12-19 ENCOUNTER — Ambulatory Visit (INDEPENDENT_AMBULATORY_CARE_PROVIDER_SITE_OTHER): Payer: Medicaid Other | Admitting: Psychiatry

## 2022-12-19 ENCOUNTER — Encounter: Payer: Self-pay | Admitting: Psychiatry

## 2022-12-19 VITALS — BP 125/74 | HR 92 | Temp 98.1°F | Ht 64.0 in | Wt 241.4 lb

## 2022-12-19 DIAGNOSIS — F39 Unspecified mood [affective] disorder: Secondary | ICD-10-CM | POA: Diagnosis not present

## 2022-12-19 DIAGNOSIS — Z79899 Other long term (current) drug therapy: Secondary | ICD-10-CM | POA: Diagnosis not present

## 2022-12-19 DIAGNOSIS — F1021 Alcohol dependence, in remission: Secondary | ICD-10-CM

## 2022-12-19 DIAGNOSIS — F4310 Post-traumatic stress disorder, unspecified: Secondary | ICD-10-CM | POA: Diagnosis not present

## 2022-12-19 MED ORDER — DIVALPROEX SODIUM 250 MG PO DR TAB: 750.0000 mg | DELAYED_RELEASE_TABLET | ORAL | 0 refills | Status: AC

## 2022-12-19 NOTE — Patient Instructions (Addendum)
[email or web address]  [email or web address]  [facility] [email or web address] [address], [place], [place] [address]  [phone]  [facility] [email or web address] [address], [place], [place] [address]   [phone]   [facility] - [phone]  [facility] - [phone]  [facility] - [phone]   [facility] [phone]  [facility] [phone]- [phone]      Divalproex Sodium Delayed- or Extended-Release Tablets What is this medication? DIVALPROEX SODIUM (dye VAL pro ex SO dee um) prevents and controls seizures in people with epilepsy. It may also be used to prevent migraine headaches. It can also be used to treat bipolar disorder. It works by calming overactive nerves in your body. This medicine may be used for other purposes; ask your health care provider or pharmacist if you have questions. COMMON BRAND NAME(S): Depakote, Depakote ER What should I tell my care team before I take this medication? They need to know if you have any of these conditions: Frequently drink alcohol Kidney disease Liver disease Low platelet counts Mitochondrial disease Suicidal thoughts, plans, or attempt by you or a family member Urea cycle disorder (UCD) An unusual or allergic reaction to divalproex sodium, sodium valproate, valproic acid, other medications, foods, dyes, or preservatives Pregnant or trying to get pregnant Breast-feeding How should I use this medication? Take this medication by mouth with a drink of water. Follow the directions on the prescription label. Do not cut, crush or chew this medication. You can take it with or without food. If it upsets your stomach, take it with food. Take your medication at regular intervals. Do not take it more often than directed. Do not stop taking except on your care team's advice. A special MedGuide will be given to you by the  pharmacist with each prescription and refill. Be sure to read this information carefully each time. Talk to your care team about the use of this medication in children. While this medication may be prescribed for children as young as 10 years for selected conditions, precautions do apply. Overdosage: If you think you have taken too much of this medicine contact a poison control center or emergency room at once. NOTE: This medicine is only for you. Do not share this medicine with others. What if I miss a dose? If you miss a dose, take it as soon as you can. If it is almost time for your next dose, take only that dose. Do not take double or extra doses. What may interact with this medication? Do not take this medication with any of the following: Sodium phenylbutyrate This medication may also interact with the following: Aspirin Certain antibiotics, such as ertapenem, imipenem, meropenem Certain medications for depression, anxiety, or other mental health conditions Certain medications for seizures, such as cannabidiol, carbamazepine, clonazepam, diazepam, ethosuximide, felbamate, lamotrigine, phenobarbital, phenytoin, primidone, rufinamide, topiramate Certain medications that treat or prevent blood clots, such as warfarin Cholestyramine Estrogen and progestin hormones Methotrexate Propofol Rifampin Ritonavir Tolbutamide Zidovudine This list may not describe all possible interactions. Give your health care provider a list of all the medicines, herbs, non-prescription drugs, or dietary supplements you use. Also tell them if you smoke, drink alcohol, or use illegal drugs. Some items may interact with your medicine. What should I watch for while using this medication? Tell your care team if your symptoms do not get better or they start to get worse. This medication may cause serious skin reactions. They can  happen weeks to months after starting the medication. Contact your care team right away if  you notice fevers or flu-like symptoms with a rash. The rash may be red or purple and then turn into blisters or peeling of the skin. Or, you might notice a red rash with swelling of the face, lips or lymph nodes in your neck or under your arms. Wear a medical ID bracelet or chain, and carry a card that describes your disease and details of your medication and dosage times. You may get drowsy, dizzy, or have blurred vision. Do not drive, use machinery, or do anything that needs mental alertness until you know how this medication affects you. To reduce dizzy or fainting spells, do not sit or stand up quickly, especially if you are an older patient. Alcohol can increase drowsiness and dizziness. Avoid alcoholic drinks. This medication can make you more sensitive to the sun. Keep out of the sun. If you cannot avoid being in the sun, wear protective clothing and use sunscreen. Do not use sun lamps or tanning beds/booths. Patients and their families should watch out for new or worsening depression or thoughts of suicide. Also watch out for sudden changes in feelings such as feeling anxious, agitated, panicky, irritable, hostile, aggressive, impulsive, severely restless, overly excited and hyperactive, or not being able to sleep. If this happens, especially at the beginning of treatment or after a change in dose, call your care team. Women should inform their care team if they wish to become pregnant or think they might be pregnant. There is a potential for serious side effects to an unborn child. Talk to your care team or pharmacist for more information. Women who become pregnant while using this medication may enroll in the [place] American Antiepileptic Drug Pregnancy Registry by calling ([phone]. This registry collects information about the safety of antiepileptic medication use during pregnancy. This medication may cause a decrease in folic acid and vitamin D. You should make sure that you get enough  vitamins while you are taking this medication. Discuss the foods you eat and the vitamins you take with your care team. What side effects may I notice from receiving this medication? Side effects that you should report to your care team as soon as possible: Allergic reactions--skin rash, itching, hives, swelling of the face, lips, tongue, or throat High ammonia level--unusual weakness or fatigue, confusion, loss of appetite, nausea, vomiting, seizures Liver injury--right upper belly pain, loss of appetite, nausea, light-colored stool, dark yellow or brown urine, yellowing skin or eyes, unusual weakness or fatigue Low body temperature, drowsiness, confusion Pancreatitis--severe stomach pain that spreads to your back or gets worse after eating or when touched, fever, nausea, vomiting Rash, fever, and swollen lymph nodes Thoughts of suicide or self-harm, worsening mood, feelings of depression Unusual bruising or bleeding Side effects that usually do not require medical attention (report to your care team if they continue or are bothersome): Change in vision Dizziness Drowsiness Hair loss Headache Nausea Tremors or shaking Weight gain This list may not describe all possible side effects. Call your doctor for medical advice about side effects. You may report side effects to FDA at 1-800-FDA-1088. Where should I keep my medication? Keep out of reach of children and pets. Store at room temperature between 15 and 30 degrees C (59 and 86 degrees F). Keep container tightly closed. Throw away any unused medication after the expiration date. NOTE: This sheet is a summary. It may not cover all possible information. If you  have questions about this medicine, talk to your doctor, pharmacist, or health care provider.  2023 Elsevier/Gold Standard (2021-12-08 00:00:00)
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
## 2022-12-20 DIAGNOSIS — F1021 Alcohol dependence, in remission: Secondary | ICD-10-CM | POA: Insufficient documentation

## 2023-01-03 ENCOUNTER — Ambulatory Visit
Admission: RE | Admit: 2023-01-03 | Discharge: 2023-01-03 | Disposition: A | Discharge status: Home / Self Care | Payer: Medicaid Other | Source: Ambulatory Visit | Attending: Surgery | Admitting: Surgery

## 2023-01-03 ENCOUNTER — Encounter: Payer: Self-pay | Admitting: Radiology

## 2023-01-03 DIAGNOSIS — N644 Mastodynia: Secondary | ICD-10-CM

## 2023-01-03 DIAGNOSIS — R591 Generalized enlarged lymph nodes: Secondary | ICD-10-CM | POA: Insufficient documentation

## 2023-01-04 ENCOUNTER — Telehealth: Payer: Self-pay

## 2023-01-04 NOTE — Telephone Encounter (Signed)
-----   Message from Ilda Foil sent at 01/04/2023 11:17 AM EDT ----- Regarding: FW: Mammogram results  ----- Message ----- From: Henrene Dodge, MD Sent: 01/04/2023  11:07 AM EDT To: [facility] Subject: Mammogram results                              Hi,  I saw this patient back in November for left breast pain.  Mammogram was ordered then but looks like she's No Showed to some appointments or canceled others.  Her mammogram was just done yesterday and it's all negative.  Can y'all please contact her to inform her of the results?  There's nothing on imaging that would be causing her pain.  If she's still having pain issues, she could always try Evening Primrose Oil.  No follow up needed.  Thanks!  [employer]

## 2023-01-04 NOTE — Telephone Encounter (Signed)
Notified patient as instructed, patient pleased. Patient aware she may follow up as needed.

## 2023-01-26 ENCOUNTER — Ambulatory Visit: Payer: Medicaid Other | Admitting: Psychiatry

## 2023-02-01 ENCOUNTER — Ambulatory Visit: Payer: Medicaid Other | Admitting: Psychiatry

## 2023-03-29 ENCOUNTER — Ambulatory Visit: Payer: Medicaid Other | Admitting: Psychiatry

## 2023-08-16 ENCOUNTER — Ambulatory Visit: Payer: Medicaid Other | Admitting: Dermatology

## 2023-10-11 ENCOUNTER — Encounter: Payer: Self-pay | Admitting: Family Medicine

## 2023-10-11 ENCOUNTER — Ambulatory Visit (INDEPENDENT_AMBULATORY_CARE_PROVIDER_SITE_OTHER): Payer: Medicaid Other | Admitting: Family Medicine

## 2023-10-11 VITALS — BP 122/82 | HR 87 | Temp 98.0°F | Resp 16 | Ht 66.0 in | Wt 224.2 lb

## 2023-10-11 DIAGNOSIS — F5101 Primary insomnia: Secondary | ICD-10-CM

## 2023-10-11 DIAGNOSIS — N3000 Acute cystitis without hematuria: Secondary | ICD-10-CM

## 2023-10-11 DIAGNOSIS — Z23 Encounter for immunization: Secondary | ICD-10-CM | POA: Diagnosis not present

## 2023-10-11 DIAGNOSIS — N3941 Urge incontinence: Secondary | ICD-10-CM | POA: Diagnosis not present

## 2023-10-11 DIAGNOSIS — J209 Acute bronchitis, unspecified: Secondary | ICD-10-CM

## 2023-10-11 DIAGNOSIS — I471 Supraventricular tachycardia, unspecified: Secondary | ICD-10-CM

## 2023-10-11 DIAGNOSIS — N938 Other specified abnormal uterine and vaginal bleeding: Secondary | ICD-10-CM

## 2023-10-11 DIAGNOSIS — D649 Anemia, unspecified: Secondary | ICD-10-CM

## 2023-10-11 DIAGNOSIS — E876 Hypokalemia: Secondary | ICD-10-CM

## 2023-10-11 DIAGNOSIS — F419 Anxiety disorder, unspecified: Secondary | ICD-10-CM

## 2023-10-11 DIAGNOSIS — I10 Essential (primary) hypertension: Secondary | ICD-10-CM

## 2023-10-11 DIAGNOSIS — R7309 Other abnormal glucose: Secondary | ICD-10-CM

## 2023-10-11 DIAGNOSIS — D500 Iron deficiency anemia secondary to blood loss (chronic): Secondary | ICD-10-CM

## 2023-10-11 DIAGNOSIS — G894 Chronic pain syndrome: Secondary | ICD-10-CM

## 2023-10-11 DIAGNOSIS — G43E09 Chronic migraine with aura, not intractable, without status migrainosus: Secondary | ICD-10-CM | POA: Insufficient documentation

## 2023-10-11 LAB — POCT URINALYSIS DIP (CLINITEK)
Bilirubin, UA: NEGATIVE
Blood, UA: NEGATIVE
Glucose, UA: NEGATIVE mg/dL
Ketones, POC UA: NEGATIVE mg/dL
Nitrite, UA: POSITIVE — AB
Spec Grav, UA: >=1.03 — AB (ref 1.010–1.025)
Urobilinogen, UA: 1 U/dL
pH, UA: 6 (ref 5.0–8.0)

## 2023-10-11 MED ORDER — DILTIAZEM HCL ER COATED BEADS 180 MG PO CP24
180.0000 mg | ORAL_CAPSULE | Freq: Every day | ORAL | 1 refills | Status: AC
Start: 2023-10-11 — End: ?

## 2023-10-11 MED ORDER — METOPROLOL SUCCINATE ER 100 MG PO TB24
100.0000 mg | ORAL_TABLET | Freq: Every day | ORAL | 3 refills | Status: AC
Start: 2023-10-11 — End: ?

## 2023-10-11 MED ORDER — STIOLTO RESPIMAT 2.5-2.5 MCG/ACT IN AERS
2.0000 | INHALATION_SPRAY | Freq: Every day | RESPIRATORY_TRACT | 11 refills | Status: AC
Start: 2023-10-11 — End: ?

## 2023-10-11 MED ORDER — DULOXETINE HCL 30 MG PO CPEP
30.0000 mg | ORAL_CAPSULE | Freq: Every day | ORAL | 3 refills | Status: AC
Start: 2023-10-11 — End: ?

## 2023-10-11 MED ORDER — MEDROXYPROGESTERONE ACETATE 150 MG/ML IM SUSP
150.0000 mg | Freq: Once | INTRAMUSCULAR | 0 refills | Status: AC
Start: 2023-10-11 — End: 2024-05-10

## 2023-10-11 MED ORDER — POTASSIUM CHLORIDE CRYS ER 20 MEQ PO TBCR
20.0000 meq | EXTENDED_RELEASE_TABLET | Freq: Every day | ORAL | 3 refills | Status: AC
Start: 2023-10-11 — End: ?

## 2023-10-11 MED ORDER — QUETIAPINE FUMARATE 50 MG PO TABS: 50.0000 mg | ORAL_TABLET | Freq: Every day | ORAL | 3 refills | Status: DC

## 2023-10-11 MED ORDER — OXYBUTYNIN CHLORIDE ER 5 MG PO TB24
5.0000 mg | ORAL_TABLET | Freq: Every day | ORAL | 3 refills | Status: AC
Start: 2023-10-11 — End: ?

## 2023-10-11 MED ORDER — SUMATRIPTAN SUCCINATE 50 MG PO TABS
50.0000 mg | ORAL_TABLET | ORAL | 3 refills | Status: AC | PRN
Start: 2023-10-11 — End: ?

## 2023-10-11 MED ORDER — LISINOPRIL 20 MG PO TABS
20.0000 mg | ORAL_TABLET | Freq: Every day | ORAL | 3 refills | Status: AC
Start: 2023-10-11 — End: ?

## 2023-10-11 MED ORDER — AMLODIPINE BESYLATE 5 MG PO TABS
5.0000 mg | ORAL_TABLET | Freq: Every day | ORAL | 1 refills | Status: AC
Start: 2023-10-11 — End: ?

## 2023-10-11 MED ORDER — METFORMIN HCL 1000 MG PO TABS
1000.0000 mg | ORAL_TABLET | Freq: Two times a day (BID) | ORAL | 3 refills | Status: AC
Start: 2023-10-11 — End: ?

## 2023-10-11 MED ORDER — TIRZEPATIDE 2.5 MG/0.5ML ~~LOC~~ SOAJ
2.5000 mg | SUBCUTANEOUS | 0 refills | Status: DC
Start: 2023-10-11 — End: 2023-10-20

## 2023-10-11 NOTE — Progress Notes (Unsigned)
Established Patient Office Visit  Subjective   Patient ID: Lauren [name], female    DOB: 05-13-1976  Age: 48 y.o. MRN: [id number]  Chief Complaint  Patient presents with  . Establish Care  . Medical Management of Chronic Issues    HPI Lauren [name] is a pleasant 49 year old with type 2 diabetes, s/p gastric bypass, iron deficiency anemia secondary to chronic blood loss, anxiety, on Depo Provera for contraception, migraine headaches, and multiple pain syndromes. She got her last Depo-Provera [facility] so her next one is due March 28.  Asked her to get her Depo-Provera filled and bring it here and will give it to her. She has been out of most of her medications for couple of months.  She is not taking any Mounjaro or metformin.  Reports fasting blood sugar was 131 today despite being off her medicine.  She had a couple episodes where she got sweaty and shaky she drank some orange juice and felt better. She still having pain in her left breast.  She has got a normal mammogram and ultrasound and she has followed with a breast surgeon.  She takes daily narcotics for her back and is in a pain contract. PHQ-9-5 GAD-7-15.She is off her Cymbalta.   She has not had a Pap smear in a number of years.    ROS    Objective:     BP 122/82 (BP Location: Right Arm, Patient Position: Sitting, Cuff Size: Large)   Pulse 87   Temp 98 F (36.7 C) (Oral)   Resp 16   Ht 5\' 6"  (1.676 m) Comment: per chart  Wt 224 lb 3.2 oz (101.7 kg)   SpO2 97%   BMI 36.19 kg/m    Physical Exam Vitals and nursing note reviewed.  Constitutional:      Appearance: Normal appearance.  HENT:     Head: Normocephalic and atraumatic.  Eyes:     Conjunctiva/sclera: Conjunctivae normal.  Cardiovascular:     Rate and Rhythm: Normal rate and regular rhythm.  Pulmonary:     Effort: Pulmonary effort is normal.     Breath sounds: Normal breath sounds.  Musculoskeletal:     Right lower leg: No edema.     Left  lower leg: No edema.  Skin:    General: Skin is warm and dry.  Neurological:     Mental Status: She is alert and oriented to person, place, and time.  Psychiatric:        Mood and Affect: Mood normal.        Behavior: Behavior normal.        Thought Content: Thought content normal.        Judgment: Judgment normal.         Results for orders placed or performed in visit on 10/11/23  POCT URINALYSIS DIP (CLINITEK)  Result Value Ref Range   Color, UA yellow yellow   Clarity, UA cloudy (A) clear   Glucose, UA negative negative mg/dL   Bilirubin, UA negative negative   Ketones, POC UA negative negative mg/dL   Spec Grav, UA >=8.657 (A) 1.010 - 1.025   Blood, UA negative negative   pH, UA 6.0 5.0 - 8.0   POC PROTEIN,UA trace negative, trace   Urobilinogen, UA 1.0 0.2 or 1.0 E.U./dL   Nitrite, UA Positive (A) Negative   Leukocytes, UA Trace (A) Negative      The ASCVD Risk score (Arnett DK, et al., 2019) failed to calculate for the  following reasons:   Cannot find a previous HDL lab   Cannot find a previous total cholesterol lab    Assessment & Plan:  Chronic pain syndrome  Hypertension, unspecified type Assessment & Plan: She is on metoprolol succinate 100 mg and there, lisinopril 20 mg daily and diltiazem 180 once daily.  Refilled these today.  Urine microalbumin creatinine ratio and CMP  Orders: -     Lipid panel -     amLODIPine Besylate; Take 1 tablet (5 mg total) by mouth daily.  Dispense: 90 tablet; Refill: 1 -     dilTIAZem HCl ER Coated Beads; Take 1 capsule (180 mg total) by mouth daily.  Dispense: 90 capsule; Refill: 1 -     Lisinopril; Take 1 tablet (20 mg total) by mouth daily.  Dispense: 90 tablet; Refill: 3 -     Metoprolol Succinate ER; Take 1 tablet (100 mg total) by mouth daily. Take with or immediately following a meal.  Dispense: 90 tablet; Refill: 3  Elevated glucose level -     Hemoglobin A1c -     Microalbumin / creatinine urine ratio -      metFORMIN HCl; Take 1 tablet (1,000 mg total) by mouth 2 (two) times daily with a meal.  Dispense: 180 tablet; Refill: 3 -     Tirzepatide; Inject 2.5 mg into the skin once a week.  Dispense: 2 mL; Refill: 0  DUB (dysfunctional uterine bleeding) -     medroxyPROGESTERone Acetate; Inject 1 mL (150 mg total) into the muscle once for 1 dose.  Dispense: 1 mL; Refill: 0  Urge incontinence of urine -     oxyBUTYnin Chloride ER; Take 1 tablet (5 mg total) by mouth daily.  Dispense: 90 tablet; Refill: 3  Hypokalemia due to loss of potassium -     Potassium Chloride Crys ER; Take 1 tablet (20 mEq total) by mouth daily.  Dispense: 30 tablet; Refill: 3  Acute bronchitis with COPD (HCC) -     Stiolto Respimat; Inhale 2 puffs into the lungs daily.  Dispense: 1 each; Refill: 11  Chronic migraine with aura without status migrainosus, not intractable -     SUMAtriptan Succinate; Take 1 tablet (50 mg total) by mouth every 2 (two) hours as needed for migraine. May repeat in 2 hours if headache persists or recurs.  Dispense: 10 tablet; Refill: 3  Iron deficiency anemia due to chronic blood loss Assessment & Plan: Ferritin, iron saturation, CBC.  May have to refer to hematology for iron infusion.  Orders: -     Ambulatory referral to Hematology / Oncology  Anemia, unspecified type Assessment & Plan: Has a history of iron deficiency anemia from chronic blood loss last iron saturation was 3%.  States that she takes 2 iron 325 mg tablets a day and it does not seem to affect her blood work.  Referral to hematology for iron infusion.   Acute cystitis without hematuria -     Urine Culture -     POCT URINALYSIS DIP (CLINITEK)  SVT (supraventricular tachycardia) (HCC) Assessment & Plan: Restart her diltiazem 181 daily   Primary insomnia  Anxiety -     DULoxetine HCl; Take 1 capsule (30 mg total) by mouth daily.  Dispense: 30 capsule; Refill: 3  Need for immunization against influenza -     Flu  vaccine trivalent PF, 6mos and older(Flulaval,Afluria,Fluarix,Fluzone)  Other orders -     QUEtiapine Fumarate; Take 1 tablet (50 mg total) by mouth at  bedtime.  Dispense: 30 tablet; Refill: 3     Return in about 3 months (around 01/09/2024).    Lauren Medina, MD

## 2023-10-11 NOTE — Assessment & Plan Note (Signed)
Has a history of iron deficiency anemia from chronic blood loss last iron saturation was 3%.  States that she takes 2 iron 325 mg tablets a day and it does not seem to affect her blood work.  Referral to hematology for iron infusion.

## 2023-10-11 NOTE — Assessment & Plan Note (Signed)
She is on metoprolol succinate 100 mg and there, lisinopril 20 mg daily and diltiazem 180 once daily.  Refilled these today.  Urine microalbumin creatinine ratio and CMP

## 2023-10-11 NOTE — Assessment & Plan Note (Signed)
Ferritin, iron saturation, CBC.  May have to refer to hematology for iron infusion.

## 2023-10-12 ENCOUNTER — Telehealth: Payer: Self-pay

## 2023-10-12 LAB — LIPID PANEL
Chol/HDL Ratio: 2.4 {ratio} (ref 0.0–4.4)
Cholesterol, Total: 138 mg/dL (ref 100–199)
HDL: 58 mg/dL (ref >39)
LDL Chol Calc (NIH): 65 mg/dL (ref 0–99)
Triglycerides: 76 mg/dL (ref 0–149)
VLDL Cholesterol Cal: 15 mg/dL (ref 5–40)

## 2023-10-12 LAB — MICROALBUMIN / CREATININE URINE RATIO
Creatinine, Urine: 252.3 mg/dL
Microalb/Creat Ratio: 5 mg/g{creat} (ref 0–29)
Microalbumin, Urine: 12.1 ug/mL

## 2023-10-12 LAB — HEMOGLOBIN A1C
Est. average glucose Bld gHb Est-mCnc: 108 mg/dL
Hgb A1c MFr Bld: 5.4 % (ref 4.8–5.6)

## 2023-10-12 MED ORDER — SULFAMETHOXAZOLE-TRIMETHOPRIM 800-160 MG PO TABS
1.0000 | ORAL_TABLET | Freq: Two times a day (BID) | ORAL | 0 refills | Status: DC
Start: 2023-10-12 — End: 2023-10-27

## 2023-10-12 MED ORDER — PHENAZOPYRIDINE HCL 200 MG PO TABS
200.0000 mg | ORAL_TABLET | Freq: Three times a day (TID) | ORAL | 0 refills | Status: AC
Start: 2023-10-12 — End: 2023-10-17

## 2023-10-12 NOTE — Telephone Encounter (Signed)
Copied from CRM [id number]. Topic: Clinical - Prescription Issue >> Oct 12, 2023 10:48 AM Patsy Lager T wrote: Reason for CRM: patient called said her tirzepatide [facility]) 2.5 MG/0.5ML Pen needs a prior [place]. Please f/u with insurance

## 2023-10-12 NOTE — Telephone Encounter (Signed)
Awaiting to complete PA through Cover My Meds until A1C has resulted as it is a required lab value for processing.

## 2023-10-12 NOTE — Assessment & Plan Note (Signed)
Restart her diltiazem 181 daily

## 2023-10-13 ENCOUNTER — Telehealth: Payer: Self-pay

## 2023-10-13 NOTE — Telephone Encounter (Signed)
PA initiated/denied for Mounjaro. A1C was 5.6 and patient did not meet criteria.

## 2023-10-15 LAB — URINE CULTURE

## 2023-10-16 ENCOUNTER — Inpatient Hospital Stay: Payer: Medicaid Other | Attending: Internal Medicine | Admitting: Internal Medicine

## 2023-10-16 ENCOUNTER — Inpatient Hospital Stay: Payer: Medicaid Other

## 2023-10-16 ENCOUNTER — Encounter: Payer: Self-pay | Admitting: Internal Medicine

## 2023-10-16 VITALS — BP 118/74 | Temp 97.9°F | Resp 18 | Wt 224.0 lb

## 2023-10-16 DIAGNOSIS — N920 Excessive and frequent menstruation with regular cycle: Secondary | ICD-10-CM | POA: Diagnosis not present

## 2023-10-16 DIAGNOSIS — Z8041 Family history of malignant neoplasm of ovary: Secondary | ICD-10-CM | POA: Diagnosis not present

## 2023-10-16 DIAGNOSIS — Z791 Long term (current) use of non-steroidal anti-inflammatories (NSAID): Secondary | ICD-10-CM | POA: Insufficient documentation

## 2023-10-16 DIAGNOSIS — Z9884 Bariatric surgery status: Secondary | ICD-10-CM | POA: Diagnosis not present

## 2023-10-16 DIAGNOSIS — Z7982 Long term (current) use of aspirin: Secondary | ICD-10-CM | POA: Diagnosis not present

## 2023-10-16 DIAGNOSIS — I471 Supraventricular tachycardia, unspecified: Secondary | ICD-10-CM | POA: Diagnosis not present

## 2023-10-16 DIAGNOSIS — D509 Iron deficiency anemia, unspecified: Secondary | ICD-10-CM | POA: Diagnosis present

## 2023-10-16 DIAGNOSIS — Z801 Family history of malignant neoplasm of trachea, bronchus and lung: Secondary | ICD-10-CM | POA: Insufficient documentation

## 2023-10-16 DIAGNOSIS — Z7951 Long term (current) use of inhaled steroids: Secondary | ICD-10-CM | POA: Insufficient documentation

## 2023-10-16 DIAGNOSIS — J45909 Unspecified asthma, uncomplicated: Secondary | ICD-10-CM | POA: Insufficient documentation

## 2023-10-16 DIAGNOSIS — I10 Essential (primary) hypertension: Secondary | ICD-10-CM | POA: Insufficient documentation

## 2023-10-16 DIAGNOSIS — Z793 Long term (current) use of hormonal contraceptives: Secondary | ICD-10-CM | POA: Insufficient documentation

## 2023-10-16 DIAGNOSIS — Z7985 Long-term (current) use of injectable non-insulin antidiabetic drugs: Secondary | ICD-10-CM | POA: Diagnosis not present

## 2023-10-16 DIAGNOSIS — Z803 Family history of malignant neoplasm of breast: Secondary | ICD-10-CM | POA: Insufficient documentation

## 2023-10-16 DIAGNOSIS — Z79899 Other long term (current) drug therapy: Secondary | ICD-10-CM | POA: Diagnosis not present

## 2023-10-16 DIAGNOSIS — K219 Gastro-esophageal reflux disease without esophagitis: Secondary | ICD-10-CM | POA: Diagnosis not present

## 2023-10-16 DIAGNOSIS — Z7984 Long term (current) use of oral hypoglycemic drugs: Secondary | ICD-10-CM | POA: Insufficient documentation

## 2023-10-16 DIAGNOSIS — E119 Type 2 diabetes mellitus without complications: Secondary | ICD-10-CM | POA: Diagnosis not present

## 2023-10-16 MED ORDER — FERROUS SULFATE 324 MG PO TBEC: 324.0000 mg | DELAYED_RELEASE_TABLET | Freq: Every day | ORAL | 0 refills | Status: AC

## 2023-10-16 NOTE — Progress Notes (Signed)
[facility]  [phone] [phone]  ID: Lauren [name] OB: 03/28/1976  MR#: [id number]  JOA#:[id number]  Patient Care Team: [name], MD as PCP - General (Family Medicine) Alwyn Pea, MD as Consulting Physician (Cardiology) Michaelyn Barter, MD as Consulting Physician (Oncology)  REFERRING PROVIDER: Dr. Girtha Rm  REASON FOR REFERRAL: Iron deficiency anemia  HPI: Lauren [name] is a 48 y.o. female with past medical history of iron deficiency anemia, GERD, gastric bypass surgery in 2020 referred to hematology for iron deficiency anemia.  She is on iron pills 3 times a day.  Reports heavy menstrual cycles on the first couple of days where she changes every 30 minutes to an hour.  Denies any bleeding in urine or stool.  History of gastric bypass surgery in 2020.  Uses NSAIDs every other day.  Has longstanding history of iron deficiency anemia.  Last seen Dr. Orlie Dakin in 2019 and was treated with IV Feraheme weekly x 2 doses.  Then her insurance lapsed and could not get more iron infusion.  Labs reviewed from 10/07/2023.  Hemoglobin 7.3, iron 11 saturation 3%.  No ferritin.  Reports maybe had colonoscopy and endoscopy with either [facility] or [facility] about 5 years ago.  Unable to find from Care Everywhere.  REVIEW OF SYSTEMS:   ROS  As per HPI. Otherwise, a complete review of systems is negative.  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 prior report of SVT req adenosine;  b. 2011 Holter @ [facility] reportedly showed "irregular heartbeat"; c. 05/2016 Holter: "basically unremarkable" per Dr. Juliann Pares;  d. 07/2016 Echo: EF 50-55%. Nl RV fxn.   Wrist fracture, closed    bilateral    PAST SURGICAL HISTORY: Past Surgical History:  Procedure Laterality Date   CARDIAC ELECTROPHYSIOLOGY STUDY AND ABLATION  01/18/2018   CESAREAN SECTION  2010   CESAREAN SECTION  2011   CHOLECYSTECTOMY     EXCISION MASS NECK Left 10/11/2018   Procedure: REMOVAL OF LEFT NECK LUMP;  Surgeon: Sung Amabile, DO;  Location: [facility] ORS;  Service: General;  Laterality: Left;   KNEE SURGERY Right    ROTATOR CUFF REPAIR Right    TONSILLECTOMY      FAMILY HISTORY: Family History  Problem Relation Age of Onset   Alcohol abuse Mother    Bipolar disorder Mother    Diabetes Mother    Diabetes Father    Migraines Father    Diabetes Sister    Diabetes Sister    Stomach cancer Sister    Diabetes Sister    Diabetes Sister    Diabetes Sister    Diabetes Sister    Alcohol abuse Brother    Hypertension Brother    Coronary artery disease Brother    Schizophrenia Brother    Alcohol abuse Brother    Bipolar disorder Brother    Hypertension Brother  Diabetes Brother    Post-traumatic stress disorder Brother    Migraines Maternal Aunt    Throat cancer Maternal Aunt    Breast cancer Maternal Aunt    Migraines Paternal Aunt    Migraines Maternal Uncle    Migraines Paternal Uncle    Ovarian cancer Cousin     HEALTH MAINTENANCE: Social History   Tobacco Use   Smoking status: Never    Passive exposure: Never   Smokeless tobacco: Never  Vaping Use   Vaping status: Never Used  Substance Use Topics   Alcohol use: Not Currently    Comment: occ.   Drug use: Not Currently    Comment: last used several months ago     Allergies  Allergen Reactions   Tapentadol Hives, Swelling, Other (See Comments) and Palpitations    Swelling of the  throat  Throat swelled  Swelling of the throat   Throat swelling  Throat swelled  Swelling of the throat   Throat swelled, Swelling of the throat , Throat swelling, Throat swelled, Swelling of the throat   Tape Other (See Comments)    Paper tape and Tegaderm ok  Other reaction(s): Other (See Comments)  Paper tape and Tegaderm ok  Paper tape and Tegaderm ok  Other reaction(s): Other (See Comments), Paper tape and Tegaderm ok, Paper tape and Tegaderm ok    Current Outpatient Medications  Medication Sig Dispense Refill   amLODipine (NORVASC) 5 MG tablet Take 1 tablet (5 mg total) by mouth daily. 90 tablet 1   aspirin EC 81 MG tablet Take 81 mg by mouth daily.     baclofen (LIORESAL) 10 MG tablet Take by mouth.     beclomethasone (QVAR) 80 MCG/ACT inhaler Inhale into the lungs.     butalbital-acetaminophen-caffeine (FIORICET) 50-325-40 MG tablet Take by mouth 2 (two) times daily as needed for headache. PRN as needed twice a day     celecoxib (CELEBREX) 200 MG capsule Take 200 mg by mouth 2 (two) times daily.     clindamycin (CLEOCIN T) 1 % SWAB Apply topically.     clobetasol (TEMOVATE) 0.05 % external solution APPLY TO AFFECTED AREA TWICE DAILY UNTIL IMPROVED OR FOR UP TO 2 WEEKS, WHICHEVER IS SOONER. BREAK FOR 1-2 WEEKS. RESTART AS NEEDED.     diltiazem (CARDIZEM CD) 180 MG 24 hr capsule Take 1 capsule (180 mg total) by mouth daily. 90 capsule 1   diltiazem (TIAZAC) 180 MG 24 hr capsule Take by mouth.     divalproex (DEPAKOTE) 250 MG DR tablet Take 3 tablets (750 mg total) by mouth as directed. Take 1 tablet daily at 8 AM and 2 tablets daily at bedtime 90 tablet 0   DULoxetine (CYMBALTA) 30 MG capsule Take 1 capsule (30 mg total) by mouth daily. 30 capsule 3   esomeprazole (NEXIUM) 40 MG capsule Take 40 mg by mouth daily.     fluticasone (FLONASE) 50 MCG/ACT nasal spray USE 1 TO 2 SPRAY(S) IN EACH NOSTRIL ONCE DAILY AS NEEDED     fluticasone (FLOVENT HFA) 220 MCG/ACT inhaler Inhale 2  puffs into the lungs 2 (two) times daily. 1 Inhaler 2   furosemide (LASIX) 40 MG tablet Take 40 mg by mouth daily.     ibuprofen (ADVIL) 800 MG tablet Take 800 mg by mouth every 8 (eight) hours as needed.     ketoconazole (NIZORAL) 2 % shampoo Apply topically.     lisinopril (ZESTRIL) 20 MG tablet Take 1 tablet (20 mg total) by mouth  daily. 90 tablet 3   medroxyPROGESTERone (DEPO-PROVERA) 150 MG/ML injection Inject 1 mL (150 mg total) into the muscle once for 1 dose. 1 mL 0   metFORMIN (GLUCOPHAGE) 1000 MG tablet Take 1 tablet (1,000 mg total) by mouth 2 (two) times daily with a meal. 180 tablet 3   metoprolol succinate (TOPROL-XL) 100 MG 24 hr tablet Take 1 tablet (100 mg total) by mouth daily. Take with or immediately following a meal. 90 tablet 3   nystatin cream (MYCOSTATIN) Apply topically 2 (two) times daily. (Patient not taking: Reported on 10/11/2023)     nystatin ointment (MYCOSTATIN) SMARTSIG:1.0 Topical Daily (Patient not taking: Reported on 10/11/2023)     nystatin ointment (MYCOSTATIN) Apply topically.     oxybutynin (DITROPAN-XL) 5 MG 24 hr tablet Take 1 tablet (5 mg total) by mouth daily. 90 tablet 3   Oxycodone HCl 20 MG TABS Take 1 tablet by mouth every 8 (eight) hours.     oxyCODONE-acetaminophen (PERCOCET) 10-325 MG tablet Take 1 tablet by mouth.     phenazopyridine (PYRIDIUM) 200 MG tablet Take 1 tablet (200 mg total) by mouth 3 (three) times daily for 5 days. 15 tablet 0   potassium chloride SA (KLOR-CON M) 20 MEQ tablet Take 1 tablet (20 mEq total) by mouth daily. 30 tablet 3   propranolol (INDERAL) 10 MG tablet Take by mouth.     QUEtiapine (SEROQUEL) 50 MG tablet Take 1 tablet (50 mg total) by mouth at bedtime. 30 tablet 3   RESTASIS MULTIDOSE 0.05 % ophthalmic emulsion SMARTSIG:1 Drop(s) In Eye(s) Every 12 Hours     sulfamethoxazole-trimethoprim (BACTRIM DS) 800-160 MG tablet Take 1 tablet by mouth 2 (two) times daily. 14 tablet 0   SUMAtriptan (IMITREX) 50 MG tablet Take 1  tablet (50 mg total) by mouth every 2 (two) hours as needed for migraine. May repeat in 2 hours if headache persists or recurs. 10 tablet 3   Tiotropium Bromide-Olodaterol (STIOLTO RESPIMAT) 2.5-2.5 MCG/ACT AERS Inhale 2 puffs into the lungs daily. 1 each 11   tirzepatide (MOUNJARO) 2.5 MG/0.5ML Pen Inject 2.5 mg into the skin once a week. 2 mL 0   triamcinolone ointment (KENALOG) 0.1 % Apply topically.     triamcinolone ointment (KENALOG) 0.1 % Apply topically.     ZTLIDO 1.8 % PTCH Apply topically.     No current facility-administered medications for this visit.    OBJECTIVE: Vitals:   10/16/23 1142  BP: 118/74  Resp: 18  Temp: 97.9 F (36.6 C)     Body mass index is 36.15 kg/m.      General: Well-developed, well-nourished, no acute distress. Eyes: Pink conjunctiva, anicteric sclera. HEENT: Normocephalic, moist mucous membranes, clear oropharnyx. Lungs: Clear to auscultation bilaterally. Heart: Regular rate and rhythm. No rubs, murmurs, or gallops. Abdomen: Soft, nontender, nondistended. No organomegaly noted, normoactive bowel sounds. Musculoskeletal: No edema, cyanosis, or clubbing. Neuro: Alert, answering all questions appropriately. Cranial nerves grossly intact. Skin: No rashes or petechiae noted. Psych: Normal affect. Lymphatics: No cervical, calvicular, axillary or inguinal LAD.   LAB RESULTS:  Lab Results  Component Value Date   NA 140 06/27/2019   K 3.8 06/27/2019   CL 103 06/27/2019   CO2 26 06/27/2019   GLUCOSE 101 (H) 06/27/2019   BUN 14 06/27/2019   CREATININE 0.65 06/27/2019   CALCIUM 9.0 06/27/2019   PROT 7.5 06/27/2019   ALBUMIN 3.9 06/27/2019   AST 14 (L) 06/27/2019   ALT 12 06/27/2019   ALKPHOS 51 06/27/2019  BILITOT 0.5 06/27/2019   GFRNONAA >60 06/27/2019   GFRAA >60 06/27/2019    Lab Results  Component Value Date   WBC 13.7 (H) 07/19/2018   NEUTROABS 8.7 (H) 07/19/2018   HGB 9.8 (L) 07/19/2018   HCT 33.1 (L) 07/19/2018   MCV 78.8  (L) 07/19/2018   PLT 459 (H) 07/19/2018    Lab Results  Component Value Date   TIBC 429 07/12/2018   FERRITIN 18 07/12/2018   IRONPCTSAT 5 (L) 07/12/2018     STUDIES: No results found.  ASSESSMENT AND PLAN:   Jayliani N Giebler is a 48 y.o. female with pmh of iron deficiency anemia, GERD, gastric bypass surgery in 2020 referred to hematology for iron deficiency anemia.  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  Cannot rule out occult GI bleed. -Not responsive to oral iron.  Patient requesting renewal for oral iron which she received in 2018.  Reports it worked at that time. -Will schedule her for IV Feraheme weekly x 2 doses.  She has previously received in 2019 and tolerated well. -Referral to GI -Will check B12 level next time with her history of gastric bypass surgery.  Orders Placed This Encounter  Procedures   CBC with Differential/Platelet   Iron and TIBC   Ferritin   Vitamin B12   Ambulatory referral to Gastroenterology   RTC in 3 months for MD visit, labs, Feraheme   Patient expressed understanding and was in agreement with this plan. She also understands that She can call clinic at any time with any questions, concerns, or complaints.   I spent a total of 45 minutes reviewing chart data, face-to-face evaluation with the patient, counseling and coordination of care as detailed above.  Michaelyn Barter, MD   10/16/2023 12:49 PM

## 2023-10-16 NOTE — Progress Notes (Signed)
Patient has had iron infusions in the past and re

## 2023-10-19 ENCOUNTER — Inpatient Hospital Stay: Payer: Medicaid Other

## 2023-10-19 VITALS — BP 122/69 | HR 84 | Resp 16

## 2023-10-19 DIAGNOSIS — D509 Iron deficiency anemia, unspecified: Secondary | ICD-10-CM | POA: Diagnosis not present

## 2023-10-19 MED ORDER — FERUMOXYTOL INJECTION 510 MG/17 ML
510.0000 mg | Freq: Once | INTRAVENOUS | Status: AC
  Administered 2023-10-19: 510 mg via INTRAVENOUS
  Filled 2023-10-19: qty 510

## 2023-10-19 MED ORDER — SODIUM CHLORIDE 0.9 % IV SOLN
Freq: Once | INTRAVENOUS | Status: AC
  Filled 2023-10-19: qty 250

## 2023-10-20 ENCOUNTER — Other Ambulatory Visit: Payer: Self-pay

## 2023-10-20 DIAGNOSIS — R7309 Other abnormal glucose: Secondary | ICD-10-CM

## 2023-10-20 MED ORDER — OZEMPIC (0.25 OR 0.5 MG/DOSE) 2 MG/3ML ~~LOC~~ SOPN: 0.2500 mg | PEN_INJECTOR | SUBCUTANEOUS | 0 refills | Status: AC

## 2023-10-26 ENCOUNTER — Inpatient Hospital Stay: Payer: Medicaid Other

## 2023-10-26 VITALS — BP 133/76 | HR 81 | Temp 98.4°F | Resp 18

## 2023-10-26 DIAGNOSIS — D509 Iron deficiency anemia, unspecified: Secondary | ICD-10-CM | POA: Diagnosis not present

## 2023-10-26 MED ORDER — SODIUM CHLORIDE 0.9 % IV SOLN
510.0000 mg | Freq: Once | INTRAVENOUS | Status: AC
  Administered 2023-10-26: 510 mg via INTRAVENOUS
  Filled 2023-10-26: qty 510

## 2023-10-26 MED ORDER — SODIUM CHLORIDE 0.9 % IV SOLN
INTRAVENOUS | Status: DC
  Filled 2023-10-26: qty 250

## 2023-10-26 NOTE — Patient Instructions (Signed)
[facility] - A DEPT OF [facility]  Discharge Instructions: Thank you for choosing [facility] to provide your oncology and hematology care.  If you have a lab appointment with the Cancer Center, please go directly to the Cancer Center and check in at the registration area.  Wear comfortable clothing and clothing appropriate for easy access to any Portacath or PICC line.   We strive to give you quality time with your provider. You may need to reschedule your appointment if you arrive late (15 or more minutes).  Arriving late affects you and other patients whose appointments are after yours.  Also, if you miss three or more appointments without notifying the office, you may be dismissed from the clinic at the provider's discretion.      For prescription refill requests, have your pharmacy contact our office and allow 72 hours for refills to be completed.    Today you received the following chemotherapy and/or immunotherapy agents Feraheme.      To help prevent nausea and vomiting after your treatment, we encourage you to take your nausea medication as directed.  BELOW ARE SYMPTOMS THAT SHOULD BE REPORTED IMMEDIATELY: *FEVER GREATER THAN 100.4 F (38 C) OR HIGHER *CHILLS OR SWEATING *NAUSEA AND VOMITING THAT IS NOT CONTROLLED WITH YOUR NAUSEA MEDICATION *UNUSUAL SHORTNESS OF BREATH *UNUSUAL BRUISING OR BLEEDING *URINARY PROBLEMS (pain or burning when urinating, or frequent urination) *BOWEL PROBLEMS (unusual diarrhea, constipation, pain near the anus) TENDERNESS IN MOUTH AND THROAT WITH OR WITHOUT PRESENCE OF ULCERS (sore throat, sores in mouth, or a toothache) UNUSUAL RASH, SWELLING OR PAIN  UNUSUAL VAGINAL DISCHARGE OR ITCHING   Items with * indicate a potential emergency and should be followed up as soon as possible or go to the Emergency Department if any problems should occur.  Please show the CHEMOTHERAPY ALERT CARD or IMMUNOTHERAPY  ALERT CARD at check-in to the Emergency Department and triage nurse.  Should you have questions after your visit or need to cancel or reschedule your appointment, please contact [facility] - A DEPT OF [facility]  [phone] and follow the prompts.  Office hours are 8:00 a.m. to 4:30 p.m. Monday - Friday. Please note that voicemails left after 4:00 p.m. may not be returned until the following business day.  We are closed weekends and major holidays. You have access to a nurse at all times for urgent questions. Please call the main number to the clinic [phone] and follow the prompts.  For any non-urgent questions, you may also contact your provider using MyChart. We now offer e-Visits for anyone 29 and older to request care online for non-urgent symptoms. For details visit [email or web address].   Also download the MyChart app! Go to the app store, search "MyChart", open the app, select [place], and log in with your MyChart username and password.

## 2023-10-27 ENCOUNTER — Encounter: Payer: Self-pay | Admitting: Family Medicine

## 2023-10-27 ENCOUNTER — Ambulatory Visit (INDEPENDENT_AMBULATORY_CARE_PROVIDER_SITE_OTHER): Payer: Medicaid Other | Admitting: Family Medicine

## 2023-10-27 ENCOUNTER — Ambulatory Visit
Admission: RE | Admit: 2023-10-27 | Discharge: 2023-10-27 | Disposition: A | Discharge status: Home / Self Care | Payer: Medicaid Other | Source: Ambulatory Visit | Attending: Family Medicine | Admitting: Family Medicine

## 2023-10-27 ENCOUNTER — Other Ambulatory Visit: Payer: Self-pay | Admitting: Family Medicine

## 2023-10-27 VITALS — BP 141/83 | HR 88 | Temp 98.1°F | Resp 16 | Ht 66.0 in | Wt 213.6 lb

## 2023-10-27 DIAGNOSIS — K219 Gastro-esophageal reflux disease without esophagitis: Secondary | ICD-10-CM | POA: Diagnosis not present

## 2023-10-27 DIAGNOSIS — N10 Acute tubulo-interstitial nephritis: Secondary | ICD-10-CM | POA: Diagnosis present

## 2023-10-27 DIAGNOSIS — N3941 Urge incontinence: Secondary | ICD-10-CM

## 2023-10-27 DIAGNOSIS — N390 Urinary tract infection, site not specified: Secondary | ICD-10-CM | POA: Insufficient documentation

## 2023-10-27 LAB — POCT URINALYSIS DIP (CLINITEK)
Bilirubin, UA: NEGATIVE
Glucose, UA: NEGATIVE mg/dL
Ketones, POC UA: NEGATIVE mg/dL
Nitrite, UA: POSITIVE — AB
Spec Grav, UA: 1.025 (ref 1.010–1.025)
Urobilinogen, UA: 1 U/dL
pH, UA: 6 (ref 5.0–8.0)

## 2023-10-27 MED ORDER — IOHEXOL 300 MG/ML  SOLN
100.0000 mL | Freq: Once | INTRAMUSCULAR | Status: AC | PRN
  Administered 2023-10-27: 100 mL via INTRAVENOUS

## 2023-10-27 MED ORDER — ESOMEPRAZOLE MAGNESIUM 40 MG PO CPDR: DELAYED_RELEASE_CAPSULE | ORAL | 3 refills | Status: AC

## 2023-10-27 MED ORDER — LEVOFLOXACIN 500 MG PO TABS: 500.0000 mg | ORAL_TABLET | Freq: Every day | ORAL | 0 refills | Status: AC

## 2023-10-27 NOTE — Progress Notes (Signed)
Established Patient Office Visit  Subjective   Patient ID: Lauren [name], female    DOB: [birth year]  Age: 48 y.o. MRN: [id number]  Chief Complaint  Patient presents with   Urinary Tract Infection    HPI 48 year old with type 2 diabetes, status post gastric bypass, iron deficiency secondary to poor absorption, anxiety, Depo-Provera for contraception, migraine headaches, multiple pain syndromes and followed by pain clinic on opiates.  She got an iron infusion yesterday.  Her hemoglobin is 7.6 and her MCV is 73 her iron saturation is 3%. She is not taking Mounjaro because her insurance company did not clear that she would like to be put on Ozempic.  She continues to take metformin.  She had a bladder infection last week and she is prescribed Bactrim double strength.  Her culture and sensitivities show the E. coli should have been sensitive to Bactrim.  But she states there has been no improvement in her symptoms at all.  She still has suprapubic pain and dysuria and frequency.  She has not passed any blood.  Specific gravity of her urine today is 1.025 and last week her specific gravity was 1.030.  She reports she drinks about a gallon of water a day.  She also reports that her back hurts.  She has had no fever or nausea.  She has never had pyelonephritis.    ROS    Objective:     BP (!) 141/83 (BP Location: Left Arm, Patient Position: Sitting, Cuff Size: Large)   Pulse 88   Temp 98.1 F (36.7 C) (Oral)   Resp 16   Ht 5\' 6"  (1.676 m) Comment: per chart  Wt 213 lb 9.6 oz (96.9 kg)   LMP 09/28/2023 (Exact Date)   SpO2 98%   BMI 34.48 kg/m    Physical Exam Vitals and nursing note reviewed.  Constitutional:      Appearance: Normal appearance.  HENT:     Head: Normocephalic and atraumatic.  Eyes:     Conjunctiva/sclera: Conjunctivae normal.  Cardiovascular:     Rate and Rhythm: Normal rate and regular rhythm.  Pulmonary:     Effort: Pulmonary effort is normal.      Breath sounds: Normal breath sounds.  Abdominal:     Tenderness: There is right CVA tenderness and left CVA tenderness.  Musculoskeletal:     Right lower leg: No edema.     Left lower leg: No edema.  Skin:    General: Skin is warm and dry.  Neurological:     Mental Status: She is alert and oriented to person, place, and time.  Psychiatric:        Mood and Affect: Mood normal.        Behavior: Behavior normal.        Thought Content: Thought content normal.        Judgment: Judgment normal.      Results for orders placed or performed in visit on 10/27/23  POCT URINALYSIS DIP (CLINITEK)  Result Value Ref Range   Color, UA yellow yellow   Clarity, UA cloudy (A) clear   Glucose, UA negative negative mg/dL   Bilirubin, UA negative negative   Ketones, POC UA negative negative mg/dL   Spec Grav, UA 1.191 4.782 - 1.025   Blood, UA trace-intact (A) negative   pH, UA 6.0 5.0 - 8.0   POC PROTEIN,UA trace negative, trace   Urobilinogen, UA 1.0 0.2 or 1.0 E.U./dL   Nitrite, UA Positive (A) Negative  Leukocytes, UA Small (1+) (A) Negative      The 10-year ASCVD risk score (Arnett DK, et al., 2019) is: 2.3%    Assessment & Plan:   Problem List Items Addressed This Visit       Digestive   Gastroesophageal reflux disease   Relevant Medications   esomeprazole (NEXIUM) 40 MG capsule     Genitourinary   Urinary tract infection   She has bilateral CVA tenderness but no fever.  Will send for stat CT to check for Pyelonephritis.  If no pyelonephritis then levofloxacin 500 mg daily for 7 days.  Please push fluids.Call for stat read, she has normal kidneys but a thickened bladder.      Relevant Orders   CT ABDOMEN PELVIS W WO CONTRAST (Completed)   Other Visit Diagnoses       Urge incontinence of urine    -  Primary   Relevant Orders   POCT URINALYSIS DIP (CLINITEK) (Completed)   Urine Culture       No follow-ups on file.    [name], MD

## 2023-10-27 NOTE — Assessment & Plan Note (Addendum)
She has bilateral CVA tenderness but no fever.  Will send for stat CT to check for Pyelonephritis.  If no pyelonephritis then levofloxacin 500 mg daily for 7 days.  Please push fluids.Call for stat read, she has normal kidneys but a thickened bladder.

## 2023-10-31 ENCOUNTER — Encounter: Payer: Self-pay | Admitting: Family Medicine

## 2023-10-31 LAB — URINE CULTURE

## 2023-11-16 ENCOUNTER — Other Ambulatory Visit: Payer: Self-pay | Admitting: Family Medicine

## 2023-11-16 DIAGNOSIS — R7309 Other abnormal glucose: Secondary | ICD-10-CM

## 2023-11-20 ENCOUNTER — Other Ambulatory Visit: Payer: Self-pay | Admitting: Family Medicine

## 2023-11-20 ENCOUNTER — Telehealth: Payer: Self-pay

## 2023-11-20 DIAGNOSIS — E119 Type 2 diabetes mellitus without complications: Secondary | ICD-10-CM

## 2023-11-20 MED ORDER — OZEMPIC (1 MG/DOSE) 4 MG/3ML ~~LOC~~ SOPN: 1.0000 mg | PEN_INJECTOR | SUBCUTANEOUS | 0 refills | Status: DC

## 2023-11-20 NOTE — Telephone Encounter (Signed)
 Patient has been informed.

## 2023-11-20 NOTE — Telephone Encounter (Signed)
 Copied from CRM [id number]. Topic: Clinical - Medication Question >> Nov 20, 2023 11:43 AM Lauren [name] wrote: Reason for CRM: Patient needs an increase of dosage for the Ozempic put into the system. Dr. Girtha Rm told the patient she would need to go up when it was time for the refill. Patient states that pharmacy has attempted to reach out.

## 2023-12-20 ENCOUNTER — Other Ambulatory Visit: Payer: Self-pay | Admitting: Family Medicine

## 2023-12-20 DIAGNOSIS — E119 Type 2 diabetes mellitus without complications: Secondary | ICD-10-CM

## 2023-12-21 ENCOUNTER — Other Ambulatory Visit: Payer: Self-pay | Admitting: Family Medicine

## 2023-12-21 DIAGNOSIS — E119 Type 2 diabetes mellitus without complications: Secondary | ICD-10-CM

## 2023-12-21 MED ORDER — OZEMPIC (1 MG/DOSE) 4 MG/3ML ~~LOC~~ SOPN
1.0000 mg | PEN_INJECTOR | SUBCUTANEOUS | 0 refills | Status: DC
Start: 2023-12-21 — End: 2024-05-10

## 2024-01-09 ENCOUNTER — Ambulatory Visit (INDEPENDENT_AMBULATORY_CARE_PROVIDER_SITE_OTHER): Payer: Medicaid Other | Admitting: Family Medicine

## 2024-01-09 ENCOUNTER — Encounter: Payer: Self-pay | Admitting: Family Medicine

## 2024-01-09 VITALS — BP 146/100 | HR 86 | Resp 18 | Ht 66.0 in | Wt 229.0 lb

## 2024-01-09 DIAGNOSIS — E119 Type 2 diabetes mellitus without complications: Secondary | ICD-10-CM

## 2024-01-09 DIAGNOSIS — E559 Vitamin D deficiency, unspecified: Secondary | ICD-10-CM

## 2024-01-09 DIAGNOSIS — E118 Type 2 diabetes mellitus with unspecified complications: Secondary | ICD-10-CM

## 2024-01-09 DIAGNOSIS — I10 Essential (primary) hypertension: Secondary | ICD-10-CM | POA: Diagnosis not present

## 2024-01-09 DIAGNOSIS — J45909 Unspecified asthma, uncomplicated: Secondary | ICD-10-CM

## 2024-01-09 DIAGNOSIS — Z9884 Bariatric surgery status: Secondary | ICD-10-CM

## 2024-01-09 DIAGNOSIS — D509 Iron deficiency anemia, unspecified: Secondary | ICD-10-CM | POA: Diagnosis not present

## 2024-01-09 DIAGNOSIS — E538 Deficiency of other specified B group vitamins: Secondary | ICD-10-CM

## 2024-01-09 LAB — POCT GLYCOSYLATED HEMOGLOBIN (HGB A1C): Hemoglobin A1C: 5.1 % (ref 4.0–5.6)

## 2024-01-09 MED ORDER — FLUTICASONE PROPIONATE HFA 220 MCG/ACT IN AERO: 2.0000 | INHALATION_SPRAY | Freq: Two times a day (BID) | RESPIRATORY_TRACT | 2 refills | Status: AC
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
## 2024-01-10 ENCOUNTER — Other Ambulatory Visit: Payer: Self-pay | Admitting: Family Medicine

## 2024-01-10 DIAGNOSIS — E118 Type 2 diabetes mellitus with unspecified complications: Secondary | ICD-10-CM | POA: Insufficient documentation

## 2024-01-10 DIAGNOSIS — E119 Type 2 diabetes mellitus without complications: Secondary | ICD-10-CM

## 2024-01-10 MED ORDER — TIRZEPATIDE 2.5 MG/0.5ML ~~LOC~~ SOAJ: 2.5000 mg | SUBCUTANEOUS | 0 refills | Status: DC

## 2024-01-10 NOTE — Assessment & Plan Note (Addendum)
 A1c today 5.1%.  She has very well controlled diabetes on Ozempic  1 mg daily but she is not losing weight.  She would like to switch to Mounjaro.  Advised we have to start the beginning with Mounjaro because it is a different molecule.  Stop the Ozempic .  Wait a week and start back on Mounjaro

## 2024-01-10 NOTE — Assessment & Plan Note (Signed)
 She is on amlodipine  5 mg daily, lisinopril  20 mg daily and metoprolol  succinate 100 mg daily.  Blood pressure not ideal today at 146/100 but she does have a headache.  Checking CMP and thyroid  panel.

## 2024-01-10 NOTE — Assessment & Plan Note (Signed)
 Last iron infusion was February 2025.  Checking her iron indices and CBC.

## 2024-01-14 ENCOUNTER — Other Ambulatory Visit: Payer: Self-pay | Admitting: Family Medicine

## 2024-01-14 DIAGNOSIS — E119 Type 2 diabetes mellitus without complications: Secondary | ICD-10-CM

## 2024-01-15 ENCOUNTER — Ambulatory Visit: Payer: Medicaid Other | Admitting: Internal Medicine

## 2024-01-15 ENCOUNTER — Other Ambulatory Visit: Payer: Self-pay | Admitting: Family Medicine

## 2024-01-15 ENCOUNTER — Ambulatory Visit: Payer: Medicaid Other

## 2024-01-15 ENCOUNTER — Inpatient Hospital Stay: Payer: Medicaid Other

## 2024-01-15 ENCOUNTER — Other Ambulatory Visit: Payer: Medicaid Other

## 2024-01-15 ENCOUNTER — Inpatient Hospital Stay: Payer: Medicaid Other | Admitting: Internal Medicine

## 2024-01-15 DIAGNOSIS — E119 Type 2 diabetes mellitus without complications: Secondary | ICD-10-CM

## 2024-01-16 ENCOUNTER — Telehealth: Payer: Self-pay

## 2024-01-16 MED ORDER — TIRZEPATIDE 2.5 MG/0.5ML ~~LOC~~ SOAJ: 2.5000 mg | SUBCUTANEOUS | 0 refills | Status: DC

## 2024-01-16 NOTE — Telephone Encounter (Signed)
-----   Message from Donette Furlong sent at 01/16/2024  9:28 AM EDT ----- Please ask her to get her labs done.  Thanks

## 2024-01-16 NOTE — Telephone Encounter (Signed)
 Patient states she currently dealing with a family matter out of town and will schedule lab appointment once she returns.

## 2024-01-16 NOTE — Telephone Encounter (Signed)
(  Key: [id number])  Mounjaro 2.5MG /0.5ML auto-injectors  Form [employer] Electronic Prior Authorization Form [phone] NCPDP)  BIN # [id number] PCN # [id number] GROUP # [id number]  MEMBER ID # [id number]

## 2024-02-09 ENCOUNTER — Other Ambulatory Visit: Payer: Self-pay | Admitting: *Deleted

## 2024-02-09 DIAGNOSIS — D509 Iron deficiency anemia, unspecified: Secondary | ICD-10-CM

## 2024-02-12 ENCOUNTER — Inpatient Hospital Stay

## 2024-02-12 ENCOUNTER — Inpatient Hospital Stay: Admitting: Internal Medicine

## 2024-02-12 ENCOUNTER — Encounter: Payer: Self-pay | Admitting: Internal Medicine

## 2024-04-01 ENCOUNTER — Ambulatory Visit

## 2024-04-01 VITALS — BP 109/52 | Ht 64.0 in | Wt 212.5 lb

## 2024-04-01 DIAGNOSIS — Z30012 Encounter for prescription of emergency contraception: Secondary | ICD-10-CM

## 2024-04-01 DIAGNOSIS — Z309 Encounter for contraceptive management, unspecified: Secondary | ICD-10-CM | POA: Diagnosis not present

## 2024-04-01 MED ORDER — ELLA 30 MG PO TABS: 1.0000 | ORAL_TABLET | Freq: Once | ORAL | Status: DC

## 2024-04-01 NOTE — Progress Notes (Signed)
 In nurse clinic for emergency contraception today. Order placed for Ella  per S.O. consulted with Dr. Macario Rx to be sent to pharmacy.

## 2024-04-02 ENCOUNTER — Telehealth: Payer: Self-pay | Admitting: Family Medicine

## 2024-04-02 ENCOUNTER — Other Ambulatory Visit: Payer: Self-pay

## 2024-04-02 MED ORDER — ELLA 30 MG PO TABS: 1.0000 | ORAL_TABLET | Freq: Once | ORAL | 0 refills | Status: DC

## 2024-04-02 MED ORDER — ELLA 30 MG PO TABS: 1.0000 | ORAL_TABLET | Freq: Once | ORAL | Status: DC

## 2024-04-02 MED ORDER — ELLA 30 MG PO TABS: 1.0000 | ORAL_TABLET | Freq: Once | ORAL | 0 refills | Status: AC

## 2024-04-02 NOTE — Addendum Note (Signed)
 Addended byBETHA ANDRIA GIVENS on: 04/02/2024 03:56 PM   Modules accepted: Orders

## 2024-04-02 NOTE — Progress Notes (Signed)
 Received phone call from pt stated that pharmacy had not received order. PC made to [facility] [address] and medication called in Called patient to advise Medication called in.

## 2024-04-02 NOTE — Addendum Note (Signed)
 Addended by: Millie Shorb M on: 04/02/2024 04:08 PM   Modules accepted: Orders

## 2024-04-02 NOTE — Telephone Encounter (Signed)
 Patient is calling to see if RX has been sent in.

## 2024-04-21 ENCOUNTER — Other Ambulatory Visit: Payer: Self-pay | Admitting: Family Medicine

## 2024-04-21 DIAGNOSIS — E119 Type 2 diabetes mellitus without complications: Secondary | ICD-10-CM

## 2024-05-10 ENCOUNTER — Encounter: Payer: Self-pay | Admitting: Family Medicine

## 2024-05-10 ENCOUNTER — Ambulatory Visit: Admitting: Family Medicine

## 2024-05-10 VITALS — Resp 16 | Ht 64.0 in | Wt 216.0 lb

## 2024-05-10 DIAGNOSIS — E611 Iron deficiency: Secondary | ICD-10-CM | POA: Diagnosis not present

## 2024-05-10 DIAGNOSIS — E118 Type 2 diabetes mellitus with unspecified complications: Secondary | ICD-10-CM | POA: Diagnosis not present

## 2024-05-10 DIAGNOSIS — R300 Dysuria: Secondary | ICD-10-CM

## 2024-05-10 DIAGNOSIS — I1A0 Resistant hypertension: Secondary | ICD-10-CM

## 2024-05-10 DIAGNOSIS — D509 Iron deficiency anemia, unspecified: Secondary | ICD-10-CM

## 2024-05-10 DIAGNOSIS — Z7984 Long term (current) use of oral hypoglycemic drugs: Secondary | ICD-10-CM

## 2024-05-10 LAB — POCT URINALYSIS DIPSTICK
Bilirubin, UA: NEGATIVE
Blood, UA: NEGATIVE
Glucose, UA: NEGATIVE
Ketones, UA: NEGATIVE
Leukocytes, UA: NEGATIVE
Nitrite, UA: NEGATIVE
Protein, UA: NEGATIVE
Spec Grav, UA: 1.015 (ref 1.010–1.025)
Urobilinogen, UA: 0.2 U/dL
pH, UA: 7 (ref 5.0–8.0)

## 2024-05-10 MED ORDER — TIRZEPATIDE 5 MG/0.5ML ~~LOC~~ SOAJ: 5.0000 mg | SUBCUTANEOUS | 2 refills | Status: AC

## 2024-05-10 MED ORDER — TIRZEPATIDE 2.5 MG/0.5ML ~~LOC~~ SOAJ: 2.5000 mg | SUBCUTANEOUS | 0 refills | Status: AC
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
## 2024-05-13 DIAGNOSIS — R300 Dysuria: Secondary | ICD-10-CM | POA: Insufficient documentation

## 2024-05-13 NOTE — Assessment & Plan Note (Signed)
 She has gotten an iron infusion in the past.  Checking her iron levels.

## 2024-05-13 NOTE — Assessment & Plan Note (Signed)
 Urinalysis is negative nitrate negative leukocyte esterase but specific gravity is greater than 1.030.  Advised that her urine is concentrated and probably irritating her bladder.  Needs to drink more water and think the symptoms will resolve.

## 2024-05-13 NOTE — Assessment & Plan Note (Signed)
 Good control of her HTN.  On lisinopril  20mg , diltiazem  180mg , norvasc  5mg  and metoprolol  succinate 100mg 

## 2024-05-13 NOTE — Assessment & Plan Note (Signed)
 She is on Mounjaro  ask her to stay on 2.5 mg for a month because she has missed a couple injections.  Then after a month go up to Mounjaro  5 mg and stay there for 2 months please.  Follow-up in 3 months

## 2024-05-14 ENCOUNTER — Other Ambulatory Visit: Payer: Self-pay | Admitting: Family Medicine

## 2024-05-14 ENCOUNTER — Encounter: Payer: Self-pay | Admitting: Family Medicine

## 2024-05-14 DIAGNOSIS — Z308 Encounter for other contraceptive management: Secondary | ICD-10-CM

## 2024-05-14 MED ORDER — NORELGESTROMIN-ETH ESTRADIOL 150-35 MCG/24HR TD PTWK: 1.0000 | MEDICATED_PATCH | TRANSDERMAL | 12 refills | Status: AC

## 2024-10-01 ENCOUNTER — Telehealth: Payer: Self-pay | Admitting: Pharmacy Technician

## 2024-10-01 NOTE — Telephone Encounter (Signed)
 Pharmacy Patient Advocate Encounter   Received notification from [facility] CMM KEY that prior authorization for Ozempic  (0.25 or 0.5 MG/DOSE) 2MG /3ML pen-injectors  is due for renewal.   Insurance verification completed.   The patient is insured through [facility] MEDICAID.  Action: Medication has been discontinued. Archived Key: [id number]
